# Patient Record
Sex: Female | Born: 1955 | Race: White | Hispanic: No | Smoking: Current every day smoker
Health system: Southern US, Community
[De-identification: ages and names within clinical notes are randomized; demographics above are authoritative.]

## PROBLEM LIST (undated history)

## (undated) DIAGNOSIS — K219 Gastro-esophageal reflux disease without esophagitis: Secondary | ICD-10-CM

## (undated) DIAGNOSIS — F419 Anxiety disorder, unspecified: Secondary | ICD-10-CM

## (undated) DIAGNOSIS — M810 Age-related osteoporosis without current pathological fracture: Secondary | ICD-10-CM

## (undated) DIAGNOSIS — D472 Monoclonal gammopathy: Secondary | ICD-10-CM

## (undated) DIAGNOSIS — J449 Chronic obstructive pulmonary disease, unspecified: Secondary | ICD-10-CM

## (undated) DIAGNOSIS — K8020 Calculus of gallbladder without cholecystitis without obstruction: Secondary | ICD-10-CM

## (undated) DIAGNOSIS — C801 Malignant (primary) neoplasm, unspecified: Secondary | ICD-10-CM

## (undated) HISTORY — DX: Calculus of gallbladder without cholecystitis without obstruction: K80.20

## (undated) HISTORY — DX: Monoclonal gammopathy: D47.2

## (undated) HISTORY — PX: APPENDECTOMY: SHX54

## (undated) HISTORY — DX: Anxiety disorder, unspecified: F41.9

## (undated) HISTORY — PX: ABDOMINAL HYSTERECTOMY: SHX81

## (undated) HISTORY — DX: Age-related osteoporosis without current pathological fracture: M81.0

---

## 2000-06-15 ENCOUNTER — Ambulatory Visit (HOSPITAL_COMMUNITY): Admission: RE | Admit: 2000-06-15 | Discharge: 2000-06-15 | Payer: Self-pay | Admitting: *Deleted

## 2000-06-15 ENCOUNTER — Encounter: Payer: Self-pay | Admitting: *Deleted

## 2000-10-30 ENCOUNTER — Emergency Department (HOSPITAL_COMMUNITY): Admission: EM | Admit: 2000-10-30 | Discharge: 2000-10-30 | Payer: Self-pay | Admitting: Internal Medicine

## 2000-11-21 ENCOUNTER — Encounter: Payer: Self-pay | Admitting: Obstetrics and Gynecology

## 2000-11-21 ENCOUNTER — Ambulatory Visit (HOSPITAL_COMMUNITY): Admission: RE | Admit: 2000-11-21 | Discharge: 2000-11-21 | Payer: Self-pay | Admitting: Obstetrics and Gynecology

## 2000-11-21 ENCOUNTER — Other Ambulatory Visit: Admission: RE | Admit: 2000-11-21 | Discharge: 2000-11-21 | Payer: Self-pay | Admitting: Obstetrics and Gynecology

## 2001-07-30 ENCOUNTER — Inpatient Hospital Stay (HOSPITAL_COMMUNITY): Admission: EM | Admit: 2001-07-30 | Discharge: 2001-08-01 | Payer: Self-pay | Admitting: Emergency Medicine

## 2001-07-30 ENCOUNTER — Encounter: Payer: Self-pay | Admitting: Emergency Medicine

## 2001-07-31 ENCOUNTER — Encounter: Payer: Self-pay | Admitting: General Surgery

## 2002-06-25 ENCOUNTER — Encounter: Payer: Self-pay | Admitting: Internal Medicine

## 2002-06-25 ENCOUNTER — Ambulatory Visit (HOSPITAL_COMMUNITY): Admission: RE | Admit: 2002-06-25 | Discharge: 2002-06-25 | Payer: Self-pay | Admitting: Internal Medicine

## 2002-08-27 ENCOUNTER — Encounter: Payer: Self-pay | Admitting: Internal Medicine

## 2002-08-27 ENCOUNTER — Ambulatory Visit (HOSPITAL_COMMUNITY): Admission: RE | Admit: 2002-08-27 | Discharge: 2002-08-27 | Payer: Self-pay | Admitting: Internal Medicine

## 2003-06-25 ENCOUNTER — Ambulatory Visit (HOSPITAL_COMMUNITY): Admission: RE | Admit: 2003-06-25 | Discharge: 2003-06-25 | Payer: Self-pay | Admitting: Internal Medicine

## 2003-09-27 ENCOUNTER — Ambulatory Visit (HOSPITAL_COMMUNITY): Admission: RE | Admit: 2003-09-27 | Discharge: 2003-09-27 | Payer: Self-pay | Admitting: Family Medicine

## 2003-10-01 ENCOUNTER — Ambulatory Visit (HOSPITAL_COMMUNITY): Admission: RE | Admit: 2003-10-01 | Discharge: 2003-10-01 | Payer: Self-pay | Admitting: Family Medicine

## 2004-03-05 ENCOUNTER — Ambulatory Visit (HOSPITAL_COMMUNITY): Admission: RE | Admit: 2004-03-05 | Discharge: 2004-03-05 | Payer: Self-pay | Admitting: Family Medicine

## 2004-05-07 ENCOUNTER — Ambulatory Visit (HOSPITAL_COMMUNITY): Admission: RE | Admit: 2004-05-07 | Discharge: 2004-05-07 | Payer: Self-pay | Admitting: Family Medicine

## 2004-06-19 ENCOUNTER — Emergency Department (HOSPITAL_COMMUNITY): Admission: EM | Admit: 2004-06-19 | Discharge: 2004-06-20 | Payer: Self-pay | Admitting: *Deleted

## 2004-07-03 ENCOUNTER — Ambulatory Visit: Payer: Self-pay | Admitting: *Deleted

## 2004-07-08 ENCOUNTER — Ambulatory Visit (HOSPITAL_COMMUNITY): Admission: RE | Admit: 2004-07-08 | Discharge: 2004-07-08 | Payer: Self-pay | Admitting: *Deleted

## 2004-07-08 ENCOUNTER — Ambulatory Visit: Payer: Self-pay | Admitting: *Deleted

## 2004-07-14 ENCOUNTER — Ambulatory Visit: Payer: Self-pay | Admitting: *Deleted

## 2005-02-25 ENCOUNTER — Ambulatory Visit (HOSPITAL_COMMUNITY): Admission: RE | Admit: 2005-02-25 | Discharge: 2005-02-25 | Payer: Self-pay | Admitting: Family Medicine

## 2005-03-14 ENCOUNTER — Emergency Department (HOSPITAL_COMMUNITY): Admission: EM | Admit: 2005-03-14 | Discharge: 2005-03-14 | Payer: Self-pay | Admitting: Emergency Medicine

## 2005-05-05 ENCOUNTER — Ambulatory Visit (HOSPITAL_COMMUNITY): Admission: RE | Admit: 2005-05-05 | Discharge: 2005-05-05 | Payer: Self-pay | Admitting: Family Medicine

## 2005-08-17 ENCOUNTER — Emergency Department (HOSPITAL_COMMUNITY): Admission: EM | Admit: 2005-08-17 | Discharge: 2005-08-17 | Payer: Self-pay | Admitting: Emergency Medicine

## 2005-12-28 ENCOUNTER — Ambulatory Visit (HOSPITAL_COMMUNITY): Admission: RE | Admit: 2005-12-28 | Discharge: 2005-12-28 | Payer: Self-pay | Admitting: Family Medicine

## 2006-02-09 ENCOUNTER — Ambulatory Visit (HOSPITAL_COMMUNITY): Admission: RE | Admit: 2006-02-09 | Discharge: 2006-02-09 | Payer: Self-pay | Admitting: Family Medicine

## 2006-04-18 ENCOUNTER — Ambulatory Visit (HOSPITAL_COMMUNITY): Admission: RE | Admit: 2006-04-18 | Discharge: 2006-04-18 | Payer: Self-pay | Admitting: Family Medicine

## 2006-05-05 ENCOUNTER — Ambulatory Visit (HOSPITAL_COMMUNITY): Admission: RE | Admit: 2006-05-05 | Discharge: 2006-05-05 | Payer: Self-pay | Admitting: Family Medicine

## 2006-09-14 ENCOUNTER — Ambulatory Visit (HOSPITAL_COMMUNITY): Admission: RE | Admit: 2006-09-14 | Discharge: 2006-09-14 | Payer: Self-pay | Admitting: Family Medicine

## 2006-09-19 ENCOUNTER — Emergency Department (HOSPITAL_COMMUNITY): Admission: EM | Admit: 2006-09-19 | Discharge: 2006-09-19 | Payer: Self-pay | Admitting: Emergency Medicine

## 2007-03-16 ENCOUNTER — Ambulatory Visit (HOSPITAL_COMMUNITY): Admission: RE | Admit: 2007-03-16 | Discharge: 2007-03-16 | Payer: Self-pay | Admitting: Internal Medicine

## 2007-03-27 ENCOUNTER — Ambulatory Visit (HOSPITAL_COMMUNITY): Admission: RE | Admit: 2007-03-27 | Discharge: 2007-03-27 | Payer: Self-pay | Admitting: Internal Medicine

## 2007-07-27 ENCOUNTER — Ambulatory Visit (HOSPITAL_COMMUNITY): Admission: RE | Admit: 2007-07-27 | Discharge: 2007-07-27 | Payer: Self-pay | Admitting: Family Medicine

## 2007-12-11 ENCOUNTER — Ambulatory Visit (HOSPITAL_COMMUNITY): Admission: RE | Admit: 2007-12-11 | Discharge: 2007-12-11 | Payer: Self-pay | Admitting: Family Medicine

## 2008-07-05 ENCOUNTER — Ambulatory Visit (HOSPITAL_COMMUNITY): Admission: RE | Admit: 2008-07-05 | Discharge: 2008-07-05 | Payer: Self-pay | Admitting: Family Medicine

## 2008-08-26 ENCOUNTER — Encounter (HOSPITAL_COMMUNITY): Admission: RE | Admit: 2008-08-26 | Discharge: 2008-09-25 | Payer: Self-pay | Admitting: Oncology

## 2008-08-26 ENCOUNTER — Ambulatory Visit (HOSPITAL_COMMUNITY): Payer: Self-pay | Admitting: Oncology

## 2008-08-27 ENCOUNTER — Encounter (HOSPITAL_COMMUNITY): Payer: Self-pay | Admitting: Oncology

## 2008-08-28 ENCOUNTER — Ambulatory Visit (HOSPITAL_COMMUNITY): Admission: RE | Admit: 2008-08-28 | Discharge: 2008-08-28 | Payer: Self-pay | Admitting: Oncology

## 2008-09-05 ENCOUNTER — Ambulatory Visit (HOSPITAL_COMMUNITY): Admission: RE | Admit: 2008-09-05 | Discharge: 2008-09-05 | Payer: Self-pay | Admitting: Oncology

## 2009-02-24 ENCOUNTER — Ambulatory Visit (HOSPITAL_COMMUNITY): Payer: Self-pay | Admitting: Oncology

## 2009-02-24 ENCOUNTER — Encounter (HOSPITAL_COMMUNITY): Admission: RE | Admit: 2009-02-24 | Discharge: 2009-03-26 | Payer: Self-pay | Admitting: Oncology

## 2009-03-02 ENCOUNTER — Emergency Department (HOSPITAL_COMMUNITY): Admission: EM | Admit: 2009-03-02 | Discharge: 2009-03-02 | Payer: Self-pay | Admitting: Emergency Medicine

## 2009-05-12 ENCOUNTER — Emergency Department (HOSPITAL_COMMUNITY): Admission: EM | Admit: 2009-05-12 | Discharge: 2009-05-12 | Payer: Self-pay | Admitting: Emergency Medicine

## 2009-06-19 ENCOUNTER — Emergency Department (HOSPITAL_COMMUNITY): Admission: EM | Admit: 2009-06-19 | Discharge: 2009-06-19 | Payer: Self-pay | Admitting: Emergency Medicine

## 2009-07-04 ENCOUNTER — Emergency Department (HOSPITAL_COMMUNITY): Admission: EM | Admit: 2009-07-04 | Discharge: 2009-07-04 | Payer: Self-pay | Admitting: Emergency Medicine

## 2009-11-16 ENCOUNTER — Emergency Department (HOSPITAL_COMMUNITY): Admission: EM | Admit: 2009-11-16 | Discharge: 2009-11-16 | Payer: Self-pay | Admitting: Emergency Medicine

## 2009-11-28 ENCOUNTER — Encounter (HOSPITAL_COMMUNITY)
Admission: RE | Admit: 2009-11-28 | Discharge: 2009-12-28 | Payer: Self-pay | Source: Home / Self Care | Attending: Oncology | Admitting: Oncology

## 2009-11-28 ENCOUNTER — Ambulatory Visit (HOSPITAL_COMMUNITY): Payer: Self-pay | Admitting: Oncology

## 2010-03-31 LAB — PROTEIN ELECTROPH W RFLX QUANT IMMUNOGLOBULINS
M-Spike, %: 1.51 g/dL
Total Protein ELP: 7.8 g/dL (ref 6.0–8.3)

## 2010-03-31 LAB — UIFE/LIGHT CHAINS/TP QN, 24-HR UR
Alpha 1, Urine: DETECTED — AB
Alpha 2, Urine: DETECTED — AB
Free Kappa/Lambda Ratio: 16.7 ratio — ABNORMAL HIGH (ref 0.46–4.00)
Total Protein, Urine: 2.3 mg/dL
Volume, Urine: 850 mL

## 2010-03-31 LAB — IGG, IGA, IGM
IgA: 57 mg/dL — ABNORMAL LOW (ref 68–378)
IgM, Serum: 52 mg/dL — ABNORMAL LOW (ref 60–263)

## 2010-03-31 LAB — CBC
MCH: 31.9 pg (ref 26.0–34.0)
MCHC: 34.2 g/dL (ref 30.0–36.0)
MCV: 93.3 fL (ref 78.0–100.0)
Platelets: 346 10*3/uL (ref 150–400)

## 2010-03-31 LAB — DIFFERENTIAL
Basophils Relative: 1 % (ref 0–1)
Eosinophils Absolute: 0.4 10*3/uL (ref 0.0–0.7)
Lymphocytes Relative: 33 % (ref 12–46)
Lymphs Abs: 3 10*3/uL (ref 0.7–4.0)
Monocytes Absolute: 0.5 10*3/uL (ref 0.1–1.0)
Neutro Abs: 5.1 10*3/uL (ref 1.7–7.7)

## 2010-03-31 LAB — COMPREHENSIVE METABOLIC PANEL
AST: 22 U/L (ref 0–37)
Albumin: 3.8 g/dL (ref 3.5–5.2)
BUN: 11 mg/dL (ref 6–23)
Calcium: 9 mg/dL (ref 8.4–10.5)
Chloride: 106 mEq/L (ref 96–112)
Creatinine, Ser: 0.76 mg/dL (ref 0.4–1.2)
GFR calc Af Amer: >60 mL/min (ref >60)
GFR calc non Af Amer: >60 mL/min (ref >60)
Total Bilirubin: 0.6 mg/dL (ref 0.3–1.2)

## 2010-03-31 LAB — IMMUNOFIXATION ELECTROPHORESIS
IgA: 58 mg/dL — ABNORMAL LOW (ref 68–378)
IgG (Immunoglobin G), Serum: 2060 mg/dL — ABNORMAL HIGH (ref 694–1618)
IgM, Serum: 47 mg/dL — ABNORMAL LOW (ref 60–263)
Total Protein ELP: 7.9 g/dL (ref 6.0–8.3)

## 2010-03-31 LAB — IGG: IgG (Immunoglobin G), Serum: 1920 mg/dL — ABNORMAL HIGH (ref 694–1618)

## 2010-04-05 LAB — BASIC METABOLIC PANEL
BUN: 8 mg/dL (ref 6–23)
Calcium: 9.7 mg/dL (ref 8.4–10.5)
GFR calc non Af Amer: >60 mL/min (ref >60)
Potassium: 3.4 mEq/L — ABNORMAL LOW (ref 3.5–5.1)
Sodium: 138 mEq/L (ref 135–145)

## 2010-04-05 LAB — CBC
HCT: 43.4 % (ref 36.0–46.0)
Hemoglobin: 14.6 g/dL (ref 12.0–15.0)
Platelets: 322 10*3/uL (ref 150–400)
WBC: 12.2 10*3/uL — ABNORMAL HIGH (ref 4.0–10.5)

## 2010-04-05 LAB — DIFFERENTIAL
Eosinophils Relative: 7 % — ABNORMAL HIGH (ref 0–5)
Lymphocytes Relative: 31 % (ref 12–46)
Lymphs Abs: 3.8 10*3/uL (ref 0.7–4.0)
Neutro Abs: 6.6 10*3/uL (ref 1.7–7.7)

## 2010-04-07 LAB — COMPREHENSIVE METABOLIC PANEL
Albumin: 3.4 g/dL — ABNORMAL LOW (ref 3.5–5.2)
Alkaline Phosphatase: 89 U/L (ref 39–117)
BUN: 7 mg/dL (ref 6–23)
CO2: 25 mEq/L (ref 19–32)
Chloride: 99 mEq/L (ref 96–112)
Creatinine, Ser: 0.68 mg/dL (ref 0.4–1.2)
GFR calc non Af Amer: >60 mL/min (ref >60)
Glucose, Bld: 93 mg/dL (ref 70–99)
Potassium: 3.5 mEq/L (ref 3.5–5.1)
Total Bilirubin: 0.5 mg/dL (ref 0.3–1.2)

## 2010-04-07 LAB — CULTURE, BLOOD (ROUTINE X 2)
Culture: NO GROWTH
Report Status: 4302011

## 2010-04-07 LAB — POCT CARDIAC MARKERS
CKMB, poc: <1 ng/mL — ABNORMAL LOW (ref 1.0–8.0)
Myoglobin, poc: 27.5 ng/mL (ref 12–200)
Troponin i, poc: <0.05 ng/mL (ref 0.00–0.09)

## 2010-04-07 LAB — CBC
HCT: 38.8 % (ref 36.0–46.0)
Hemoglobin: 13.8 g/dL (ref 12.0–15.0)
MCV: 93.8 fL (ref 78.0–100.0)
Platelets: 251 10*3/uL (ref 150–400)
WBC: 14.1 10*3/uL — ABNORMAL HIGH (ref 4.0–10.5)

## 2010-04-07 LAB — DIFFERENTIAL
Basophils Absolute: 0.1 10*3/uL (ref 0.0–0.1)
Basophils Relative: 1 % (ref 0–1)
Lymphocytes Relative: 14 % (ref 12–46)
Neutro Abs: 11.1 10*3/uL — ABNORMAL HIGH (ref 1.7–7.7)
Neutrophils Relative %: 78 % — ABNORMAL HIGH (ref 43–77)

## 2010-04-08 LAB — BASIC METABOLIC PANEL
Calcium: 9.1 mg/dL (ref 8.4–10.5)
GFR calc Af Amer: >60 mL/min (ref >60)
GFR calc non Af Amer: >60 mL/min (ref >60)
Glucose, Bld: 95 mg/dL (ref 70–99)
Potassium: 3.7 mEq/L (ref 3.5–5.1)
Sodium: 138 mEq/L (ref 135–145)

## 2010-04-08 LAB — DIFFERENTIAL
Basophils Absolute: 0.1 10*3/uL (ref 0.0–0.1)
Basophils Absolute: 0.1 10*3/uL (ref 0.0–0.1)
Basophils Relative: 1 % (ref 0–1)
Eosinophils Absolute: 0.7 10*3/uL (ref 0.0–0.7)
Eosinophils Relative: 7 % — ABNORMAL HIGH (ref 0–5)
Lymphocytes Relative: 29 % (ref 12–46)
Lymphs Abs: 2.7 10*3/uL (ref 0.7–4.0)
Monocytes Absolute: 0.4 10*3/uL (ref 0.1–1.0)
Monocytes Absolute: 0.6 10*3/uL (ref 0.1–1.0)
Monocytes Relative: 7 % (ref 3–12)
Monocytes Relative: 7 % (ref 3–12)
Neutro Abs: 2.5 10*3/uL (ref 1.7–7.7)
Neutro Abs: 5.2 10*3/uL (ref 1.7–7.7)
Neutrophils Relative %: 39 % — ABNORMAL LOW (ref 43–77)

## 2010-04-08 LAB — CBC
HCT: 39 % (ref 36.0–46.0)
HCT: 40 % (ref 36.0–46.0)
Hemoglobin: 13.4 g/dL (ref 12.0–15.0)
Hemoglobin: 13.6 g/dL (ref 12.0–15.0)
RBC: 4.14 MIL/uL (ref 3.87–5.11)
RBC: 4.21 MIL/uL (ref 3.87–5.11)
RDW: 14 % (ref 11.5–15.5)
WBC: 6.5 10*3/uL (ref 4.0–10.5)
WBC: 9.3 10*3/uL (ref 4.0–10.5)

## 2010-04-08 LAB — IMMUNOFIXATION ADD-ON

## 2010-04-08 LAB — IMMUNOFIXATION ELECTROPHORESIS: IgA: 62 mg/dL — ABNORMAL LOW (ref 68–378)

## 2010-04-08 LAB — COMPREHENSIVE METABOLIC PANEL
ALT: 15 U/L (ref 0–35)
Albumin: 3.8 g/dL (ref 3.5–5.2)
Alkaline Phosphatase: 72 U/L (ref 39–117)
BUN: 11 mg/dL (ref 6–23)
Chloride: 103 mEq/L (ref 96–112)
Glucose, Bld: 77 mg/dL (ref 70–99)
Potassium: 4 mEq/L (ref 3.5–5.1)
Sodium: 138 mEq/L (ref 135–145)
Total Bilirubin: 0.6 mg/dL (ref 0.3–1.2)

## 2010-04-08 LAB — IGG, IGA, IGM
IgG (Immunoglobin G), Serum: 1920 mg/dL — ABNORMAL HIGH (ref 694–1618)
IgM, Serum: 49 mg/dL — ABNORMAL LOW (ref 60–263)

## 2010-04-08 LAB — PROTEIN ELECTROPH W RFLX QUANT IMMUNOGLOBULINS
Alpha-1-Globulin: 4.6 % (ref 2.9–4.9)
Beta 2: 3.2 % (ref 3.2–6.5)
Gamma Globulin: 22.5 % — ABNORMAL HIGH (ref 11.1–18.8)
M-Spike, %: 1.38 g/dL

## 2010-04-25 LAB — COMPREHENSIVE METABOLIC PANEL WITH GFR
ALT: 14 U/L (ref 0–35)
AST: 19 U/L (ref 0–37)
Albumin: 3.8 g/dL (ref 3.5–5.2)
Alkaline Phosphatase: 68 U/L (ref 39–117)
BUN: 8 mg/dL (ref 6–23)
CO2: 25 meq/L (ref 19–32)
Calcium: 9.4 mg/dL (ref 8.4–10.5)
Chloride: 107 meq/L (ref 96–112)
Creatinine, Ser: 0.61 mg/dL (ref 0.4–1.2)
GFR calc Af Amer: >60 mL/min (ref >60)
GFR calc non Af Amer: >60 mL/min (ref >60)
Glucose, Bld: 82 mg/dL (ref 70–99)
Potassium: 3.5 meq/L (ref 3.5–5.1)
Sodium: 137 meq/L (ref 135–145)
Total Bilirubin: 0.4 mg/dL (ref 0.3–1.2)
Total Protein: 7.5 g/dL (ref 6.0–8.3)

## 2010-04-25 LAB — PROTEIN ELECTROPH W RFLX QUANT IMMUNOGLOBULINS
Albumin ELP: 55.6 % — ABNORMAL LOW (ref 55.8–66.1)
Alpha-1-Globulin: 4.4 % (ref 2.9–4.9)
Alpha-2-Globulin: 9.3 % (ref 7.1–11.8)
Total Protein ELP: 7.3 g/dL (ref 6.0–8.3)

## 2010-04-25 LAB — CREATININE CLEARANCE, URINE, 24 HOUR
Collection Interval-CRCL: 24 h
Creatinine Clearance: 109 mL/min (ref 75–115)
Creatinine, 24H Ur: 962 mg/d (ref 700–1800)
Creatinine, Urine: 120.21 mg/dL
Creatinine: 0.61 mg/dL (ref 0.4–1.2)
Urine Total Volume-CRCL: 800 mL

## 2010-04-25 LAB — IMMUNOFIXATION ELECTROPHORESIS
IgA: 57 mg/dL — ABNORMAL LOW (ref 68–378)
IgM, Serum: 52 mg/dL — ABNORMAL LOW (ref 60–263)

## 2010-04-25 LAB — UIFE/LIGHT CHAINS/TP QN, 24-HR UR
Albumin, U: DETECTED
Free Kappa Lt Chains,Ur: 0.81 mg/dL (ref 0.04–1.51)
Free Lambda Excretion/Day: 0.88 mg/d
Free Lambda Lt Chains,Ur: 0.11 mg/dL (ref 0.08–1.01)
Free Lt Chn Excr Rate: 6.48 mg/d
Gamma Globulin, Urine: DETECTED — AB
Time: 24 hours
Total Protein, Urine-Ur/day: 11 mg/d (ref 10–140)

## 2010-04-25 LAB — BETA 2 MICROGLOBULIN, SERUM: Beta-2 Microglobulin: 2.3 mg/L — ABNORMAL HIGH (ref 1.01–1.73)

## 2010-04-25 LAB — DIFFERENTIAL
Basophils Absolute: 0.1 10*3/uL (ref 0.0–0.1)
Basophils Relative: 1 % (ref 0–1)
Eosinophils Absolute: 0.7 10*3/uL (ref 0.0–0.7)
Monocytes Absolute: 0.4 10*3/uL (ref 0.1–1.0)
Monocytes Relative: 6 % (ref 3–12)

## 2010-04-25 LAB — CBC
HCT: 39.4 % (ref 36.0–46.0)
Hemoglobin: 14 g/dL (ref 12.0–15.0)
MCHC: 35.5 g/dL (ref 30.0–36.0)
MCV: 92.6 fL (ref 78.0–100.0)
Platelets: 231 K/uL (ref 150–400)
RBC: 4.26 MIL/uL (ref 3.87–5.11)
RDW: 13.8 % (ref 11.5–15.5)
WBC: 5.9 K/uL (ref 4.0–10.5)

## 2010-04-25 LAB — IGG, IGA, IGM
IgA: 56 mg/dL — ABNORMAL LOW (ref 68–378)
IgG (Immunoglobin G), Serum: 1990 mg/dL — ABNORMAL HIGH (ref 694–1618)
IgM, Serum: 51 mg/dL — ABNORMAL LOW (ref 60–263)

## 2010-04-25 LAB — TISSUE HYBRIDIZATION (BONE MARROW)-NCBH

## 2010-04-25 LAB — BONE MARROW EXAM

## 2010-05-07 ENCOUNTER — Other Ambulatory Visit (HOSPITAL_COMMUNITY): Payer: Self-pay | Admitting: Internal Medicine

## 2010-05-07 ENCOUNTER — Encounter (HOSPITAL_COMMUNITY): Payer: Self-pay

## 2010-05-07 ENCOUNTER — Ambulatory Visit (HOSPITAL_COMMUNITY)
Admission: RE | Admit: 2010-05-07 | Discharge: 2010-05-07 | Disposition: A | Discharge status: Home / Self Care | Payer: PRIVATE HEALTH INSURANCE | Source: Ambulatory Visit | Attending: Internal Medicine | Admitting: Internal Medicine

## 2010-05-07 DIAGNOSIS — J069 Acute upper respiratory infection, unspecified: Secondary | ICD-10-CM

## 2010-05-07 DIAGNOSIS — R05 Cough: Secondary | ICD-10-CM

## 2010-05-07 DIAGNOSIS — J4489 Other specified chronic obstructive pulmonary disease: Secondary | ICD-10-CM | POA: Insufficient documentation

## 2010-05-07 DIAGNOSIS — J449 Chronic obstructive pulmonary disease, unspecified: Secondary | ICD-10-CM | POA: Insufficient documentation

## 2010-05-07 DIAGNOSIS — R059 Cough, unspecified: Secondary | ICD-10-CM

## 2010-05-07 DIAGNOSIS — F17200 Nicotine dependence, unspecified, uncomplicated: Secondary | ICD-10-CM | POA: Insufficient documentation

## 2010-05-07 HISTORY — DX: Chronic obstructive pulmonary disease, unspecified: J44.9

## 2010-06-05 NOTE — Procedures (Signed)
NAMENEIMA, LACROSS              ACCOUNT NO.:  [id number]   MEDICAL RECORD NO.:  [id number]          PATIENT TYPE:  OUT   LOCATION:  RESP                          FACILITY:  [facility]   PHYSICIAN:  Edward L. Juanetta Gosling, M.D.DATE OF BIRTH:  March 19, 1955   DATE OF PROCEDURE:  DATE OF DISCHARGE:                            PULMONARY FUNCTION TEST   IMPRESSION:  1. Spirometry shows no definite ventilatory defect, but evidence of      airflow obstruction.  2. Lung volumes show air-trapping.  3. Diffusion capacity of carbon monoxide is normal.  4. Arterial blood gases are normal.  5. There is no significant bronchodilator effect.  6. This is consistent with the clinical diagnosis of chronic      bronchitis/chronic obstructive pulmonary disease.      Edward L. Juanetta Gosling, M.D.  Electronically Signed     ELH/MEDQ  D:  05/05/2006  T:  05/06/2006  Job:  [id number]   cc:   Jeoffrey Massed, MD  Fax: [phone]

## 2010-06-05 NOTE — Procedures (Signed)
NAME:  Misty Richardson, Misty Richardson              ACCOUNT NO.:  [id number]   MEDICAL RECORD NO.:  [id number]          PATIENT TYPE:  OUT   LOCATION:  RAD                           FACILITY:  [facility]   PHYSICIAN:  Vida Roller, M.D.   DATE OF BIRTH:  [birth year]   DATE OF PROCEDURE:  DATE OF DISCHARGE:                                    STRESS TEST   HISTORY:  Misty Richardson is a 55 year old female with no known coronary artery  disease, with atypical chest pain in the setting of a nicotine patch.  Cardiac risk factors include tobacco abuse.   BASELINE DATA:  Electrocardiogram reveals sinus rhythm at 85 beats per  minute, poor R-wave progression is noted.  Blood pressure is 112/64.   Patient exercised for a total of 11 minutes to Bruce protocol stage IV and  achieved 13.2 mets.  Maximum heart rate was 142 beats per minute which is  83% of predicted maximum.  Maximum blood pressure is 158/62 and resolved  down to 112/58 in recovery.  EKG revealed no ischemic changes and a few PACs  were noted.  The patient reported shortness of breath during exercise that  resolved in recovery.  No chest pain was noted.    Echocardiographic images and final results are pending MD review.       AB/MEDQ  D:  07/08/2004  T:  07/08/2004  Job:  [id number]

## 2010-06-05 NOTE — Discharge Summary (Signed)
[facility]  Patient:    Misty Richardson, Misty Richardson Visit Number: [id number] MRN: [id number]          Service Type: MED Location: 3A A213 01 Attending Physician:  Dessa Phi Dictated by:   Elpidio Anis, M.D. Admit Date:  07/30/2001 Discharge Date: 08/01/2001                             Discharge Summary  DISCHARGE DIAGNOSES: 1. Acute appendicitis. 2. Mild anxiety. 3. History of migraine headaches.  SPECIAL PROCEDURE:  Laparoscopic appendectomy on July 31, 2001.  DISPOSITION:  The patient discharged to home in stable satisfactory condition.  DISCHARGE MEDICATIONS: 1. Tylox 1 or 2 every four hours if needed for pain. 2. Levaquin 500 mg once daily for seven days. 3. Phenergan 25 mg every four hours if needed for nausea.  SUMMARY:  A 55 year old female with a history of acute onset of abdominal pain at 0430 on the day of admission. She did not have nausea, vomiting, or diarrhea. The pain initially was across her abdomen and later localized to the right lower quadrant and right flank. It became increasingly severe. She was evaluated in the emergency room and was found to have changes on CT suggestive of appendicitis. She was admitted and scheduled for an appendectomy.  PAST MEDICAL HISTORY:  She has no major chronic medical illness. She has a history of migraine headaches and situational stress.  PAST SURGICAL HISTORY:  Include hysterectomy and C-section.  PHYSICAL EXAMINATION:  VITAL SIGNS:  Bp 90/50, pulse 57, respiration 22, temperature 98 degrees.  GENERAL:  Her general physical exam was entirely unremarkable except as related to her abdomen.  ABDOMEN:  Her abdomen was mildly distended with tenderness, guarding, and rebound in the right lower quadrant and extending to the right flank. She had excellent bowel sounds.  LABORATORY DATA:  White count 13,400, hemoglobin 13.6, hematocrit 38.4 with 78 segs, 14 lymphs, 5 monocytes. Basic metabolic  panel normal except for potassium at 3.4. Urine analysis was normal.  HOSPITAL COURSE:  The patient was counseled for laparoscopic appendectomy. This was done on July 31, 2001. Retrocecal appendix with an acutely inflamed distal tip was noted. Routine laparoscopic appendectomy was done without difficulty. The patient tolerated the procedure well. She has done well in the postoperative course. She has not had nausea, vomiting, or diarrhea. Incisions look good. She has passed flatus without difficulty and is tolerating her diet. She is stable enough for discharge. She is discharged home in stable satisfactory condition.  FOLLOWUP:  In the office on Thursday of next week. Dictated by:   Elpidio Anis, M.D. Attending Physician:  Dessa Phi DD:  08/01/01 TD:  08/03/01 Job: [id number]

## 2010-06-05 NOTE — H&P (Signed)
[facility]  Patient:    Misty Richardson, Misty Richardson Visit Number: [id number] MRN: [id number]          Service Type: MED Location: 3A L244 01 Attending Physician:  Herbert Seta Dictated by:   Elpidio Anis, M.D. Admit Date:  07/30/2001                           History and Physical  HISTORY OF PRESENT ILLNESS:  A 55 year old female with a history of acute onset of abdominal pain about 4:30 this morning.  She has not had nausea, vomiting or diarrhea.  The pain initially was across her abdomen and later localized to the right lower quadrant and right flank.  The pain is much more severe at this time.  CT shows changes of appendicitis and she is scheduled for laparoscopic appendectomy later today.  PAST MEDICAL HISTORY:  She has no major chronic medical illness.  She does have some migraine headaches and she has situational stress related to recent loss of her job and marital separation.  PAST SURGICAL HISTORY:  Total abdominal hysterectomy and cesarean section times two.  CHRONIC MEDICATIONS:  None.  ALLERGIES:  None.  SOCIAL HISTORY:  She is separated, unemployed.  She smokes 1-1/2 packs of cigarettes per day.  She drinks an occasional beer.  She does not use drugs.  FAMILY HISTORY:  Positive for hypertension, kidney stones, colon cancer, atherosclerotic heart disease and carcinoma of the esophagus.  REVIEW OF SYSTEMS:  Positive for chronic anxiety and headaches recently.  PHYSICAL EXAMINATION:  GENERAL:  She is in no acute distress.  VITAL SIGNS:  Blood pressure 90/50, pulse 57, respirations 22.  HEENT:  Unremarkable.  NECK:  Supple.  No JVD or bruit.  No adenopathy.  CHEST:  A few basilar rales.  No rhonchi or wheezes.  Very good air movement. Rales clear with cough.  HEART:  Regular rate and rhythm.  No murmur, gallop or rub.  ABDOMEN:  Mildly distended with tenderness, guarding and rebound in the right lower quadrant and extending to  the right flank.  She has excellent bowel sounds.  Left side and suprapubic area is benign to palpation.  EXTREMITIES:  No clubbing, cyanosis or edema.  She has good pulses.  No peripheral edema.  NEUROLOGIC:  No focal, motor, sensory or cerebellar deficit.  IMPRESSION:  Acute abdominal pain with changes suggestive of appendicitis.  PLAN:  Laparoscopic appendectomy as soon as possible. Dictated by:   Elpidio Anis, M.D. Attending Physician:  Herbert Seta DD:  07/31/01 TD:  07/31/01 Job: [id number]

## 2010-06-05 NOTE — Procedures (Signed)
NAMESHANNIA, JACUINDE              ACCOUNT NO.:  [id number]   MEDICAL RECORD NO.:  [id number]          PATIENT TYPE:  OUT   LOCATION:  RAD                           FACILITY:  [facility]   PHYSICIAN:  Vida Roller, M.D.   DATE OF BIRTH:  Nov 08, 1955   DATE OF PROCEDURE:  DATE OF DISCHARGE:                                  ECHOCARDIOGRAM   HISTORY OF PRESENT ILLNESS:  [name] a 54 year old woman with  exertional chest discomfort. She has no known coronary artery disease.  She  has cardiac risk factors of tobacco abuse.   DETAILS OF THE PROCEDURE:  The patient was brought to the echocardiographic  lab where echocardiographic images were obtained in the apical 4, apical 2,  parasternal long and parasternal short axis views.  These views were kept on  cine loop format, as well as tape. The patient then underwent an exercise  Bruce protocol, and when she had attained a least 85% of her maximum  predicted heart rate for age, the patient was then brought immediately back  to the echocardiographic lab where the same images were repeated.  Comparison was then used to assess for wall motion abnormality. The patient  was then recovered as appropriate for Bruce protocol.   RESULTS:  Rest images reveal a normal LV systolic function with no  significant wall motion abnormalities, normal left ventricular size.   Post stress images reveal appropriate augmentation of systolic function, no  evidence of wall motion abnormality, appropriate augmentation of left  ventricular wall thickening.   STRESS TEST:  The patient exercised 11 minutes and 6 seconds of a Bruce  protocol, attaining 13 minutes of exercise.  Her heart rate increased from  83 beats a minute to 142 beats a minute, which is 85% of her maximum  predicted heart rate for her age. Her blood pressure went from 112/64 to  158/62.  During that time, she experienced mild shortness of breath but no  chest discomfort. She had an occasional PAC  but no ischemic ST-T wave  changes.   INTERPRETATION:  This is a low risk stress echo in a patient with no known  coronary artery disease. There appears to be no evidence of ischemia or  scar. Clinical correlation is advised.       JH/MEDQ  D:  07/08/2004  T:  07/08/2004  Job:  [id number]   cc:   Corrie Mckusick, M.D.  Fax: [phone]

## 2010-06-05 NOTE — Procedures (Signed)
NAMESHARMON, Misty Richardson              ACCOUNT NO.:  [id number]   MEDICAL RECORD NO.:  [id number]          PATIENT TYPE:  OUT   LOCATION:  RAD                           FACILITY:  [facility]   PHYSICIAN:  Vida Roller, M.D.   DATE OF BIRTH:  02/05/1955   DATE OF PROCEDURE:  07/08/2004  DATE OF DISCHARGE:                                  ECHOCARDIOGRAM   TAPE NUMBER:  [id number].   TAPE COUNT:  [id number] through 3631.   HISTORY:  This is a 54 year old woman with chest discomfort.  No previous  cardiac history.  No previous studies.   The technical quality of the study is difficult.   M-MODE TRACINGS:  The aorta is 27 mm.   The left atrium is 26 mm.   The septum is 10 mm.   The posterior wall is 10 mm.   Left ventricular diastolic dimension is 33 mm.   Left ventricular systolic dimension is 24 mm.   2-D AND DOPPLER IMAGING:  The left ventricle is normal size with normal  systolic function.  There are no wall motion abnormalities seen.  Estimated  ejection fraction of 55% to 60%.   The right ventricle is normal size with normal systolic function.   Both atria are normal size.   The aortic valve is morphologically unremarkable, though not terribly well  seen.  There is no stenosis or regurgitation seen.   The mitral valve is morphologically unremarkable with no stenosis.  Trivial  mitral regurgitation is seen.   The tricuspid valve is morphologically unremarkable with trivial  regurgitation.   The pulmonic valve was not well seen.   The ascending aorta was not well seen.   Inferior vena cava appears to be normal size.       JH/MEDQ  D:  07/08/2004  T:  07/08/2004  Job:  [id number]   cc:   Corrie Mckusick, M.D.  Fax: [phone]

## 2010-06-05 NOTE — Op Note (Signed)
[facility]  Patient:    Misty Richardson, Misty Richardson Visit Number: [id number] MRN: [id number]          Service Type: MED Location: 3A Y782 01 Attending Physician:  Dessa Phi Dictated by:   Elpidio Anis, M.D. Admit Date:  07/30/2001 Discharge Date: 08/01/2001                             Operative Report  PREOPERATIVE DIAGNOSIS:  Acute appendicitis.  POSTOPERATIVE DIAGNOSIS:  Acute appendicitis.  PROCEDURE:  Laparoscopic appendectomy.  SURGEON:  Elpidio Anis, M.D.  DESCRIPTION OF PROCEDURE:  Under general anesthesia, the patients abdomen was prepped and draped in a sterile field.  A curvilinear supraumbilical incision was made and the Veress needle was inserted uneventfully.  Abdomen was insufflated with 2 L of CO2.  Using a Visiport guide, a 10 mm port was placed uneventfully.  Laparoscope was placed.  Under videoscopic guidance a 5 mm port was placed in the suprapubic midline and a 12 mm port was placed in the left lower quadrant.  The cecum was mobilized and the appendix was found to be retrocecal.  The base of the appendix was found.  It was dissected using blunt dissector.  Using an Endo-GIA stapler with 4.5 mm staples, the base of the appendix was transected.  The appendix was then grasped with a noncrushing clamp and placed on stretch.  The mesoappendix was serially dissected, clipped with multiple clips, and divided.  The appendix was grasped and retrieved. Irrigation was carried out.  There was no bleeding.  There was minimal purulence.  The fluid returned clear.  There was no evidence of bleeding from the mesentery.  The patient tolerated the procedure well.  CO2 was then allowed to escape from the abdomen and the ports were removed.  The incisions were closed using 0 Dexon on the fascia of the larger incisions and staples on the skin.  A dressing was placed on each incision.  She was awakened from anesthesia uneventfully, transferred to a bed,  and taken to the postanesthetic care unit. Dictated by:   Elpidio Anis, M.D. Attending Physician:  Dessa Phi DD:  07/31/01 TD:  08/03/01 Job: [id number]

## 2010-06-16 ENCOUNTER — Encounter (HOSPITAL_COMMUNITY): Payer: PRIVATE HEALTH INSURANCE | Attending: Family Medicine

## 2010-06-16 ENCOUNTER — Other Ambulatory Visit: Payer: Self-pay | Admitting: Family Medicine

## 2010-06-16 ENCOUNTER — Encounter (HOSPITAL_COMMUNITY): Payer: Self-pay | Admitting: Oncology

## 2010-06-16 DIAGNOSIS — F17200 Nicotine dependence, unspecified, uncomplicated: Secondary | ICD-10-CM | POA: Insufficient documentation

## 2010-06-16 DIAGNOSIS — D472 Monoclonal gammopathy: Secondary | ICD-10-CM | POA: Insufficient documentation

## 2010-06-16 DIAGNOSIS — M810 Age-related osteoporosis without current pathological fracture: Secondary | ICD-10-CM | POA: Insufficient documentation

## 2010-06-16 DIAGNOSIS — R894 Abnormal immunological findings in specimens from other organs, systems and tissues: Secondary | ICD-10-CM

## 2010-06-16 DIAGNOSIS — J4489 Other specified chronic obstructive pulmonary disease: Secondary | ICD-10-CM | POA: Insufficient documentation

## 2010-06-16 DIAGNOSIS — J449 Chronic obstructive pulmonary disease, unspecified: Secondary | ICD-10-CM | POA: Insufficient documentation

## 2010-06-16 LAB — CBC
MCH: 30.9 pg (ref 26.0–34.0)
MCHC: 33.3 g/dL (ref 30.0–36.0)
Platelets: 340 10*3/uL (ref 150–400)

## 2010-06-16 LAB — DIFFERENTIAL
Basophils Absolute: 0.1 10*3/uL (ref 0.0–0.1)
Basophils Relative: 1 % (ref 0–1)
Basophils Relative: 1 % (ref 0–1)
Eosinophils Absolute: 0.6 10*3/uL (ref 0.0–0.7)
Eosinophils Absolute: 0.6 10*3/uL (ref 0.0–0.7)
Eosinophils Relative: 6 % — ABNORMAL HIGH (ref 0–5)
Eosinophils Relative: 6 % — ABNORMAL HIGH (ref 0–5)
Monocytes Absolute: 0.6 10*3/uL (ref 0.1–1.0)
Monocytes Relative: 7 % (ref 3–12)
Monocytes Relative: 7 % (ref 3–12)
Neutro Abs: 3.3 10*3/uL (ref 1.7–7.7)
Neutrophils Relative %: 34 % — ABNORMAL LOW (ref 43–77)

## 2010-06-16 LAB — COMPREHENSIVE METABOLIC PANEL
ALT: 11 U/L (ref 0–35)
Albumin: 3.8 g/dL (ref 3.5–5.2)
Calcium: 10 mg/dL (ref 8.4–10.5)
Creatinine, Ser: 0.72 mg/dL (ref 0.4–1.2)
GFR calc Af Amer: >60 mL/min (ref >60)
GFR calc non Af Amer: >60 mL/min (ref >60)
Glucose, Bld: 81 mg/dL (ref 70–99)
Potassium: 4 mEq/L (ref 3.5–5.1)
Sodium: 138 mEq/L (ref 135–145)
Total Protein: 7.3 g/dL (ref 6.0–8.3)

## 2010-06-17 ENCOUNTER — Emergency Department (HOSPITAL_COMMUNITY)
Admission: EM | Admit: 2010-06-17 | Discharge: 2010-06-17 | Disposition: A | Discharge status: Home / Self Care | Payer: PRIVATE HEALTH INSURANCE | Attending: Emergency Medicine | Admitting: Emergency Medicine

## 2010-06-17 ENCOUNTER — Emergency Department (HOSPITAL_COMMUNITY): Payer: PRIVATE HEALTH INSURANCE

## 2010-06-17 DIAGNOSIS — W19XXXA Unspecified fall, initial encounter: Secondary | ICD-10-CM | POA: Insufficient documentation

## 2010-06-17 DIAGNOSIS — S0100XA Unspecified open wound of scalp, initial encounter: Secondary | ICD-10-CM | POA: Insufficient documentation

## 2010-06-17 DIAGNOSIS — Y999 Unspecified external cause status: Secondary | ICD-10-CM | POA: Insufficient documentation

## 2010-06-17 DIAGNOSIS — J449 Chronic obstructive pulmonary disease, unspecified: Secondary | ICD-10-CM | POA: Insufficient documentation

## 2010-06-17 DIAGNOSIS — Y9289 Other specified places as the place of occurrence of the external cause: Secondary | ICD-10-CM | POA: Insufficient documentation

## 2010-06-17 DIAGNOSIS — J4489 Other specified chronic obstructive pulmonary disease: Secondary | ICD-10-CM | POA: Insufficient documentation

## 2010-06-17 LAB — BETA 2 MICROGLOBULIN, SERUM: Beta-2 Microglobulin: 1.9 mg/L — ABNORMAL HIGH (ref 1.01–1.73)

## 2010-06-19 LAB — MULTIPLE MYELOMA PANEL, SERUM
Albumin ELP: 55.4 % — ABNORMAL LOW (ref 55.8–66.1)
Beta 2: 2.7 % — ABNORMAL LOW (ref 3.2–6.5)
Beta Globulin: 5.3 % (ref 4.7–7.2)
Gamma Globulin: 22.2 % — ABNORMAL HIGH (ref 11.1–18.8)
IgA: 60 mg/dL — ABNORMAL LOW (ref 69–380)
IgG (Immunoglobin G), Serum: 2000 mg/dL — ABNORMAL HIGH (ref 690–1700)
M-Spike, %: 1.41 g/dL

## 2010-06-20 ENCOUNTER — Emergency Department (HOSPITAL_COMMUNITY): Payer: PRIVATE HEALTH INSURANCE

## 2010-06-20 ENCOUNTER — Emergency Department (HOSPITAL_COMMUNITY)
Admission: EM | Admit: 2010-06-20 | Discharge: 2010-06-20 | Disposition: A | Discharge status: Home / Self Care | Payer: PRIVATE HEALTH INSURANCE | Attending: Emergency Medicine | Admitting: Emergency Medicine

## 2010-06-20 DIAGNOSIS — M542 Cervicalgia: Secondary | ICD-10-CM | POA: Insufficient documentation

## 2010-06-20 DIAGNOSIS — J449 Chronic obstructive pulmonary disease, unspecified: Secondary | ICD-10-CM | POA: Insufficient documentation

## 2010-06-20 DIAGNOSIS — R51 Headache: Secondary | ICD-10-CM | POA: Insufficient documentation

## 2010-06-20 DIAGNOSIS — Y92009 Unspecified place in unspecified non-institutional (private) residence as the place of occurrence of the external cause: Secondary | ICD-10-CM | POA: Insufficient documentation

## 2010-06-20 DIAGNOSIS — S139XXA Sprain of joints and ligaments of unspecified parts of neck, initial encounter: Secondary | ICD-10-CM | POA: Insufficient documentation

## 2010-06-20 DIAGNOSIS — W1809XA Striking against other object with subsequent fall, initial encounter: Secondary | ICD-10-CM | POA: Insufficient documentation

## 2010-06-20 DIAGNOSIS — J4489 Other specified chronic obstructive pulmonary disease: Secondary | ICD-10-CM | POA: Insufficient documentation

## 2010-06-22 LAB — KAPPA/LAMBDA LIGHT CHAINS: Kappa free light chain: 11.2 mg/dL — ABNORMAL HIGH (ref 0.33–1.94)

## 2010-06-24 ENCOUNTER — Emergency Department (HOSPITAL_COMMUNITY)
Admission: EM | Admit: 2010-06-24 | Discharge: 2010-06-24 | Disposition: A | Discharge status: Home / Self Care | Payer: PRIVATE HEALTH INSURANCE | Attending: Emergency Medicine | Admitting: Emergency Medicine

## 2010-06-24 DIAGNOSIS — S0100XA Unspecified open wound of scalp, initial encounter: Secondary | ICD-10-CM | POA: Insufficient documentation

## 2010-06-24 DIAGNOSIS — Y998 Other external cause status: Secondary | ICD-10-CM | POA: Insufficient documentation

## 2010-06-24 DIAGNOSIS — X58XXXA Exposure to other specified factors, initial encounter: Secondary | ICD-10-CM | POA: Insufficient documentation

## 2010-06-24 DIAGNOSIS — Z4802 Encounter for removal of sutures: Secondary | ICD-10-CM | POA: Insufficient documentation

## 2010-06-30 ENCOUNTER — Other Ambulatory Visit (HOSPITAL_COMMUNITY)
Admission: RE | Admit: 2010-06-30 | Discharge: 2010-06-30 | Disposition: A | Discharge status: Home / Self Care | Payer: PRIVATE HEALTH INSURANCE | Source: Ambulatory Visit | Attending: Oncology | Admitting: Oncology

## 2010-06-30 ENCOUNTER — Other Ambulatory Visit (HOSPITAL_COMMUNITY): Payer: Self-pay | Admitting: Oncology

## 2010-06-30 ENCOUNTER — Encounter (HOSPITAL_COMMUNITY): Payer: PRIVATE HEALTH INSURANCE | Attending: Oncology | Admitting: Oncology

## 2010-06-30 DIAGNOSIS — J4489 Other specified chronic obstructive pulmonary disease: Secondary | ICD-10-CM | POA: Insufficient documentation

## 2010-06-30 DIAGNOSIS — F17200 Nicotine dependence, unspecified, uncomplicated: Secondary | ICD-10-CM | POA: Insufficient documentation

## 2010-06-30 DIAGNOSIS — D472 Monoclonal gammopathy: Secondary | ICD-10-CM | POA: Insufficient documentation

## 2010-06-30 DIAGNOSIS — D473 Essential (hemorrhagic) thrombocythemia: Secondary | ICD-10-CM

## 2010-06-30 DIAGNOSIS — J449 Chronic obstructive pulmonary disease, unspecified: Secondary | ICD-10-CM | POA: Insufficient documentation

## 2010-06-30 DIAGNOSIS — E8809 Other disorders of plasma-protein metabolism, not elsewhere classified: Secondary | ICD-10-CM | POA: Insufficient documentation

## 2010-06-30 DIAGNOSIS — M810 Age-related osteoporosis without current pathological fracture: Secondary | ICD-10-CM | POA: Insufficient documentation

## 2010-06-30 LAB — DIFFERENTIAL
Basophils Absolute: 0.1 10*3/uL (ref 0.0–0.1)
Basophils Relative: 1 % (ref 0–1)
Eosinophils Absolute: 0.5 10*3/uL (ref 0.0–0.7)
Eosinophils Relative: 6 % — ABNORMAL HIGH (ref 0–5)
Monocytes Absolute: 0.4 10*3/uL (ref 0.1–1.0)

## 2010-06-30 LAB — CBC
HCT: 37.5 % (ref 36.0–46.0)
MCH: 31.8 pg (ref 26.0–34.0)
MCHC: 33.9 g/dL (ref 30.0–36.0)
MCV: 93.8 fL (ref 78.0–100.0)
Platelets: 420 10*3/uL — ABNORMAL HIGH (ref 150–400)
RDW: 14.3 % (ref 11.5–15.5)
WBC: 7.6 10*3/uL (ref 4.0–10.5)

## 2010-07-20 ENCOUNTER — Encounter (HOSPITAL_COMMUNITY): Payer: PRIVATE HEALTH INSURANCE | Attending: Oncology | Admitting: Oncology

## 2010-07-20 DIAGNOSIS — F17200 Nicotine dependence, unspecified, uncomplicated: Secondary | ICD-10-CM | POA: Insufficient documentation

## 2010-07-20 DIAGNOSIS — M810 Age-related osteoporosis without current pathological fracture: Secondary | ICD-10-CM | POA: Insufficient documentation

## 2010-07-20 DIAGNOSIS — J449 Chronic obstructive pulmonary disease, unspecified: Secondary | ICD-10-CM | POA: Insufficient documentation

## 2010-07-20 DIAGNOSIS — J4489 Other specified chronic obstructive pulmonary disease: Secondary | ICD-10-CM | POA: Insufficient documentation

## 2010-07-20 DIAGNOSIS — D472 Monoclonal gammopathy: Secondary | ICD-10-CM | POA: Insufficient documentation

## 2010-07-25 ENCOUNTER — Emergency Department (HOSPITAL_COMMUNITY): Payer: PRIVATE HEALTH INSURANCE

## 2010-07-25 ENCOUNTER — Emergency Department (HOSPITAL_COMMUNITY)
Admission: EM | Admit: 2010-07-25 | Discharge: 2010-07-25 | Disposition: A | Discharge status: Home / Self Care | Payer: PRIVATE HEALTH INSURANCE | Attending: Emergency Medicine | Admitting: Emergency Medicine

## 2010-07-25 ENCOUNTER — Encounter (HOSPITAL_COMMUNITY): Payer: Self-pay | Admitting: *Deleted

## 2010-07-25 DIAGNOSIS — R05 Cough: Secondary | ICD-10-CM | POA: Insufficient documentation

## 2010-07-25 DIAGNOSIS — J441 Chronic obstructive pulmonary disease with (acute) exacerbation: Secondary | ICD-10-CM | POA: Insufficient documentation

## 2010-07-25 DIAGNOSIS — R0602 Shortness of breath: Secondary | ICD-10-CM | POA: Insufficient documentation

## 2010-07-25 DIAGNOSIS — F17200 Nicotine dependence, unspecified, uncomplicated: Secondary | ICD-10-CM | POA: Insufficient documentation

## 2010-07-25 DIAGNOSIS — R059 Cough, unspecified: Secondary | ICD-10-CM | POA: Insufficient documentation

## 2010-07-25 LAB — BASIC METABOLIC PANEL
BUN: 9 mg/dL (ref 6–23)
Chloride: 105 mEq/L (ref 96–112)
GFR calc Af Amer: >60 mL/min (ref >60)
GFR calc non Af Amer: >60 mL/min (ref >60)
Potassium: 3.3 mEq/L — ABNORMAL LOW (ref 3.5–5.1)

## 2010-07-25 LAB — CBC
HCT: 38.1 % (ref 36.0–46.0)
MCHC: 33.9 g/dL (ref 30.0–36.0)
Platelets: 232 10*3/uL (ref 150–400)
RDW: 14 % (ref 11.5–15.5)
WBC: 11.5 10*3/uL — ABNORMAL HIGH (ref 4.0–10.5)

## 2010-07-25 MED ORDER — IPRATROPIUM BROMIDE 0.02 % IN SOLN: 0.5000 mg | Freq: Once | RESPIRATORY_TRACT | Status: DC

## 2010-07-25 MED ORDER — PREDNISONE 10 MG PO TABS: 60.0000 mg | ORAL_TABLET | Freq: Every day | ORAL | Status: AC

## 2010-07-25 MED ORDER — IPRATROPIUM BROMIDE 0.02 % IN SOLN
RESPIRATORY_TRACT | Status: AC
  Filled 2010-07-25: qty 2.5

## 2010-07-25 MED ORDER — PREDNISONE 20 MG PO TABS
ORAL_TABLET | ORAL | Status: AC
  Filled 2010-07-25: qty 3

## 2010-07-25 MED ORDER — ALBUTEROL SULFATE (2.5 MG/3ML) 0.083% IN NEBU
INHALATION_SOLUTION | RESPIRATORY_TRACT | Status: AC
  Filled 2010-07-25: qty 6

## 2010-07-25 MED ORDER — ALBUTEROL SULFATE (2.5 MG/3ML) 0.083% IN NEBU
5.0000 mg | INHALATION_SOLUTION | Freq: Once | RESPIRATORY_TRACT | Status: DC
  Administered 2010-07-25: 5 mg via RESPIRATORY_TRACT

## 2010-07-25 MED ORDER — PREDNISONE 20 MG PO TABS
60.0000 mg | ORAL_TABLET | Freq: Once | ORAL | Status: AC
Start: 2010-07-25 — End: 2010-07-25
  Administered 2010-07-25: 60 mg via ORAL

## 2010-07-25 MED ORDER — IPRATROPIUM BROMIDE 0.02 % IN SOLN
0.5000 mg | Freq: Once | RESPIRATORY_TRACT | Status: AC
  Administered 2010-07-25: 0.5 mg via RESPIRATORY_TRACT

## 2010-07-25 MED ORDER — ALBUTEROL SULFATE (2.5 MG/3ML) 0.083% IN NEBU
5.0000 mg | INHALATION_SOLUTION | Freq: Once | RESPIRATORY_TRACT | Status: DC
  Filled 2010-07-25: qty 6

## 2010-07-25 NOTE — ED Notes (Signed)
Pt c/o sob and cough since July 3rd. Pt has audible wheezing. Pt has used nebulizer with no relief.

## 2010-07-25 NOTE — ED Provider Notes (Signed)
History     Chief Complaint  Patient presents with  . Shortness of Breath    pt c/o sob and cough x 3 days, today being the worst.   Patient is a 55 y.o. female presenting with shortness of breath. The history is provided by the patient.  Shortness of Breath  The current episode started 3 to 5 days ago. The onset was gradual. The problem occurs continuously. The problem has been unchanged. The symptoms are relieved by nothing. The symptoms are aggravated by activity. Associated symptoms include cough, shortness of breath and wheezing. Pertinent negatives include no chest pain, no chest pressure, no orthopnea, no fever, no rhinorrhea and no sore throat. The cough is productive. Nothing relieves the cough. Her past medical history is significant for past wheezing. Past medical history comments: COPD. There were no sick contacts.    Past Medical History  Diagnosis Date  . COPD (chronic obstructive pulmonary disease)     Past Surgical History  Procedure Date  . Abdominal hysterectomy   . Appendectomy     History reviewed. No pertinent family history.  History  Substance Use Topics  . Smoking status: Current Everyday Smoker  . Smokeless tobacco: Not on file  . Alcohol Use: No    OB History    Grav Para Term Preterm Abortions TAB SAB Ect Mult Living   2 2        2       Review of Systems  Constitutional: Negative for fever.  HENT: Negative for sore throat and rhinorrhea.   Respiratory: Positive for cough, shortness of breath and wheezing.   Cardiovascular: Negative for chest pain and orthopnea.  All other systems reviewed and are negative.    Physical Exam  BP 135/86  Pulse 92  Temp(Src) 97.7 F (36.5 C) (Oral)  Resp 24  Ht 5\' 2"  (1.575 m)  Wt 92 lb (41.731 kg)  BMI 16.83 kg/m2  SpO2 92%  Physical Exam  Nursing note and vitals reviewed. Constitutional: She is oriented to person, place, and time. She appears well-developed and well-nourished. No distress.  HENT:    Head: Normocephalic and atraumatic.  Eyes: EOM are normal.  Neck: Normal range of motion.  Cardiovascular: Normal rate, regular rhythm and normal heart sounds.   Pulmonary/Chest: Effort normal. She has wheezes. She has no rales. She exhibits no tenderness.  Abdominal: Soft. She exhibits no distension. There is no tenderness.  Musculoskeletal: Normal range of motion.  Neurological: She is alert and oriented to person, place, and time.  Skin: Skin is warm and dry.  Psychiatric: She has a normal mood and affect. Judgment normal.    ED Course  Procedures  MDM COPD exacerbation. Improved in ER with steroids and albuterol/atrovent. cxr clear. Home with steroids and scheduled inhaler therapy. Close pcp followup      Lyanne Co, MD 07/25/10 2223

## 2010-07-25 NOTE — ED Notes (Signed)
Pt states that she is feeling much better now. No wheezing noted. Pt up to bathroom. Room air Sa02- 92%.

## 2010-10-20 ENCOUNTER — Other Ambulatory Visit (HOSPITAL_COMMUNITY): Payer: Self-pay | Admitting: Internal Medicine

## 2010-10-20 DIAGNOSIS — Z09 Encounter for follow-up examination after completed treatment for conditions other than malignant neoplasm: Secondary | ICD-10-CM

## 2010-11-04 ENCOUNTER — Ambulatory Visit (HOSPITAL_COMMUNITY)
Admission: RE | Admit: 2010-11-04 | Discharge: 2010-11-04 | Disposition: A | Discharge status: Home / Self Care | Payer: PRIVATE HEALTH INSURANCE | Source: Ambulatory Visit | Attending: Internal Medicine | Admitting: Internal Medicine

## 2010-11-04 ENCOUNTER — Other Ambulatory Visit: Payer: Self-pay | Admitting: Internal Medicine

## 2010-11-04 DIAGNOSIS — Z09 Encounter for follow-up examination after completed treatment for conditions other than malignant neoplasm: Secondary | ICD-10-CM

## 2010-11-04 DIAGNOSIS — R921 Mammographic calcification found on diagnostic imaging of breast: Secondary | ICD-10-CM

## 2010-11-04 DIAGNOSIS — R928 Other abnormal and inconclusive findings on diagnostic imaging of breast: Secondary | ICD-10-CM | POA: Insufficient documentation

## 2010-11-11 ENCOUNTER — Ambulatory Visit
Admission: RE | Admit: 2010-11-11 | Discharge: 2010-11-11 | Disposition: A | Discharge status: Home / Self Care | Payer: Medicaid Other | Source: Ambulatory Visit | Attending: Internal Medicine | Admitting: Internal Medicine

## 2010-11-11 DIAGNOSIS — R921 Mammographic calcification found on diagnostic imaging of breast: Secondary | ICD-10-CM

## 2010-11-13 ENCOUNTER — Emergency Department (HOSPITAL_COMMUNITY)
Admission: EM | Admit: 2010-11-13 | Discharge: 2010-11-13 | Disposition: A | Discharge status: Home / Self Care | Payer: Medicaid Other | Attending: Emergency Medicine | Admitting: Emergency Medicine

## 2010-11-13 ENCOUNTER — Encounter (HOSPITAL_COMMUNITY): Payer: Self-pay | Admitting: *Deleted

## 2010-11-13 ENCOUNTER — Emergency Department (HOSPITAL_COMMUNITY): Payer: Medicaid Other

## 2010-11-13 DIAGNOSIS — J441 Chronic obstructive pulmonary disease with (acute) exacerbation: Secondary | ICD-10-CM

## 2010-11-13 DIAGNOSIS — F17200 Nicotine dependence, unspecified, uncomplicated: Secondary | ICD-10-CM | POA: Insufficient documentation

## 2010-11-13 MED ORDER — IPRATROPIUM BROMIDE 0.02 % IN SOLN
0.5000 mg | Freq: Once | RESPIRATORY_TRACT | Status: AC
  Administered 2010-11-13: 0.5 mg via RESPIRATORY_TRACT
  Filled 2010-11-13: qty 2.5

## 2010-11-13 MED ORDER — PREDNISONE 20 MG PO TABS: 20.0000 mg | ORAL_TABLET | Freq: Every day | ORAL | Status: AC

## 2010-11-13 MED ORDER — PREDNISONE 20 MG PO TABS
40.0000 mg | ORAL_TABLET | Freq: Once | ORAL | Status: AC
  Administered 2010-11-13: 40 mg via ORAL
  Filled 2010-11-13: qty 2

## 2010-11-13 MED ORDER — DOXYCYCLINE HYCLATE 100 MG PO CAPS: 100.0000 mg | ORAL_CAPSULE | Freq: Two times a day (BID) | ORAL | Status: AC

## 2010-11-13 MED ORDER — ALBUTEROL SULFATE (5 MG/ML) 0.5% IN NEBU
5.0000 mg | INHALATION_SOLUTION | Freq: Once | RESPIRATORY_TRACT | Status: AC
  Administered 2010-11-13: 5 mg via RESPIRATORY_TRACT
  Filled 2010-11-13: qty 1

## 2010-11-13 NOTE — ED Notes (Signed)
Cough, sob, cold sx.  Used nebulizer with albuterol at home without relief, wheeze

## 2010-11-13 NOTE — ED Provider Notes (Signed)
History     CSN: [id number] Arrival date & time: 11/13/2010  3:29 AM   First MD Initiated Contact with Patient 11/13/10 0340      Chief Complaint  Patient presents with  . Shortness of Breath    (Consider location/radiation/quality/duration/timing/severity/associated sxs/prior treatment) HPI Comments: Is a 55 year old female with a history of COPD who presents with gradual onset shortness of breath which is gradually getting worse, associated with coughing and a heaviness to her chest. Symptoms are moderate to severe at this time and did not improve significantly with albuterol treatment at home prior to arrival. She denies fevers, vomiting, swelling.  Patient is a 55 y.o. female presenting with shortness of breath. The history is provided by the patient and medical records.  Shortness of Breath  Associated symptoms include shortness of breath.    Past Medical History  Diagnosis Date  . COPD (chronic obstructive pulmonary disease)     Past Surgical History  Procedure Date  . Abdominal hysterectomy   . Appendectomy   . Cesarean section     Family History  Problem Relation Age of Onset  . Heart failure Father     History  Substance Use Topics  . Smoking status: Current Everyday Smoker -- 1.0 packs/day    Types: Cigarettes  . Smokeless tobacco: Not on file  . Alcohol Use: Yes    OB History    Grav Para Term Preterm Abortions TAB SAB Ect Mult Living   2 2        2       Review of Systems  Respiratory: Positive for shortness of breath.   All other systems reviewed and are negative.    Allergies  Avelox  Home Medications   Current Outpatient Rx  Name Route Sig Dispense Refill  . ALBUTEROL SULFATE 1.25 MG/3ML IN NEBU Nebulization Take 1 ampule by nebulization every 6 (six) hours as needed.      . ALBUTEROL SULFATE 2 MG PO TABS Oral Take 2 mg by mouth 3 (three) times daily.      Marland Kitchen ALPRAZOLAM 0.5 MG PO TABS Oral Take 0.5 mg by mouth at bedtime as needed.        Marland Kitchen DOXYCYCLINE HYCLATE 100 MG PO CAPS Oral Take 1 capsule (100 mg total) by mouth 2 (two) times daily. 20 capsule 0  . PREDNISONE 20 MG PO TABS Oral Take 1 tablet (20 mg total) by mouth daily. 10 tablet 0    BP 109/67  Pulse 81  Temp(Src) 98.6 F (37 C) (Oral)  Resp 20  Ht 5\' 2"  (1.575 m)  Wt 92 lb (41.731 kg)  BMI 16.83 kg/m2  SpO2 92%  Physical Exam  Nursing note and vitals reviewed. Constitutional: She appears well-developed and well-nourished.  HENT:  Head: Normocephalic and atraumatic.  Mouth/Throat: No oropharyngeal exudate.       Pharynx erythematous without exudate or asymmetry.  Eyes: Conjunctivae and EOM are normal. Pupils are equal, round, and reactive to light. Right eye exhibits no discharge. Left eye exhibits no discharge. No scleral icterus.  Neck: Normal range of motion. Neck supple. No JVD present. No thyromegaly present.  Cardiovascular: Normal rate, regular rhythm, normal heart sounds and intact distal pulses.  Exam reveals no gallop and no friction rub.   No murmur heard. Pulmonary/Chest: She has wheezes. She has no rales.       Mild increased work of breathing with mild tachypnea.  Abdominal: Soft. Bowel sounds are normal. She exhibits no distension and no mass.  There is no tenderness.  Musculoskeletal: Normal range of motion. She exhibits no edema and no tenderness.  Lymphadenopathy:    She has no cervical adenopathy.  Neurological: She is alert. Coordination normal.  Skin: Skin is warm and dry. No rash noted. No erythema.  Psychiatric: She has a normal mood and affect. Her behavior is normal.    ED Course  Procedures (including critical care time)    1. COPD exacerbation       MDM  Overall patient is speaking in full sentences, has hypoxia to 94% on room air but otherwise normal vital signs. She admits to increased cough and shortness of breath over the last couple of days. Will rule out pneumonia with x-ray, treat with bronchodilators and  prednisone and reevaluate.  Dg Chest 2 View  11/13/2010  *RADIOLOGY REPORT*  Clinical Data: Cough, congestion, wheezing and shortness of breath. History of smoking.  CHEST - 2 VIEW  Comparison: Chest radiograph performed 07/25/2010  Findings: The lungs are hyperexpanded, with flattening of the hemidiaphragms, compatible with COPD.  Mild chronic peribronchial thickening is noted.  There is no evidence of focal opacification, pleural effusion or pneumothorax.  The heart is normal in size; the mediastinal contour is within normal limits.  No acute osseous abnormalities are seen.  There is a healed fracture of the left tenth rib.  IMPRESSION: Findings of COPD; no acute cardiopulmonary process seen.  Original Report Authenticated By: Tonia Ghent, M.D.   She feels much improved after receiving albuterol treatment and prednisone. Reexamination shows minimal wheezing, no distress, no tachypnea. Will start patient on doxycycline as well as 5 days of prednisone and have followup with her family doctor. She is aware of the indications for return to the emergency department.  Vida Roller, MD 11/13/10 [phone]

## 2010-12-12 ENCOUNTER — Emergency Department (HOSPITAL_COMMUNITY)
Admission: EM | Admit: 2010-12-12 | Discharge: 2010-12-12 | Disposition: A | Discharge status: Home / Self Care | Payer: PRIVATE HEALTH INSURANCE | Attending: Emergency Medicine | Admitting: Emergency Medicine

## 2010-12-12 ENCOUNTER — Encounter (HOSPITAL_COMMUNITY): Payer: Self-pay | Admitting: *Deleted

## 2010-12-12 DIAGNOSIS — N390 Urinary tract infection, site not specified: Secondary | ICD-10-CM

## 2010-12-12 DIAGNOSIS — M5430 Sciatica, unspecified side: Secondary | ICD-10-CM

## 2010-12-12 DIAGNOSIS — R3911 Hesitancy of micturition: Secondary | ICD-10-CM | POA: Insufficient documentation

## 2010-12-12 DIAGNOSIS — J4489 Other specified chronic obstructive pulmonary disease: Secondary | ICD-10-CM | POA: Insufficient documentation

## 2010-12-12 DIAGNOSIS — J449 Chronic obstructive pulmonary disease, unspecified: Secondary | ICD-10-CM | POA: Insufficient documentation

## 2010-12-12 DIAGNOSIS — M545 Low back pain, unspecified: Secondary | ICD-10-CM | POA: Insufficient documentation

## 2010-12-12 LAB — URINE MICROSCOPIC-ADD ON

## 2010-12-12 LAB — URINALYSIS, ROUTINE W REFLEX MICROSCOPIC
Glucose, UA: NEGATIVE mg/dL
Protein, ur: NEGATIVE mg/dL

## 2010-12-12 MED ORDER — HYDROCODONE-ACETAMINOPHEN 5-325 MG PO TABS: 2.0000 | ORAL_TABLET | ORAL | Status: AC | PRN

## 2010-12-12 MED ORDER — SULFAMETHOXAZOLE-TRIMETHOPRIM 800-160 MG PO TABS: 1.0000 | ORAL_TABLET | Freq: Two times a day (BID) | ORAL | Status: AC

## 2010-12-12 NOTE — ED Notes (Signed)
Pt c/o back pain that radiates to her right flank. Pt states she is having difficulty urinating. Pt states she is also sore to both legs and feet.

## 2010-12-12 NOTE — ED Provider Notes (Signed)
History     CSN: [id number] Arrival date & time: 12/12/2010 10:17 AM   None     Chief Complaint  Patient presents with  . Back Pain    HPI Misty Richardson is a 55 y.o. female who presents to [facility] for right side back pain and difficulty urinating. Onset of symptoms last weekend with low back pain and then symptoms got worse with decreased urination and 3 days ago. Denies fever but has had chills. Also c/o leg pain coming from back pain on right side.   Past Medical History  Diagnosis Date  . COPD (chronic obstructive pulmonary disease)     Past Surgical History  Procedure Date  . Abdominal hysterectomy   . Appendectomy   . Cesarean section     Family History  Problem Relation Age of Onset  . Heart failure Father     History  Substance Use Topics  . Smoking status: Current Everyday Smoker -- 1.0 packs/day    Types: Cigarettes  . Smokeless tobacco: Not on file  . Alcohol Use: Yes    OB History    Grav Para Term Preterm Abortions TAB SAB Ect Mult Living   2 2        2       Review of Systems  Constitutional: Positive for chills. Negative for fever, diaphoresis and fatigue.  HENT: Negative for ear pain, congestion, sore throat, neck pain, neck stiffness, dental problem and sinus pressure.   Eyes: Negative for photophobia, pain and discharge.  Respiratory: Positive for wheezing.        COPD  Cardiovascular: Negative.   Gastrointestinal: Positive for abdominal pain. Negative for nausea, vomiting, diarrhea and constipation.  Genitourinary: Positive for difficulty urinating. Negative for dysuria, urgency, frequency, flank pain, vaginal bleeding, vaginal discharge and vaginal pain.  Musculoskeletal: Positive for back pain. Negative for myalgias and gait problem.  Skin: Negative for color change and rash.  Neurological: Negative for dizziness, speech difficulty, weakness, light-headedness, numbness and headaches.  Psychiatric/Behavioral: The patient is nervous/anxious.       Allergies  Avelox  Home Medications   Current Outpatient Rx  Name Route Sig Dispense Refill  . ALBUTEROL SULFATE 1.25 MG/3ML IN NEBU Nebulization Take 1 ampule by nebulization every 6 (six) hours as needed.      . ALBUTEROL SULFATE 2 MG PO TABS Oral Take 2 mg by mouth 3 (three) times daily.      Marland Kitchen ALPRAZOLAM 0.5 MG PO TABS Oral Take 0.5 mg by mouth at bedtime as needed.        BP 116/70  Pulse 75  Temp(Src) 98.2 F (36.8 C) (Oral)  Resp 18  Ht 5\' 2"  (1.575 m)  Wt 95 lb (43.092 kg)  BMI 17.38 kg/m2  SpO2 96%  Physical Exam  Nursing note and vitals reviewed. Constitutional: She is oriented to person, place, and time. She appears well-developed and well-nourished.  HENT:  Head: Normocephalic and atraumatic.  Eyes: Conjunctivae and EOM are normal. Pupils are equal, round, and reactive to light.  Neck: Normal range of motion. Neck supple.  Cardiovascular: Normal rate.   Pulmonary/Chest: Effort normal.  Abdominal: Soft. There is tenderness.       Minimal suprapubic tenderness and right lower abdomen with radiation to lower back.  Musculoskeletal:       Legs:      Tender over right sciatic nerve. Pain with straight leg raises on the right.  Neurological: She is alert and oriented to person, place, and time.  No cranial nerve deficit.  Skin: Skin is warm and dry.  Psychiatric: She has a normal mood and affect. Her behavior is normal. Judgment normal.   Results for orders placed during the hospital encounter of 12/12/10 (from the past 24 hour(s))  URINALYSIS, ROUTINE W REFLEX MICROSCOPIC     Status: Abnormal   Collection Time   12/12/10 10:32 AM      Component Value Range   Color, Urine YELLOW  YELLOW    Appearance CLEAR  CLEAR    Specific Gravity, Urine 1.020  1.005 - 1.030    pH 6.0  5.0 - 8.0    Glucose, UA NEGATIVE  NEGATIVE (mg/dL)   Hgb urine dipstick TRACE (*) NEGATIVE    Bilirubin Urine NEGATIVE  NEGATIVE    Ketones, ur NEGATIVE  NEGATIVE (mg/dL)   Protein, ur  NEGATIVE  NEGATIVE (mg/dL)   Urobilinogen, UA 0.2  0.0 - 1.0 (mg/dL)   Nitrite NEGATIVE  NEGATIVE    Leukocytes, UA SMALL (*) NEGATIVE   URINE MICROSCOPIC-ADD ON     Status: Normal   Collection Time   12/12/10 10:32 AM      Component Value Range   WBC, UA 0-2  <3 (WBC/hpf)   RBC / HPF 3-6  <3 (RBC/hpf)   Assessment: Sciatica   Early UTI  Plan:  Septra DS bid x 5 days   Pain management   Culture urine ED Course  Procedures            [facility] NP 12/12/10 1122

## 2010-12-12 NOTE — ED Provider Notes (Signed)
Medical screening examination/treatment/procedure(s) were performed by non-physician practitioner and as supervising physician I was immediately available for consultation/collaboration.   Maureen Duesing, MD 12/12/10 1529 

## 2010-12-19 ENCOUNTER — Emergency Department (HOSPITAL_COMMUNITY)
Admission: EM | Admit: 2010-12-19 | Discharge: 2010-12-19 | Disposition: A | Discharge status: Home / Self Care | Payer: PRIVATE HEALTH INSURANCE | Attending: Emergency Medicine | Admitting: Emergency Medicine

## 2010-12-19 ENCOUNTER — Encounter (HOSPITAL_COMMUNITY): Payer: Self-pay | Admitting: *Deleted

## 2010-12-19 ENCOUNTER — Emergency Department (HOSPITAL_COMMUNITY): Payer: PRIVATE HEALTH INSURANCE

## 2010-12-19 DIAGNOSIS — J441 Chronic obstructive pulmonary disease with (acute) exacerbation: Secondary | ICD-10-CM | POA: Insufficient documentation

## 2010-12-19 DIAGNOSIS — Z9079 Acquired absence of other genital organ(s): Secondary | ICD-10-CM | POA: Insufficient documentation

## 2010-12-19 DIAGNOSIS — F17200 Nicotine dependence, unspecified, uncomplicated: Secondary | ICD-10-CM | POA: Insufficient documentation

## 2010-12-19 DIAGNOSIS — Z9889 Other specified postprocedural states: Secondary | ICD-10-CM | POA: Insufficient documentation

## 2010-12-19 LAB — BASIC METABOLIC PANEL
BUN: 21 mg/dL (ref 6–23)
CO2: 27 mEq/L (ref 19–32)
Chloride: 99 mEq/L (ref 96–112)
Glucose, Bld: 94 mg/dL (ref 70–99)
Potassium: 3.4 mEq/L — ABNORMAL LOW (ref 3.5–5.1)

## 2010-12-19 LAB — DIFFERENTIAL
Lymphs Abs: 4.3 10*3/uL — ABNORMAL HIGH (ref 0.7–4.0)
Monocytes Relative: 7 % (ref 3–12)
Neutro Abs: 2.9 10*3/uL (ref 1.7–7.7)
Neutrophils Relative %: 36 % — ABNORMAL LOW (ref 43–77)

## 2010-12-19 LAB — CBC
Hemoglobin: 14.4 g/dL (ref 12.0–15.0)
MCH: 32.4 pg (ref 26.0–34.0)
RBC: 4.45 MIL/uL (ref 3.87–5.11)

## 2010-12-19 MED ORDER — DOXYCYCLINE HYCLATE 100 MG PO CAPS: 100.0000 mg | ORAL_CAPSULE | Freq: Two times a day (BID) | ORAL | Status: AC

## 2010-12-19 MED ORDER — ALBUTEROL SULFATE (5 MG/ML) 0.5% IN NEBU
5.0000 mg | INHALATION_SOLUTION | RESPIRATORY_TRACT | Status: AC
  Administered 2010-12-19: 5 mg via RESPIRATORY_TRACT
  Filled 2010-12-19: qty 1

## 2010-12-19 MED ORDER — ALBUTEROL SULFATE HFA 108 (90 BASE) MCG/ACT IN AERS: 2.0000 | INHALATION_SPRAY | RESPIRATORY_TRACT | Status: DC | PRN

## 2010-12-19 MED ORDER — SODIUM CHLORIDE 0.9 % IV SOLN
Freq: Once | INTRAVENOUS | Status: AC
  Administered 2010-12-19: 06:00:00 via INTRAVENOUS

## 2010-12-19 MED ORDER — PREDNISONE 20 MG PO TABS: 20.0000 mg | ORAL_TABLET | Freq: Every day | ORAL | Status: AC

## 2010-12-19 NOTE — ED Notes (Signed)
Dyspneic,  HHn by EMS on arrival,  Solu medrol 125 IV

## 2010-12-19 NOTE — ED Notes (Signed)
Drinking coke, says she feels better after HHN  By EMS

## 2010-12-19 NOTE — ED Provider Notes (Signed)
History     CSN: [id number] Arrival date & time: 12/19/2010  5:51 AM   First MD Initiated Contact with Patient 12/19/10 0557      Chief Complaint  Patient presents with  . Shortness of Breath    (Consider location/radiation/quality/duration/timing/severity/associated sxs/prior treatment) HPI Comments: History of significant COPD, frequent emergency visits for same. Found by ambulance to be wheezing, Solu-Medrol and albuterol nebulizer given with some improvement.  Those were gradual onset yesterday, constant, gradually getting worse, not associated with fever chills nausea or vomiting. She does have an intermittent cough which is nonproductive and has not gotten worse.  Patient is a 55 y.o. female presenting with shortness of breath. The history is provided by the patient and medical records.  Shortness of Breath  The current episode started yesterday. The onset was gradual. The problem occurs continuously. The problem has been gradually worsening. The problem is severe. Relieved by: Albuterol inhaler at home, also took one tablet of prednisone no improvement was minimal. The symptoms are aggravated by nothing. Associated symptoms include shortness of breath. She has had intermittent steroid use. Her past medical history is significant for past wheezing ( COPD).    Past Medical History  Diagnosis Date  . COPD (chronic obstructive pulmonary disease)     Past Surgical History  Procedure Date  . Abdominal hysterectomy   . Appendectomy   . Cesarean section     Family History  Problem Relation Age of Onset  . Heart failure Father     History  Substance Use Topics  . Smoking status: Current Everyday Smoker -- 1.0 packs/day    Types: Cigarettes  . Smokeless tobacco: Not on file  . Alcohol Use: Yes    OB History    Grav Para Term Preterm Abortions TAB SAB Ect Mult Living   2 2        2       Review of Systems  Respiratory: Positive for shortness of breath.   All other  systems reviewed and are negative.    Allergies  Avelox  Home Medications   Current Outpatient Rx  Name Route Sig Dispense Refill  . ACETAMINOPHEN ER 650 MG PO TBCR Oral Take 650 mg by mouth every 8 (eight) hours as needed. For pain     . ALBUTEROL SULFATE 1.25 MG/3ML IN NEBU Nebulization Take 1 ampule by nebulization every 6 (six) hours as needed. For shortness of breath    . ALBUTEROL SULFATE HFA 108 (90 BASE) MCG/ACT IN AERS Inhalation Inhale 2 puffs into the lungs every 6 (six) hours as needed. For shortness of breath      . ALPRAZOLAM 0.5 MG PO TABS Oral Take 0.5 mg by mouth at bedtime as needed. For anxiety    . HYDROCODONE-ACETAMINOPHEN 5-325 MG PO TABS Oral Take 2 tablets by mouth every 4 (four) hours as needed for pain. 16 tablet 0  . ALBUTEROL SULFATE HFA 108 (90 BASE) MCG/ACT IN AERS Inhalation Inhale 2 puffs into the lungs every 4 (four) hours as needed for wheezing or shortness of breath. 1 Inhaler 3  . DOXYCYCLINE HYCLATE 100 MG PO CAPS Oral Take 1 capsule (100 mg total) by mouth 2 (two) times daily. 20 capsule 0  . PREDNISONE 20 MG PO TABS Oral Take 1 tablet (20 mg total) by mouth daily. 10 tablet 0  . SULFAMETHOXAZOLE-TRIMETHOPRIM 800-160 MG PO TABS Oral Take 1 tablet by mouth every 12 (twelve) hours. 10 tablet 0    BP 123/84  Pulse 105  Temp(Src) 98 F (36.7 C) (Oral)  Ht 5\' 2"  (1.575 m)  Wt 95 lb (43.092 kg)  BMI 17.38 kg/m2  SpO2 93%  Physical Exam  Nursing note and vitals reviewed. Constitutional: She appears well-developed and well-nourished. No distress.  HENT:  Head: Normocephalic and atraumatic.  Mouth/Throat: Oropharynx is clear and moist. No oropharyngeal exudate.  Eyes: Conjunctivae and EOM are normal. Pupils are equal, round, and reactive to light. Right eye exhibits no discharge. Left eye exhibits no discharge. No scleral icterus.  Neck: Normal range of motion. Neck supple. No JVD present. No thyromegaly present.  Cardiovascular: Normal rate,  regular rhythm, normal heart sounds and intact distal pulses.  Exam reveals no gallop and no friction rub.   No murmur heard. Pulmonary/Chest: Effort normal. No respiratory distress. She has wheezes ( Bilateral expiratory wheezing, speaks in full sentences, minimal tachypnea). She has no rales.  Abdominal: Soft. Bowel sounds are normal. She exhibits no distension and no mass. There is no tenderness.  Musculoskeletal: Normal range of motion. She exhibits no edema and no tenderness.  Lymphadenopathy:    She has no cervical adenopathy.  Neurological: She is alert. Coordination normal.  Skin: Skin is warm and dry. No rash noted. No erythema.  Psychiatric: She has a normal mood and affect. Her behavior is normal.    ED Course  Procedures (including critical care time)  Labs Reviewed  DIFFERENTIAL - Abnormal; Notable for the following:    Neutrophils Relative 36 (*)    Lymphocytes Relative 52 (*)    Lymphs Abs 4.3 (*)    All other components within normal limits  CBC  BASIC METABOLIC PANEL   No results found.   1. COPD exacerbation       MDM  No tachycardia or fever. Has exam that is consistent with mild to moderate COPD exacerbation, improved significantly after albuterol subjectively per patient. Solu-Medrol given in route, patient declines chest x-ray but agrees to albuterol treatment here, home with refill of MDI. Wants to see her family doctor Dr. Sherwood Gambler this morning          Vida Roller, MD 12/19/10 [phone]

## 2010-12-19 NOTE — ED Notes (Signed)
Says she feels better 

## 2010-12-19 NOTE — ED Notes (Signed)
Breathing tx started

## 2011-01-16 ENCOUNTER — Encounter (HOSPITAL_COMMUNITY): Payer: Self-pay | Admitting: Emergency Medicine

## 2011-01-16 ENCOUNTER — Emergency Department (HOSPITAL_COMMUNITY)
Admission: EM | Admit: 2011-01-16 | Discharge: 2011-01-16 | Disposition: A | Discharge status: Home / Self Care | Payer: PRIVATE HEALTH INSURANCE | Attending: Emergency Medicine | Admitting: Emergency Medicine

## 2011-01-16 DIAGNOSIS — Z79899 Other long term (current) drug therapy: Secondary | ICD-10-CM | POA: Insufficient documentation

## 2011-01-16 DIAGNOSIS — R0602 Shortness of breath: Secondary | ICD-10-CM | POA: Insufficient documentation

## 2011-01-16 DIAGNOSIS — F17200 Nicotine dependence, unspecified, uncomplicated: Secondary | ICD-10-CM | POA: Insufficient documentation

## 2011-01-16 DIAGNOSIS — J441 Chronic obstructive pulmonary disease with (acute) exacerbation: Secondary | ICD-10-CM | POA: Insufficient documentation

## 2011-01-16 MED ORDER — ALBUTEROL (5 MG/ML) CONTINUOUS INHALATION SOLN
10.0000 mg | INHALATION_SOLUTION | RESPIRATORY_TRACT | Status: AC
  Administered 2011-01-16: 10 mg via RESPIRATORY_TRACT
  Filled 2011-01-16: qty 20

## 2011-01-16 MED ORDER — IPRATROPIUM BROMIDE 0.02 % IN SOLN
RESPIRATORY_TRACT | Status: AC
  Administered 2011-01-16: 0.5 mg
  Filled 2011-01-16: qty 2.5

## 2011-01-16 MED ORDER — PREDNISONE 20 MG PO TABS: 20.0000 mg | ORAL_TABLET | Freq: Every day | ORAL | Status: AC

## 2011-01-16 MED ORDER — DOXYCYCLINE HYCLATE 100 MG PO CAPS: 100.0000 mg | ORAL_CAPSULE | Freq: Two times a day (BID) | ORAL | Status: AC

## 2011-01-16 MED ORDER — DOXYCYCLINE HYCLATE 100 MG PO TABS
100.0000 mg | ORAL_TABLET | Freq: Once | ORAL | Status: AC
  Administered 2011-01-16: 100 mg via ORAL
  Filled 2011-01-16: qty 1

## 2011-01-16 MED ORDER — IPRATROPIUM BROMIDE 0.02 % IN SOLN: 0.5000 mg | RESPIRATORY_TRACT | Status: DC

## 2011-01-16 MED ORDER — METHYLPREDNISOLONE SODIUM SUCC 125 MG IJ SOLR
125.0000 mg | Freq: Once | INTRAMUSCULAR | Status: AC
  Administered 2011-01-16: 125 mg via INTRAVENOUS
  Filled 2011-01-16: qty 2

## 2011-01-16 MED ORDER — ALBUTEROL SULFATE (5 MG/ML) 0.5% IN NEBU
INHALATION_SOLUTION | RESPIRATORY_TRACT | Status: AC
  Administered 2011-01-16: 5 mg
  Filled 2011-01-16: qty 1

## 2011-01-16 NOTE — ED Notes (Signed)
Patient complaining of shortness of breath since Thursday, became worse tonight.

## 2011-01-16 NOTE — ED Provider Notes (Signed)
History     CSN: [id number]  Arrival date & time 01/16/11  0106   First MD Initiated Contact with Patient 01/16/11 0118      Chief Complaint  Patient presents with  . Shortness of Breath    (Consider location/radiation/quality/duration/timing/severity/associated sxs/prior treatment) HPI Comments: Patient is a 55 year old female with a history of COPD, who presents with a complaint of shortness of breath. She states that her shortness of breath started yesterday, has gradually gotten worse, is persistent, worse with ambulation and associated with a mild intermittent cough. She denies fevers, swelling of her legs, chest pain, back pain, weakness, numbness, headache, sore throat, congestion. She has used her albuterol nebulizer, tablet and MDI inhaler with minimal improvement.  She was treated recently with steroids one month ago and improved significantly.  Patient is a 55 y.o. female presenting with shortness of breath. The history is provided by the patient and medical records.  Shortness of Breath  Associated symptoms include shortness of breath.    Past Medical History  Diagnosis Date  . COPD (chronic obstructive pulmonary disease)     Past Surgical History  Procedure Date  . Abdominal hysterectomy   . Appendectomy   . Cesarean section     Family History  Problem Relation Age of Onset  . Heart failure Father     History  Substance Use Topics  . Smoking status: Current Everyday Smoker -- 1.0 packs/day    Types: Cigarettes  . Smokeless tobacco: Not on file  . Alcohol Use: Yes    OB History    Grav Para Term Preterm Abortions TAB SAB Ect Mult Living   2 2        2       Review of Systems  Respiratory: Positive for shortness of breath.   All other systems reviewed and are negative.    Allergies  Avelox  Home Medications   Current Outpatient Rx  Name Route Sig Dispense Refill  . ACETAMINOPHEN ER 650 MG PO TBCR Oral Take 650 mg by mouth every 8 (eight)  hours as needed. For pain     . ALBUTEROL SULFATE 1.25 MG/3ML IN NEBU Nebulization Take 1 ampule by nebulization every 6 (six) hours as needed. For shortness of breath    . ALBUTEROL SULFATE HFA 108 (90 BASE) MCG/ACT IN AERS Inhalation Inhale 2 puffs into the lungs every 6 (six) hours as needed. For shortness of breath      . ALBUTEROL SULFATE HFA 108 (90 BASE) MCG/ACT IN AERS Inhalation Inhale 2 puffs into the lungs every 4 (four) hours as needed for wheezing or shortness of breath. 1 Inhaler 3  . ALPRAZOLAM 0.5 MG PO TABS Oral Take 0.5 mg by mouth at bedtime as needed. For anxiety    . DOXYCYCLINE HYCLATE 100 MG PO CAPS Oral Take 1 capsule (100 mg total) by mouth 2 (two) times daily. 20 capsule 0  . PREDNISONE 20 MG PO TABS Oral Take 1 tablet (20 mg total) by mouth daily. 10 tablet 0    BP 124/84  Pulse 100  Temp(Src) 98.1 F (36.7 C) (Oral)  Resp 24  Ht 5\' 2"  (1.575 m)  Wt 95 lb (43.092 kg)  BMI 17.38 kg/m2  SpO2 93%  Physical Exam  Nursing note and vitals reviewed. Constitutional: She appears well-developed and well-nourished.  HENT:  Head: Normocephalic and atraumatic.  Mouth/Throat: Oropharynx is clear and moist. No oropharyngeal exudate.  Eyes: Conjunctivae and EOM are normal. Pupils are equal, round, and reactive  to light. Right eye exhibits no discharge. Left eye exhibits no discharge. No scleral icterus.  Neck: Normal range of motion. Neck supple. No JVD present. No thyromegaly present.  Cardiovascular: Normal rate, regular rhythm, normal heart sounds and intact distal pulses.  Exam reveals no gallop and no friction rub.   No murmur heard. Pulmonary/Chest: Effort normal and breath sounds normal. No respiratory distress. She has no wheezes. She has no rales.       The patient is speaking in complete sentences, has mild tachypnea at 24 breaths per minute, diffuse mild expiratory wheezing, no rales, no accessory muscle use.  Abdominal: Soft. Bowel sounds are normal. She  exhibits no distension and no mass. There is no tenderness.  Musculoskeletal: Normal range of motion. She exhibits no edema and no tenderness.  Lymphadenopathy:    She has no cervical adenopathy.  Neurological: She is alert. Coordination normal.  Skin: Skin is warm and dry. No rash noted. No erythema.  Psychiatric: She has a normal mood and affect. Her behavior is normal.    ED Course  Procedures (including critical care time)  Labs Reviewed - No data to display No results found.   1. COPD exacerbation       MDM  Patient appears to be having COPD exacerbation, oxygen saturation of 96% on my exam on room air. She is in need of bronchodilator therapy, continuous nebulizer treatment initiated at 10 mg over one hour. Atrovent inhaled, Solu-Medrol intravenous. Currently she is not febrile or tachycardic.   4:30 AM, patient reevaluated and found to have pulse of 95, respirations of 18, oxygen saturation of 95% on room air, lung exam repeat no wheezing, no distress, speaks in full sentences. When patient ambulates she does have a temporary drop in oxygen saturation to 90%. I have encouraged her to followup with her primary care physician for further evaluation of need for home oxygen. I have offered her admission to the hospital but she has declined stating that she would rather go home and followup as an outpatient. She has made significant improvement in her clinical condition prior to discharge.  Patient states she has ample medication at home to continue albuterol and bronchodilator therapy  Medications given prior to discharge  #1 Solu-Medrol #2 albuterol continuous nebulizer #3 doxycycline  Prescriptions  #1 prednisone #2 doxycycline     Vida Roller, MD 01/16/11 [phone]

## 2011-01-22 ENCOUNTER — Encounter (HOSPITAL_COMMUNITY): Payer: No Typology Code available for payment source | Attending: Oncology

## 2011-01-22 DIAGNOSIS — D472 Monoclonal gammopathy: Secondary | ICD-10-CM

## 2011-01-22 DIAGNOSIS — E876 Hypokalemia: Secondary | ICD-10-CM | POA: Insufficient documentation

## 2011-01-22 LAB — COMPREHENSIVE METABOLIC PANEL
AST: 13 U/L (ref 0–37)
Albumin: 3.7 g/dL (ref 3.5–5.2)
Calcium: 9.6 mg/dL (ref 8.4–10.5)
Chloride: 104 mEq/L (ref 96–112)
Creatinine, Ser: 0.68 mg/dL (ref 0.50–1.10)
Total Bilirubin: 0.4 mg/dL (ref 0.3–1.2)

## 2011-01-22 LAB — DIFFERENTIAL
Lymphocytes Relative: 50 % — ABNORMAL HIGH (ref 12–46)
Monocytes Absolute: 0.6 10*3/uL (ref 0.1–1.0)
Monocytes Relative: 6 % (ref 3–12)
Neutro Abs: 4 10*3/uL (ref 1.7–7.7)

## 2011-01-22 LAB — CBC
MCV: 94.5 fL (ref 78.0–100.0)
Platelets: 375 10*3/uL (ref 150–400)
RDW: 12.9 % (ref 11.5–15.5)
WBC: 9.8 10*3/uL (ref 4.0–10.5)

## 2011-01-22 NOTE — Progress Notes (Signed)
Labs drawn today for cbc/diff, cmp, MM panel, [facility] and B2Mic

## 2011-01-25 LAB — KAPPA/LAMBDA LIGHT CHAINS: Kappa, lambda light chain ratio: 11.89 — ABNORMAL HIGH (ref 0.26–1.65)

## 2011-01-26 ENCOUNTER — Other Ambulatory Visit (HOSPITAL_COMMUNITY): Payer: Self-pay | Admitting: *Deleted

## 2011-01-26 DIAGNOSIS — E876 Hypokalemia: Secondary | ICD-10-CM

## 2011-01-26 LAB — MULTIPLE MYELOMA PANEL, SERUM
Alpha-1-Globulin: 4.7 % (ref 2.9–4.9)
Alpha-2-Globulin: 9.5 % (ref 7.1–11.8)
IgA: 42 mg/dL — ABNORMAL LOW (ref 69–380)
IgG (Immunoglobin G), Serum: 1710 mg/dL — ABNORMAL HIGH (ref 690–1700)
Total Protein: 7.3 g/dL (ref 6.0–8.3)

## 2011-02-10 ENCOUNTER — Telehealth (HOSPITAL_COMMUNITY): Payer: Self-pay | Admitting: *Deleted

## 2011-02-10 ENCOUNTER — Encounter (HOSPITAL_BASED_OUTPATIENT_CLINIC_OR_DEPARTMENT_OTHER): Payer: No Typology Code available for payment source

## 2011-02-10 DIAGNOSIS — E876 Hypokalemia: Secondary | ICD-10-CM

## 2011-02-10 LAB — POTASSIUM: Potassium: 3.6 mEq/L (ref 3.5–5.1)

## 2011-02-10 NOTE — Telephone Encounter (Signed)
Pt notified to continue same dose of potassium and that level is normal now.

## 2011-02-10 NOTE — Progress Notes (Signed)
Labs drawn today for k+ level

## 2011-02-17 ENCOUNTER — Encounter: Payer: Self-pay | Admitting: Oncology

## 2011-06-15 ENCOUNTER — Telehealth (HOSPITAL_COMMUNITY): Payer: Self-pay | Admitting: Oncology

## 2011-07-12 ENCOUNTER — Other Ambulatory Visit (HOSPITAL_COMMUNITY): Payer: Self-pay | Admitting: Internal Medicine

## 2011-07-12 DIAGNOSIS — R222 Localized swelling, mass and lump, trunk: Secondary | ICD-10-CM

## 2011-07-14 ENCOUNTER — Ambulatory Visit (HOSPITAL_COMMUNITY): Payer: PRIVATE HEALTH INSURANCE

## 2011-07-14 ENCOUNTER — Other Ambulatory Visit (HOSPITAL_COMMUNITY): Payer: No Typology Code available for payment source

## 2011-07-16 ENCOUNTER — Ambulatory Visit (HOSPITAL_COMMUNITY)
Admission: RE | Admit: 2011-07-16 | Discharge: 2011-07-16 | Disposition: A | Discharge status: Home / Self Care | Payer: No Typology Code available for payment source | Source: Ambulatory Visit | Attending: Internal Medicine | Admitting: Internal Medicine

## 2011-07-16 DIAGNOSIS — R911 Solitary pulmonary nodule: Secondary | ICD-10-CM | POA: Insufficient documentation

## 2011-07-16 DIAGNOSIS — J4489 Other specified chronic obstructive pulmonary disease: Secondary | ICD-10-CM | POA: Insufficient documentation

## 2011-07-16 DIAGNOSIS — R222 Localized swelling, mass and lump, trunk: Secondary | ICD-10-CM

## 2011-07-16 DIAGNOSIS — J449 Chronic obstructive pulmonary disease, unspecified: Secondary | ICD-10-CM | POA: Insufficient documentation

## 2011-07-20 ENCOUNTER — Encounter (HOSPITAL_COMMUNITY): Payer: No Typology Code available for payment source | Attending: Oncology

## 2011-07-20 DIAGNOSIS — D472 Monoclonal gammopathy: Secondary | ICD-10-CM | POA: Insufficient documentation

## 2011-07-20 DIAGNOSIS — J449 Chronic obstructive pulmonary disease, unspecified: Secondary | ICD-10-CM | POA: Insufficient documentation

## 2011-07-20 DIAGNOSIS — F17200 Nicotine dependence, unspecified, uncomplicated: Secondary | ICD-10-CM | POA: Insufficient documentation

## 2011-07-20 DIAGNOSIS — J4489 Other specified chronic obstructive pulmonary disease: Secondary | ICD-10-CM | POA: Insufficient documentation

## 2011-07-20 LAB — COMPREHENSIVE METABOLIC PANEL
AST: 15 U/L (ref 0–37)
Albumin: 4 g/dL (ref 3.5–5.2)
GFR calc Af Amer: >90 mL/min (ref >90)
GFR calc non Af Amer: >90 mL/min (ref >90)
Potassium: 4.3 mEq/L (ref 3.5–5.1)
Sodium: 133 mEq/L — ABNORMAL LOW (ref 135–145)
Total Bilirubin: 0.4 mg/dL (ref 0.3–1.2)
Total Protein: 8.2 g/dL (ref 6.0–8.3)

## 2011-07-20 LAB — DIFFERENTIAL
Basophils Relative: 1 % (ref 0–1)
Eosinophils Absolute: 0.1 10*3/uL (ref 0.0–0.7)
Neutrophils Relative %: 78 % — ABNORMAL HIGH (ref 43–77)

## 2011-07-20 LAB — CBC
MCH: 31 pg (ref 26.0–34.0)
MCHC: 34 g/dL (ref 30.0–36.0)
Platelets: 299 10*3/uL (ref 150–400)
RBC: 4.62 MIL/uL (ref 3.87–5.11)

## 2011-07-20 NOTE — Progress Notes (Signed)
Misty Richardson's reason for visit today are for labs as scheduled per MD orders.  Venipuncture performed with a 23 gauge butterfly needle to R Antecubital.  Misty Richardson tolerated venipuncture well and without incident; questions were answered and patient was discharged.

## 2011-07-21 LAB — BETA 2 MICROGLOBULIN, SERUM: Beta-2 Microglobulin: 1.81 mg/L — ABNORMAL HIGH (ref 1.01–1.73)

## 2011-07-21 LAB — KAPPA/LAMBDA LIGHT CHAINS: Lambda free light chains: 1.14 mg/dL (ref 0.57–2.63)

## 2011-07-23 LAB — MULTIPLE MYELOMA PANEL, SERUM
Albumin ELP: 57.7 % (ref 55.8–66.1)
Beta Globulin: 4.6 % — ABNORMAL LOW (ref 4.7–7.2)
IgA: 48 mg/dL — ABNORMAL LOW (ref 69–380)
IgM, Serum: 40 mg/dL — ABNORMAL LOW (ref 52–322)
M-Spike, %: 1.45 g/dL
Total Protein: 7.9 g/dL (ref 6.0–8.3)

## 2011-07-25 ENCOUNTER — Encounter (HOSPITAL_COMMUNITY): Payer: Self-pay

## 2011-07-25 ENCOUNTER — Emergency Department (HOSPITAL_COMMUNITY)
Admission: EM | Admit: 2011-07-25 | Discharge: 2011-07-25 | Disposition: A | Discharge status: Home / Self Care | Payer: No Typology Code available for payment source | Attending: Emergency Medicine | Admitting: Emergency Medicine

## 2011-07-25 ENCOUNTER — Emergency Department (HOSPITAL_COMMUNITY): Payer: No Typology Code available for payment source

## 2011-07-25 DIAGNOSIS — Z90710 Acquired absence of both cervix and uterus: Secondary | ICD-10-CM | POA: Insufficient documentation

## 2011-07-25 DIAGNOSIS — R109 Unspecified abdominal pain: Secondary | ICD-10-CM

## 2011-07-25 DIAGNOSIS — R1011 Right upper quadrant pain: Secondary | ICD-10-CM

## 2011-07-25 LAB — COMPREHENSIVE METABOLIC PANEL
ALT: 9 U/L (ref 0–35)
Alkaline Phosphatase: 78 U/L (ref 39–117)
BUN: 9 mg/dL (ref 6–23)
CO2: 28 mEq/L (ref 19–32)
GFR calc Af Amer: >90 mL/min (ref >90)
GFR calc non Af Amer: >90 mL/min (ref >90)
Glucose, Bld: 82 mg/dL (ref 70–99)
Potassium: 3.6 mEq/L (ref 3.5–5.1)
Sodium: 136 mEq/L (ref 135–145)
Total Bilirubin: 0.4 mg/dL (ref 0.3–1.2)

## 2011-07-25 LAB — URINALYSIS, ROUTINE W REFLEX MICROSCOPIC
Bilirubin Urine: NEGATIVE
Hgb urine dipstick: NEGATIVE
Nitrite: NEGATIVE
Specific Gravity, Urine: 1.01 (ref 1.005–1.030)
Urobilinogen, UA: 0.2 mg/dL (ref 0.0–1.0)
pH: 6 (ref 5.0–8.0)

## 2011-07-25 LAB — CBC WITH DIFFERENTIAL/PLATELET
Hemoglobin: 13 g/dL (ref 12.0–15.0)
Lymphocytes Relative: 39 % (ref 12–46)
Lymphs Abs: 3.1 10*3/uL (ref 0.7–4.0)
MCV: 91.6 fL (ref 78.0–100.0)
Monocytes Relative: 6 % (ref 3–12)
Neutrophils Relative %: 41 % — ABNORMAL LOW (ref 43–77)
Platelets: 273 10*3/uL (ref 150–400)
RBC: 4.19 MIL/uL (ref 3.87–5.11)
WBC: 7.8 10*3/uL (ref 4.0–10.5)

## 2011-07-25 MED ORDER — SODIUM CHLORIDE 0.9 % IV BOLUS (SEPSIS)
1000.0000 mL | Freq: Once | INTRAVENOUS | Status: AC
  Administered 2011-07-25: 1000 mL via INTRAVENOUS

## 2011-07-25 MED ORDER — HYDROMORPHONE HCL PF 1 MG/ML IJ SOLN
1.0000 mg | Freq: Once | INTRAMUSCULAR | Status: AC
  Administered 2011-07-25: 1 mg via INTRAVENOUS
  Filled 2011-07-25: qty 1

## 2011-07-25 MED ORDER — HYDROCODONE-ACETAMINOPHEN 5-325 MG PO TABS: 2.0000 | ORAL_TABLET | ORAL | Status: DC | PRN

## 2011-07-25 MED ORDER — IOHEXOL 300 MG/ML  SOLN
40.0000 mL | Freq: Once | INTRAMUSCULAR | Status: AC | PRN
  Administered 2011-07-25: 40 mL via ORAL

## 2011-07-25 MED ORDER — ONDANSETRON HCL 4 MG PO TABS: 4.0000 mg | ORAL_TABLET | Freq: Four times a day (QID) | ORAL | Status: AC

## 2011-07-25 MED ORDER — IOHEXOL 300 MG/ML  SOLN
100.0000 mL | Freq: Once | INTRAMUSCULAR | Status: AC | PRN
  Administered 2011-07-25: 100 mL via INTRAVENOUS

## 2011-07-25 MED ORDER — ONDANSETRON HCL 4 MG/2ML IJ SOLN
4.0000 mg | Freq: Once | INTRAMUSCULAR | Status: AC
  Administered 2011-07-25: 4 mg via INTRAVENOUS
  Filled 2011-07-25: qty 2

## 2011-07-25 NOTE — ED Provider Notes (Signed)
History    This chart was scribed for Glynn Octave, MD, MD by Smitty Pluck. The patient was seen in room APA06 and the patient's care was started at 2:23PM.   CSN: [id number]  Arrival date & time 07/25/11  1309   First MD Initiated Contact with Patient 07/25/11 1414      Chief Complaint  Patient presents with  . Flank Pain    (Consider location/radiation/quality/duration/timing/severity/associated sxs/prior treatment) The history is provided by the patient.   Misty Richardson is a 56 y.o. female who presents to the Emergency Department complaining of constant moderate RUQ pain onset 1 day ago. Pt reports that movement aggravates the pain. Denies pain like this in past, dysuria, fever n/v/d. Reports having COPD. Pt has had appendectomy. She has had partial hysterectomy and still has her ovaries. Denies vaginal bleeding and discharge. Denies radiation.   Past Medical History  Diagnosis Date  . COPD (chronic obstructive pulmonary disease)     Past Surgical History  Procedure Date  . Abdominal hysterectomy   . Appendectomy   . Cesarean section     Family History  Problem Relation Age of Onset  . Heart failure Father     History  Substance Use Topics  . Smoking status: Current Everyday Smoker -- 1.0 packs/day    Types: Cigarettes  . Smokeless tobacco: Not on file  . Alcohol Use: Yes    OB History    Grav Para Term Preterm Abortions TAB SAB Ect Mult Living   2 2        2       Review of Systems  All other systems reviewed and are negative.   10 Systems reviewed and all are negative for acute change except as noted in the HPI.   Allergies  Avelox  Home Medications   Current Outpatient Rx  Name Route Sig Dispense Refill  . ALBUTEROL SULFATE 1.25 MG/3ML IN NEBU Nebulization Take 1 ampule by nebulization every 6 (six) hours as needed. For shortness of breath    . ALPRAZOLAM 0.5 MG PO TABS Oral Take 0.5 mg by mouth at bedtime as needed. For anxiety    .  VITAMIN D 1000 UNITS PO TABS Oral Take 1,000 Units by mouth daily.    . IBUPROFEN 200 MG PO TABS Oral Take 400 mg by mouth every 6 (six) hours as needed. For pain    . ACETAMINOPHEN ER 650 MG PO TBCR Oral Take 650 mg by mouth every 8 (eight) hours as needed. For pain       BP 125/71  Pulse 64  Temp 98.6 F (37 C) (Oral)  Resp 20  Ht 5\' 2"  (1.575 m)  Wt 98 lb (44.453 kg)  BMI 17.92 kg/m2  SpO2 93%  Physical Exam  Nursing note and vitals reviewed. Constitutional: She is oriented to person, place, and time. She appears well-developed and well-nourished. No distress.  HENT:  Head: Normocephalic and atraumatic.  Eyes: Conjunctivae are normal. Pupils are equal, round, and reactive to light.  Neck: Normal range of motion. Neck supple.  Cardiovascular: Normal rate, regular rhythm and normal heart sounds.   Pulmonary/Chest: Effort normal and breath sounds normal. No respiratory distress.  Abdominal: Bowel sounds are normal. She exhibits no distension. There is tenderness (RUQ).  Musculoskeletal:       Paraspinal tenderness   Neurological: She is alert and oriented to person, place, and time.  Skin: Skin is warm and dry.  Psychiatric: She has a normal mood and affect.  Her behavior is normal.    ED Course  Procedures (including critical care time) DIAGNOSTIC STUDIES: Oxygen Saturation is 99% on room air, normal by my interpretation.    COORDINATION OF CARE: 2:36PM EDP discusses pt ED treatment with pt.  2:36PM EDP ordered medication: 0.9% NaCl bolus, zofran 4 mg, dilaudid 1 mg   Labs Reviewed  CBC WITH DIFFERENTIAL - Abnormal; Notable for the following:    Neutrophils Relative 41 (*)     Eosinophils Relative 13 (*)     Eosinophils Absolute 1.0 (*)     All other components within normal limits  URINALYSIS, ROUTINE W REFLEX MICROSCOPIC  COMPREHENSIVE METABOLIC PANEL  LIPASE, BLOOD   Ct Abdomen Pelvis W Contrast  07/25/2011  *RADIOLOGY REPORT*  Clinical Data: Right abdominal  pain.  Previous hysterectomy.  CT ABDOMEN AND PELVIS WITH CONTRAST  Technique:  Multidetector CT imaging of the abdomen and pelvis was performed following the standard protocol during bolus administration of intravenous contrast.  Contrast: OMNIPAQUE IOHEXOL 300 MG/ML  SOLN  Comparison: Pelvic ultrasound dated 02/09/2006.  Findings: Mild diffuse low density of the liver relative to the spleen.  Normal appearing spleen, pancreas, gallbladder, adrenal glands, kidneys and urinary bladder.  Surgically absent uterus.  No adnexal masses or enlarged lymph nodes.  Small amount of free peritoneal fluid in the inferior pelvis. Surgical clips in the right pelvis.  On the initial images, there is an elongated, smoothly marginated area of relative low density in the proximal jejunum.  On the delayed images, this is in a different position and more rounded in configuration, suggesting ingested material/medication.  Breathing motion blurring at the lung bases with grossly clear lungs and no visible lung mass.  Unremarkable bones.  IMPRESSION:  1.  No acute abnormality. 2.  Minimal hepatic steatosis.  Original Report Authenticated By: Darrol Angel, M.D.     No diagnosis found.    MDM  1 day of RUQ without associated symptoms.  No vomiting or fever.  CT scan shows no gallbladder abnormalities. Normal LFTs, normal lipase, no leukocytosis. No fever.  Results discussed with family and patient.  She is feeling better. Tolerating PO. Return tomorrow for ultrasound of gallbladder discussed with patient.   I personally performed the services described in this documentation, which was scribed in my presence.  The recorded information has been reviewed and considered.    Glynn Octave, MD 07/25/11 [phone]

## 2011-07-25 NOTE — ED Notes (Signed)
Complain of pain in right side. Denies n/v/d or fever

## 2011-07-26 ENCOUNTER — Other Ambulatory Visit (HOSPITAL_COMMUNITY): Payer: Self-pay | Admitting: Emergency Medicine

## 2011-07-26 ENCOUNTER — Ambulatory Visit (HOSPITAL_COMMUNITY)
Admit: 2011-07-26 | Discharge: 2011-07-26 | Disposition: A | Discharge status: Home / Self Care | Payer: No Typology Code available for payment source | Source: Ambulatory Visit | Attending: Emergency Medicine | Admitting: Emergency Medicine

## 2011-07-26 DIAGNOSIS — R109 Unspecified abdominal pain: Secondary | ICD-10-CM

## 2011-07-26 DIAGNOSIS — R1011 Right upper quadrant pain: Secondary | ICD-10-CM | POA: Insufficient documentation

## 2011-08-02 ENCOUNTER — Encounter (HOSPITAL_BASED_OUTPATIENT_CLINIC_OR_DEPARTMENT_OTHER): Payer: No Typology Code available for payment source | Admitting: Oncology

## 2011-08-02 VITALS — BP 115/74 | HR 69 | Temp 97.8°F | Wt 98.0 lb

## 2011-08-02 DIAGNOSIS — G8929 Other chronic pain: Secondary | ICD-10-CM

## 2011-08-02 DIAGNOSIS — J449 Chronic obstructive pulmonary disease, unspecified: Secondary | ICD-10-CM

## 2011-08-02 DIAGNOSIS — F17200 Nicotine dependence, unspecified, uncomplicated: Secondary | ICD-10-CM

## 2011-08-02 DIAGNOSIS — D472 Monoclonal gammopathy: Secondary | ICD-10-CM

## 2011-08-02 NOTE — Progress Notes (Signed)
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 Problem #2 COPD still smoking problem #3 chronic aching pains in her abdomen and chest with numerous emergency room visits in the past CAT scans etc.  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 Her hemoglobin platelets white count calcium renal function are still quite good. Her recent CAT scans of the abdomen and pelvis did not reveal bone lesions according to the report.  Once again I've encouraged her to quit smoking and to followup with [place] in January. I think sometime in 2014 she needs a bone marrow aspirate and biopsy to make sure her plasma cells have not gotten to the myeloma level. We will see her in 6 months

## 2011-08-02 NOTE — Patient Instructions (Addendum)
[name]  [id number] 12/10/1955 Dr. Glenford Peers   [facility]  Discharge Instructions  RECOMMENDATIONS MADE BY THE CONSULTANT AND ANY TEST RESULTS WILL BE SENT TO YOUR REFERRING DOCTOR.   EXAM FINDINGS BY MD TODAY AND SIGNS AND SYMPTOMS TO REPORT TO CLINIC OR PRIMARY MD: Discussion per MD.  You need to stop smoking.  Report increased fatigue, bone pain, night sweats,  recurrent infections, etc.  MEDICATIONS PRESCRIBED: none   SPECIAL INSTRUCTIONS/FOLLOW-UP: Lab work Needed in 6 months, Xray Studies Needed :  Bone Survey in 6 months and Return to Clinic to see MD in 6 months.   I acknowledge that I have been informed and understand all the instructions given to me and received a copy. I do not have any more questions at this time, but understand that I may call the [facility]ty [facility] at [facility] at [phone] during business hours should I have any further questions or need assistance in obtaining follow-up care.    __________________________________________  _____________  __________ Signature of Patient or Authorized Representative            Date                   Time    __________________________________________ Nurse's Signature

## 2011-08-13 ENCOUNTER — Encounter: Payer: Self-pay | Admitting: Oncology

## 2011-08-19 ENCOUNTER — Other Ambulatory Visit (HOSPITAL_COMMUNITY): Payer: Self-pay | Admitting: Physician Assistant

## 2011-08-19 DIAGNOSIS — Q338 Other congenital malformations of lung: Secondary | ICD-10-CM

## 2011-09-08 ENCOUNTER — Ambulatory Visit (HOSPITAL_COMMUNITY)
Admission: RE | Admit: 2011-09-08 | Discharge: 2011-09-08 | Disposition: A | Discharge status: Home / Self Care | Payer: No Typology Code available for payment source | Source: Ambulatory Visit | Attending: Neurology | Admitting: Neurology

## 2011-09-08 ENCOUNTER — Other Ambulatory Visit: Payer: Self-pay | Admitting: Neurology

## 2011-09-08 DIAGNOSIS — M546 Pain in thoracic spine: Secondary | ICD-10-CM | POA: Insufficient documentation

## 2011-09-08 DIAGNOSIS — M545 Low back pain, unspecified: Secondary | ICD-10-CM | POA: Insufficient documentation

## 2011-11-14 ENCOUNTER — Encounter (HOSPITAL_COMMUNITY): Payer: Self-pay | Admitting: Emergency Medicine

## 2011-11-14 ENCOUNTER — Emergency Department (HOSPITAL_COMMUNITY)
Admission: EM | Admit: 2011-11-14 | Discharge: 2011-11-14 | Disposition: A | Discharge status: Home / Self Care | Payer: No Typology Code available for payment source | Attending: Emergency Medicine | Admitting: Emergency Medicine

## 2011-11-14 ENCOUNTER — Emergency Department (HOSPITAL_COMMUNITY): Payer: No Typology Code available for payment source

## 2011-11-14 DIAGNOSIS — J449 Chronic obstructive pulmonary disease, unspecified: Secondary | ICD-10-CM

## 2011-11-14 DIAGNOSIS — J4489 Other specified chronic obstructive pulmonary disease: Secondary | ICD-10-CM | POA: Insufficient documentation

## 2011-11-14 DIAGNOSIS — Z79899 Other long term (current) drug therapy: Secondary | ICD-10-CM | POA: Insufficient documentation

## 2011-11-14 DIAGNOSIS — F17200 Nicotine dependence, unspecified, uncomplicated: Secondary | ICD-10-CM | POA: Insufficient documentation

## 2011-11-14 LAB — CBC WITH DIFFERENTIAL/PLATELET
Basophils Absolute: 0.1 10*3/uL (ref 0.0–0.1)
Basophils Relative: 1 % (ref 0–1)
Eosinophils Absolute: 0.9 10*3/uL — ABNORMAL HIGH (ref 0.0–0.7)
HCT: 42.6 % (ref 36.0–46.0)
Hemoglobin: 14.6 g/dL (ref 12.0–15.0)
MCH: 31.7 pg (ref 26.0–34.0)
MCHC: 34.3 g/dL (ref 30.0–36.0)
Monocytes Absolute: 0.6 10*3/uL (ref 0.1–1.0)
Monocytes Relative: 6 % (ref 3–12)
Neutro Abs: 4.1 10*3/uL (ref 1.7–7.7)
Neutrophils Relative %: 44 % (ref 43–77)
RDW: 14 % (ref 11.5–15.5)

## 2011-11-14 LAB — BASIC METABOLIC PANEL
BUN: 10 mg/dL (ref 6–23)
Creatinine, Ser: 0.64 mg/dL (ref 0.50–1.10)
GFR calc Af Amer: >90 mL/min (ref >90)
GFR calc non Af Amer: >90 mL/min (ref >90)

## 2011-11-14 MED ORDER — PREDNISONE 50 MG PO TABS: ORAL_TABLET | ORAL | Status: DC

## 2011-11-14 MED ORDER — IPRATROPIUM BROMIDE 0.02 % IN SOLN
0.5000 mg | Freq: Once | RESPIRATORY_TRACT | Status: AC
  Administered 2011-11-14: 0.5 mg via RESPIRATORY_TRACT
  Filled 2011-11-14: qty 2.5

## 2011-11-14 MED ORDER — PREDNISONE 20 MG PO TABS
60.0000 mg | ORAL_TABLET | Freq: Once | ORAL | Status: AC
  Administered 2011-11-14: 60 mg via ORAL
  Filled 2011-11-14: qty 3

## 2011-11-14 MED ORDER — ALBUTEROL SULFATE (5 MG/ML) 0.5% IN NEBU
5.0000 mg | INHALATION_SOLUTION | Freq: Once | RESPIRATORY_TRACT | Status: AC
  Administered 2011-11-14: 5 mg via RESPIRATORY_TRACT
  Filled 2011-11-14: qty 1

## 2011-11-14 NOTE — ED Notes (Signed)
States she feels much better now than she did earlier.  Calling son to come pick her up.

## 2011-11-14 NOTE — ED Notes (Addendum)
Difficulty obtaining 12 lead without artifact / tremor due to patients breathing and also shivering.  Shown to Dr Bebe Shaggy - will attempt better tracing (leads left in place) if able to do so later

## 2011-11-14 NOTE — ED Provider Notes (Signed)
History     CSN: [id number]  Arrival date & time 11/14/11  4098   First MD Initiated Contact with Patient 11/14/11 [phone]      Chief Complaint  Patient presents with  . Shortness of Breath     Patient is a 56 y.o. female presenting with shortness of breath. The history is provided by the patient.  Shortness of Breath  The current episode started 2 days ago. The onset was gradual. The problem occurs continuously. The problem has been gradually worsening. The problem is severe. The symptoms are relieved by beta-agonist inhalers. The symptoms are aggravated by activity. Associated symptoms include cough, shortness of breath and wheezing. Pertinent negatives include no chest pain and no fever.  pt reports cough/wheeze/shortness of breath for past 2 days She has h/o COPD and this is similar to prior episodes.  No CP is reported She does not use home oxygen.   En route, EMS administered nebs.     Past Medical History  Diagnosis Date  . COPD (chronic obstructive pulmonary disease)     Past Surgical History  Procedure Date  . Abdominal hysterectomy   . Appendectomy   . Cesarean section     Family History  Problem Relation Age of Onset  . Heart failure Father     History  Substance Use Topics  . Smoking status: Current Every Day Smoker -- 1.0 packs/day    Types: Cigarettes  . Smokeless tobacco: Not on file  . Alcohol Use: Yes     occasionally    OB History    Grav Para Term Preterm Abortions TAB SAB Ect Mult Living   2 2        2       Review of Systems  Constitutional: Negative for fever.  Respiratory: Positive for cough, shortness of breath and wheezing.   Cardiovascular: Negative for chest pain.  All other systems reviewed and are negative.    Allergies  Avelox  Home Medications   Current Outpatient Rx  Name Route Sig Dispense Refill  . ACETAMINOPHEN ER 650 MG PO TBCR Oral Take 650 mg by mouth every 8 (eight) hours as needed. For pain     . ALBUTEROL  SULFATE 1.25 MG/3ML IN NEBU Nebulization Take 1 ampule by nebulization every 6 (six) hours as needed. For shortness of breath    . ALPRAZOLAM 0.5 MG PO TABS Oral Take 0.5 mg by mouth at bedtime as needed. For anxiety    . CALCIUM CARBONATE 600 MG PO TABS Oral Take 600 mg by mouth daily.    Marland Kitchen VITAMIN D 1000 UNITS PO TABS Oral Take 1,000 Units by mouth daily.    . IBUPROFEN 200 MG PO TABS Oral Take 400 mg by mouth every 6 (six) hours as needed. For pain    . PREDNISONE 10 MG PO TABS Oral Take 10 mg by mouth daily. Taking a z-pack, started 07/29/11      BP 144/90  Pulse 94  Temp 97.6 F (36.4 C) (Oral)  Resp 28  Ht 5\' 2"  (1.575 m)  Wt 98 lb (44.453 kg)  BMI 17.92 kg/m2  SpO2 92% BP 139/73  Pulse 104  Temp 97.6 F (36.4 C) (Oral)  Resp 21  Ht 5\' 2"  (1.575 m)  Wt 98 lb (44.453 kg)  BMI 17.92 kg/m2  SpO2 93%  Physical Exam CONSTITUTIONAL: Well developed/well nourished HEAD AND FACE: Normocephalic/atraumatic EYES: EOMI/PERRL ENMT: Mucous membranes moist NECK: supple no meningeal signs SPINE:entire spine nontender CV: S1/S2 noted,  no murmurs/rubs/gallops noted LUNGS: tachypneic, wheezing bilaterally, but she is able to speak to me clearly ABDOMEN: soft, nontender, no rebound or guarding GU:no cva tenderness NEURO: Pt is awake/alert, moves all extremitiesx4 EXTREMITIES: pulses normal, full ROM, no edema SKIN: warm, color normal PSYCH: no abnormalities of mood noted  ED Course  Procedures    Labs Reviewed  CBC WITH DIFFERENTIAL  BASIC METABOLIC PANEL  3:45 AM Pt seen on arrival. She is likely having COPD exacerbation.  Will use nebs and reassess 4:01 AM Pt is not wanting to use her nebs mask.  I advised her the need for use of mask and she may require bipap as she is still tachypneic.  Will follow closely 4:15 AM Her resp effort has improved since given nebs but she is still tachypneic Labs pending at this time 4:47 AM Pt appears much improved.  Her tachypnea has  resolved.  Her wheezing has improved and she is able to speak to me clearly and in no distress Will continue to monitor 6:09 AM Pt resting comfortably. She feels improved.  RA pulse ox >93% She ambulated in the ED without any dyspnea.  She denies active CP She would like to be discharged Advised to use albuterol nebs at home frequently over next 48 hours Continue prednisone Stop smoking I asked her to call her PCP for close followup early next week I doubt ACS/CHF/PE at this time Stable for d/c home  MDM  Nursing notes including past medical history and social history reviewed and considered in documentation Previous records reviewed and considered - h/o COPD on previous ED visits xrays reviewed and considered Labs/vital reviewed and considered     Date: 11/14/2011  Rate: 86  Rhythm: normal sinus rhythm  QRS Axis: normal  Intervals: normal  ST/T Wave abnormalities: nonspecific ST changes  Conduction Disutrbances:none  Narrative Interpretation: poor quality but no STEMI noted.  No significant ST depression noted  Old EKG Reviewed: none available at time of interpretation    Joya Gaskins, MD 11/14/11 [phone]

## 2011-11-14 NOTE — ED Notes (Signed)
Ambulated in hallway - did not require stopping to breathe - dropped to 89-90 briefly adjusted pulse ox probe and 02 sat increased to 92-94 range

## 2011-11-14 NOTE — ED Notes (Signed)
Patient sent her son home and said she would call her mother to pick her up if she is able to go home this am

## 2011-11-14 NOTE — ED Notes (Signed)
Patient complaining of shortness of breath x 2 days, worsening tonight. Was given breathing treatments x 2 via EMS en route to ED.

## 2012-01-19 ENCOUNTER — Emergency Department (HOSPITAL_COMMUNITY)
Admission: EM | Admit: 2012-01-19 | Discharge: 2012-01-19 | Disposition: A | Discharge status: Home / Self Care | Payer: No Typology Code available for payment source | Attending: Emergency Medicine | Admitting: Emergency Medicine

## 2012-01-19 ENCOUNTER — Emergency Department (HOSPITAL_COMMUNITY): Payer: No Typology Code available for payment source

## 2012-01-19 ENCOUNTER — Encounter (HOSPITAL_COMMUNITY): Payer: Self-pay | Admitting: *Deleted

## 2012-01-19 DIAGNOSIS — Z90710 Acquired absence of both cervix and uterus: Secondary | ICD-10-CM | POA: Insufficient documentation

## 2012-01-19 DIAGNOSIS — F17200 Nicotine dependence, unspecified, uncomplicated: Secondary | ICD-10-CM | POA: Insufficient documentation

## 2012-01-19 DIAGNOSIS — R110 Nausea: Secondary | ICD-10-CM | POA: Insufficient documentation

## 2012-01-19 DIAGNOSIS — R05 Cough: Secondary | ICD-10-CM | POA: Insufficient documentation

## 2012-01-19 DIAGNOSIS — J449 Chronic obstructive pulmonary disease, unspecified: Secondary | ICD-10-CM

## 2012-01-19 DIAGNOSIS — J441 Chronic obstructive pulmonary disease with (acute) exacerbation: Secondary | ICD-10-CM | POA: Insufficient documentation

## 2012-01-19 DIAGNOSIS — R059 Cough, unspecified: Secondary | ICD-10-CM | POA: Insufficient documentation

## 2012-01-19 DIAGNOSIS — Z79899 Other long term (current) drug therapy: Secondary | ICD-10-CM | POA: Insufficient documentation

## 2012-01-19 LAB — COMPREHENSIVE METABOLIC PANEL
ALT: 16 U/L (ref 0–35)
AST: 17 U/L (ref 0–37)
Albumin: 4.2 g/dL (ref 3.5–5.2)
Alkaline Phosphatase: 111 U/L (ref 39–117)
CO2: 27 mEq/L (ref 19–32)
Chloride: 104 mEq/L (ref 96–112)
Creatinine, Ser: 0.6 mg/dL (ref 0.50–1.10)
Potassium: 3.8 mEq/L (ref 3.5–5.1)
Sodium: 139 mEq/L (ref 135–145)
Total Bilirubin: 0.4 mg/dL (ref 0.3–1.2)

## 2012-01-19 LAB — CBC WITH DIFFERENTIAL/PLATELET
Basophils Absolute: 0.1 10*3/uL (ref 0.0–0.1)
Basophils Relative: 1 % (ref 0–1)
Lymphocytes Relative: 16 % (ref 12–46)
MCHC: 33.9 g/dL (ref 30.0–36.0)
Neutro Abs: 7.8 10*3/uL — ABNORMAL HIGH (ref 1.7–7.7)
Neutrophils Relative %: 76 % (ref 43–77)
Platelets: 314 10*3/uL (ref 150–400)
RDW: 13.4 % (ref 11.5–15.5)
WBC: 10.2 10*3/uL (ref 4.0–10.5)

## 2012-01-19 MED ORDER — ONDANSETRON HCL 4 MG/2ML IJ SOLN
INTRAMUSCULAR | Status: AC
  Administered 2012-01-19: 4 mg via INTRAVENOUS
  Filled 2012-01-19: qty 2

## 2012-01-19 MED ORDER — ALBUTEROL SULFATE (5 MG/ML) 0.5% IN NEBU
5.0000 mg | INHALATION_SOLUTION | Freq: Once | RESPIRATORY_TRACT | Status: AC
  Administered 2012-01-19: 5 mg via RESPIRATORY_TRACT
  Filled 2012-01-19: qty 1

## 2012-01-19 MED ORDER — PREDNISONE 10 MG PO TABS: 20.0000 mg | ORAL_TABLET | Freq: Every day | ORAL | Status: DC

## 2012-01-19 MED ORDER — IPRATROPIUM BROMIDE 0.02 % IN SOLN
0.5000 mg | Freq: Once | RESPIRATORY_TRACT | Status: AC
  Administered 2012-01-19: 0.5 mg via RESPIRATORY_TRACT
  Filled 2012-01-19: qty 2.5

## 2012-01-19 MED ORDER — ONDANSETRON HCL 4 MG/2ML IJ SOLN
4.0000 mg | Freq: Once | INTRAMUSCULAR | Status: AC
  Administered 2012-01-19: 4 mg via INTRAVENOUS

## 2012-01-19 MED ORDER — IPRATROPIUM BROMIDE HFA 17 MCG/ACT IN AERS: 2.0000 | INHALATION_SPRAY | Freq: Four times a day (QID) | RESPIRATORY_TRACT | Status: DC

## 2012-01-19 NOTE — ED Notes (Signed)
SOB. Had one albuterol tx at home. Had two duonebs in route.  IV Solumedrol 125mg  enroute also. RA o2 at this time is 90% Initial O2sat was mid-80's per EMS. Nausea. First started last night, became worse this morning.

## 2012-01-19 NOTE — ED Provider Notes (Signed)
History   This chart was scribed for Benny Lennert, MD, by Frederik Pear, ER scribe. The patient was seen in room APA12/APA12 and the patient's care was started at 1429.    CSN: [id number]  Arrival date & time 01/19/12  1421   First MD Initiated Contact with Patient 01/19/12 1429      Chief Complaint  Patient presents with  . Shortness of Breath    (Consider location/radiation/quality/duration/timing/severity/associated sxs/prior treatment) Patient is a 57 y.o. female presenting with shortness of breath. The history is provided by the patient. No language interpreter was used.  Shortness of Breath  The current episode started yesterday. The onset was sudden. The problem occurs continuously. The problem has been gradually worsening. The problem is moderate. Associated symptoms include cough and shortness of breath. Pertinent negatives include no chest pain.    Misty Richardson is a 57 y.o. female brought in by EMS who presents to the Emergency Department complaining of gradually worsening SOB with associated productive cough and nausea that began last night. She states that she took 3 breathing treatments PTA. EMS states that they gave her one breathing treatment in route to ED. She denies any hospital admits for SOB in the past. She is a current, less than 1/2 a day smoker.    Past Medical History  Diagnosis Date  . COPD (chronic obstructive pulmonary disease)     Past Surgical History  Procedure Date  . Abdominal hysterectomy   . Appendectomy   . Cesarean section     Family History  Problem Relation Age of Onset  . Heart failure Father     History  Substance Use Topics  . Smoking status: Current Every Day Smoker -- 1.0 packs/day    Types: Cigarettes  . Smokeless tobacco: Not on file  . Alcohol Use: Yes     Comment: occasionally    OB History    Grav Para Term Preterm Abortions TAB SAB Ect Mult Living   2 2        2       Review of Systems  Constitutional:  Negative for fatigue.  HENT: Negative for congestion, sinus pressure and ear discharge.   Eyes: Negative for discharge.  Respiratory: Positive for cough and shortness of breath.   Cardiovascular: Negative for chest pain.  Gastrointestinal: Negative for abdominal pain and diarrhea.  Genitourinary: Negative for frequency and hematuria.  Musculoskeletal: Negative for back pain.  Skin: Negative for rash.  Neurological: Negative for seizures and headaches.  Hematological: Negative.   Psychiatric/Behavioral: Negative for hallucinations.  All other systems reviewed and are negative.    Allergies  Avelox  Home Medications   Current Outpatient Rx  Name  Route  Sig  Dispense  Refill  . ACETAMINOPHEN ER 650 MG PO TBCR   Oral   Take 650 mg by mouth every 8 (eight) hours as needed. For pain          . ALBUTEROL SULFATE 1.25 MG/3ML IN NEBU   Nebulization   Take 1 ampule by nebulization every 6 (six) hours as needed. For shortness of breath         . ALPRAZOLAM 0.5 MG PO TABS   Oral   Take 0.5 mg by mouth at bedtime as needed. For anxiety         . CALCIUM CARBONATE 600 MG PO TABS   Oral   Take 600 mg by mouth daily.         Marland Kitchen VITAMIN D 1000  UNITS PO TABS   Oral   Take 1,000 Units by mouth daily.         . IBUPROFEN 200 MG PO TABS   Oral   Take 400 mg by mouth every 6 (six) hours as needed. For pain         . PREDNISONE 10 MG PO TABS   Oral   Take 10 mg by mouth daily. Taking a z-pack, started 07/29/11         . PREDNISONE 50 MG PO TABS      One tablet PO daily for 4 days   4 tablet   0     BP 138/84  Pulse 94  Resp 20  Ht 5\' 2"  (1.575 m)  Wt 98 lb (44.453 kg)  BMI 17.92 kg/m2  SpO2 93%  Physical Exam  Nursing note and vitals reviewed. Constitutional: She is oriented to person, place, and time. She appears well-developed.  HENT:  Head: Normocephalic and atraumatic.  Eyes: Conjunctivae normal and EOM are normal. No scleral icterus.  Neck: Neck  supple. No thyromegaly present.  Cardiovascular: Normal rate and regular rhythm.  Exam reveals no gallop and no friction rub.   No murmur heard. Pulmonary/Chest: No stridor. She has no wheezes. She has no rales. She exhibits no tenderness.       She has moderate bilateral wheezes.  Abdominal: She exhibits no distension. There is no tenderness. There is no rebound.  Musculoskeletal: Normal range of motion. She exhibits no edema.  Lymphadenopathy:    She has no cervical adenopathy.  Neurological: She is oriented to person, place, and time. Coordination normal.  Skin: No rash noted. No erythema.  Psychiatric: She has a normal mood and affect. Her behavior is normal.    ED Course  Procedures (including critical care time)  DIAGNOSTIC STUDIES: Oxygen Saturation is 93% on room air, adequate by my interpretation.    COORDINATION OF CARE:  14:48- Discussed planned course of treatment with the patient, including a breathing treatment, who is agreeable at this time.  14:54- Medication Orders- ondansetron (Zofran) 4 mg/59ml injection.  15:00- Medication Orders- albuterol (Proventil) (5mg /ml) 0.5% nebulizer solution 5 mg- Once, ipratropium (Atrovent) nebulizer solution 0.5 mg- Once.  15:15- Medication Orders- ondansetron (Zofran) injection 4 mg- Once.  Results for orders placed during the hospital encounter of 01/19/12  CBC WITH DIFFERENTIAL      Component Value Range   WBC 10.2  4.0 - 10.5 K/uL   RBC 4.24  3.87 - 5.11 MIL/uL   Hemoglobin 13.5  12.0 - 15.0 g/dL   HCT 62.9  52.8 - 41.3 %   MCV 93.9  78.0 - 100.0 fL   MCH 31.8  26.0 - 34.0 pg   MCHC 33.9  30.0 - 36.0 g/dL   RDW 24.4  01.0 - 27.2 %   Platelets 314  150 - 400 K/uL   Neutrophils Relative 76  43 - 77 %   Neutro Abs 7.8 (*) 1.7 - 7.7 K/uL   Lymphocytes Relative 16  12 - 46 %   Lymphs Abs 1.7  0.7 - 4.0 K/uL   Monocytes Relative 3  3 - 12 %   Monocytes Absolute 0.3  0.1 - 1.0 K/uL   Eosinophils Relative 4  0 - 5 %    Eosinophils Absolute 0.4  0.0 - 0.7 K/uL   Basophils Relative 1  0 - 1 %   Basophils Absolute 0.1  0.0 - 0.1 K/uL  COMPREHENSIVE METABOLIC PANEL  Component Value Range   Sodium 139  135 - 145 mEq/L   Potassium 3.8  3.5 - 5.1 mEq/L   Chloride 104  96 - 112 mEq/L   CO2 27  19 - 32 mEq/L   Glucose, Bld 101 (*) 70 - 99 mg/dL   BUN 6  6 - 23 mg/dL   Creatinine, Ser 1.61  0.50 - 1.10 mg/dL   Calcium 9.0  8.4 - 09.6 mg/dL   Total Protein 7.9  6.0 - 8.3 g/dL   Albumin 4.2  3.5 - 5.2 g/dL   AST 17  0 - 37 U/L   ALT 16  0 - 35 U/L   Alkaline Phosphatase 111  39 - 117 U/L   Total Bilirubin 0.4  0.3 - 1.2 mg/dL   GFR calc non Af Amer >90  >90 mL/min   GFR calc Af Amer >90  >90 mL/min   Dg Chest 2 View  01/19/2012  *RADIOLOGY REPORT*  Clinical Data: Short of breath  CHEST - 2 VIEW  Comparison: 11/14/2011  Findings: The heart size and mediastinal contours are within normal limits. Lungs appear hyperexpanded and there is interstitial change of COPD.  Both lungs are clear.  The visualized skeletal structures are unremarkable.  IMPRESSION:  1.  No acute cardiopulmonary abnormalities.   Original Report Authenticated By: Signa Kell, M.D.       Labs Reviewed - No data to display No results found.   No diagnosis found.    MDM    The chart was scribed for me under my direct supervision.  I personally performed the history, physical, and medical decision making and all procedures in the evaluation of this patient.Benny Lennert, MD 01/19/12 [phone]

## 2012-01-19 NOTE — ED Notes (Signed)
Patient with no complaints at this time. Respirations even and unlabored. Skin warm/dry. Discharge instructions reviewed with patient at this time. Patient given opportunity to voice concerns/ask questions. IV removed per policy and band-aid applied to site. Patient discharged at this time and left Emergency Department with steady gait.  

## 2012-01-31 ENCOUNTER — Encounter (HOSPITAL_COMMUNITY): Payer: BC Managed Care – PPO | Attending: Oncology

## 2012-01-31 ENCOUNTER — Ambulatory Visit (HOSPITAL_COMMUNITY)
Admission: RE | Admit: 2012-01-31 | Discharge: 2012-01-31 | Disposition: A | Discharge status: Home / Self Care | Payer: BC Managed Care – PPO | Source: Ambulatory Visit | Attending: Oncology | Admitting: Oncology

## 2012-01-31 DIAGNOSIS — D472 Monoclonal gammopathy: Secondary | ICD-10-CM

## 2012-01-31 DIAGNOSIS — F17200 Nicotine dependence, unspecified, uncomplicated: Secondary | ICD-10-CM | POA: Insufficient documentation

## 2012-01-31 DIAGNOSIS — J449 Chronic obstructive pulmonary disease, unspecified: Secondary | ICD-10-CM | POA: Insufficient documentation

## 2012-01-31 DIAGNOSIS — J4489 Other specified chronic obstructive pulmonary disease: Secondary | ICD-10-CM | POA: Insufficient documentation

## 2012-01-31 LAB — COMPREHENSIVE METABOLIC PANEL
Albumin: 3.6 g/dL (ref 3.5–5.2)
BUN: 12 mg/dL (ref 6–23)
CO2: 28 mEq/L (ref 19–32)
Calcium: 9.7 mg/dL (ref 8.4–10.5)
Chloride: 103 mEq/L (ref 96–112)
Creatinine, Ser: 0.69 mg/dL (ref 0.50–1.10)
GFR calc non Af Amer: >90 mL/min (ref >90)
Total Bilirubin: 0.4 mg/dL (ref 0.3–1.2)

## 2012-01-31 LAB — DIFFERENTIAL
Basophils Absolute: 0.1 10*3/uL (ref 0.0–0.1)
Basophils Relative: 1 % (ref 0–1)
Neutro Abs: 3.3 10*3/uL (ref 1.7–7.7)
Neutrophils Relative %: 45 % (ref 43–77)

## 2012-01-31 LAB — CBC
HCT: 42.3 % (ref 36.0–46.0)
MCH: 31.6 pg (ref 26.0–34.0)
MCV: 94.2 fL (ref 78.0–100.0)
RDW: 13.7 % (ref 11.5–15.5)
WBC: 7.5 10*3/uL (ref 4.0–10.5)

## 2012-01-31 NOTE — Progress Notes (Signed)
Labs drawn today for B87mic,cbc/diff,cmp,kllc,mm panel

## 2012-02-01 LAB — KAPPA/LAMBDA LIGHT CHAINS
Kappa, lambda light chain ratio: 13.83 — ABNORMAL HIGH (ref 0.26–1.65)
Lambda free light chains: 0.81 mg/dL (ref 0.57–2.63)

## 2012-02-02 ENCOUNTER — Encounter (HOSPITAL_BASED_OUTPATIENT_CLINIC_OR_DEPARTMENT_OTHER): Payer: BC Managed Care – PPO | Admitting: Oncology

## 2012-02-02 ENCOUNTER — Encounter (HOSPITAL_COMMUNITY): Payer: Self-pay | Admitting: Oncology

## 2012-02-02 VITALS — BP 111/79 | HR 79 | Temp 97.3°F | Resp 16 | Wt 99.8 lb

## 2012-02-02 DIAGNOSIS — D472 Monoclonal gammopathy: Secondary | ICD-10-CM

## 2012-02-02 LAB — MULTIPLE MYELOMA PANEL, SERUM
Gamma Globulin: 19.6 % — ABNORMAL HIGH (ref 11.1–18.8)
IgG (Immunoglobin G), Serum: 1550 mg/dL (ref 690–1700)
M-Spike, %: 1.12 g/dL

## 2012-02-02 NOTE — Progress Notes (Signed)
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 Problem #2 COPD still smoking problem #3 chronic aching pains her abdomen and chest with numerous ER visits in the past She is once again accompanied by her mother to go over her blood work and bone survey. Her bone survey continues to look good. 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 Her light chains are less than they were and there is no increase in her monoclonal protein etc. Her beta-2 microglobulin levels also lower. Her other labs are stable. We will therefore see her back in August for repeat laboratory work. I do not think she needs a bone marrow biopsy this year and she may 89 Less thinks change. Right now she needs to be observed.  She also needs to quit smoking unequivocally which we talked about extensively today.

## 2012-02-02 NOTE — Patient Instructions (Addendum)
[facility] Discharge Instructions  RECOMMENDATIONS MADE BY THE CONSULTANT AND ANY TEST RESULTS WILL BE SENT TO YOUR REFERRING PHYSICIAN.  EXAM FINDINGS BY THE PHYSICIAN TODAY AND SIGNS OR SYMPTOMS TO REPORT TO CLINIC OR PRIMARY PHYSICIAN:Exam and discussion by MD.  Need to stop smoking.  MEDICATIONS PRESCRIBED:  none  INSTRUCTIONS GIVEN AND DISCUSSED: Report recurring infections, night sweats, etc.  SPECIAL INSTRUCTIONS/FOLLOW-UP: Blood work in August and to be seen in follow-up afterwards.  Thank you for choosing [facility] to provide your oncology and hematology care.  To afford each patient quality time with our providers, please arrive at least 15 minutes before your scheduled appointment time.  With your help, our goal is to use those 15 minutes to complete the necessary work-up to ensure our physicians have the information they need to help with your evaluation and healthcare recommendations.    Effective January 1st, 2014, we ask that you re-schedule your appointment with our physicians should you arrive 10 or more minutes late for your appointment.  We strive to give you quality time with our providers, and arriving late affects you and other patients whose appointments are after yours.    Again, thank you for choosing [facility].  Our hope is that these requests will decrease the amount of time that you wait before being seen by our physicians.       _____________________________________________________________  Should you have questions after your visit to [facility], please contact our office at [phone] between the hours of 8:30 a.m. and 5:00 p.m.  Voicemails left after 4:30 p.m. will not be returned until the following business day.  For prescription refill requests, have your pharmacy contact our office with your prescription refill request.

## 2012-04-18 ENCOUNTER — Emergency Department (HOSPITAL_COMMUNITY): Payer: Medicare Other

## 2012-04-18 ENCOUNTER — Emergency Department (HOSPITAL_COMMUNITY)
Admission: EM | Admit: 2012-04-18 | Discharge: 2012-04-18 | Disposition: A | Discharge status: Home / Self Care | Payer: Medicare Other | Attending: Emergency Medicine | Admitting: Emergency Medicine

## 2012-04-18 ENCOUNTER — Encounter (HOSPITAL_COMMUNITY): Payer: Self-pay | Admitting: *Deleted

## 2012-04-18 DIAGNOSIS — F17200 Nicotine dependence, unspecified, uncomplicated: Secondary | ICD-10-CM | POA: Insufficient documentation

## 2012-04-18 DIAGNOSIS — J209 Acute bronchitis, unspecified: Secondary | ICD-10-CM | POA: Insufficient documentation

## 2012-04-18 DIAGNOSIS — J441 Chronic obstructive pulmonary disease with (acute) exacerbation: Secondary | ICD-10-CM

## 2012-04-18 DIAGNOSIS — Z79899 Other long term (current) drug therapy: Secondary | ICD-10-CM | POA: Insufficient documentation

## 2012-04-18 DIAGNOSIS — Z8709 Personal history of other diseases of the respiratory system: Secondary | ICD-10-CM | POA: Insufficient documentation

## 2012-04-18 DIAGNOSIS — J440 Chronic obstructive pulmonary disease with acute lower respiratory infection: Secondary | ICD-10-CM | POA: Insufficient documentation

## 2012-04-18 DIAGNOSIS — J3489 Other specified disorders of nose and nasal sinuses: Secondary | ICD-10-CM | POA: Insufficient documentation

## 2012-04-18 DIAGNOSIS — R079 Chest pain, unspecified: Secondary | ICD-10-CM | POA: Insufficient documentation

## 2012-04-18 LAB — POCT I-STAT, CHEM 8
Glucose, Bld: 94 mg/dL (ref 70–99)
HCT: 44 % (ref 36.0–46.0)
Hemoglobin: 15 g/dL (ref 12.0–15.0)
Potassium: 4 mEq/L (ref 3.5–5.1)
TCO2: 26 mmol/L (ref 0–100)

## 2012-04-18 LAB — POCT I-STAT TROPONIN I: Troponin i, poc: 0 ng/mL (ref 0.00–0.08)

## 2012-04-18 MED ORDER — IPRATROPIUM BROMIDE 0.02 % IN SOLN
0.5000 mg | Freq: Once | RESPIRATORY_TRACT | Status: AC
  Administered 2012-04-18: 0.5 mg via RESPIRATORY_TRACT
  Filled 2012-04-18: qty 2.5

## 2012-04-18 MED ORDER — BENZONATATE 100 MG PO CAPS: 100.0000 mg | ORAL_CAPSULE | Freq: Three times a day (TID) | ORAL | Status: DC | PRN

## 2012-04-18 MED ORDER — DOXYCYCLINE HYCLATE 100 MG PO TABS: 100.0000 mg | ORAL_TABLET | Freq: Two times a day (BID) | ORAL | Status: DC

## 2012-04-18 MED ORDER — ALBUTEROL SULFATE HFA 108 (90 BASE) MCG/ACT IN AERS
2.0000 | INHALATION_SPRAY | RESPIRATORY_TRACT | Status: AC
  Administered 2012-04-18: 2 via RESPIRATORY_TRACT
  Filled 2012-04-18: qty 6.7

## 2012-04-18 MED ORDER — PREDNISONE 50 MG PO TABS
60.0000 mg | ORAL_TABLET | Freq: Once | ORAL | Status: AC
  Administered 2012-04-18: 60 mg via ORAL
  Filled 2012-04-18: qty 1

## 2012-04-18 MED ORDER — PREDNISONE 20 MG PO TABS: 40.0000 mg | ORAL_TABLET | Freq: Every day | ORAL | Status: DC

## 2012-04-18 MED ORDER — ALBUTEROL SULFATE (5 MG/ML) 0.5% IN NEBU
5.0000 mg | INHALATION_SOLUTION | Freq: Once | RESPIRATORY_TRACT | Status: AC
  Administered 2012-04-18: 5 mg via RESPIRATORY_TRACT
  Filled 2012-04-18: qty 1

## 2012-04-18 NOTE — ED Provider Notes (Signed)
History     CSN: [id number]  Arrival date & time 04/18/12  1553   First MD Initiated Contact with Patient 04/18/12 1720      Chief Complaint  Patient presents with  . Cough     HPI Pt was seen at 1730.   Per pt, c/o gradual onset and worsening of persistent cough, wheezing and SOB for the past 2 to 3 days.  Describes her symptoms as "my COPD is acting up."  Has been using home nebs with transient relief. States she has run out of her MDI.  Pt also c/o constant generalized chest "burning" pain for the past 2 days.  Describes this pain as "when I know I have bronchitis and need an antibiotic."  Denies palpitations, no back pain, no abd pain, no N/V/D, no fevers, no rash.    Past Medical History  Diagnosis Date  . COPD (chronic obstructive pulmonary disease)     Past Surgical History  Procedure Laterality Date  . Abdominal hysterectomy    . Appendectomy    . Cesarean section      Family History  Problem Relation Age of Onset  . Heart failure Father     History  Substance Use Topics  . Smoking status: Current Every Day Smoker -- 1.00 packs/day    Types: Cigarettes  . Smokeless tobacco: Not on file  . Alcohol Use: Yes     Comment: occasionally    OB History   Grav Para Term Preterm Abortions TAB SAB Ect Mult Living   2 2        2       Review of Systems ROS: Statement: All systems negative except as marked or noted in the HPI; Constitutional: Negative for fever and chills. ; ; Eyes: Negative for eye pain, redness and discharge. ; ; ENMT: Negative for ear pain, hoarseness, nasal congestion, sinus pressure and sore throat. ; ; Cardiovascular: +CP. Negative for palpitations, diaphoresis, and peripheral edema. ; ; Respiratory: +cough, wheezing, SOB. Negative for stridor. ; ; Gastrointestinal: Negative for nausea, vomiting, diarrhea, abdominal pain, blood in stool, hematemesis, jaundice and rectal bleeding. . ; ; Genitourinary: Negative for dysuria, flank pain and hematuria. ;  ; Musculoskeletal: Negative for back pain and neck pain. Negative for swelling and trauma.; ; Skin: Negative for pruritus, rash, abrasions, blisters, bruising and skin lesion.; ; Neuro: Negative for headache, lightheadedness and neck stiffness. Negative for weakness, altered level of consciousness , altered mental status, extremity weakness, paresthesias, involuntary movement, seizure and syncope.       Allergies  Avelox  Home Medications   Current Outpatient Rx  Name  Route  Sig  Dispense  Refill  . acetaminophen (TYLENOL) 500 MG tablet   Oral   Take 1,000 mg by mouth daily as needed for pain.         Marland Kitchen albuterol (ACCUNEB) 1.25 MG/3ML nebulizer solution   Nebulization   Take 1 ampule by nebulization every 6 (six) hours as needed. For shortness of breath         . albuterol (PROVENTIL HFA;VENTOLIN HFA) 108 (90 BASE) MCG/ACT inhaler   Inhalation   Inhale 2 puffs into the lungs every 6 (six) hours as needed for wheezing or shortness of breath.         . ALPRAZolam (XANAX) 0.5 MG tablet   Oral   Take 0.5 mg by mouth at bedtime as needed. For anxiety         . tiotropium (SPIRIVA) 18 MCG inhalation  capsule   Inhalation   Place 18 mcg into inhaler and inhale daily.           BP 112/78  Pulse 76  Temp(Src) 98.6 F (37 C) (Oral)  Resp 18  Ht 5\' 2"  (1.575 m)  Wt 98 lb (44.453 kg)  BMI 17.92 kg/m2  SpO2 96%  Physical Exam 1735: Physical examination:  Nursing notes reviewed; Vital signs and O2 SAT reviewed;  Constitutional: Well developed, Well nourished, Well hydrated, In no acute distress; Head:  Normocephalic, atraumatic; Eyes: EOMI, PERRL, No scleral icterus; ENMT: TM's clear bilat. +edemetous nasal turbinates bilat with clear rhinorrhea.  Mouth and pharynx normal, Mucous membranes moist; Neck: Supple, Full range of motion, No lymphadenopathy; Cardiovascular: Regular rate and rhythm, No gallop; Respiratory: Breath sounds diminished & equal bilaterally, faint  scattered wheezes. No audible wheezing. Speaking full sentences with ease, Normal respiratory effort/excursion; Chest: Nontender, Movement normal; Abdomen: Soft, Nontender, Nondistended, Normal bowel sounds; Genitourinary: No CVA tenderness; Extremities: Pulses normal, No tenderness, No edema, No calf edema or asymmetry.; Neuro: AA&Ox3, Major CN grossly intact.  Speech clear. Climbs on and off stretcher easily by herself. Gait steady. No gross focal motor or sensory deficits in extremities.; Skin: Color normal, Warm, Dry.    ED Course  Procedures    MDM  MDM Reviewed: previous chart, nursing note and vitals Reviewed previous: ECG Interpretation: ECG, labs and x-ray    Date: 04/18/2012  Rate: 77  Rhythm: normal sinus rhythm  QRS Axis: normal  Intervals: normal  ST/T Wave abnormalities: normal  Conduction Disutrbances:none  Narrative Interpretation:   Old EKG Reviewed: unchanged; no significant changes from previous EKG dated 11/14/2011.  Results for orders placed during the hospital encounter of 04/18/12  POCT I-STAT TROPONIN I      Result Value Range   Troponin i, poc 0.00  0.00 - 0.08 ng/mL   Comment 3           POCT I-STAT, CHEM 8      Result Value Range   Sodium 143  135 - 145 mEq/L   Potassium 4.0  3.5 - 5.1 mEq/L   Chloride 109  96 - 112 mEq/L   BUN 8  6 - 23 mg/dL   Creatinine, Ser 1.61  0.50 - 1.10 mg/dL   Glucose, Bld 94  70 - 99 mg/dL   Calcium, Ion 0.96  1.12 - 1.23 mmol/L   TCO2 26  0 - 100 mmol/L   Hemoglobin 15.0  12.0 - 15.0 g/dL   HCT 04.5  40.9 - 81.1 %   Dg Chest 2 View 04/18/2012  *RADIOLOGY REPORT*  Clinical Data: Cough, COPD  CHEST - 2 VIEW  Comparison: 01/19/2012  Findings: Chronic interstitial markings/emphysematous changes with hyperinflation.  Biapical pleural parenchymal scarring.  No focal consolidation. No pleural effusion or pneumothorax.  The heart is normal in size.  Visualized osseous structures are within normal limits.  IMPRESSION: No  evidence of acute cardiopulmonary disease.   Original Report Authenticated By: Charline Bills, M.D.     1845:  Pt states she "feels better" after neb and steroid.  NAD, lungs CTA bilat, no wheezing, resps easy, speaking full sentences, Sats 100% R/A. Pt talking on her phone and with family at bedside. Wants to go home now. Dx and testing d/w pt and family.  Questions answered.  Verb understanding, agreeable to d/c home with outpt f/u.          Laray Anger, DO 04/20/12 2113

## 2012-04-18 NOTE — ED Notes (Signed)
Pt c/o cough, congestion, wheezing, sob that started a few days ago, chest burning sensation that started yesterday.

## 2012-04-18 NOTE — ED Notes (Signed)
Instructions, prescriptions and f/u information given/reviewed - verbalizes understanding.  

## 2012-05-22 ENCOUNTER — Emergency Department (HOSPITAL_COMMUNITY): Payer: Medicare Other

## 2012-05-22 ENCOUNTER — Emergency Department (HOSPITAL_COMMUNITY)
Admission: EM | Admit: 2012-05-22 | Discharge: 2012-05-22 | Disposition: A | Discharge status: Home / Self Care | Payer: Medicare Other | Attending: Emergency Medicine | Admitting: Emergency Medicine

## 2012-05-22 ENCOUNTER — Encounter (HOSPITAL_COMMUNITY): Payer: Self-pay | Admitting: *Deleted

## 2012-05-22 DIAGNOSIS — J449 Chronic obstructive pulmonary disease, unspecified: Secondary | ICD-10-CM

## 2012-05-22 DIAGNOSIS — J441 Chronic obstructive pulmonary disease with (acute) exacerbation: Secondary | ICD-10-CM | POA: Insufficient documentation

## 2012-05-22 DIAGNOSIS — Z79899 Other long term (current) drug therapy: Secondary | ICD-10-CM | POA: Insufficient documentation

## 2012-05-22 DIAGNOSIS — F17200 Nicotine dependence, unspecified, uncomplicated: Secondary | ICD-10-CM | POA: Insufficient documentation

## 2012-05-22 LAB — BASIC METABOLIC PANEL
Calcium: 9.4 mg/dL (ref 8.4–10.5)
GFR calc Af Amer: >90 mL/min (ref >90)
GFR calc non Af Amer: >90 mL/min (ref >90)
Glucose, Bld: 133 mg/dL — ABNORMAL HIGH (ref 70–99)
Potassium: 3.1 mEq/L — ABNORMAL LOW (ref 3.5–5.1)
Sodium: 137 mEq/L (ref 135–145)

## 2012-05-22 LAB — CBC
Hemoglobin: 14.5 g/dL (ref 12.0–15.0)
MCHC: 34.1 g/dL (ref 30.0–36.0)
Platelets: 283 10*3/uL (ref 150–400)
RDW: 13.6 % (ref 11.5–15.5)

## 2012-05-22 MED ORDER — PREDNISONE 10 MG PO TABS: 20.0000 mg | ORAL_TABLET | Freq: Every day | ORAL | Status: DC

## 2012-05-22 MED ORDER — POTASSIUM CHLORIDE CRYS ER 20 MEQ PO TBCR
60.0000 meq | EXTENDED_RELEASE_TABLET | Freq: Once | ORAL | Status: AC
  Administered 2012-05-22: 60 meq via ORAL
  Filled 2012-05-22: qty 3

## 2012-05-22 MED ORDER — ALBUTEROL SULFATE (5 MG/ML) 0.5% IN NEBU
INHALATION_SOLUTION | RESPIRATORY_TRACT | Status: AC
  Administered 2012-05-22: 10 mg
  Filled 2012-05-22: qty 2

## 2012-05-22 MED ORDER — HYDROCODONE-ACETAMINOPHEN 5-325 MG PO TABS
1.0000 | ORAL_TABLET | Freq: Once | ORAL | Status: AC
  Administered 2012-05-22: 1 via ORAL
  Filled 2012-05-22: qty 1

## 2012-05-22 MED ORDER — IPRATROPIUM BROMIDE 0.02 % IN SOLN
RESPIRATORY_TRACT | Status: AC
  Administered 2012-05-22: 0.5 mg
  Filled 2012-05-22: qty 2.5

## 2012-05-22 NOTE — ED Notes (Addendum)
Pt has been feeling bad all weekend. Got worse today. Hx of COPD. Pt took 4 neb treatments today at home. Pt was giving 1 duo neb,  2 albuterol treatments, 125 solu-medrol en route.

## 2012-05-22 NOTE — ED Notes (Signed)
States she feels somewhat better but her back is killing her.

## 2012-05-22 NOTE — ED Notes (Signed)
Respiratory still in with pt

## 2012-05-22 NOTE — ED Notes (Signed)
Pt finished breathing treatment  

## 2012-05-22 NOTE — ED Provider Notes (Signed)
History     CSN: [id number]  Arrival date & time 05/22/12  0418   First MD Initiated Contact with Patient 05/22/12 [phone]      Chief Complaint  Patient presents with  . Shortness of Breath    (Consider location/radiation/quality/duration/timing/severity/associated sxs/prior treatment) HPI Misty Richardson is a 57 y.o. female with a h/o COPD who continues to smoke , brought in by ambulance, who presents to the Emergency Department complaining of shortness of breath and wheezing that began on Friday and continued to get worse. She has used her nebulizer 4 times at home during the course of the day yesterday and was unable to sleep tonight. Has been given 1 duo neb, albuterol x 2, and 125 mg solumedrol by EMS. Denies fever, chills.   PCP Dr. Sherwood Gambler  Past Medical History  Diagnosis Date  . COPD (chronic obstructive pulmonary disease)     Past Surgical History  Procedure Laterality Date  . Abdominal hysterectomy    . Appendectomy    . Cesarean section      Family History  Problem Relation Age of Onset  . Heart failure Father     History  Substance Use Topics  . Smoking status: Current Every Day Smoker -- 1.00 packs/day    Types: Cigarettes  . Smokeless tobacco: Not on file  . Alcohol Use: Yes     Comment: occasionally    OB History   Grav Para Term Preterm Abortions TAB SAB Ect Mult Living   2 2        2       Review of Systems  Constitutional: Negative for fever.       10 Systems reviewed and are negative for acute change except as noted in the HPI.  HENT: Negative for congestion.   Eyes: Negative for discharge and redness.  Respiratory: Positive for shortness of breath and wheezing. Negative for cough.   Cardiovascular: Negative for chest pain.  Gastrointestinal: Negative for vomiting and abdominal pain.  Musculoskeletal: Negative for back pain.  Skin: Negative for rash.  Neurological: Negative for syncope, numbness and headaches.  Psychiatric/Behavioral:        No behavior change.    Allergies  Avelox  Home Medications   Current Outpatient Rx  Name  Route  Sig  Dispense  Refill  . acetaminophen (TYLENOL) 500 MG tablet   Oral   Take 1,000 mg by mouth daily as needed for pain.         Marland Kitchen albuterol (ACCUNEB) 1.25 MG/3ML nebulizer solution   Nebulization   Take 1 ampule by nebulization every 6 (six) hours as needed. For shortness of breath         . albuterol (PROVENTIL HFA;VENTOLIN HFA) 108 (90 BASE) MCG/ACT inhaler   Inhalation   Inhale 2 puffs into the lungs every 6 (six) hours as needed for wheezing or shortness of breath.         . ALPRAZolam (XANAX) 0.5 MG tablet   Oral   Take 0.5 mg by mouth at bedtime as needed. For anxiety         . benzonatate (TESSALON) 100 MG capsule   Oral   Take 1 capsule (100 mg total) by mouth 3 (three) times daily as needed for cough.   15 capsule   0   . doxycycline (VIBRA-TABS) 100 MG tablet   Oral   Take 1 tablet (100 mg total) by mouth 2 (two) times daily.   14 tablet   0   .  predniSONE (DELTASONE) 20 MG tablet   Oral   Take 2 tablets (40 mg total) by mouth daily.   10 tablet   0   . tiotropium (SPIRIVA) 18 MCG inhalation capsule   Inhalation   Place 18 mcg into inhaler and inhale daily.           BP 153/80  Pulse 129  Resp 32  Ht 5' (1.524 m)  Wt 98 lb (44.453 kg)  BMI 19.14 kg/m2  SpO2 96%  Physical Exam  Nursing note and vitals reviewed. Constitutional: She appears well-developed and well-nourished.  Awake, alert, tripod position, short of breath  HENT:  Head: Normocephalic and atraumatic.  Right Ear: External ear normal.  Left Ear: External ear normal.  Eyes: EOM are normal. Pupils are equal, round, and reactive to light.  Neck: Normal range of motion. Neck supple.  Cardiovascular: Normal rate, normal heart sounds and intact distal pulses.   Pulmonary/Chest: She exhibits no tenderness.  Use of accessory muscles, poor air movement with wheezing  Abdominal:  Soft. There is no tenderness. There is no rebound.  Musculoskeletal: She exhibits no tenderness.  Baseline ROM, no obvious new focal weakness.  Neurological:  Mental status and motor strength appears baseline for patient and situation.  Skin: No rash noted.  Psychiatric: She has a normal mood and affect.    ED Course  Procedures (including critical care time) Results for orders placed during the hospital encounter of 05/22/12  CBC      Result Value Range   WBC 13.3 (*) 4.0 - 10.5 K/uL   RBC 4.60  3.87 - 5.11 MIL/uL   Hemoglobin 14.5  12.0 - 15.0 g/dL   HCT 16.1  09.6 - 04.5 %   MCV 92.4  78.0 - 100.0 fL   MCH 31.5  26.0 - 34.0 pg   MCHC 34.1  30.0 - 36.0 g/dL   RDW 40.9  81.1 - 91.4 %   Platelets 283  150 - 400 K/uL  BASIC METABOLIC PANEL      Result Value Range   Sodium 137  135 - 145 mEq/L   Potassium 3.1 (*) 3.5 - 5.1 mEq/L   Chloride 101  96 - 112 mEq/L   CO2 24  19 - 32 mEq/L   Glucose, Bld 133 (*) 70 - 99 mg/dL   BUN 9  6 - 23 mg/dL   Creatinine, Ser 7.82  0.50 - 1.10 mg/dL   Calcium 9.4  8.4 - 95.6 mg/dL   GFR calc non Af Amer >90  >90 mL/min   GFR calc Af Amer >90  >90 mL/min   Dg Chest Portable 1 View  05/22/2012  *RADIOLOGY REPORT*  Clinical Data: 57 year old female with shortness of breath.  PORTABLE CHEST - 1 VIEW  Comparison: 04/18/2012 and earlier.  Findings: Portable AP view at 0445 hours.  Stable large lung volumes.  Stable cardiac size and mediastinal contours.  No pneumothorax, pulmonary edema, pleural effusion or acute pulmonary opacity.  Eventration of the diaphragm again noted.  EKG button artifact.  IMPRESSION: No acute cardiopulmonary abnormality.   Original Report Authenticated By: Erskine Speed, M.D.    Medications  ipratropium (ATROVENT) 0.02 % nebulizer solution (0.5 mg  Given 05/22/12 0432)  ipratropium (ATROVENT) 0.02 % nebulizer solution (0.5 mg  Given 05/22/12 0432)  albuterol (PROVENTIL) (5 MG/ML) 0.5% nebulizer solution (10 mg  Given 05/22/12 0433)   potassium chloride SA (K-DUR,KLOR-CON) CR tablet 60 mEq (60 mEq Oral Given 05/22/12 0554)  HYDROcodone-acetaminophen (  NORCO/VICODIN) 5-325 MG per tablet 1 tablet (1 tablet Oral Given 05/22/12 0556)       0550 Breathing easier. No accessory muscle use. Better air movement. Able to speak in full sentences.  1610 Continues to breathe easier. Ate a snack and had PO fluids. No wheezes. MDM  Patient with COPD here with shortness of breath. Labs are normal except for a low potassium which was given while here. She received a continuous neb which resulted in her being able to breathe and talk. She will be sent home on prednisone. Encouraged to stop smoking. Pt stable in ED with no significant deterioration in condition.The patient appears reasonably screened and/or stabilized for discharge and I doubt any other medical condition or other [facility] requiring further screening, evaluation, or treatment in the ED at this time prior to discharge.  MDM Reviewed: nursing note and vitals Interpretation: labs           Nicoletta Dress. Colon Branch, MD 05/22/12 [phone]

## 2012-06-30 ENCOUNTER — Emergency Department (HOSPITAL_COMMUNITY)
Admission: EM | Admit: 2012-06-30 | Discharge: 2012-07-01 | Disposition: A | Discharge status: Home / Self Care | Payer: Medicare Other | Attending: Emergency Medicine | Admitting: Emergency Medicine

## 2012-06-30 ENCOUNTER — Emergency Department (HOSPITAL_COMMUNITY): Payer: Medicare Other

## 2012-06-30 ENCOUNTER — Encounter (HOSPITAL_COMMUNITY): Payer: Self-pay | Admitting: *Deleted

## 2012-06-30 DIAGNOSIS — Z79899 Other long term (current) drug therapy: Secondary | ICD-10-CM | POA: Insufficient documentation

## 2012-06-30 DIAGNOSIS — J441 Chronic obstructive pulmonary disease with (acute) exacerbation: Secondary | ICD-10-CM

## 2012-06-30 DIAGNOSIS — IMO0002 Reserved for concepts with insufficient information to code with codable children: Secondary | ICD-10-CM | POA: Insufficient documentation

## 2012-06-30 DIAGNOSIS — F17200 Nicotine dependence, unspecified, uncomplicated: Secondary | ICD-10-CM | POA: Insufficient documentation

## 2012-06-30 DIAGNOSIS — R0789 Other chest pain: Secondary | ICD-10-CM | POA: Insufficient documentation

## 2012-06-30 DIAGNOSIS — R000 Tachycardia, unspecified: Secondary | ICD-10-CM | POA: Insufficient documentation

## 2012-06-30 MED ORDER — PREDNISONE 20 MG PO TABS: ORAL_TABLET | ORAL | Status: DC

## 2012-06-30 MED ORDER — ALBUTEROL SULFATE HFA 108 (90 BASE) MCG/ACT IN AERS
2.0000 | INHALATION_SPRAY | Freq: Once | RESPIRATORY_TRACT | Status: AC
  Administered 2012-07-01: 2 via RESPIRATORY_TRACT
  Filled 2012-06-30: qty 6.7

## 2012-06-30 MED ORDER — PREDNISONE 10 MG PO TABS
60.0000 mg | ORAL_TABLET | Freq: Once | ORAL | Status: AC
  Administered 2012-06-30: 60 mg via ORAL
  Filled 2012-06-30: qty 1

## 2012-06-30 MED ORDER — IPRATROPIUM BROMIDE 0.02 % IN SOLN
0.5000 mg | Freq: Once | RESPIRATORY_TRACT | Status: AC
  Administered 2012-06-30: 0.5 mg via RESPIRATORY_TRACT
  Filled 2012-06-30: qty 2.5

## 2012-06-30 MED ORDER — ALBUTEROL SULFATE (5 MG/ML) 0.5% IN NEBU
5.0000 mg | INHALATION_SOLUTION | Freq: Once | RESPIRATORY_TRACT | Status: AC
  Administered 2012-06-30: 5 mg via RESPIRATORY_TRACT
  Filled 2012-06-30: qty 1

## 2012-06-30 MED ORDER — ALBUTEROL SULFATE (5 MG/ML) 0.5% IN NEBU
10.0000 mg | INHALATION_SOLUTION | Freq: Once | RESPIRATORY_TRACT | Status: AC
  Administered 2012-06-30: 10 mg via RESPIRATORY_TRACT
  Filled 2012-06-30: qty 2

## 2012-06-30 NOTE — ED Provider Notes (Signed)
History    This chart was scribed for Misty Horn, MD by Quintella Reichert, ED scribe.  This patient was seen in room APA07/APA07 and the patient's care was started at 9:54 PM.   CSN: [id number]  Arrival date & time 06/30/12  2125     Chief Complaint  Patient presents with  . Shortness of Breath     The history is provided by the patient. No language interpreter was used.    HPI Comments: Misty Richardson is a 57 y.o. female with COPD who continues to smoke who presents to the Emergency Department complaining of gradual-onset, progressively-worsening, moderate SOB with accompanying cough that began today.  She also complains of a mild feeling of chest pressure.  She denies CP, fever, chills, confusion, syncope, nausea, emesis, diarrhea, or any other associated symptoms.   Past Medical History  Diagnosis Date  . COPD (chronic obstructive pulmonary disease)     Past Surgical History  Procedure Laterality Date  . Abdominal hysterectomy    . Appendectomy    . Cesarean section      Family History  Problem Relation Age of Onset  . Heart failure Father     History  Substance Use Topics  . Smoking status: Current Every Day Smoker -- 1.00 packs/day    Types: Cigarettes  . Smokeless tobacco: Not on file  . Alcohol Use: Yes     Comment: occasionally    OB History   Grav Para Term Preterm Abortions TAB SAB Ect Mult Living   2 2        2       Review of Systems 10 Systems reviewed and all are negative for acute change except as noted in the HPI.    Allergies  Avelox  Home Medications   Current Outpatient Rx  Name  Route  Sig  Dispense  Refill  . acetaminophen (TYLENOL) 500 MG tablet   Oral   Take 1,000 mg by mouth daily as needed for pain.         Marland Kitchen albuterol (ACCUNEB) 1.25 MG/3ML nebulizer solution   Nebulization   Take 1 ampule by nebulization every 6 (six) hours as needed. For shortness of breath         . albuterol (PROVENTIL HFA;VENTOLIN HFA) 108  (90 BASE) MCG/ACT inhaler   Inhalation   Inhale 2 puffs into the lungs every 6 (six) hours as needed for wheezing or shortness of breath.         . ALPRAZolam (XANAX) 0.5 MG tablet   Oral   Take 0.5 mg by mouth at bedtime as needed. For anxiety         . benzonatate (TESSALON) 100 MG capsule   Oral   Take 1 capsule (100 mg total) by mouth 3 (three) times daily as needed for cough.   15 capsule   0   . doxycycline (VIBRA-TABS) 100 MG tablet   Oral   Take 1 tablet (100 mg total) by mouth 2 (two) times daily.   14 tablet   0   . predniSONE (DELTASONE) 10 MG tablet   Oral   Take 2 tablets (20 mg total) by mouth daily.   10 tablet   0   . predniSONE (DELTASONE) 20 MG tablet   Oral   Take 2 tablets (40 mg total) by mouth daily.   10 tablet   0   . predniSONE (DELTASONE) 20 MG tablet      2 tabs po daily x  4 days   8 tablet   0   . tiotropium (SPIRIVA) 18 MCG inhalation capsule   Inhalation   Place 18 mcg into inhaler and inhale daily.           BP 137/101  Pulse 111  Temp(Src) 98 F (36.7 C) (Oral)  Resp 26  Ht 5\' 2"  (1.575 m)  Wt 98 lb (44.453 kg)  BMI 17.92 kg/m2  SpO2 95%  Physical Exam  Nursing note and vitals reviewed. Constitutional:  Awake, alert, nontoxic appearance.  HENT:  Head: Atraumatic.  Eyes: Right eye exhibits no discharge. Left eye exhibits no discharge.  Neck: Neck supple.  Cardiovascular: Regular rhythm and normal heart sounds.  Tachycardia present.   No murmur heard. Pulmonary/Chest: She is in respiratory distress (Moderate at rest, with retractions). She has wheezes (Diffuse expiratory wheezes, no crackles). She has no rales. She exhibits no tenderness.  Mild accessory muscle usage  Abdominal: Soft. There is no tenderness. There is no rebound.  Musculoskeletal: She exhibits no edema and no tenderness.  Baseline ROM, no obvious new focal weakness.  Neurological:  Mental status and motor strength appears baseline for patient and  situation.  Skin: No rash noted.  Psychiatric: She has a normal mood and affect.    ED Course  Procedures (including critical care time) ECG: Sinus tachycardia, ventricular rate 110, normal axis, artifact, nonspecific ST&T abnormality, no significant change noted compared with October 2013 DIAGNOSTIC STUDIES: Oxygen Saturation is 95% on room air, adequate by my interpretation.    COORDINATION OF CARE: 9:57 PM-Discussed treatment plan which includes breathing treatment and CXR with pt at bedside and pt agreed to plan.   Patient understands and agrees with initial ED impression and plan with expectations set for ED visit.  Feels much better, lungs show minimal end expiratory wheezes, no retractions no accessory muscle usage, she speaks full sentences with normal pulse oximetry on room air 97%, patient will stay in town with her mother tonight just in case she (Pt) gets worse. 2350  Labs Reviewed - No data to display Dg Chest 2 View  06/30/2012   *RADIOLOGY REPORT*  Clinical Data: Cough and shortness of breath for the past week. Smoker.  COPD.  CHEST - 2 VIEW  Comparison: 05/22/2012.  Findings: Normal sized heart.  Clear lungs.  The lungs are hyperexpanded.  Old left tenth rib fracture.  IMPRESSION: COPD.  No acute abnormality.   Original Report Authenticated By: Beckie Salts, M.D.     1. COPD exacerbation       MDM  I personally performed the services described in this documentation, which was scribed in my presence. The recorded information has been reviewed and is accurate. Patient / Family / Caregiver informed of clinical course, understand medical decision-making process, and agree with plan. I doubt any other EMC precluding discharge at this time including, but not necessarily limited to the following:sepsis.   Misty Horn, MD 07/01/12 1320

## 2012-06-30 NOTE — ED Notes (Signed)
Pt states improvement in SOB at this time. Exp wheezing noted but is improved. Pt no longer visibly dyspneic

## 2012-06-30 NOTE — ED Notes (Signed)
Pt back from radiology, placed back on monitor and nebulizer restarted, NAD noted.

## 2012-06-30 NOTE — ED Notes (Signed)
Pt on bedside commode at this time, NAD noted, getting breathing treatment currently.

## 2012-06-30 NOTE — ED Notes (Signed)
Nebulizer stopped to pt can go to radiology, will restart when she returns

## 2012-06-30 NOTE — ED Notes (Signed)
Pt has been sob today and she gave herself a breathing treatment with mild relief but has gotten worse in the last hour.

## 2012-06-30 NOTE — Progress Notes (Signed)
Pt started on CAT nebulizer after initial neb treatment.  She as before will not wear mask but will do CAT by mouth piece. Pt still smokes.

## 2012-06-30 NOTE — ED Notes (Signed)
Pt states she still feels SOB after breathing treatment complete. Dr bednar notified. Pt does not appear in resp distress. Mild exp wheezing noted. RR 17. o2 sats 100% on 2L 02 [place]

## 2012-06-30 NOTE — ED Notes (Signed)
Pt states she still feels short of breath and states she feels like she needs another treatment, Dr bednar notified, orders obtained and treatment started

## 2012-06-30 NOTE — ED Notes (Signed)
Dr Fonnie Jarvis at bedside, IV placed, nebulizer is going and Resp therapy in room. Pt on monitor.

## 2012-08-28 ENCOUNTER — Encounter (HOSPITAL_COMMUNITY): Payer: Medicare Other | Attending: Internal Medicine

## 2012-08-28 ENCOUNTER — Other Ambulatory Visit (HOSPITAL_COMMUNITY): Payer: Self-pay | Admitting: Oncology

## 2012-08-28 DIAGNOSIS — E876 Hypokalemia: Secondary | ICD-10-CM

## 2012-08-28 DIAGNOSIS — J4489 Other specified chronic obstructive pulmonary disease: Secondary | ICD-10-CM | POA: Insufficient documentation

## 2012-08-28 DIAGNOSIS — J449 Chronic obstructive pulmonary disease, unspecified: Secondary | ICD-10-CM | POA: Insufficient documentation

## 2012-08-28 DIAGNOSIS — D472 Monoclonal gammopathy: Secondary | ICD-10-CM

## 2012-08-28 LAB — CBC WITH DIFFERENTIAL/PLATELET
Basophils Absolute: 0.1 10*3/uL (ref 0.0–0.1)
Basophils Relative: 2 % — ABNORMAL HIGH (ref 0–1)
Eosinophils Absolute: 0.8 10*3/uL — ABNORMAL HIGH (ref 0.0–0.7)
MCH: 31.5 pg (ref 26.0–34.0)
MCHC: 34.3 g/dL (ref 30.0–36.0)
Neutro Abs: 2.3 10*3/uL (ref 1.7–7.7)
Neutrophils Relative %: 39 % — ABNORMAL LOW (ref 43–77)
RDW: 13.3 % (ref 11.5–15.5)

## 2012-08-28 LAB — COMPREHENSIVE METABOLIC PANEL
ALT: 13 U/L (ref 0–35)
AST: 21 U/L (ref 0–37)
Alkaline Phosphatase: 77 U/L (ref 39–117)
CO2: 25 mEq/L (ref 19–32)
Chloride: 104 mEq/L (ref 96–112)
GFR calc Af Amer: >90 mL/min (ref >90)
GFR calc non Af Amer: >90 mL/min (ref >90)
Glucose, Bld: 122 mg/dL — ABNORMAL HIGH (ref 70–99)
Potassium: 3.2 mEq/L — ABNORMAL LOW (ref 3.5–5.1)
Sodium: 139 mEq/L (ref 135–145)
Total Bilirubin: 0.7 mg/dL (ref 0.3–1.2)

## 2012-08-28 MED ORDER — POTASSIUM CHLORIDE CRYS ER 20 MEQ PO TBCR: 20.0000 meq | EXTENDED_RELEASE_TABLET | Freq: Two times a day (BID) | ORAL | Status: DC

## 2012-08-28 NOTE — Progress Notes (Signed)
Labs drawn today for b61mic,cbc/diff,cmp,kllc,mm

## 2012-08-30 LAB — MULTIPLE MYELOMA PANEL, SERUM
Alpha-1-Globulin: 6.8 % — ABNORMAL HIGH (ref 2.9–4.9)
Gamma Globulin: 19.5 % — ABNORMAL HIGH (ref 11.1–18.8)
IgG (Immunoglobin G), Serum: 1770 mg/dL — ABNORMAL HIGH (ref 690–1700)
M-Spike, %: 1.12 g/dL

## 2012-09-05 ENCOUNTER — Encounter (HOSPITAL_BASED_OUTPATIENT_CLINIC_OR_DEPARTMENT_OTHER): Payer: Medicare Other | Admitting: Oncology

## 2012-09-05 ENCOUNTER — Encounter (HOSPITAL_COMMUNITY): Payer: Self-pay | Admitting: Oncology

## 2012-09-05 VITALS — BP 101/64 | HR 84 | Temp 98.4°F | Resp 18 | Wt 99.4 lb

## 2012-09-05 DIAGNOSIS — J449 Chronic obstructive pulmonary disease, unspecified: Secondary | ICD-10-CM | POA: Insufficient documentation

## 2012-09-05 DIAGNOSIS — D472 Monoclonal gammopathy: Secondary | ICD-10-CM

## 2012-09-05 DIAGNOSIS — E876 Hypokalemia: Secondary | ICD-10-CM

## 2012-09-05 HISTORY — DX: Monoclonal gammopathy: D47.2

## 2012-09-05 HISTORY — DX: Chronic obstructive pulmonary disease, unspecified: J44.9

## 2012-09-05 NOTE — Progress Notes (Signed)
Misty Richardson., MD [address] [place] [address]  MGUS (monoclonal gammopathy of unknown significance)  COPD (chronic obstructive pulmonary disease)  CURRENT THERAPY: Observation  INTERVAL HISTORY: Misty Richardson 57 y.o. female returns for  regular  visit for followup of MGUS with 3% to 7% plasma cells  Will continue to follow the CRAB criteria for MM transformation.  C= Calcium increase of greater than 11.5 mg/dL R = Renal insufficiency with creatinine > 2 mg/dL A= Anemia with Hgb < 10 g/dL B= Bone disease with lytic lesions and/or osteopenia  Her labs do not meet any of these criteria and her bone scan 6 months ago was negative.    I personally reviewed and went over laboratory results with the patient.  Her M-Spike is the same as it was 6 months ago at 1.12.  Her IgG level did increase some to 1770, compared to 1550 six months ago.  Her kapa free light chains have increased to 17.7 compared to 11.2 and kappa lambda light chain ratio increased to 21.85 compared to 13.83.  Her renal function is stable, and her CBC is WNL.  With this information, she is very stable and there is no need for intervention at this time.  We will continue to follow her with labs every 6 months and office visit.   She reports that she has difficulty swallowing her Kdur.  i have recommended that she cut the tablet in 1/2 and take it that way.  She asked for some education regarding hypokalemia and this was provided today.   She reports a history of right low back muscle strain that was evaluated and treated by PCP.   She requests information about MGUS and I tried to print information from up-to-date but it would not print.  Therefore I went to the [facility] website and printed their definition of MGUS and provided it to the patient.    Past Medical History  Diagnosis Date  . COPD (chronic obstructive pulmonary disease)   . MGUS (monoclonal gammopathy of unknown  significance) 09/05/2012  . COPD (chronic obstructive pulmonary disease) 09/05/2012    has Low blood potassium; MGUS (monoclonal gammopathy of unknown significance); and COPD (chronic obstructive pulmonary disease) on her problem list.     is allergic to avelox.  [name] had no medications administered during this visit.  Past Surgical History  Procedure Laterality Date  . Abdominal hysterectomy    . Appendectomy    . Cesarean section      Denies any headaches, dizziness, double vision, fevers, chills, night sweats, nausea, vomiting, diarrhea, constipation, chest pain, heart palpitations, shortness of breath, blood in stool, black tarry stool, urinary pain, urinary burning, urinary frequency, hematuria.   PHYSICAL EXAMINATION  ECOG PERFORMANCE STATUS: 0 - Asymptomatic  Filed Vitals:   09/05/12 1142  BP: 101/64  Pulse: 84  Temp: 98.4 F (36.9 C)  Resp: 18    GENERAL:alert, no distress, well nourished, well developed, comfortable, cooperative and smiling SKIN: skin color, texture, turgor are normal, no rashes or significant lesions HEAD: Normocephalic, No masses, lesions, tenderness or abnormalities EYES: normal, PERRLA, EOMI, Conjunctiva are pink and non-injected EARS: External ears normal OROPHARYNX:mucous membranes are moist  NECK: supple, no adenopathy, thyroid normal size, non-tender, without nodularity, no stridor, non-tender, trachea midline LYMPH:  no palpable lymphadenopathy, no hepatosplenomegaly BREAST:not examined LUNGS: clear to auscultation and percussion HEART: regular rate & rhythm, no murmurs, no gallops, S1 normal and S2 normal ABDOMEN:abdomen  soft, non-tender, normal bowel sounds, no masses or organomegaly and no hepatosplenomegaly BACK: Back symmetric, no curvature., No CVA tenderness EXTREMITIES:less then 2 second capillary refill, no joint deformities, effusion, or inflammation, no edema, no skin discoloration, no clubbing, no cyanosis  NEURO:  alert & oriented x 3 with fluent speech, no focal motor/sensory deficits, gait normal    LABORATORY DATA: CBC    Component Value Date/Time   WBC 5.9 08/28/2012 1117   RBC 4.32 08/28/2012 1117   HGB 13.6 08/28/2012 1117   HCT 39.7 08/28/2012 1117   PLT 292 08/28/2012 1117   MCV 91.9 08/28/2012 1117   MCH 31.5 08/28/2012 1117   MCHC 34.3 08/28/2012 1117   RDW 13.3 08/28/2012 1117   LYMPHSABS 2.2 08/28/2012 1117   MONOABS 0.4 08/28/2012 1117   EOSABS 0.8* 08/28/2012 1117   BASOSABS 0.1 08/28/2012 1117      Chemistry      Component Value Date/Time   NA 139 08/28/2012 1117   K 3.2* 08/28/2012 1117   CL 104 08/28/2012 1117   CO2 25 08/28/2012 1117   BUN 12 08/28/2012 1117   CREATININE 0.79 08/28/2012 1117   CREATININE 0.61 08/29/2008 1556      Component Value Date/Time   CALCIUM 9.3 08/28/2012 1117   ALKPHOS 77 08/28/2012 1117   AST 21 08/28/2012 1117   ALT 13 08/28/2012 1117   BILITOT 0.7 08/28/2012 1117      Results for Misty Richardson (MRN [id number]) as of 09/05/2012 11:27  Ref. Range 08/28/2012 11:17 08/28/2012 11:17  Beta-2 Microglobulin Latest Range: 1.01-1.73 mg/L 2.04 (H)   Albumin ELP Latest Range: 55.8-66.1 %  56.1  Alpha-1-Globulin Latest Range: 2.9-4.9 %  6.8 (H)  Alpha-2-Globulin Latest Range: 7.1-11.8 %  9.7  Beta Globulin Latest Range: 4.7-7.2 %  5.1  Beta 2 Latest Range: 3.2-6.5 %  2.8 (L)  Gamma Globulin Latest Range: 11.1-18.8 %  19.5 (H)  M-SPIKE, % No range found  1.12  SPE Interp. No range found  (NOTE)  Comment No range found  (NOTE)  IgG (Immunoglobin G), Serum Latest Range: 6131177658 mg/dL  7829 (H)  IgA Latest Range: 69-380 mg/dL  39 (L)  IgM, Serum Latest Range: 52-322 mg/dL  46 (L)  Kappa free light chain Latest Range: 0.33-1.94 mg/dL 56.21 (H)   Lamda free light chains Latest Range: 0.57-2.63 mg/dL 3.08   Kappa, lamda light chain ratio Latest Range: 0.26-1.65  21.85 (H)       RADIOGRAPHIC STUDIES:  01/31/2012  *RADIOLOGY REPORT*  Clinical Data: Evaluate  lytic lesions. Possible multiple myeloma.  METASTATIC BONE SURVEY  Comparison: CT scan 07/25/2011.  Findings: No definite lytic myelomatous bone lesions are identified  in the axial or appendicular skeleton. No significant degenerative  changes.  IMPRESSION:  Negative metastatic bone survey for lytic myelomatous bone lesions.  Original Report Authenticated By: Rudie Meyer, M.D.    ASSESSMENT:  1. MGUS with 3% to 7% plasma cells, very stable 2. COPD, quit smoking in July 2014  Patient Active Problem List   Diagnosis Date Noted  . MGUS (monoclonal gammopathy of unknown significance) 09/05/2012  . COPD (chronic obstructive pulmonary disease) 09/05/2012  . Low blood potassium 01/26/2011     PLAN:  1. I personally reviewed and went over laboratory results with the patient. 2. I personally reviewed and went over radiographic studies with the patient. 3. Labs in 6 months: CBC diff, CMET, MM panel, B2 MG. 4. Patient education regarding hypokalemia 5. Patient education regarding MGUS  6. Definition of MGUS printed and provided to the patient from [facility] website.  7. Return in 6 months for follow-up.   THERAPY PLAN:  Her MGUS is very stable.  Will continue to follow the CRAB criteria for evaluation of MM transformation.    All questions were answered. The patient knows to call the clinic with any problems, questions or concerns. We can certainly see the patient much sooner if necessary.  Patient and plan discussed with Dr. Erline Hau and he is in agreement with the aforementioned.   Rain Friedt

## 2012-09-05 NOTE — Patient Instructions (Addendum)
.  [facility] Discharge Instructions  RECOMMENDATIONS MADE BY THE CONSULTANT AND ANY TEST RESULTS WILL BE SENT TO YOUR REFERRING PHYSICIAN.  EXAM FINDINGS BY THE PHYSICIAN TODAY AND SIGNS OR SYMPTOMS TO REPORT TO CLINIC OR PRIMARY PHYSICIAN: exam today per T. Kefalas PA   INSTRUCTIONS GIVEN AND DISCUSSED: Labs to be done in 6 months then to see MD   Thank you for choosing [facility] to provide your oncology and hematology care.  To afford each patient quality time with our providers, please arrive at least 15 minutes before your scheduled appointment time.  With your help, our goal is to use those 15 minutes to complete the necessary work-up to ensure our physicians have the information they need to help with your evaluation and healthcare recommendations.    Effective January 1st, 2014, we ask that you re-schedule your appointment with our physicians should you arrive 10 or more minutes late for your appointment.  We strive to give you quality time with our providers, and arriving late affects you and other patients whose appointments are after yours.    Again, thank you for choosing [facility].  Our hope is that these requests will decrease the amount of time that you wait before being seen by our physicians.       _____________________________________________________________  Should you have questions after your visit to [facility], please contact our office at [phone] between the hours of 8:30 a.m. and 5:00 p.m.  Voicemails left after 4:30 p.m. will not be returned until the following business day.  For prescription refill requests, have your pharmacy contact our office with your prescription refill request.

## 2012-09-19 ENCOUNTER — Inpatient Hospital Stay (HOSPITAL_COMMUNITY): Payer: Medicare Other

## 2012-09-19 ENCOUNTER — Inpatient Hospital Stay (HOSPITAL_COMMUNITY)
Admission: EM | Admit: 2012-09-19 | Discharge: 2012-09-23 | DRG: 208 | Disposition: A | Discharge status: Home Health | Payer: Medicare Other | Attending: Internal Medicine | Admitting: Internal Medicine

## 2012-09-19 ENCOUNTER — Other Ambulatory Visit: Payer: Self-pay

## 2012-09-19 ENCOUNTER — Emergency Department (HOSPITAL_COMMUNITY): Payer: Medicare Other

## 2012-09-19 ENCOUNTER — Encounter (HOSPITAL_COMMUNITY): Payer: Self-pay

## 2012-09-19 DIAGNOSIS — E876 Hypokalemia: Secondary | ICD-10-CM | POA: Diagnosis present

## 2012-09-19 DIAGNOSIS — J441 Chronic obstructive pulmonary disease with (acute) exacerbation: Secondary | ICD-10-CM

## 2012-09-19 DIAGNOSIS — E872 Acidosis, unspecified: Secondary | ICD-10-CM

## 2012-09-19 DIAGNOSIS — E8729 Other acidosis: Secondary | ICD-10-CM | POA: Diagnosis present

## 2012-09-19 DIAGNOSIS — F411 Generalized anxiety disorder: Secondary | ICD-10-CM | POA: Diagnosis present

## 2012-09-19 DIAGNOSIS — J449 Chronic obstructive pulmonary disease, unspecified: Secondary | ICD-10-CM | POA: Diagnosis present

## 2012-09-19 DIAGNOSIS — R51 Headache: Secondary | ICD-10-CM | POA: Diagnosis present

## 2012-09-19 DIAGNOSIS — D72829 Elevated white blood cell count, unspecified: Secondary | ICD-10-CM | POA: Diagnosis present

## 2012-09-19 DIAGNOSIS — J9600 Acute respiratory failure, unspecified whether with hypoxia or hypercapnia: Principal | ICD-10-CM

## 2012-09-19 DIAGNOSIS — F17200 Nicotine dependence, unspecified, uncomplicated: Secondary | ICD-10-CM | POA: Diagnosis present

## 2012-09-19 DIAGNOSIS — J9602 Acute respiratory failure with hypercapnia: Secondary | ICD-10-CM | POA: Diagnosis present

## 2012-09-19 DIAGNOSIS — G9340 Encephalopathy, unspecified: Secondary | ICD-10-CM | POA: Diagnosis present

## 2012-09-19 DIAGNOSIS — D472 Monoclonal gammopathy: Secondary | ICD-10-CM | POA: Diagnosis present

## 2012-09-19 DIAGNOSIS — Z87891 Personal history of nicotine dependence: Secondary | ICD-10-CM

## 2012-09-19 LAB — BLOOD GAS, ARTERIAL
Acid-base deficit: 6.6 mmol/L — ABNORMAL HIGH (ref 0.0–2.0)
Acid-base deficit: 12 mmol/L — ABNORMAL HIGH (ref 0.0–2.0)
Bicarbonate: 15.4 mEq/L — ABNORMAL LOW (ref 20.0–24.0)
Drawn by: 38235
FIO2: 100 %
FIO2: 0.4 %
MECHVT: 350 mL
MECHVT: 350 mL
MECHVT: 550 mL
O2 Saturation: 99.2 %
O2 Saturation: 97.5 %
PEEP: 5 cmH2O
Patient temperature: 37
Patient temperature: 37
RATE: 12 resp/min
RATE: 12 resp/min
RATE: 18 resp/min
TCO2: 18 mmol/L (ref 0–100)
pCO2 arterial: 42.2 mmHg (ref 35.0–45.0)
pH, Arterial: 7.093 — CL (ref 7.350–7.450)
pH, Arterial: 7.174 — CL (ref 7.350–7.450)
pO2, Arterial: 113 mmHg — ABNORMAL HIGH (ref 80.0–100.0)

## 2012-09-19 LAB — CBC WITH DIFFERENTIAL/PLATELET
Basophils Absolute: 0.1 10*3/uL (ref 0.0–0.1)
Eosinophils Absolute: 1 10*3/uL — ABNORMAL HIGH (ref 0.0–0.7)
Eosinophils Relative: 8 % — ABNORMAL HIGH (ref 0–5)
Lymphs Abs: 4.7 10*3/uL — ABNORMAL HIGH (ref 0.7–4.0)
MCH: 31.8 pg (ref 26.0–34.0)
MCV: 94.9 fL (ref 78.0–100.0)
Neutrophils Relative %: 50 % (ref 43–77)
Platelets: ADEQUATE 10*3/uL (ref 150–400)
RBC: 4.15 MIL/uL (ref 3.87–5.11)
RDW: 13.5 % (ref 11.5–15.5)
WBC: 12.2 10*3/uL — ABNORMAL HIGH (ref 4.0–10.5)

## 2012-09-19 LAB — URINE MICROSCOPIC-ADD ON

## 2012-09-19 LAB — COMPREHENSIVE METABOLIC PANEL
ALT: 16 U/L (ref 0–35)
AST: 25 U/L (ref 0–37)
Albumin: 3.9 g/dL (ref 3.5–5.2)
Alkaline Phosphatase: 90 U/L (ref 39–117)
Calcium: 8.1 mg/dL — ABNORMAL LOW (ref 8.4–10.5)
Glucose, Bld: 217 mg/dL — ABNORMAL HIGH (ref 70–99)
Potassium: 4.2 mEq/L (ref 3.5–5.1)
Sodium: 138 mEq/L (ref 135–145)
Total Protein: 7.3 g/dL (ref 6.0–8.3)

## 2012-09-19 LAB — RAPID URINE DRUG SCREEN, HOSP PERFORMED
Barbiturates: NOT DETECTED
Tetrahydrocannabinol: NOT DETECTED

## 2012-09-19 LAB — URINALYSIS, ROUTINE W REFLEX MICROSCOPIC
Glucose, UA: NEGATIVE mg/dL
Specific Gravity, Urine: 1.02 (ref 1.005–1.030)

## 2012-09-19 LAB — GLUCOSE, CAPILLARY

## 2012-09-19 MED ORDER — SODIUM CHLORIDE 0.9 % IV SOLN: 1000.0000 mL | Freq: Once | INTRAVENOUS | Status: AC

## 2012-09-19 MED ORDER — METHYLPREDNISOLONE SODIUM SUCC 125 MG IJ SOLR
125.0000 mg | Freq: Four times a day (QID) | INTRAMUSCULAR | Status: DC
  Administered 2012-09-19 – 2012-09-22 (×11): 125 mg via INTRAVENOUS
  Filled 2012-09-19 (×11): qty 2

## 2012-09-19 MED ORDER — PROPOFOL 10 MG/ML IV EMUL
5.0000 ug/kg/min | INTRAVENOUS | Status: DC
Start: 2012-09-19 — End: 2012-09-21
  Administered 2012-09-19 (×2): 20.045 ug/kg/min via INTRAVENOUS
  Administered 2012-09-19: 50 ug/kg/min via INTRAVENOUS
  Administered 2012-09-20: 30 ug/kg/min via INTRAVENOUS
  Administered 2012-09-20 – 2012-09-21 (×2): 25 ug/kg/min via INTRAVENOUS
  Filled 2012-09-19 (×4): qty 100

## 2012-09-19 MED ORDER — CEFTRIAXONE SODIUM 1 G IJ SOLR
1.0000 g | INTRAMUSCULAR | Status: DC
  Administered 2012-09-19: 1 g via INTRAVENOUS
  Filled 2012-09-19: qty 10

## 2012-09-19 MED ORDER — ROCURONIUM BROMIDE 50 MG/5ML IV SOLN
INTRAVENOUS | Status: AC
  Filled 2012-09-19: qty 2

## 2012-09-19 MED ORDER — PANTOPRAZOLE SODIUM 40 MG IV SOLR
40.0000 mg | Freq: Two times a day (BID) | INTRAVENOUS | Status: DC
  Administered 2012-09-19 – 2012-09-22 (×8): 40 mg via INTRAVENOUS
  Filled 2012-09-19 (×8): qty 40

## 2012-09-19 MED ORDER — SODIUM CHLORIDE 0.9 % IV SOLN
1000.0000 mL | INTRAVENOUS | Status: DC
  Administered 2012-09-19 – 2012-09-22 (×5): 1000 mL via INTRAVENOUS

## 2012-09-19 MED ORDER — PROPOFOL 10 MG/ML IV EMUL
INTRAVENOUS | Status: AC
  Filled 2012-09-19: qty 100

## 2012-09-19 MED ORDER — SODIUM CHLORIDE 0.9 % IV SOLN: 250.0000 mL | INTRAVENOUS | Status: DC | PRN

## 2012-09-19 MED ORDER — HEPARIN SODIUM (PORCINE) 5000 UNIT/ML IJ SOLN
5000.0000 [IU] | Freq: Three times a day (TID) | INTRAMUSCULAR | Status: DC
  Administered 2012-09-19 – 2012-09-23 (×12): 5000 [IU] via SUBCUTANEOUS
  Filled 2012-09-19 (×12): qty 1

## 2012-09-19 MED ORDER — LEVOFLOXACIN IN D5W 500 MG/100ML IV SOLN: 500.0000 mg | INTRAVENOUS | Status: DC

## 2012-09-19 MED ORDER — FENTANYL CITRATE 0.05 MG/ML IJ SOLN
25.0000 ug/h | INTRAMUSCULAR | Status: DC
  Administered 2012-09-19: 40 ug/h via INTRAVENOUS
  Administered 2012-09-19: 400 ug/h via INTRAVENOUS
  Administered 2012-09-19: 100 ug/h via INTRAVENOUS
  Administered 2012-09-20: 150 ug/h via INTRAVENOUS
  Filled 2012-09-19 (×5): qty 50

## 2012-09-19 MED ORDER — FENTANYL CITRATE 0.05 MG/ML IJ SOLN: 50.0000 ug | INTRAMUSCULAR | Status: DC | PRN

## 2012-09-19 MED ORDER — METHYLPREDNISOLONE SODIUM SUCC 125 MG IJ SOLR
125.0000 mg | Freq: Once | INTRAMUSCULAR | Status: AC
  Administered 2012-09-19: 125 mg via INTRAVENOUS
  Filled 2012-09-19: qty 2

## 2012-09-19 MED ORDER — DEXTROSE 5 % IV SOLN
INTRAVENOUS | Status: AC
  Filled 2012-09-19: qty 500

## 2012-09-19 MED ORDER — ROCURONIUM BROMIDE 100 MG/10ML IV SOLN
10.0000 mg | Freq: Once | INTRAVENOUS | Status: AC
  Administered 2012-09-19: 10 mg via INTRAVENOUS
  Filled 2012-09-19: qty 1

## 2012-09-19 MED ORDER — DEXTROSE 5 % IV SOLN
INTRAVENOUS | Status: AC
  Filled 2012-09-19: qty 10

## 2012-09-19 MED ORDER — ALBUTEROL (5 MG/ML) CONTINUOUS INHALATION SOLN
INHALATION_SOLUTION | RESPIRATORY_TRACT | Status: AC
  Administered 2012-09-19: 11:00:00
  Filled 2012-09-19: qty 20

## 2012-09-19 MED ORDER — LIDOCAINE HCL (CARDIAC) 20 MG/ML IV SOLN
INTRAVENOUS | Status: AC
  Filled 2012-09-19: qty 5

## 2012-09-19 MED ORDER — CHLORHEXIDINE GLUCONATE 0.12 % MT SOLN
15.0000 mL | Freq: Four times a day (QID) | OROMUCOSAL | Status: DC
  Administered 2012-09-19 – 2012-09-21 (×7): 15 mL via OROMUCOSAL
  Filled 2012-09-19 (×7): qty 15

## 2012-09-19 MED ORDER — IPRATROPIUM BROMIDE HFA 17 MCG/ACT IN AERS: 2.0000 | INHALATION_SPRAY | RESPIRATORY_TRACT | Status: DC

## 2012-09-19 MED ORDER — SODIUM CHLORIDE 0.9 % IV BOLUS (SEPSIS)
500.0000 mL | Freq: Once | INTRAVENOUS | Status: AC
  Administered 2012-09-19: 500 mL via INTRAVENOUS

## 2012-09-19 MED ORDER — IPRATROPIUM BROMIDE 0.02 % IN SOLN
0.5000 mg | RESPIRATORY_TRACT | Status: DC
  Administered 2012-09-19 – 2012-09-23 (×21): 0.5 mg via RESPIRATORY_TRACT
  Filled 2012-09-19 (×20): qty 2.5

## 2012-09-19 MED ORDER — SODIUM CHLORIDE 0.9 % IV SOLN
1000.0000 mL | Freq: Once | INTRAVENOUS | Status: AC
  Administered 2012-09-19: 1000 mL via INTRAVENOUS

## 2012-09-19 MED ORDER — BIOTENE DRY MOUTH MT LIQD
15.0000 mL | Freq: Four times a day (QID) | OROMUCOSAL | Status: DC
  Administered 2012-09-19 – 2012-09-22 (×10): 15 mL via OROMUCOSAL

## 2012-09-19 MED ORDER — PROPOFOL 10 MG/ML IV EMUL
5.0000 ug/kg/min | INTRAVENOUS | Status: DC
  Administered 2012-09-19: 10 ug/kg/min via INTRAVENOUS

## 2012-09-19 MED ORDER — SUCCINYLCHOLINE CHLORIDE 20 MG/ML IJ SOLN
INTRAMUSCULAR | Status: AC
  Filled 2012-09-19: qty 1

## 2012-09-19 MED ORDER — FENTANYL BOLUS VIA INFUSION
25.0000 ug | Freq: Four times a day (QID) | INTRAVENOUS | Status: DC | PRN
  Filled 2012-09-19: qty 100

## 2012-09-19 MED ORDER — ALBUTEROL SULFATE (5 MG/ML) 0.5% IN NEBU
2.5000 mg | INHALATION_SOLUTION | RESPIRATORY_TRACT | Status: DC
  Administered 2012-09-19 – 2012-09-23 (×21): 2.5 mg via RESPIRATORY_TRACT
  Filled 2012-09-19 (×21): qty 0.5

## 2012-09-19 MED ORDER — DEXTROSE 5 % IV SOLN
500.0000 mg | INTRAVENOUS | Status: DC
  Administered 2012-09-19: 500 mg via INTRAVENOUS
  Filled 2012-09-19: qty 500

## 2012-09-19 MED ORDER — SODIUM CHLORIDE 0.9 % IV SOLN
1000.0000 mL | INTRAVENOUS | Status: DC
  Administered 2012-09-19 – 2012-09-21 (×3): 1000 mL via INTRAVENOUS

## 2012-09-19 MED ORDER — ETOMIDATE 2 MG/ML IV SOLN
INTRAVENOUS | Status: AC
  Filled 2012-09-19: qty 20

## 2012-09-19 NOTE — Progress Notes (Signed)
Name: [name] MRN: [id number] DOB: 03-Sep-1955  ELECTRONIC ICU PHYSICIAN NOTE  Problem:  Mixed metabolic, resp acidosis Pt air trapping on VT 450 and soft bp  Intervention:  Fluid bolus, adjust vent to help compensate for met acidosis but avoid air trapping by using a higher vt and a lower rate   [name] 09/19/2012, 7:50 PM

## 2012-09-19 NOTE — Progress Notes (Signed)
Name: WHITTLEY CARANDANG MRN: [id number] DOB: 07-17-1955  ELECTRONIC ICU PHYSICIAN NOTE  Problem:  GI prophylaxis   Intervention:  Protonix 40 mg IV daily   [name] 09/19/2012, 3:14 PM

## 2012-09-19 NOTE — ED Notes (Signed)
ems arrived to pt's home, tripod position. H/o copd.  Was given multiple neb treatments en route by ems. sats at arrival 100% to ed.

## 2012-09-19 NOTE — Progress Notes (Signed)
RN reported patient's (21:02) ABG results. Results given to Dr. Sherene Sires at [facility].

## 2012-09-19 NOTE — ED Provider Notes (Signed)
CSN: [id number]     Arrival date & time 09/19/12  1032 History  This chart was scribed for Misty Kras, MD, by Yevette Edwards, ED Scribe. This patient was seen in room APA01/APA01 and the patient's care was started at 10:35 AM. First MD Initiated Contact with Patient 09/19/12 1033     Chief Complaint  Patient presents with  . Respiratory Distress   LEVEL 5 CAVEAT (Severe Respiratory Distress)  The history is provided by the EMS personnel. The history is limited by the condition of the patient. No language interpreter was used.   HPI Comments: Misty Richardson is a 57 y.o. female, with a h/o COPD, brought in by EMS who presents to the Emergency Department complaining of severe respiratory distress. The pt is unable to follow commands or respond to questions.  Past Medical History  Diagnosis Date  . COPD (chronic obstructive pulmonary disease)   . MGUS (monoclonal gammopathy of unknown significance) 09/05/2012  . COPD (chronic obstructive pulmonary disease) 09/05/2012   Past Surgical History  Procedure Laterality Date  . Abdominal hysterectomy    . Appendectomy    . Cesarean section     Family History  Problem Relation Age of Onset  . Heart failure Father    History  Substance Use Topics  . Smoking status: Former Smoker -- 1.00 packs/day    Types: Cigarettes  . Smokeless tobacco: Former Neurosurgeon    Quit date: 07/31/2012  . Alcohol Use: Yes     Comment: occasionally   OB History   Grav Para Term Preterm Abortions TAB SAB Ect Mult Living   2 2        2      Review of Systems  Unable to perform ROS: Severe respiratory distress    Allergies  Avelox  Home Medications   Current Outpatient Rx  Name  Route  Sig  Dispense  Refill  . albuterol (ACCUNEB) 1.25 MG/3ML nebulizer solution   Nebulization   Take 1 ampule by nebulization every 6 (six) hours as needed. For shortness of breath         . albuterol (PROVENTIL HFA;VENTOLIN HFA) 108 (90 BASE) MCG/ACT inhaler   Inhalation    Inhale 2 puffs into the lungs every 6 (six) hours as needed for wheezing or shortness of breath.         . ALPRAZolam (XANAX) 0.5 MG tablet   Oral   Take 0.5 mg by mouth at bedtime. For anxiety         . buPROPion (WELLBUTRIN SR) 100 MG 12 hr tablet   Oral   Take 100 mg by mouth 3 (three) times daily.          . cyclobenzaprine (FLEXERIL) 10 MG tablet   Oral   Take 10 mg by mouth 3 (three) times daily as needed for muscle spasms.          Marland Kitchen HYDROcodone-acetaminophen (NORCO/VICODIN) 5-325 MG per tablet   Oral   Take 1 tablet by mouth every 6 (six) hours as needed for pain.         . potassium chloride SA (K-DUR,KLOR-CON) 20 MEQ tablet   Oral   Take 1 tablet (20 mEq total) by mouth 2 (two) times daily.   30 tablet   0   . acetaminophen (TYLENOL) 500 MG tablet   Oral   Take 1,000 mg by mouth daily as needed for pain.          Triage Vitals:  SpO2 74%  Physical Exam  Nursing note and vitals reviewed. Constitutional: She appears ill. She appears distressed.  Thin, underweight  HENT:  Head: Normocephalic and atraumatic.  Right Ear: External ear normal.  Left Ear: External ear normal.  Mouth/Throat: No oropharyngeal exudate.  Eyes: Conjunctivae are normal. Right eye exhibits no discharge. Left eye exhibits no discharge. No scleral icterus.  Neck: Neck supple. No JVD present. No tracheal deviation present.  Cardiovascular: Regular rhythm and intact distal pulses.   Tachycardic  Pulmonary/Chest: Accessory muscle usage present. No stridor. Tachypnea noted. She is in respiratory distress. She has decreased breath sounds. She has wheezes. She has no rales.  Abdominal: Soft. Bowel sounds are normal. She exhibits no distension. There is no tenderness. There is no rebound and no guarding.  Musculoskeletal: She exhibits no edema and no tenderness.  Lymphadenopathy:    She has no cervical adenopathy.  Neurological: She is alert. No sensory deficit. Cranial nerve deficit:   no gross defecits noted. She exhibits normal muscle tone. She displays no seizure activity. GCS eye subscore is 4. GCS verbal subscore is 3. GCS motor subscore is 5.  Will not follow commands. Will not say her name. Moaning occasionally yelling out, "No."  Skin: Skin is warm. No rash noted. She is diaphoretic.  Psychiatric: She has a normal mood and affect.    ED Course  INTUBATION Date/Time: 09/19/2012 12:33 PM Performed by: Linwood Dibbles R Authorized by: Linwood Dibbles R Consent: The procedure was performed in an emergent situation. Indications: respiratory distress and airway protection Intubation method: video-assisted Patient status: paralyzed (RSI) Preoxygenation: nonrebreather mask Sedatives: etomidate Paralytic: succinylcholine Laryngoscope size: Miller 4 Tube size: 7.0 mm Tube type: cuffed Number of attempts: 2 (Unable to pass a 7.5 tube initially) Ventilation between attempts: BVM Cricoid pressure: yes Cords visualized: yes Post-procedure assessment: chest rise and CO2 detector Breath sounds: equal Cuff inflated: yes Tube secured with: ETT holder Chest x-ray interpreted by radiologist. Chest x-ray findings: endotracheal tube too low Tube repositioned: tube repositioned successfully Patient tolerance: Patient tolerated the procedure well with no immediate complications.  CRITICAL CARE Performed by: Linwood Dibbles R Authorized by: Linwood Dibbles R Total critical care time: 40 minutes Critical care time was exclusive of separately billable procedures and treating other patients. Critical care was necessary to treat or prevent imminent or life-threatening deterioration of the following conditions: respiratory failure. Critical care was time spent personally by me on the following activities: discussions with consultants, evaluation of patient's response to treatment, examination of patient and vascular access procedures.   (including critical care time)  DIAGNOSTIC STUDIES: Oxygen  Saturation is 74% on room air, normal by my interpretation.    COORDINATION OF CARE:  10:36 AM- Ordered intubation for the pt.   10:39 AM- EMERGENT INTUBATION PROCEDURE NOTE 1230  the patient is still moving despite propofol and fentanyl infusions. We'll give her a dose of vecuronium. Blood pressure has decreased. She will get a fluid bolus and will continue to monitor.   1245  Case discussed with hospitalist.  Will admit to ICU  Labs Review Labs Reviewed  CBC WITH DIFFERENTIAL - Abnormal; Notable for the following:    WBC 12.2 (*)    Lymphs Abs 4.7 (*)    Eosinophils Relative 8 (*)    Eosinophils Absolute 1.0 (*)    All other components within normal limits  COMPREHENSIVE METABOLIC PANEL - Abnormal; Notable for the following:    Glucose, Bld 217 (*)    Calcium 8.1 (*)  Total Bilirubin 0.2 (*)    All other components within normal limits  URINALYSIS, ROUTINE W REFLEX MICROSCOPIC - Abnormal; Notable for the following:    Hgb urine dipstick SMALL (*)    All other components within normal limits  BLOOD GAS, ARTERIAL - Abnormal; Notable for the following:    pH, Arterial 7.195 (*)    pCO2 arterial 54.3 (*)    pO2, Arterial 416.0 (*)    Acid-base deficit 6.6 (*)    All other components within normal limits  URINE MICROSCOPIC-ADD ON - Abnormal; Notable for the following:    Bacteria, UA MANY (*)    All other components within normal limits  URINE RAPID DRUG SCREEN (HOSP PERFORMED)   Imaging Review Dg Chest Port 1 View  09/19/2012   *RADIOLOGY REPORT*  Clinical Data: Respiratory distress  PORTABLE CHEST - 1 VIEW  Comparison:  June 30, 2012  Findings:  Endotracheal tube is 1.9 cm above the carina. Nasogastric tube tip is below the diaphragm.  No pneumothorax.  There is underlying emphysema.  There is no edema or consolidation. The heart size is normal.  Pulmonary vascularity reflects underlying emphysema.  No adenopathy.  There is evidence of an old healed fracture of the left  posterior lateral tenth rib.  There is a nipple shadow on the left.  IMPRESSION: Tube positions as described.  No pneumothorax.  Underlying emphysema.  No edema or consolidation.   Original Report Authenticated By: Bretta Bang, M.D.    MDM   1. Acute respiratory failure   2. COPD (chronic obstructive pulmonary disease)    Patient presents with acute respiratory distress and altered mental status. I believe her altered mental status is due to her metabolic acidosis and hypercarbia.  Head CT was ordered however to evaluate for any acute intracranial pathology. The patient's blood pressure decreased to believe that is related to her sedative medications.  We'll continue to monitor closely. The patient will be admitted to the intensive care unit.   I personally performed the services described in this documentation, which was scribed in my presence.  The recorded information has been reviewed and is accurate.    Misty Kras, MD 09/19/12 [phone]

## 2012-09-19 NOTE — ED Notes (Signed)
RSI Note:  Etomidate 15 mg given 10:43 AM  Succinylcholine 100 mg given 10:43 AM  Patient intubated by Dr Roselyn Bering 10:47 AM with 7.82mm ETT, 22 cm at teeth. Positive color change, bilateral breath sounds

## 2012-09-19 NOTE — ED Notes (Signed)
Son at bedside, updated to pt current status. Pt states " we go through this 3 or 4 times a year." Pt is calm at this time.

## 2012-09-19 NOTE — ED Notes (Signed)
Wrist restrains placed on pt per verbal order from EDP. Pt continues to flail about. Fentanyl increased to 400 mcg/hr., diprivan remains decreased to 20 mcg/kg/min. edp notified of difficulty with sedation of said pt.

## 2012-09-19 NOTE — ED Notes (Signed)
Pt given 125mg  solumedrol,  10mg  albuterol given and 5 atrovent. Pt non-verbal at arrival, hob elevated. sats 87%

## 2012-09-19 NOTE — ED Notes (Signed)
Pt unable to answer any ?'s, moans to ?'s, unable to follow commands.

## 2012-09-19 NOTE — Progress Notes (Signed)
UR chart review completed.  

## 2012-09-19 NOTE — H&P (Signed)
[facility] History and Physical  Misty Richardson WUJ:[id number] DOB: [birth year] DOA: 09/19/2012  Referring physician:  PCP: Cassell Smiles., MD  Specialists:   Chief Complaint: dyspnea  HPI: Misty Richardson is a 57 y.o. female with a past medical history that includes COPD, MGUS presents to the emergency department via [facility] with the chief complaint of shortness of breath. Information is obtained from the chart and emergency department staff and EMS records as patient is currently intubated and there is no family available. MS reports that he went to the home and found the patient in a tripod position and respiratory distress. There were inhalers close by. She was given nebulizer treatment and transported to the emergency department. Oxygen saturation level 74%. In the emergency department she was given an hour-long nebulizer treatment as well as 125 mg of Solu-Medrol. She became confused and combative and required sedation and intubation. Lab work in the emergency department significant for an ABG yielding pH of 7.19 PCO2 54.3 PCO2 416, WBC 12.2, calcium 8.1. Chest x-ray yields no edema or consolidation with intubation tube in place. Underlying emphysema. EKG yields sinus tach. Vital signs in the emergency department significant for a temperature of 95.8 heart rate of 117 and blood pressure 106/56. CT of the head results pending at the time of my examination. [facility] were asked to admit.  Review of Systems: Unable to obtain at the point of admission do to patient's encephalopathy and intubation.  Past Medical History  Diagnosis Date  . COPD (chronic obstructive pulmonary disease)   . MGUS (monoclonal gammopathy of unknown significance) 09/05/2012  . COPD (chronic obstructive pulmonary disease) 09/05/2012   Past Surgical History  Procedure Laterality Date  . Abdominal hysterectomy    . Appendectomy    . Cesarean section     Social History:  reports that she has quit  smoking. Son reports she continues to smoke.  Her smoking use included Cigarettes. She smoked 1.00 pack per day. She reports that  drinks alcohol. She reports that she does not use illicit drugs.   Allergies  Allergen Reactions  . Avelox [Moxifloxacin Hcl In Nacl] Rash    Family History  Problem Relation Age of Onset  . Heart failure Father      Prior to Admission medications   Medication Sig Start Date End Date Taking? Authorizing Provider  albuterol (ACCUNEB) 1.25 MG/3ML nebulizer solution Take 1 ampule by nebulization every 6 (six) hours as needed. For shortness of breath   Yes Historical Provider, MD  albuterol (PROVENTIL HFA;VENTOLIN HFA) 108 (90 BASE) MCG/ACT inhaler Inhale 2 puffs into the lungs every 6 (six) hours as needed for wheezing or shortness of breath.   Yes Historical Provider, MD  ALPRAZolam Prudy Feeler) 0.5 MG tablet Take 0.5 mg by mouth at bedtime. For anxiety   Yes Historical Provider, MD  buPROPion (WELLBUTRIN SR) 100 MG 12 hr tablet Take 100 mg by mouth 3 (three) times daily.  07/11/12  Yes Historical Provider, MD  cyclobenzaprine (FLEXERIL) 10 MG tablet Take 10 mg by mouth 3 (three) times daily as needed for muscle spasms.  08/22/12  Yes Historical Provider, MD  HYDROcodone-acetaminophen (NORCO/VICODIN) 5-325 MG per tablet Take 1 tablet by mouth every 6 (six) hours as needed for pain.   Yes Historical Provider, MD  potassium chloride SA (K-DUR,KLOR-CON) 20 MEQ tablet Take 1 tablet (20 mEq total) by mouth 2 (two) times daily. 08/28/12  Yes Ellouise Newer, PA-C  acetaminophen (TYLENOL) 500 MG tablet Take  1,000 mg by mouth daily as needed for pain.    Historical Provider, MD   Physical Exam: Filed Vitals:   09/19/12 1240  BP: 106/56  Pulse: 99  Temp: 95.8 F (35.4 C)  Resp: 12     General:  Petite well-nourished intubated arouses to verbal stimuli  ENT: Ears clear nose without drainage patient is orally intubated mucous membranes of her mouth are slightly dry but  pink   Neck: Supple no JVD no lymphadenopathy   Cardiovascular: Tachycardic but regular  No murmur gallop or rub no lower extremity edema pedal pulses present and palpable  Respiratory: Patient is orally intubated. Breath sounds somewhat distant with diffuse wheezing. No crackles   Abdomen: Flat soft positive bowel sounds nontender to palpation   Skin: Warm and dry no rash no lesions   Musculoskeletal: No joint swelling/erythema no clubbing no cyanosis   Psychiatric: Requiring sedation before intubation do to respiratory distress   Neurologic: Opens eyes to verbal stimulus attempts to follow commands. Somewhat restless  Labs on Admission:  Basic Metabolic Panel:  Recent Labs Lab 09/19/12 1100  NA 138  K 4.2  CL 108  CO2 24  GLUCOSE 217*  BUN 15  CREATININE 0.63  CALCIUM 8.1*   Liver Function Tests:  Recent Labs Lab 09/19/12 1100  AST 25  ALT 16  ALKPHOS 90  BILITOT 0.2*  PROT 7.3  ALBUMIN 3.9   No results found for this basename: LIPASE, AMYLASE,  in the last 168 hours No results found for this basename: AMMONIA,  in the last 168 hours CBC:  Recent Labs Lab 09/19/12 1100  WBC 12.2*  NEUTROABS 6.1  HGB 13.2  HCT 39.4  MCV 94.9  PLT PLATELET CLUMPS NOTED ON SMEAR, COUNT APPEARS ADEQUATE   Cardiac Enzymes: No results found for this basename: CKTOTAL, CKMB, CKMBINDEX, TROPONINI,  in the last 168 hours  BNP (last 3 results) No results found for this basename: PROBNP,  in the last 8760 hours CBG: No results found for this basename: GLUCAP,  in the last 168 hours  Radiological Exams on Admission: Dg Chest Port 1 View  09/19/2012   *RADIOLOGY REPORT*  Clinical Data: Respiratory distress  PORTABLE CHEST - 1 VIEW  Comparison:  June 30, 2012  Findings:  Endotracheal tube is 1.9 cm above the carina. Nasogastric tube tip is below the diaphragm.  No pneumothorax.  There is underlying emphysema.  There is no edema or consolidation. The heart size is normal.   Pulmonary vascularity reflects underlying emphysema.  No adenopathy.  There is evidence of an old healed fracture of the left posterior lateral tenth rib.  There is a nipple shadow on the left.  IMPRESSION: Tube positions as described.  No pneumothorax.  Underlying emphysema.  No edema or consolidation.   Original Report Authenticated By: Bretta Bang, M.D.    EKG: Independently reviewed. Sinus tachycardia  Assessment/Plan Principal Problem:   Acute respiratory failure with hypercapnia: Likely related to COPD exacerbation. Patient requiring intubation in the emergency department. Will admit to ICU. Will continue with ventilator support and request pulmonary consult. Chest x-ray without consolidation or edema. Will continue Solu-Medrol. Will hold off on any antibiotics for now. Active Problems:  Encephalopathy acute: Likely related to #1. CT of the head results are pending. Patient does have a white count of 12.2 but no obvious signs of infection at this time. Will continue with supportive therapy specifically intubation with Solu-Medrol. Expect this to resolve once patient's respiratory status improves.  Acidosis: Respiratory. Related to COPD exacerbation and #1. She treatments as above. Will monitor closely. Await pulmonary consult  COPD exacerbation: Trigger unknown. See problem #1 and therapies.    Leukocytosis: White count 12.2 on admission. Urine and chest x-ray unremarkable. May be reactive. Will monitor very closely and initiate antibiotic therapy is indicated.  Tobacco use: will counsel to quit once acute phase of illness passes.    MGUS (monoclonal gammopathy of unknown significance): Chart review indicates recent visit to oncology last month. Currently stable    COPD (chronic obstructive pulmonary disease): See #1.       Code Status: Pulmonology  Family Communication: Left message at mother's phone number noted in chart . Disposition Plan: Home when ready  Time spent: 65  minutes  Gwenyth Bender [facility] Pager [phone]  If 7PM-7AM, please contact night-coverage [email or web address] Password [facility] 09/19/2012, 12:51 PM

## 2012-09-19 NOTE — H&P (Signed)
I have seen and examined this patient and I agree with the above assessment and plan.  Fallen Crisostomo, MD [facility] [phone]  

## 2012-09-19 NOTE — ED Notes (Signed)
RT at bedside, pt transported to ICU with RT and RN at beside.

## 2012-09-19 NOTE — Progress Notes (Signed)
Unable to complete admission history and immunization screening at this time. Patient is on ventilator, and sedated. Brother was unsure of answers.

## 2012-09-20 ENCOUNTER — Inpatient Hospital Stay (HOSPITAL_COMMUNITY): Payer: Medicare Other

## 2012-09-20 DIAGNOSIS — G9340 Encephalopathy, unspecified: Secondary | ICD-10-CM

## 2012-09-20 DIAGNOSIS — D72829 Elevated white blood cell count, unspecified: Secondary | ICD-10-CM

## 2012-09-20 LAB — GLUCOSE, CAPILLARY
Glucose-Capillary: 123 mg/dL — ABNORMAL HIGH (ref 70–99)
Glucose-Capillary: 133 mg/dL — ABNORMAL HIGH (ref 70–99)
Glucose-Capillary: 134 mg/dL — ABNORMAL HIGH (ref 70–99)
Glucose-Capillary: 134 mg/dL — ABNORMAL HIGH (ref 70–99)

## 2012-09-20 LAB — CBC
HCT: 33.1 % — ABNORMAL LOW (ref 36.0–46.0)
MCH: 31.9 pg (ref 26.0–34.0)
MCHC: 32.9 g/dL (ref 30.0–36.0)
MCV: 96.8 fL (ref 78.0–100.0)
RDW: 14.1 % (ref 11.5–15.5)

## 2012-09-20 LAB — BLOOD GAS, ARTERIAL
PEEP: 5 cmH2O
Patient temperature: 37
RATE: 14 resp/min
pO2, Arterial: 83.6 mmHg (ref 80.0–100.0)

## 2012-09-20 LAB — BASIC METABOLIC PANEL
BUN: 8 mg/dL (ref 6–23)
Calcium: 8.3 mg/dL — ABNORMAL LOW (ref 8.4–10.5)
Chloride: 111 mEq/L (ref 96–112)
Creatinine, Ser: 0.72 mg/dL (ref 0.50–1.10)
GFR calc Af Amer: >90 mL/min (ref >90)
GFR calc non Af Amer: >90 mL/min (ref >90)

## 2012-09-20 LAB — LACTIC ACID, PLASMA: Lactic Acid, Venous: 2.2 mmol/L (ref 0.5–2.2)

## 2012-09-20 MED ORDER — PIPERACILLIN-TAZOBACTAM 3.375 G IVPB
3.3750 g | Freq: Three times a day (TID) | INTRAVENOUS | Status: DC
  Administered 2012-09-20 – 2012-09-23 (×7): 3.375 g via INTRAVENOUS
  Filled 2012-09-20 (×16): qty 50

## 2012-09-20 MED ORDER — VANCOMYCIN HCL IN DEXTROSE 1-5 GM/200ML-% IV SOLN
1000.0000 mg | INTRAVENOUS | Status: DC
  Administered 2012-09-20 – 2012-09-22 (×3): 1000 mg via INTRAVENOUS
  Filled 2012-09-20 (×6): qty 200

## 2012-09-20 MED ORDER — ACETAMINOPHEN 650 MG RE SUPP
650.0000 mg | Freq: Four times a day (QID) | RECTAL | Status: DC | PRN
  Filled 2012-09-20: qty 1

## 2012-09-20 NOTE — Progress Notes (Signed)
At 0845, RT attempted wean protocol. Placed patient on CPAP/PS 5/5 and 40% FIO2. At 0905 wean was terminated, patient became diaphorectic with increased HR  Beginning of wean patient HR was 91 and HR increased to 114, Nurse aide checked patient and patient had a temp of 99.6. Tremors also noted in patient; wereas, RT discontinued wean. RT will continue to monitor

## 2012-09-20 NOTE — Progress Notes (Signed)
Patient personally seen and examined by me. Agree with outlined assessment and plan per Ms. Black. Pt remains intubated secondary to acute resp failure likely from COPD exacerbation. Audible wheezing on exam. Pulmonary following. Pt is mildly sedate and easily aroused. Overnight events noted. Pt noted to have mixed resp/metabolic acidosis with elevated lactate - concerns for sepsis. Currently cont on vanc/zosyn. CXR without sig change per my read. Given concerns of sepsis, will check blood and urine cultures.

## 2012-09-20 NOTE — Progress Notes (Addendum)
Tremors noted in bilateral lower and upper extremities. Patient less arousable. Notified MD and he ordered Acetaminophen 650 mg suppository for low grade temp. Increased sedation because pt was restless. Also addressed tube feeding but Dr. Rhona Leavens stated we would hold off to see if she is extubated tomorrow.

## 2012-09-20 NOTE — Progress Notes (Signed)
[facility] PROGRESS NOTE  FINA HEIZER ZOX:[id number] DOB: [birth year] DOA: 09/19/2012 PCP: Cassell Smiles., MD  Assessment/Plan: Acute respiratory failure with hypercapnia: Likely related to COPD exacerbation. Appreciate Dr. Juanetta Gosling assistance. Ventilator adjustments made during night per Elink. Pt requiring less sedation. Remains acidotic. Lactate level pending. Concern for sepsis. Antibiotics broadened. Continue with ventilator support per Dr. Juanetta Gosling. Continue Solu-Medrol.   Active Problems:  Encephalopathy acute: Likely related to #1. CT of the head results without acute abnormality. White count trending up. No obvious signs of infection at this time. Will continue with supportive therapy specifically intubation with Solu-Medrol.Vanc and Zosyn day #2.  Expect this to resolve once patient's respiratory status improves.   Acidosis: Respiratory/metabolic mix. Related to COPD exacerbation and #1. Respiratory status improved but concern over ability to blow off CO2 if weaned. Lactate level pending as concern for sepsis.VSS with somewhat soft BP. Max temp 99.8. Will monitor closely.  COPD exacerbation: Trigger unknown. See problem #1 and therapies.  Leukocytosis: White count trending up somewhat. Urine and chest x-ray unremarkable.    Tobacco use: will counsel to quit once acute phase of illness passes.  MGUS (monoclonal gammopathy of unknown significance): Chart review indicates recent visit to oncology last month. Currently stable   COPD (chronic obstructive pulmonary disease): See #1.     Code Status: full Family Communication: none available Disposition Plan: home when ready   Consultants:  Dr. Juanetta Gosling pulomary  Procedures:  none  Antibiotics:  vanc 09/19/12>>  Zosyn 09/19/12>>  HPI/Subjective: Arouses to verbal stimuli. Follow commands. calm  Objective: Filed Vitals:   09/20/12 0600  BP: 96/53  Pulse: 86  Temp: 99.8 F (37.7 C)  Resp: 14     Intake/Output Summary (Last 24 hours) at 09/20/12 0816 Last data filed at 09/20/12 0600  Gross per 24 hour  Intake 4414.43 ml  Output   1250 ml  Net 3164.43 ml   Filed Weights   09/19/12 1142 09/20/12 0500  Weight: 44.906 kg (99 lb) 50.1 kg (110 lb 7.2 oz)    Exam:   General:  Intubated, calm  Cardiovascular: RRR No MGR No LE edema  Respiratory: intubated, bilateral rhonchi no wheeze  Abdomen: flat soft oral gastric tube intact. +BS no rebounding  Musculoskeletal: MAE no clubbing no cyanosis   Data Reviewed: Basic Metabolic Panel:  Recent Labs Lab 09/19/12 1100 09/20/12 0442  NA 138 142  K 4.2 4.1  CL 108 111  CO2 24 19  GLUCOSE 217* 119*  BUN 15 8  CREATININE 0.63 0.72  CALCIUM 8.1* 8.3*   Liver Function Tests:  Recent Labs Lab 09/19/12 1100  AST 25  ALT 16  ALKPHOS 90  BILITOT 0.2*  PROT 7.3  ALBUMIN 3.9   No results found for this basename: LIPASE, AMYLASE,  in the last 168 hours No results found for this basename: AMMONIA,  in the last 168 hours CBC:  Recent Labs Lab 09/19/12 1100 09/20/12 0442  WBC 12.2* 13.6*  NEUTROABS 6.1  --   HGB 13.2 10.9*  HCT 39.4 33.1*  MCV 94.9 96.8  PLT PLATELET CLUMPS NOTED ON SMEAR, COUNT APPEARS ADEQUATE 271   Cardiac Enzymes: No results found for this basename: CKTOTAL, CKMB, CKMBINDEX, TROPONINI,  in the last 168 hours BNP (last 3 results) No results found for this basename: PROBNP,  in the last 8760 hours CBG:  Recent Labs Lab 09/19/12 1607 09/19/12 2003 09/20/12 0726  GLUCAP 141* 161* 134*    Recent Results (from the past 240  hour(s))  MRSA PCR SCREENING     Status: None   Collection Time    09/19/12  3:10 PM      Result Value Range Status   MRSA by PCR NEGATIVE  NEGATIVE Final   Comment:            The GeneXpert MRSA Assay (FDA     approved for NASAL specimens     only), is one component of a     comprehensive MRSA colonization     surveillance program. It is not     intended  to diagnose MRSA     infection nor to guide or     monitor treatment for     MRSA infections.     Studies: Ct Head Wo Contrast  09/19/2012   CLINICAL DATA:  57 year old female with altered mental status. Respiratory distress.  EXAM: CT HEAD WITHOUT CONTRAST  TECHNIQUE: Contiguous axial images were obtained from the base of the skull through the vertex without intravenous contrast.  COMPARISON:  06/17/2010.  FINDINGS: Visualized paranasal sinuses and mastoids are clear. Visualized orbits and scalp soft tissues are within normal limits. No acute osseous abnormality identified.  Stable and normal cerebral volume. No midline shift, ventriculomegaly, mass effect, evidence of mass lesion, intracranial hemorrhage or evidence of cortically based acute infarction. Gray-white matter differentiation is within normal limits throughout the brain. No suspicious intracranial vascular hyperdensity.  IMPRESSION: Stable and normal noncontrast CT appearance of the brain.   Electronically Signed   By: Augusto Gamble   On: 09/19/2012 13:04   Dg Chest Port 1 View  09/19/2012   *RADIOLOGY REPORT*  Clinical Data: Respiratory distress  PORTABLE CHEST - 1 VIEW  Comparison:  June 30, 2012  Findings:  Endotracheal tube is 1.9 cm above the carina. Nasogastric tube tip is below the diaphragm.  No pneumothorax.  There is underlying emphysema.  There is no edema or consolidation. The heart size is normal.  Pulmonary vascularity reflects underlying emphysema.  No adenopathy.  There is evidence of an old healed fracture of the left posterior lateral tenth rib.  There is a nipple shadow on the left.  IMPRESSION: Tube positions as described.  No pneumothorax.  Underlying emphysema.  No edema or consolidation.   Original Report Authenticated By: Bretta Bang, M.D.    Scheduled Meds: . albuterol  2.5 mg Nebulization Q4H  . antiseptic oral rinse  15 mL Mouth Rinse QID  . chlorhexidine  15 mL Mouth/Throat QID  . heparin  5,000 Units  Subcutaneous Q8H  . ipratropium  0.5 mg Nebulization Q4H  . methylPREDNISolone (SOLU-MEDROL) injection  125 mg Intravenous Q6H  . pantoprazole (PROTONIX) IV  40 mg Intravenous Q12H  . piperacillin-tazobactam (ZOSYN)  IV  3.375 g Intravenous Q8H  . vancomycin  1,000 mg Intravenous Q24H   Continuous Infusions: . sodium chloride 1,000 mL (09/20/12 0600)  . sodium chloride 1,000 mL (09/19/12 1458)  . fentaNYL infusion INTRAVENOUS 150 mcg/hr (09/20/12 0600)  . propofol 20 mcg/kg/min (09/20/12 0600)    Principal Problem:   Acute respiratory failure with hypercapnia Active Problems:   Low blood potassium   MGUS (monoclonal gammopathy of unknown significance)   COPD (chronic obstructive pulmonary disease)   Leukocytosis   COPD exacerbation   Encephalopathy acute   Respiratory acidosis   Tobacco use disorder    Time spent: 40 minutes    [facility] M  [facility] Pager [phone]. If 7PM-7AM, please contact night-coverage at [email or web address], password [facility] 09/20/2012, 8:16 AM  LOS: 1 day

## 2012-09-20 NOTE — Progress Notes (Signed)
ANTIBIOTIC CONSULT NOTE - INITIAL  Pharmacy Consult for Vancomycin & Zosyn Indication: rule out sepsis  Allergies  Allergen Reactions  . Avelox [Moxifloxacin Hcl In Nacl] Rash    Patient Measurements: Height: 5\' 3"  (160 cm) Weight: 110 lb 7.2 oz (50.1 kg) IBW/kg (Calculated) : 52.4  Vital Signs: Temp: 99.8 F (37.7 C) (09/03 0600) Temp src: Axillary (09/03 0000) BP: 96/53 mmHg (09/03 0600) Pulse Rate: 86 (09/03 0600) Intake/Output from previous day: 09/02 0701 - 09/03 0700 In: 4414.4 [I.V.:4114.4; IV Piggyback:300] Out: 1250 [Urine:1250] Intake/Output from this shift:    Labs:  Recent Labs  09/19/12 1100 09/20/12 0442  WBC 12.2* 13.6*  HGB 13.2 10.9*  PLT PLATELET CLUMPS NOTED ON SMEAR, COUNT APPEARS ADEQUATE 271  CREATININE 0.63 0.72   Estimated Creatinine Clearance: 62.1 ml/min (by C-G formula based on Cr of 0.72). No results found for this basename: VANCOTROUGH, Leodis Binet, VANCORANDOM, GENTTROUGH, GENTPEAK, GENTRANDOM, TOBRATROUGH, TOBRAPEAK, TOBRARND, AMIKACINPEAK, AMIKACINTROU, AMIKACIN,  in the last 72 hours   Microbiology: Recent Results (from the past 720 hour(s))  MRSA PCR SCREENING     Status: None   Collection Time    09/19/12  3:10 PM      Result Value Range Status   MRSA by PCR NEGATIVE  NEGATIVE Final   Comment:            The GeneXpert MRSA Assay (FDA     approved for NASAL specimens     only), is one component of a     comprehensive MRSA colonization     surveillance program. It is not     intended to diagnose MRSA     infection nor to guide or     monitor treatment for     MRSA infections.    Medical History: Past Medical History  Diagnosis Date  . COPD (chronic obstructive pulmonary disease)   . MGUS (monoclonal gammopathy of unknown significance) 09/05/2012  . COPD (chronic obstructive pulmonary disease) 09/05/2012    Medications:  Scheduled:  . albuterol  2.5 mg Nebulization Q4H  . antiseptic oral rinse  15 mL Mouth Rinse QID   . chlorhexidine  15 mL Mouth/Throat QID  . heparin  5,000 Units Subcutaneous Q8H  . ipratropium  0.5 mg Nebulization Q4H  . methylPREDNISolone (SOLU-MEDROL) injection  125 mg Intravenous Q6H  . pantoprazole (PROTONIX) IV  40 mg Intravenous Q12H  . piperacillin-tazobactam (ZOSYN)  IV  3.375 g Intravenous Q8H  . vancomycin  1,000 mg Intravenous Q24H   Assessment: 57 yo F admitted with COPD exacerbation currently with VDRF.   Lactic acid level elevated.   She received Rocephin & Zithromax x1 dose on admission.  Renal function is at patient's baseline.  Cx data pending.   Goal of Therapy:  Vancomycin trough level 15-20 mcg/ml  Plan:  Zosyn 3.375gm IV Q8h to be infused over 4hrs Vancomycin 1gm IV q24h Check Vancomycin trough at steady state Monitor renal function, cx data, and patient progress  Elson Clan 09/20/2012,8:46 AM

## 2012-09-20 NOTE — Consult Note (Signed)
Misty Richardson, Misty Richardson              ACCOUNT NO.:  [id number]  MEDICAL RECORD NO.:  [id number]  LOCATION:  IC09                          FACILITY:  [facility]  PHYSICIAN:  Allina Riches L. Juanetta Gosling, M.D.DATE OF BIRTH:  27-Mar-1955  DATE OF CONSULTATION: DATE OF DISCHARGE:                                CONSULTATION   The patient of the [facility].  REASON FOR CONSULTATION:  Respiratory failure.  HISTORY:  This is a 57 year old, who came to the emergency room with shortness of breath.  When she was seen in the ER, she was in respiratory distress and she was intubated and started on mechanical ventilation.  Consultation requested for help with management of respiratory failure.  PAST MEDICAL HISTORY:  COPD, myoclonal gammopathy of unknown significance.  PAST SURGICAL HISTORY:  Surgically, she has had hysterectomy, appendectomy, and C-section.  SOCIAL HISTORY:  She has about a 100-pack year smoking history.  She uses alcohol, but the amount is unclear.  She does not use any illicit drugs.  FAMILY HISTORY:  Positive for heart failure in her father.  MEDICATIONS AT HOME: 1. She takes albuterol by nebulizer every 6 hours as needed and     albuterol inhaler as needed. 2. Xanax 0.5 at bedtime. 3. Wellbutrin SR 100 mg 3 times a day. 4. Flexeril 10 mg 3 times a day. 5. Hydrocodone 5/325 every 6 hours as needed for pain. 6. Potassium chloride 20 mEq b.i.d. 7. Tylenol p.r.n.  REVIEW OF SYSTEMS:  Not obtainable since she is intubated and on mechanical ventilation.  PHYSICAL EXAMINATION:  GENERAL:  She is intubated, sedated, and on the ventilator. VITAL SIGNS:  Temperature is 95.9, pulse 95, respirations 20, blood pressure 94/46, O2 saturation 100%.  She is intubated and on the ventilator and sedated. HEENT:  Her pupils do react.  Her nose and throat are clear.  Mucous membranes are slightly dry. NECK:  Supple without masses. CHEST:  Clear with decreased breath sounds and prolonged  expiration. HEART:  Regular without gallop. ABDOMEN:  Soft with no masses. EXTREMITIES:  No edema. CENTRAL NERVOUS SYSTEM:  She is intubated and sedated.  Her repeat blood gas after being intubated and placed on the ventilator shows that she still has fairly marked respiratory azotemia.  I think she needs changes in her ventilator settings.  I am not sure that she has pneumonia.  She does have acute respiratory failure and has chronic obstructive pulmonary disease.  We will plan to go ahead and have her on Solu-Medrol.  I would add antibiotics and I will increase her respiratory rate and tidal volume.  No other new treatments now.     Maurisa Tesmer L. Juanetta Gosling, M.D.     ELH/MEDQ  D:  09/19/2012  T:  09/20/2012  Job:  [id number]

## 2012-09-20 NOTE — Progress Notes (Signed)
INITIAL NUTRITION ASSESSMENT  DOCUMENTATION CODES Per approved criteria  -Not Applicable   INTERVENTION: Initiate continuous Oxepa @ 20 ml/hr via OGT. Add ProStat 30 ml TID.  At goal rate, tube feeding regimen will provide 1020 kcal, 75 grams of protein, and 377 ml of H2O.   Add MVI daily to meet RDI's  Nutrition support will meet 100% estimated protein and 98% energy needs. Note propofol calories included.  NUTRITION DIAGNOSIS: Inadequate oral intake related to inability to eat as evidenced by NPO status.  Goal: EN to meet >/= 90% of their estimated nutrition needs if pt unable to successfully meet nutrition needs orally.   Monitor:  Nutrition support measures, respiratory status, I/O's labs, wt changes  Reason for Assessment: pt intubated, sedated  57 y.o. female  Admitting Dx: Acute respiratory failure with hypercapnia  ASSESSMENT: Patient Active Problem List   Diagnosis Date Noted  . Acute respiratory failure with hypercapnia 09/19/2012  . Leukocytosis 09/19/2012  . COPD exacerbation 09/19/2012  . Encephalopathy acute 09/19/2012  . Respiratory acidosis 09/19/2012  . Tobacco use disorder 09/19/2012  . MGUS (monoclonal gammopathy of unknown significance) 09/05/2012  . COPD (chronic obstructive pulmonary disease) 09/05/2012  . Low blood potassium 01/26/2011   Patient is currently intubated on ventilator support. Acute respiratory distress, acidosis and potentailly septic. Blood and urine cultures pending. MV: 8.1 L/min Temp:Temp (24hrs), Avg:99.2 F (37.3 C), Min:95.8 F (35.4 C), Max:100.7 F (38.2 C)  Propofol: 5.4 ml/hr (142 kcal-lipids) if maintained at current rate over 24 hr period.  Height: Ht Readings from Last 1 Encounters:  09/20/12 5\' 3"  (1.6 m)    Weight: Wt Readings from Last 1 Encounters:  09/20/12 110 lb 7.2 oz (50.1 kg)    Ideal Body Weight: 115#  % Ideal Body Weight: 96%  Wt Readings from Last 10 Encounters:  09/20/12 110 lb 7.2 oz  (50.1 kg)  09/05/12 99 lb 6.4 oz (45.088 kg)  06/30/12 98 lb (44.453 kg)  05/22/12 98 lb (44.453 kg)  04/18/12 98 lb (44.453 kg)  02/02/12 99 lb 12.8 oz (45.269 kg)  01/19/12 98 lb (44.453 kg)  11/14/11 98 lb (44.453 kg)  08/02/11 98 lb (44.453 kg)  07/25/11 98 lb (44.453 kg)    Usual Body Weight: 98-99#  % Usual Body Weight: 111%  BMI:  Body mass index is 19.57 kg/(m^2). normal range  Estimated Nutritional Needs: Kcal: 1288 Protein: 75 gr Fluid: 1600 ml/day (normal needs)  Skin: No issues noted  Diet Order: NPO  EDUCATION NEEDS: -No education needs identified at this time   Intake/Output Summary (Last 24 hours) at 09/20/12 1206 Last data filed at 09/20/12 0600  Gross per 24 hour  Intake 4414.43 ml  Output   1250 ml  Net 3164.43 ml    Last BM:  PTA  Labs:   Recent Labs Lab 09/19/12 1100 09/20/12 0442  NA 138 142  K 4.2 4.1  CL 108 111  CO2 24 19  BUN 15 8  CREATININE 0.63 0.72  CALCIUM 8.1* 8.3*  GLUCOSE 217* 119*    CBG (last 3)   Recent Labs  09/19/12 2003 09/20/12 0726 09/20/12 1138  GLUCAP 161* 134* 154*    Scheduled Meds: . albuterol  2.5 mg Nebulization Q4H  . antiseptic oral rinse  15 mL Mouth Rinse QID  . chlorhexidine  15 mL Mouth/Throat QID  . heparin  5,000 Units Subcutaneous Q8H  . ipratropium  0.5 mg Nebulization Q4H  . methylPREDNISolone (SOLU-MEDROL) injection  125 mg Intravenous  Q6H  . pantoprazole (PROTONIX) IV  40 mg Intravenous Q12H  . piperacillin-tazobactam (ZOSYN)  IV  3.375 g Intravenous Q8H  . vancomycin  1,000 mg Intravenous Q24H    Continuous Infusions: . sodium chloride 1,000 mL (09/20/12 0600)  . sodium chloride 1,000 mL (09/20/12 0956)  . fentaNYL infusion INTRAVENOUS 150 mcg/hr (09/20/12 0600)  . propofol 20 mcg/kg/min (09/20/12 0600)    Past Medical History  Diagnosis Date  . COPD (chronic obstructive pulmonary disease)   . MGUS (monoclonal gammopathy of unknown significance) 09/05/2012  . COPD  (chronic obstructive pulmonary disease) 09/05/2012    Past Surgical History  Procedure Laterality Date  . Abdominal hysterectomy    . Appendectomy    . Cesarean section      Royann Shivers MS,RD,LDN,CSG Office: #[phone] Pager: [phone]

## 2012-09-20 NOTE — Progress Notes (Signed)
Subjective: She is awake and responsive. She remains intubated and on the ventilator. The help from [facility] physicians is noted and appreciated. She had metabolic acidosis with elevated lactate.  Objective: Vital signs in last 24 hours: Temp:  [95.8 F (35.4 C)-99.8 F (37.7 C)] 99.8 F (37.7 C) (09/03 0600) Pulse Rate:  [83-117] 86 (09/03 0600) Resp:  [8-26] 14 (09/03 0600) BP: (90-136)/(45-88) 96/53 mmHg (09/03 0600) SpO2:  [74 %-100 %] 96 % (09/03 0733) FiO2 (%):  [30 %-100 %] 30 % (09/03 0408) Weight:  [44.906 kg (99 lb)-50.1 kg (110 lb 7.2 oz)] 50.1 kg (110 lb 7.2 oz) (09/03 0500) Weight change:  Last BM Date:  (unknown)  Intake/Output from previous day: 09/02 0701 - 09/03 0700 In: 4414.4 [I.V.:4114.4; IV Piggyback:300] Out: 1250 [Urine:1250]  PHYSICAL EXAM General appearance: alert, cooperative and mild distress Resp: rhonchi bilaterally Cardio: regular rate and rhythm, S1, S2 normal, no murmur, click, rub or gallop GI: soft, non-tender; bowel sounds normal; no masses,  no organomegaly Extremities: extremities normal, atraumatic, no cyanosis or edema  Lab Results:    Basic Metabolic Panel:  Recent Labs  16/10/96 1100 09/20/12 0442  NA 138 142  K 4.2 4.1  CL 108 111  CO2 24 19  GLUCOSE 217* 119*  BUN 15 8  CREATININE 0.63 0.72  CALCIUM 8.1* 8.3*   Liver Function Tests:  Recent Labs  09/19/12 1100  AST 25  ALT 16  ALKPHOS 90  BILITOT 0.2*  PROT 7.3  ALBUMIN 3.9   No results found for this basename: LIPASE, AMYLASE,  in the last 72 hours No results found for this basename: AMMONIA,  in the last 72 hours CBC:  Recent Labs  09/19/12 1100 09/20/12 0442  WBC 12.2* 13.6*  NEUTROABS 6.1  --   HGB 13.2 10.9*  HCT 39.4 33.1*  MCV 94.9 96.8  PLT PLATELET CLUMPS NOTED ON SMEAR, COUNT APPEARS ADEQUATE 271   Cardiac Enzymes: No results found for this basename: CKTOTAL, CKMB, CKMBINDEX, TROPONINI,  in the last 72 hours BNP: No results found for this  basename: PROBNP,  in the last 72 hours D-Dimer:  Recent Labs  09/19/12 1816  DDIMER 0.37   CBG:  Recent Labs  09/19/12 1607 09/19/12 2003 09/20/12 0726  GLUCAP 141* 161* 134*   Hemoglobin A1C: No results found for this basename: HGBA1C,  in the last 72 hours Fasting Lipid Panel: No results found for this basename: CHOL, HDL, LDLCALC, TRIG, CHOLHDL, LDLDIRECT,  in the last 72 hours Thyroid Function Tests: No results found for this basename: TSH, T4TOTAL, FREET4, T3FREE, THYROIDAB,  in the last 72 hours Anemia Panel: No results found for this basename: VITAMINB12, FOLATE, FERRITIN, TIBC, IRON, RETICCTPCT,  in the last 72 hours Coagulation: No results found for this basename: LABPROT, INR,  in the last 72 hours Urine Drug Screen: Drugs of Abuse     Component Value Date/Time   LABOPIA NONE DETECTED 09/19/2012 1135   COCAINSCRNUR NONE DETECTED 09/19/2012 1135   LABBENZ NONE DETECTED 09/19/2012 1135   AMPHETMU NONE DETECTED 09/19/2012 1135   THCU NONE DETECTED 09/19/2012 1135   LABBARB NONE DETECTED 09/19/2012 1135    Alcohol Level: No results found for this basename: ETH,  in the last 72 hours Urinalysis:  Recent Labs  09/19/12 1122  COLORURINE YELLOW  LABSPEC 1.020  PHURINE 5.5  GLUCOSEU NEGATIVE  HGBUR SMALL*  BILIRUBINUR NEGATIVE  KETONESUR NEGATIVE  PROTEINUR NEGATIVE  UROBILINOGEN 0.2  NITRITE NEGATIVE  LEUKOCYTESUR NEGATIVE  Misc. Labs:  ABGS  Recent Labs  09/19/12 1745  09/20/12 0505  PHART 7.093*  < > 7.295*  PO2ART 130.0*  < > 83.6  TCO2 18.0  --   --   HCO3 18.2*  < > 18.4*  < > = values in this interval not displayed. CULTURES Recent Results (from the past 240 hour(s))  MRSA PCR SCREENING     Status: None   Collection Time    09/19/12  3:10 PM      Result Value Range Status   MRSA by PCR NEGATIVE  NEGATIVE Final   Comment:            The GeneXpert MRSA Assay (FDA     approved for NASAL specimens     only), is one component of a      comprehensive MRSA colonization     surveillance program. It is not     intended to diagnose MRSA     infection nor to guide or     monitor treatment for     MRSA infections.   Studies/Results: Ct Head Wo Contrast  09/19/2012   CLINICAL DATA:  57 year old female with altered mental status. Respiratory distress.  EXAM: CT HEAD WITHOUT CONTRAST  TECHNIQUE: Contiguous axial images were obtained from the base of the skull through the vertex without intravenous contrast.  COMPARISON:  06/17/2010.  FINDINGS: Visualized paranasal sinuses and mastoids are clear. Visualized orbits and scalp soft tissues are within normal limits. No acute osseous abnormality identified.  Stable and normal cerebral volume. No midline shift, ventriculomegaly, mass effect, evidence of mass lesion, intracranial hemorrhage or evidence of cortically based acute infarction. Gray-white matter differentiation is within normal limits throughout the brain. No suspicious intracranial vascular hyperdensity.  IMPRESSION: Stable and normal noncontrast CT appearance of the brain.   Electronically Signed   By: Augusto Gamble   On: 09/19/2012 13:04   Dg Chest Port 1 View  09/19/2012   *RADIOLOGY REPORT*  Clinical Data: Respiratory distress  PORTABLE CHEST - 1 VIEW  Comparison:  June 30, 2012  Findings:  Endotracheal tube is 1.9 cm above the carina. Nasogastric tube tip is below the diaphragm.  No pneumothorax.  There is underlying emphysema.  There is no edema or consolidation. The heart size is normal.  Pulmonary vascularity reflects underlying emphysema.  No adenopathy.  There is evidence of an old healed fracture of the left posterior lateral tenth rib.  There is a nipple shadow on the left.  IMPRESSION: Tube positions as described.  No pneumothorax.  Underlying emphysema.  No edema or consolidation.   Original Report Authenticated By: Bretta Bang, M.D.    Medications:  Scheduled: . albuterol  2.5 mg Nebulization Q4H  . antiseptic oral rinse   15 mL Mouth Rinse QID  . azithromycin  500 mg Intravenous Q24H  . cefTRIAXone (ROCEPHIN)  IV  1 g Intravenous Q24H  . chlorhexidine  15 mL Mouth/Throat QID  . heparin  5,000 Units Subcutaneous Q8H  . ipratropium  0.5 mg Nebulization Q4H  . methylPREDNISolone (SOLU-MEDROL) injection  125 mg Intravenous Q6H  . pantoprazole (PROTONIX) IV  40 mg Intravenous Q12H   Continuous: . sodium chloride 1,000 mL (09/20/12 0600)  . sodium chloride 1,000 mL (09/19/12 1458)  . fentaNYL infusion INTRAVENOUS 150 mcg/hr (09/20/12 0600)  . propofol 20 mcg/kg/min (09/20/12 0600)   WUJ:WJXBJY chloride, fentaNYL, fentaNYL  Assesment: She has acute respiratory failure. She seems to have a separate metabolic acidosis. This morning her pH  is 7.29 with a normal PCO2. Her lactate level was elevated yesterday. Principal Problem:   Acute respiratory failure with hypercapnia Active Problems:   Low blood potassium   MGUS (monoclonal gammopathy of unknown significance)   COPD (chronic obstructive pulmonary disease)   Leukocytosis   COPD exacerbation   Encephalopathy acute   Respiratory acidosis   Tobacco use disorder    Plan: Recheck lactate level. From a purely respiratory point of view she may be ready to come off the ventilator but she may have some difficulty in compensating on her own for the metabolic acidemia. She was hypokalemic I wonder if she's perhaps septic. I think we should broaden matter gliotic coverage until we are certain why she has the metabolic acidemia and I've gone ahead and done that    LOS: 1 day   Ranvir Renovato L 09/20/2012, 7:45 AM

## 2012-09-20 NOTE — Care Management Note (Unsigned)
    Page 1 of 1   09/21/2012     2:51:32 PM   CARE MANAGEMENT NOTE 09/21/2012  Patient:  ERIAL, FIKES   Account Number:  [id number]  Date Initiated:  09/20/2012  Documentation initiated by:  Sharrie Rothman  Subjective/Objective Assessment:   Pt admitted from home with respiratory failure. Pt currently on ventilator and no family present.     Action/Plan:   CM will reasses home needs once pt off ventilator.   Anticipated DC Date:  09/27/2012   Anticipated DC Plan:  HOME W HOME HEALTH SERVICES      DC Planning Services  CM consult      Choice offered to / List presented to:             Status of service:  In process, will continue to follow Medicare Important Message given?   (If response is "NO", the following Medicare IM given date fields will be blank) Date Medicare IM given:   Date Additional Medicare IM given:    Discharge Disposition:    Per UR Regulation:    If discussed at Long Length of Stay Meetings, dates discussed:    Comments:  09/21/12 1450 Arlyss Queen, RN BSN CM Pt off ventilator and able to have conversation with CM. Pt stated that she lives with 2 sons and she is never left alone. Pt stated that she is independent with ADL's and and has no HH. Pt may need HH and home O2 at discharge. Cm explained process of qualify home O2 and pt verbalized understanding.  09/20/12 1455 Arlyss Queen, RN BSN CM

## 2012-09-21 ENCOUNTER — Inpatient Hospital Stay (HOSPITAL_COMMUNITY): Payer: Medicare Other

## 2012-09-21 DIAGNOSIS — J449 Chronic obstructive pulmonary disease, unspecified: Secondary | ICD-10-CM

## 2012-09-21 DIAGNOSIS — J4489 Other specified chronic obstructive pulmonary disease: Secondary | ICD-10-CM

## 2012-09-21 LAB — CBC
HCT: 31 % — ABNORMAL LOW (ref 36.0–46.0)
MCH: 31.7 pg (ref 26.0–34.0)
MCHC: 33.5 g/dL (ref 30.0–36.0)
RDW: 14.3 % (ref 11.5–15.5)

## 2012-09-21 LAB — BASIC METABOLIC PANEL
BUN: 15 mg/dL (ref 6–23)
Calcium: 8.3 mg/dL — ABNORMAL LOW (ref 8.4–10.5)
GFR calc Af Amer: >90 mL/min (ref >90)
GFR calc non Af Amer: >90 mL/min (ref >90)
Potassium: 3.5 mEq/L (ref 3.5–5.1)

## 2012-09-21 LAB — GLUCOSE, CAPILLARY
Glucose-Capillary: 131 mg/dL — ABNORMAL HIGH (ref 70–99)
Glucose-Capillary: 130 mg/dL — ABNORMAL HIGH (ref 70–99)
Glucose-Capillary: 128 mg/dL — ABNORMAL HIGH (ref 70–99)

## 2012-09-21 LAB — BLOOD GAS, ARTERIAL
Acid-base deficit: 1.2 mmol/L (ref 0.0–2.0)
Bicarbonate: 23.1 mEq/L (ref 20.0–24.0)
Drawn by: 38235
FIO2: 0.4 %
MECHVT: 550 mL
O2 Saturation: 98.8 %
Patient temperature: 37
Pressure support: 5 cmH2O
RATE: 14 resp/min
pCO2 arterial: 45 mmHg (ref 35.0–45.0)
pH, Arterial: 7.332 — ABNORMAL LOW (ref 7.350–7.450)
pO2, Arterial: 144 mmHg — ABNORMAL HIGH (ref 80.0–100.0)

## 2012-09-21 MED ORDER — ONDANSETRON HCL 4 MG/2ML IJ SOLN
4.0000 mg | INTRAMUSCULAR | Status: DC | PRN
  Administered 2012-09-21 – 2012-09-23 (×6): 4 mg via INTRAVENOUS
  Filled 2012-09-21 (×6): qty 2

## 2012-09-21 MED ORDER — ONDANSETRON HCL 4 MG PO TABS: 4.0000 mg | ORAL_TABLET | ORAL | Status: DC | PRN

## 2012-09-21 MED ORDER — PNEUMOCOCCAL VAC POLYVALENT 25 MCG/0.5ML IJ INJ
0.5000 mL | INJECTION | INTRAMUSCULAR | Status: AC
  Filled 2012-09-21: qty 0.5

## 2012-09-21 NOTE — Progress Notes (Signed)
Patient extubated at 0955. Patient sitting in chair position in bed, tolerating ice chips without coughing. Family and friends at bedside, attentive and supportive. O2 sat 94% on 2.5  l [place]. Vital signs stable.

## 2012-09-21 NOTE — Progress Notes (Signed)
At 0820, SBT was performed, patient was placed on CPAP/PS of  5/5 and 40% FIO2 and patient was tolerating very well. At 0845, RT performed weaning parameters on patient, patient's NIF -40 and FVC 550. Patient also had an audible leak around ET tube when cuff was deflated. ABG was done and results called to physician. RT received orders to extubate. RT extubated without any complications noted to 4L O2 via nasal cannula. Patient's vitals remained stable throughout. HR 95, RR 16, and SAT's 96%. RN at bedside. RT will continue to monitor

## 2012-09-21 NOTE — Progress Notes (Signed)
[facility] PROGRESS NOTE  Misty Richardson OZH:[id number] DOB: 01/29/1955 DOA: 09/19/2012 PCP: Cassell Smiles., MD  Assessment/Plan: Acute respiratory failure with hypercapnia: - Likely related to COPD exacerbation - Remains intubated - Pulm following - Concern for sepsis. On vanc/zosyn - Continue Solu-Medrol and nebs  Encephalopathy acute: - Possibly secondary to acute illness vs sedation.  - Recent CT of the head results without acute abnormality. - This AM, pt is easily awakened and gently sedate, following commands and nodding appropriately to answer questions  - White count trending up - sepsis vs steroid induced - Will continue with supportive therapy specifically intubation with Solu-Medrol.Vanc and Zosyn day #3. - Pan cultures pending  Acidosis:  - Respiratory/metabolic mix. - Related to COPD exacerbation and acute illness.  COPD exacerbation: - Per above   Leukocytosis:  - White count mildly elevated from yesterday - Pan cultures pending - May also be related to concurrent high dose steroid use  Tobacco use: will counsel to quit once acute phase of illness passes.   MGUS (monoclonal gammopathy of unknown significance): Chart review indicates recent visit to oncology last month. Currently stable    Code Status: Full Family Communication: Pt in room (indicate person spoken with, relationship, and if by phone, the number) Disposition Plan: Pending   Consultants:  Pulmonary  Antibiotics: vanc 09/19/12>> Zosyn 09/19/12>>  HPI/Subjective: No acute events overnight. Pt remains intubated  Objective: Filed Vitals:   09/21/12 0530 09/21/12 0600 09/21/12 0630 09/21/12 0737  BP: 121/79 121/70 123/81   Pulse: 71 70 72   Temp: 99.3 F (37.4 C) 99.3 F (37.4 C) 99.2 F (37.3 C) 99 F (37.2 C)  TempSrc:    Axillary  Resp: 14 14 14    Height:      Weight:      SpO2: 100% 99% 99%     Intake/Output Summary (Last 24 hours) at 09/21/12 0803 Last data  filed at 09/21/12 0617  Gross per 24 hour  Intake 3548.99 ml  Output   1875 ml  Net 1673.99 ml   Filed Weights   09/19/12 1142 09/20/12 0500 09/21/12 0500  Weight: 44.906 kg (99 lb) 50.1 kg (110 lb 7.2 oz) 53.6 kg (118 lb 2.7 oz)    Exam:   General:  Easily awakened, in nad  Cardiovascular: regular, s1, s2  Respiratory: intubated, B wheezing L>R  Abdomen: soft, nondistended  Musculoskeletal: perfused, no clubbing   Data Reviewed: Basic Metabolic Panel:  Recent Labs Lab 09/19/12 1100 09/20/12 0442 09/21/12 0433  NA 138 142 144  K 4.2 4.1 3.5  CL 108 111 113*  CO2 24 19 22   GLUCOSE 217* 119* 132*  BUN 15 8 15   CREATININE 0.63 0.72 0.64  CALCIUM 8.1* 8.3* 8.3*   Liver Function Tests:  Recent Labs Lab 09/19/12 1100  AST 25  ALT 16  ALKPHOS 90  BILITOT 0.2*  PROT 7.3  ALBUMIN 3.9   No results found for this basename: LIPASE, AMYLASE,  in the last 168 hours No results found for this basename: AMMONIA,  in the last 168 hours CBC:  Recent Labs Lab 09/19/12 1100 09/20/12 0442 09/21/12 0433  WBC 12.2* 13.6* 15.7*  NEUTROABS 6.1  --   --   HGB 13.2 10.9* 10.4*  HCT 39.4 33.1* 31.0*  MCV 94.9 96.8 94.5  PLT PLATELET CLUMPS NOTED ON SMEAR, COUNT APPEARS ADEQUATE 271 260   Cardiac Enzymes: No results found for this basename: CKTOTAL, CKMB, CKMBINDEX, TROPONINI,  in the last 168 hours BNP (  last 3 results) No results found for this basename: PROBNP,  in the last 8760 hours CBG:  Recent Labs Lab 09/20/12 1645 09/20/12 1942 09/20/12 2335 09/21/12 0411 09/21/12 0727  GLUCAP 133* 123* 134* 131* 130*    Recent Results (from the past 240 hour(s))  MRSA PCR SCREENING     Status: None   Collection Time    09/19/12  3:10 PM      Result Value Range Status   MRSA by PCR NEGATIVE  NEGATIVE Final   Comment:            The GeneXpert MRSA Assay (FDA     approved for NASAL specimens     only), is one component of a     comprehensive MRSA colonization      surveillance program. It is not     intended to diagnose MRSA     infection nor to guide or     monitor treatment for     MRSA infections.  CULTURE, BLOOD (ROUTINE X 2)     Status: None   Collection Time    09/20/12  8:27 AM      Result Value Range Status   Specimen Description BLOOD LEFT ARM   Final   Special Requests BOTTLES DRAWN AEROBIC AND ANAEROBIC 8CC   Final   Culture NO GROWTH <24 HRS   Final   Report Status PENDING   Incomplete  CULTURE, BLOOD (ROUTINE X 2)     Status: None   Collection Time    09/20/12  8:27 AM      Result Value Range Status   Specimen Description BLOOD LEFT HAND   Final   Special Requests BOTTLES DRAWN AEROBIC AND ANAEROBIC 8CC   Final   Culture NO GROWTH <24 HRS   Final   Report Status PENDING   Incomplete     Studies: Ct Head Wo Contrast  09/19/2012   CLINICAL DATA:  57 year old female with altered mental status. Respiratory distress.  EXAM: CT HEAD WITHOUT CONTRAST  TECHNIQUE: Contiguous axial images were obtained from the base of the skull through the vertex without intravenous contrast.  COMPARISON:  06/17/2010.  FINDINGS: Visualized paranasal sinuses and mastoids are clear. Visualized orbits and scalp soft tissues are within normal limits. No acute osseous abnormality identified.  Stable and normal cerebral volume. No midline shift, ventriculomegaly, mass effect, evidence of mass lesion, intracranial hemorrhage or evidence of cortically based acute infarction. Gray-white matter differentiation is within normal limits throughout the brain. No suspicious intracranial vascular hyperdensity.  IMPRESSION: Stable and normal noncontrast CT appearance of the brain.   Electronically Signed   By: Augusto Gamble   On: 09/19/2012 13:04   Dg Chest Port 1 View  09/21/2012   *RADIOLOGY REPORT*  Clinical Data: Respiratory failure.  PORTABLE CHEST - 1 VIEW  Comparison: 09/20/2012.  Findings: Endotracheal tube terminates approximately 2 cm above the carina.  Nasogastric tube is  followed into the stomach with the side port in the distal esophagus.  Heart size normal.  Lungs are hyperinflated but clear.  No pneumothorax.  No pleural fluid.  IMPRESSION: Hyperinflation without acute finding.   Original Report Authenticated By: Leanna Battles, M.D.   Dg Chest Port 1 View  09/20/2012   *RADIOLOGY REPORT*  Clinical Data: Respiratory failure.  PORTABLE CHEST - 1 VIEW  Comparison: 09/19/2012.  Findings: Endotracheal tube terminates approximately 2 cm above the carina.  Heart size within normal limits.  Nasogastric tube is followed into the stomach.  Minimal left basilar subsegmental atelectasis or scarring.  Lungs are otherwise clear.  Possible tiny bilateral pleural effusions.  IMPRESSION:  1.  Possible tiny bilateral pleural effusions. 2.  Left basilar subsegmental atelectasis or scarring.   Original Report Authenticated By: Leanna Battles, M.D.   Dg Chest Port 1 View  09/19/2012   *RADIOLOGY REPORT*  Clinical Data: Respiratory distress  PORTABLE CHEST - 1 VIEW  Comparison:  June 30, 2012  Findings:  Endotracheal tube is 1.9 cm above the carina. Nasogastric tube tip is below the diaphragm.  No pneumothorax.  There is underlying emphysema.  There is no edema or consolidation. The heart size is normal.  Pulmonary vascularity reflects underlying emphysema.  No adenopathy.  There is evidence of an old healed fracture of the left posterior lateral tenth rib.  There is a nipple shadow on the left.  IMPRESSION: Tube positions as described.  No pneumothorax.  Underlying emphysema.  No edema or consolidation.   Original Report Authenticated By: Bretta Bang, M.D.    Scheduled Meds: . albuterol  2.5 mg Nebulization Q4H  . antiseptic oral rinse  15 mL Mouth Rinse QID  . chlorhexidine  15 mL Mouth/Throat QID  . heparin  5,000 Units Subcutaneous Q8H  . ipratropium  0.5 mg Nebulization Q4H  . methylPREDNISolone (SOLU-MEDROL) injection  125 mg Intravenous Q6H  . pantoprazole (PROTONIX) IV  40  mg Intravenous Q12H  . piperacillin-tazobactam (ZOSYN)  IV  3.375 g Intravenous Q8H  . vancomycin  1,000 mg Intravenous Q24H   Continuous Infusions: . sodium chloride 1,000 mL (09/21/12 0617)  . sodium chloride 1,000 mL (09/20/12 1826)  . fentaNYL infusion INTRAVENOUS 150 mcg/hr (09/21/12 0617)  . propofol 25 mcg/kg/min (09/21/12 0617)    Principal Problem:   Acute respiratory failure with hypercapnia Active Problems:   Low blood potassium   MGUS (monoclonal gammopathy of unknown significance)   COPD (chronic obstructive pulmonary disease)   Leukocytosis   COPD exacerbation   Encephalopathy acute   Respiratory acidosis   Tobacco use disorder    Time spent:    Connell Bognar K  [facility] Pager [phone]. If 7PM-7AM, please contact night-coverage at [email or web address], password [facility] 09/21/2012, 8:03 AM  LOS: 2 days

## 2012-09-21 NOTE — Progress Notes (Signed)
Subjective: She seems better. She has no new complaints.  Objective: Vital signs in last 24 hours: Temp:  [98.8 F (37.1 C)-100.4 F (38 C)] 99.3 F (37.4 C) (09/04 0800) Pulse Rate:  [67-111] 70 (09/04 0800) Resp:  [12-15] 15 (09/04 0800) BP: (100-135)/(54-81) 135/78 mmHg (09/04 0800) SpO2:  [89 %-100 %] 97 % (09/04 0804) FiO2 (%):  [40 %] 40 % (09/04 0821) Weight:  [53.6 kg (118 lb 2.7 oz)] 53.6 kg (118 lb 2.7 oz) (09/04 0500) Weight change: 8.694 kg (19 lb 2.7 oz) Last BM Date:  (unknown)  Intake/Output from previous day: 09/03 0701 - 09/04 0700 In: 3786.1 [I.V.:3486.1; IV Piggyback:300] Out: 1875 [Urine:1875]  PHYSICAL EXAM General appearance: alert, cooperative and no distress Resp: wheezes bilaterally Cardio: regular rate and rhythm, S1, S2 normal, no murmur, click, rub or gallop GI: soft, non-tender; bowel sounds normal; no masses,  no organomegaly Extremities: extremities normal, atraumatic, no cyanosis or edema  Lab Results:    Basic Metabolic Panel:  Recent Labs  16/10/96 0442 09/21/12 0433  NA 142 144  K 4.1 3.5  CL 111 113*  CO2 19 22  GLUCOSE 119* 132*  BUN 8 15  CREATININE 0.72 0.64  CALCIUM 8.3* 8.3*   Liver Function Tests:  Recent Labs  09/19/12 1100  AST 25  ALT 16  ALKPHOS 90  BILITOT 0.2*  PROT 7.3  ALBUMIN 3.9   No results found for this basename: LIPASE, AMYLASE,  in the last 72 hours No results found for this basename: AMMONIA,  in the last 72 hours CBC:  Recent Labs  09/19/12 1100 09/20/12 0442 09/21/12 0433  WBC 12.2* 13.6* 15.7*  NEUTROABS 6.1  --   --   HGB 13.2 10.9* 10.4*  HCT 39.4 33.1* 31.0*  MCV 94.9 96.8 94.5  PLT PLATELET CLUMPS NOTED ON SMEAR, COUNT APPEARS ADEQUATE 271 260   Cardiac Enzymes: No results found for this basename: CKTOTAL, CKMB, CKMBINDEX, TROPONINI,  in the last 72 hours BNP: No results found for this basename: PROBNP,  in the last 72 hours D-Dimer:  Recent Labs  09/19/12 1816   DDIMER 0.37   CBG:  Recent Labs  09/20/12 1138 09/20/12 1645 09/20/12 1942 09/20/12 2335 09/21/12 0411 09/21/12 0727  GLUCAP 154* 133* 123* 134* 131* 130*   Hemoglobin A1C: No results found for this basename: HGBA1C,  in the last 72 hours Fasting Lipid Panel: No results found for this basename: CHOL, HDL, LDLCALC, TRIG, CHOLHDL, LDLDIRECT,  in the last 72 hours Thyroid Function Tests: No results found for this basename: TSH, T4TOTAL, FREET4, T3FREE, THYROIDAB,  in the last 72 hours Anemia Panel: No results found for this basename: VITAMINB12, FOLATE, FERRITIN, TIBC, IRON, RETICCTPCT,  in the last 72 hours Coagulation: No results found for this basename: LABPROT, INR,  in the last 72 hours Urine Drug Screen: Drugs of Abuse     Component Value Date/Time   LABOPIA NONE DETECTED 09/19/2012 1135   COCAINSCRNUR NONE DETECTED 09/19/2012 1135   LABBENZ NONE DETECTED 09/19/2012 1135   AMPHETMU NONE DETECTED 09/19/2012 1135   THCU NONE DETECTED 09/19/2012 1135   LABBARB NONE DETECTED 09/19/2012 1135    Alcohol Level: No results found for this basename: ETH,  in the last 72 hours Urinalysis:  Recent Labs  09/19/12 1122  COLORURINE YELLOW  LABSPEC 1.020  PHURINE 5.5  GLUCOSEU NEGATIVE  HGBUR SMALL*  BILIRUBINUR NEGATIVE  KETONESUR NEGATIVE  PROTEINUR NEGATIVE  UROBILINOGEN 0.2  NITRITE NEGATIVE  LEUKOCYTESUR NEGATIVE  Misc. Labs:  ABGS  Recent Labs  09/19/12 1745  09/21/12 0456  PHART 7.093*  < > 7.450  PO2ART 130.0*  < > 129.0*  TCO2 18.0  --   --   HCO3 18.2*  < > 22.3  < > = values in this interval not displayed. CULTURES Recent Results (from the past 240 hour(s))  MRSA PCR SCREENING     Status: None   Collection Time    09/19/12  3:10 PM      Result Value Range Status   MRSA by PCR NEGATIVE  NEGATIVE Final   Comment:            The GeneXpert MRSA Assay (FDA     approved for NASAL specimens     only), is one component of a     comprehensive MRSA  colonization     surveillance program. It is not     intended to diagnose MRSA     infection nor to guide or     monitor treatment for     MRSA infections.  CULTURE, BLOOD (ROUTINE X 2)     Status: None   Collection Time    09/20/12  8:27 AM      Result Value Range Status   Specimen Description BLOOD LEFT ARM   Final   Special Requests BOTTLES DRAWN AEROBIC AND ANAEROBIC 8CC   Final   Culture NO GROWTH <24 HRS   Final   Report Status PENDING   Incomplete  CULTURE, BLOOD (ROUTINE X 2)     Status: None   Collection Time    09/20/12  8:27 AM      Result Value Range Status   Specimen Description BLOOD LEFT HAND   Final   Special Requests BOTTLES DRAWN AEROBIC AND ANAEROBIC 8CC   Final   Culture NO GROWTH <24 HRS   Final   Report Status PENDING   Incomplete   Studies/Results: Ct Head Wo Contrast  09/19/2012   CLINICAL DATA:  57 year old female with altered mental status. Respiratory distress.  EXAM: CT HEAD WITHOUT CONTRAST  TECHNIQUE: Contiguous axial images were obtained from the base of the skull through the vertex without intravenous contrast.  COMPARISON:  06/17/2010.  FINDINGS: Visualized paranasal sinuses and mastoids are clear. Visualized orbits and scalp soft tissues are within normal limits. No acute osseous abnormality identified.  Stable and normal cerebral volume. No midline shift, ventriculomegaly, mass effect, evidence of mass lesion, intracranial hemorrhage or evidence of cortically based acute infarction. Gray-white matter differentiation is within normal limits throughout the brain. No suspicious intracranial vascular hyperdensity.  IMPRESSION: Stable and normal noncontrast CT appearance of the brain.   Electronically Signed   By: Augusto Gamble   On: 09/19/2012 13:04   Dg Chest Port 1 View  09/21/2012   *RADIOLOGY REPORT*  Clinical Data: Respiratory failure.  PORTABLE CHEST - 1 VIEW  Comparison: 09/20/2012.  Findings: Endotracheal tube terminates approximately 2 cm above the  carina.  Nasogastric tube is followed into the stomach with the side port in the distal esophagus.  Heart size normal.  Lungs are hyperinflated but clear.  No pneumothorax.  No pleural fluid.  IMPRESSION: Hyperinflation without acute finding.   Original Report Authenticated By: Leanna Battles, M.D.   Dg Chest Port 1 View  09/20/2012   *RADIOLOGY REPORT*  Clinical Data: Respiratory failure.  PORTABLE CHEST - 1 VIEW  Comparison: 09/19/2012.  Findings: Endotracheal tube terminates approximately 2 cm above the carina.  Heart size within  normal limits.  Nasogastric tube is followed into the stomach.  Minimal left basilar subsegmental atelectasis or scarring.  Lungs are otherwise clear.  Possible tiny bilateral pleural effusions.  IMPRESSION:  1.  Possible tiny bilateral pleural effusions. 2.  Left basilar subsegmental atelectasis or scarring.   Original Report Authenticated By: Leanna Battles, M.D.   Dg Chest Port 1 View  09/19/2012   *RADIOLOGY REPORT*  Clinical Data: Respiratory distress  PORTABLE CHEST - 1 VIEW  Comparison:  June 30, 2012  Findings:  Endotracheal tube is 1.9 cm above the carina. Nasogastric tube tip is below the diaphragm.  No pneumothorax.  There is underlying emphysema.  There is no edema or consolidation. The heart size is normal.  Pulmonary vascularity reflects underlying emphysema.  No adenopathy.  There is evidence of an old healed fracture of the left posterior lateral tenth rib.  There is a nipple shadow on the left.  IMPRESSION: Tube positions as described.  No pneumothorax.  Underlying emphysema.  No edema or consolidation.   Original Report Authenticated By: Bretta Bang, M.D.    Medications:  Prior to Admission:  Prescriptions prior to admission  Medication Sig Dispense Refill  . albuterol (ACCUNEB) 1.25 MG/3ML nebulizer solution Take 1 ampule by nebulization every 6 (six) hours as needed. For shortness of breath      . albuterol (PROVENTIL HFA;VENTOLIN HFA) 108 (90 BASE)  MCG/ACT inhaler Inhale 2 puffs into the lungs every 6 (six) hours as needed for wheezing or shortness of breath.      . ALPRAZolam (XANAX) 0.5 MG tablet Take 0.5 mg by mouth at bedtime. For anxiety      . buPROPion (WELLBUTRIN SR) 100 MG 12 hr tablet Take 100 mg by mouth 3 (three) times daily.       . cyclobenzaprine (FLEXERIL) 10 MG tablet Take 10 mg by mouth 3 (three) times daily as needed for muscle spasms.       Marland Kitchen HYDROcodone-acetaminophen (NORCO/VICODIN) 5-325 MG per tablet Take 1 tablet by mouth every 6 (six) hours as needed for pain.      . potassium chloride SA (K-DUR,KLOR-CON) 20 MEQ tablet Take 1 tablet (20 mEq total) by mouth 2 (two) times daily.  30 tablet  0   Scheduled: . albuterol  2.5 mg Nebulization Q4H  . antiseptic oral rinse  15 mL Mouth Rinse QID  . chlorhexidine  15 mL Mouth/Throat QID  . heparin  5,000 Units Subcutaneous Q8H  . ipratropium  0.5 mg Nebulization Q4H  . methylPREDNISolone (SOLU-MEDROL) injection  125 mg Intravenous Q6H  . pantoprazole (PROTONIX) IV  40 mg Intravenous Q12H  . piperacillin-tazobactam (ZOSYN)  IV  3.375 g Intravenous Q8H  . vancomycin  1,000 mg Intravenous Q24H   Continuous: . sodium chloride 1,000 mL (09/21/12 0800)  . sodium chloride 1,000 mL (09/20/12 1826)  . fentaNYL infusion INTRAVENOUS 50 mcg/hr (09/21/12 0800)  . propofol 10 mcg/kg/min (09/21/12 0830)   WUJ:WJXBJY chloride, acetaminophen, fentaNYL, fentaNYL  Assesment: She was admitted with acute respiratory failure with hypercapnia. She also had evidence of metabolic acidemia and that seems to have improved. This morning she looks good and I think she may be able to come off the ventilator Principal Problem:   Acute respiratory failure with hypercapnia Active Problems:   Low blood potassium   MGUS (monoclonal gammopathy of unknown significance)   COPD (chronic obstructive pulmonary disease)   Leukocytosis   COPD exacerbation   Encephalopathy acute   Respiratory acidosis    Tobacco  use disorder    Plan: Attempt weaning today    LOS: 2 days   Jashanti Clinkscale L 09/21/2012, 8:37 AM

## 2012-09-21 NOTE — Procedures (Signed)
Extubation Procedure Note  Patient Details:   Name: Misty Richardson DOB: [birth year] MRN: [id number]   Airway Documentation:  Airway 7 mm (Active)  Secured at (cm) 23 cm 09/21/2012  8:34 AM  Measured From Lips 09/21/2012  8:34 AM  Secured Location Left 09/21/2012  8:21 AM  Secured By [employer] 09/21/2012  8:34 AM  Tube Holder Repositioned Yes 09/21/2012  8:21 AM  Cuff Pressure (cm H2O) 26 cm H2O 09/21/2012  4:31 AM  Site Condition Dry 09/21/2012  8:34 AM    Evaluation  O2 sats: stable throughout Complications: No apparent complications Patient did tolerate procedure well. Bilateral Breath Sounds: Expiratory wheezes Suctioning: Oral;Airway Yes  Cloretta Ned 09/21/2012, 9:56 AM

## 2012-09-21 NOTE — Plan of Care (Signed)
Problem: Phase II Progression Outcomes Goal: Time pt extubated/weaned off vent Outcome: Completed/Met Date Met:  09/21/12 0955 09/21/12

## 2012-09-22 LAB — GLUCOSE, CAPILLARY
Glucose-Capillary: 114 mg/dL — ABNORMAL HIGH (ref 70–99)
Glucose-Capillary: 122 mg/dL — ABNORMAL HIGH (ref 70–99)

## 2012-09-22 LAB — URINE CULTURE
Colony Count: NO GROWTH
Culture: NO GROWTH

## 2012-09-22 LAB — BASIC METABOLIC PANEL
BUN: 18 mg/dL (ref 6–23)
Calcium: 8.8 mg/dL (ref 8.4–10.5)
Creatinine, Ser: 0.56 mg/dL (ref 0.50–1.10)
GFR calc Af Amer: >90 mL/min (ref >90)

## 2012-09-22 MED ORDER — ALPRAZOLAM 0.5 MG PO TABS
0.5000 mg | ORAL_TABLET | Freq: Every day | ORAL | Status: DC
  Administered 2012-09-22: 0.5 mg via ORAL
  Filled 2012-09-22: qty 1

## 2012-09-22 MED ORDER — METHYLPREDNISOLONE SODIUM SUCC 125 MG IJ SOLR
125.0000 mg | Freq: Two times a day (BID) | INTRAMUSCULAR | Status: DC
  Administered 2012-09-23: 125 mg via INTRAVENOUS
  Filled 2012-09-22: qty 2

## 2012-09-22 MED ORDER — ACETAMINOPHEN 325 MG PO TABS
ORAL_TABLET | ORAL | Status: AC
  Filled 2012-09-22: qty 2

## 2012-09-22 MED ORDER — BUPROPION HCL ER (SR) 100 MG PO TB12
100.0000 mg | ORAL_TABLET | Freq: Three times a day (TID) | ORAL | Status: DC
  Administered 2012-09-22: 100 mg via ORAL
  Filled 2012-09-22 (×10): qty 1

## 2012-09-22 MED ORDER — ACETAMINOPHEN 325 MG PO TABS
650.0000 mg | ORAL_TABLET | ORAL | Status: DC | PRN
  Administered 2012-09-22: 650 mg via ORAL

## 2012-09-22 MED ORDER — BUDESONIDE-FORMOTEROL FUMARATE 160-4.5 MCG/ACT IN AERO
2.0000 | INHALATION_SPRAY | Freq: Two times a day (BID) | RESPIRATORY_TRACT | Status: DC
  Administered 2012-09-22 – 2012-09-23 (×3): 2 via RESPIRATORY_TRACT
  Filled 2012-09-22: qty 6

## 2012-09-22 MED ORDER — LORAZEPAM 2 MG/ML IJ SOLN
0.5000 mg | Freq: Once | INTRAMUSCULAR | Status: AC
  Administered 2012-09-22: 0.5 mg via INTRAVENOUS
  Filled 2012-09-22: qty 1

## 2012-09-22 MED ORDER — BIOTENE DRY MOUTH MT LIQD
15.0000 mL | Freq: Two times a day (BID) | OROMUCOSAL | Status: DC
  Administered 2012-09-23: 15 mL via OROMUCOSAL

## 2012-09-22 NOTE — Progress Notes (Signed)
Dr Karilyn Cota paged and made aware that pt is still having nausea after another dose of Zofran. Pt complains of abdominal cramps. Will continue to monitor.

## 2012-09-22 NOTE — Progress Notes (Signed)
Pt to be transferred to room 305 per MD order. Report called to RN. Pt transferred via wheelchair with personal belongings. Pts mother at bedside and aware of transfer.

## 2012-09-22 NOTE — Progress Notes (Signed)
Subjective: She has a number of somatic complaints including headache nausea and anxiety. Her breathing is doing okay she says. She is coughing some.  Objective: Vital signs in last 24 hours: Temp:  [97.6 F (36.4 C)-99.3 F (37.4 C)] 98.1 F (36.7 C) (09/05 0700) Pulse Rate:  [85-110] 87 (09/05 0700) Resp:  [8-21] 12 (09/05 0700) BP: (123-154)/(61-108) 141/77 mmHg (09/05 0700) SpO2:  [93 %-99 %] 98 % (09/05 0700) FiO2 (%):  [40 %] 40 % (09/04 0821) Weight:  [53.3 kg (117 lb 8.1 oz)] 53.3 kg (117 lb 8.1 oz) (09/05 0500) Weight change: -0.3 kg (-10.6 oz) Last BM Date:  (unknown, prior to 9/2)  Intake/Output from previous day: 09/04 0701 - 09/05 0700 In: 9451.2 [I.V.:9101.2; IV Piggyback:350] Out: 1150 [Urine:1150]  PHYSICAL EXAM General appearance: alert, cooperative and mild distress Resp: rhonchi bilaterally Cardio: regular rate and rhythm, S1, S2 normal, no murmur, click, rub or gallop GI: soft, non-tender; bowel sounds normal; no masses,  no organomegaly Extremities: extremities normal, atraumatic, no cyanosis or edema  Lab Results:    Basic Metabolic Panel:  Recent Labs  16/10/96 0433 09/22/12 0510  NA 144 141  K 3.5 3.9  CL 113* 107  CO2 22 26  GLUCOSE 132* 115*  BUN 15 18  CREATININE 0.64 0.56  CALCIUM 8.3* 8.8   Liver Function Tests:  Recent Labs  09/19/12 1100  AST 25  ALT 16  ALKPHOS 90  BILITOT 0.2*  PROT 7.3  ALBUMIN 3.9   No results found for this basename: LIPASE, AMYLASE,  in the last 72 hours No results found for this basename: AMMONIA,  in the last 72 hours CBC:  Recent Labs  09/19/12 1100 09/20/12 0442 09/21/12 0433  WBC 12.2* 13.6* 15.7*  NEUTROABS 6.1  --   --   HGB 13.2 10.9* 10.4*  HCT 39.4 33.1* 31.0*  MCV 94.9 96.8 94.5  PLT PLATELET CLUMPS NOTED ON SMEAR, COUNT APPEARS ADEQUATE 271 260   Cardiac Enzymes: No results found for this basename: CKTOTAL, CKMB, CKMBINDEX, TROPONINI,  in the last 72 hours BNP: No results  found for this basename: PROBNP,  in the last 72 hours D-Dimer:  Recent Labs  09/19/12 1816  DDIMER 0.37   CBG:  Recent Labs  09/21/12 1123 09/21/12 1710 09/21/12 2011 09/21/12 2348 09/22/12 0422 09/22/12 0738  GLUCAP 156* 128* 125* 122* 114* 110*   Hemoglobin A1C: No results found for this basename: HGBA1C,  in the last 72 hours Fasting Lipid Panel: No results found for this basename: CHOL, HDL, LDLCALC, TRIG, CHOLHDL, LDLDIRECT,  in the last 72 hours Thyroid Function Tests: No results found for this basename: TSH, T4TOTAL, FREET4, T3FREE, THYROIDAB,  in the last 72 hours Anemia Panel: No results found for this basename: VITAMINB12, FOLATE, FERRITIN, TIBC, IRON, RETICCTPCT,  in the last 72 hours Coagulation: No results found for this basename: LABPROT, INR,  in the last 72 hours Urine Drug Screen: Drugs of Abuse     Component Value Date/Time   LABOPIA NONE DETECTED 09/19/2012 1135   COCAINSCRNUR NONE DETECTED 09/19/2012 1135   LABBENZ NONE DETECTED 09/19/2012 1135   AMPHETMU NONE DETECTED 09/19/2012 1135   THCU NONE DETECTED 09/19/2012 1135   LABBARB NONE DETECTED 09/19/2012 1135    Alcohol Level: No results found for this basename: ETH,  in the last 72 hours Urinalysis:  Recent Labs  09/19/12 1122  COLORURINE YELLOW  LABSPEC 1.020  PHURINE 5.5  GLUCOSEU NEGATIVE  HGBUR SMALL*  BILIRUBINUR NEGATIVE  KETONESUR NEGATIVE  PROTEINUR NEGATIVE  UROBILINOGEN 0.2  NITRITE NEGATIVE  LEUKOCYTESUR NEGATIVE   Misc. Labs:  ABGS  Recent Labs  09/21/12 0900  PHART 7.332*  PO2ART 144.0*  TCO2 21.6  HCO3 23.1   CULTURES Recent Results (from the past 240 hour(s))  MRSA PCR SCREENING     Status: None   Collection Time    09/19/12  3:10 PM      Result Value Range Status   MRSA by PCR NEGATIVE  NEGATIVE Final   Comment:            The GeneXpert MRSA Assay (FDA     approved for NASAL specimens     only), is one component of a     comprehensive MRSA colonization      surveillance program. It is not     intended to diagnose MRSA     infection nor to guide or     monitor treatment for     MRSA infections.  CULTURE, BLOOD (ROUTINE X 2)     Status: None   Collection Time    09/20/12  8:27 AM      Result Value Range Status   Specimen Description BLOOD LEFT ARM   Final   Special Requests BOTTLES DRAWN AEROBIC AND ANAEROBIC 8CC   Final   Culture NO GROWTH 2 DAYS   Final   Report Status PENDING   Incomplete  CULTURE, BLOOD (ROUTINE X 2)     Status: None   Collection Time    09/20/12  8:27 AM      Result Value Range Status   Specimen Description BLOOD LEFT HAND   Final   Special Requests BOTTLES DRAWN AEROBIC AND ANAEROBIC 8CC   Final   Culture NO GROWTH 2 DAYS   Final   Report Status PENDING   Incomplete  URINE CULTURE     Status: None   Collection Time    09/20/12  7:45 PM      Result Value Range Status   Specimen Description URINE, CATHETERIZED   Final   Special Requests NONE   Final   Culture  Setup Time     Final   Value: 09/20/2012 20:30     Performed at [facility] Count     Final   Value: NO GROWTH     Performed at [facility]   Culture     Final   Value: NO GROWTH     Performed at [facility]   Report Status 09/22/2012 FINAL   Final   Studies/Results: Dg Chest Port 1 View  09/21/2012   *RADIOLOGY REPORT*  Clinical Data: Respiratory failure.  PORTABLE CHEST - 1 VIEW  Comparison: 09/20/2012.  Findings: Endotracheal tube terminates approximately 2 cm above the carina.  Nasogastric tube is followed into the stomach with the side port in the distal esophagus.  Heart size normal.  Lungs are hyperinflated but clear.  No pneumothorax.  No pleural fluid.  IMPRESSION: Hyperinflation without acute finding.   Original Report Authenticated By: Leanna Battles, M.D.    Medications:  Prior to Admission:  Prescriptions prior to admission  Medication Sig Dispense Refill  . albuterol (ACCUNEB) 1.25 MG/3ML nebulizer  solution Take 1 ampule by nebulization every 6 (six) hours as needed. For shortness of breath      . albuterol (PROVENTIL HFA;VENTOLIN HFA) 108 (90 BASE) MCG/ACT inhaler Inhale 2 puffs into the lungs every 6 (six) hours as needed for wheezing  or shortness of breath.      . ALPRAZolam (XANAX) 0.5 MG tablet Take 0.5 mg by mouth at bedtime. For anxiety      . buPROPion (WELLBUTRIN SR) 100 MG 12 hr tablet Take 100 mg by mouth 3 (three) times daily.       . cyclobenzaprine (FLEXERIL) 10 MG tablet Take 10 mg by mouth 3 (three) times daily as needed for muscle spasms.       Marland Kitchen HYDROcodone-acetaminophen (NORCO/VICODIN) 5-325 MG per tablet Take 1 tablet by mouth every 6 (six) hours as needed for pain.      . potassium chloride SA (K-DUR,KLOR-CON) 20 MEQ tablet Take 1 tablet (20 mEq total) by mouth 2 (two) times daily.  30 tablet  0   Scheduled: . albuterol  2.5 mg Nebulization Q4H  . antiseptic oral rinse  15 mL Mouth Rinse QID  . heparin  5,000 Units Subcutaneous Q8H  . ipratropium  0.5 mg Nebulization Q4H  . methylPREDNISolone (SOLU-MEDROL) injection  125 mg Intravenous Q6H  . pantoprazole (PROTONIX) IV  40 mg Intravenous Q12H  . piperacillin-tazobactam (ZOSYN)  IV  3.375 g Intravenous Q8H  . pneumococcal 23 valent vaccine  0.5 mL Intramuscular Tomorrow-1000  . vancomycin  1,000 mg Intravenous Q24H   Continuous: . sodium chloride Stopped (09/21/12 1631)  . sodium chloride 1,000 mL (09/22/12 0600)   ZOX:WRUEAV chloride, acetaminophen, acetaminophen, fentaNYL, ondansetron, ondansetron  Assesment: She was admitted with acute hypercapnic respiratory failure and also had metabolic acidosis. She has pretty severe COPD. She has been extubated yesterday and has done well. Her encephalopathy seems better but she is complaining of headache. Principal Problem:   Acute respiratory failure with hypercapnia Active Problems:   Low blood potassium   MGUS (monoclonal gammopathy of unknown significance)   COPD  (chronic obstructive pulmonary disease)   Leukocytosis   COPD exacerbation   Encephalopathy acute   Respiratory acidosis   Tobacco use disorder    Plan: Continue current treatments. I will be out of town on 09/23/2012 and 09/24/2012 to    LOS: 3 days   Garret Teale L 09/22/2012, 8:06 AM

## 2012-09-22 NOTE — Progress Notes (Signed)
Misty Richardson:[id number] DOB: 07-31-1955 DOA: 09/19/2012 PCP: Cassell Smiles., MD   Subjective: I had seen this patient earlier the morning. This is a late entry. The patient has been extubated and is doing reasonably well. She has had some nausea and headache.           Physical Exam: Blood pressure 139/68, pulse 84, temperature 97.9 F (36.6 C), temperature source Oral, resp. rate 24, height 5\' 3"  (1.6 m), weight 53.3 kg (117 lb 8.1 oz), SpO2 98.00%. She looks chronically sick but not daily and distress. She has bilateral wheezing. R. sounds are present without murmurs or added sounds. She is alert and oriented.   Investigations:  Recent Results (from the past 240 hour(s))  MRSA PCR SCREENING     Status: None   Collection Time    09/19/12  3:10 PM      Result Value Range Status   MRSA by PCR NEGATIVE  NEGATIVE Final   Comment:            The GeneXpert MRSA Assay (FDA     approved for NASAL specimens     only), is one component of a     comprehensive MRSA colonization     surveillance program. It is not     intended to diagnose MRSA     infection nor to guide or     monitor treatment for     MRSA infections.  CULTURE, BLOOD (ROUTINE X 2)     Status: None   Collection Time    09/20/12  8:27 AM      Result Value Range Status   Specimen Description BLOOD LEFT ARM   Final   Special Requests BOTTLES DRAWN AEROBIC AND ANAEROBIC 8CC   Final   Culture NO GROWTH 2 DAYS   Final   Report Status PENDING   Incomplete  CULTURE, BLOOD (ROUTINE X 2)     Status: None   Collection Time    09/20/12  8:27 AM      Result Value Range Status   Specimen Description BLOOD LEFT HAND   Final   Special Requests BOTTLES DRAWN AEROBIC AND ANAEROBIC 8CC   Final   Culture NO GROWTH 2 DAYS   Final   Report Status PENDING   Incomplete  URINE CULTURE     Status: None   Collection Time    09/20/12  7:45 PM      Result Value Range Status   Specimen Description URINE, CATHETERIZED    Final   Special Requests NONE   Final   Culture  Setup Time     Final   Value: 09/20/2012 20:30     Performed at [facility] Count     Final   Value: NO GROWTH     Performed at [facility]   Culture     Final   Value: NO GROWTH     Performed at [facility]   Report Status 09/22/2012 FINAL   Final     Basic Metabolic Panel:  Recent Labs  29/52/84 0433 09/22/12 0510  NA 144 141  K 3.5 3.9  CL 113* 107  CO2 22 26  GLUCOSE 132* 115*  BUN 15 18  CREATININE 0.64 0.56  CALCIUM 8.3* 8.8   Liver Function Tests: No results found for this basename: AST, ALT, ALKPHOS, BILITOT, PROT, ALBUMIN,  in the last 72 hours   CBC:  Recent Labs  09/20/12 0442 09/21/12 0433  WBC 13.6* 15.7*  HGB 10.9* 10.4*  HCT 33.1* 31.0*  MCV 96.8 94.5  PLT 271 260    Dg Chest Port 1 View  09/21/2012   *RADIOLOGY REPORT*  Clinical Data: Respiratory failure.  PORTABLE CHEST - 1 VIEW  Comparison: 09/20/2012.  Findings: Endotracheal tube terminates approximately 2 cm above the carina.  Nasogastric tube is followed into the stomach with the side port in the distal esophagus.  Heart size normal.  Lungs are hyperinflated but clear.  No pneumothorax.  No pleural fluid.  IMPRESSION: Hyperinflation without acute finding.   Original Report Authenticated By: Leanna Battles, M.D.      Medications: I have reviewed the patient's current medications.  Impression: 1. Acute respiratory failure with hypercapnia requiring mechanical ventilation, now extubated. 2. COPD with exacerbation causing #1. 3. Ongoing tobacco abuse. 4. Monoclonal gammopathy of unknown significance, stable.     Plan: 1. Reduce IV steroids. 2. Continue with intravenous antibiotics. 3. Reinstitute Wellbutrin 3 times a day that she takes at home. 4. Patient can move to the medical floor.  Consultants:  Pulmonology, Dr. Juanetta Gosling.   Procedures:  None.   Antibiotics:  Intravenous Zosyn started  September 3 ,2014  Intravenous vancomycin started September 3,2014                  Code Status: Full code.  Family Communication: Discussed plan with patient at the bedside.   Disposition Plan: Home when medically stable.  Time spent: 15 minutes.   LOS: 3 days   Wilson Singer Pager [phone]  09/22/2012, 3:33 PM

## 2012-09-22 NOTE — Progress Notes (Signed)
Nutrition Follow-up  INTERVENTION: Follow for diet advancement, evaluate adequacy of nutrition intake  NUTRITION DIAGNOSIS: Inadequate oral intake related to inability to eat as evidenced by NPO status; ongoing  Goal: Pt to meet >/= 90% of their estimated nutrition needs; not met  Monitor:  Diet advancement, tolerance, meal intake,  I/O's, labs, wt changes  57 y.o. female  Admitting Dx: Acute respiratory failure with hypercapnia  ASSESSMENT: Pt extubated and transferred out of ICU now. Remains NPO currently c/o nausea, abdominal pain. Re-estimated nutrition needs. Will continue to follow nutrition status.  Patient Active Problem List   Diagnosis Date Noted  . Acute respiratory failure with hypercapnia 09/19/2012  . Leukocytosis 09/19/2012  . COPD exacerbation 09/19/2012  . Encephalopathy acute 09/19/2012  . Respiratory acidosis 09/19/2012  . Tobacco use disorder 09/19/2012  . MGUS (monoclonal gammopathy of unknown significance) 09/05/2012  . COPD (chronic obstructive pulmonary disease) 09/05/2012  . Low blood potassium 01/26/2011    Height: Ht Readings from Last 1 Encounters:  09/20/12 5\' 3"  (1.6 m)    Weight status: Wt Readings from Last 1 Encounters:  09/22/12 117 lb 8.1 oz (53.3 kg)  admit wt 09/19/12- was 99#  Ideal Body Weight: 115#  Wt Readings from Last 10 Encounters:  09/22/12 117 lb 8.1 oz (53.3 kg)  09/05/12 99 lb 6.4 oz (45.088 kg)  06/30/12 98 lb (44.453 kg)  05/22/12 98 lb (44.453 kg)  04/18/12 98 lb (44.453 kg)  02/02/12 99 lb 12.8 oz (45.269 kg)  01/19/12 98 lb (44.453 kg)  11/14/11 98 lb (44.453 kg)  08/02/11 98 lb (44.453 kg)  07/25/11 98 lb (44.453 kg)   Usual Body Weight: 98-99#  BMI:  Body mass index is 20.82 kg/(m^2). normal range  Re-Estimated Nutritional Needs: Kcal: 1300-1500 Protein: 60-70 gr Fluid: 1500 ml/day (normal needs)  Skin: No issues noted  Diet Order: NPO  EDUCATION NEEDS: -No education needs identified at this  time   Intake/Output Summary (Last 24 hours) at 09/22/12 1137 Last data filed at 09/22/12 0930  Gross per 24 hour  Intake 9343.75 ml  Output   1525 ml  Net 7818.75 ml  Net since admission (09/19/12) +13.3 liters  Last BM: PTA  Labs:   Recent Labs Lab 09/20/12 0442 09/21/12 0433 09/22/12 0510  NA 142 144 141  K 4.1 3.5 3.9  CL 111 113* 107  CO2 19 22 26   BUN 8 15 18   CREATININE 0.72 0.64 0.56  CALCIUM 8.3* 8.3* 8.8  GLUCOSE 119* 132* 115*    CBG (last 3)   Recent Labs  09/21/12 2348 09/22/12 0422 09/22/12 0738  GLUCAP 122* 114* 110*    Scheduled Meds: . albuterol  2.5 mg Nebulization Q4H  . ALPRAZolam  0.5 mg Oral QHS  . antiseptic oral rinse  15 mL Mouth Rinse QID  . budesonide-formoterol  2 puff Inhalation BID  . buPROPion  100 mg Oral TID  . heparin  5,000 Units Subcutaneous Q8H  . ipratropium  0.5 mg Nebulization Q4H  . methylPREDNISolone (SOLU-MEDROL) injection  125 mg Intravenous Q12H  . pantoprazole (PROTONIX) IV  40 mg Intravenous Q12H  . piperacillin-tazobactam (ZOSYN)  IV  3.375 g Intravenous Q8H  . pneumococcal 23 valent vaccine  0.5 mL Intramuscular Tomorrow-1000  . vancomycin  1,000 mg Intravenous Q24H    Continuous Infusions:    Past Medical History  Diagnosis Date  . COPD (chronic obstructive pulmonary disease)   . MGUS (monoclonal gammopathy of unknown significance) 09/05/2012  . COPD (chronic  obstructive pulmonary disease) 09/05/2012    Past Surgical History  Procedure Laterality Date  . Abdominal hysterectomy    . Appendectomy    . Cesarean section      Royann Shivers MS,RD,LDN,CSG Office: [phone] Pager: [phone]

## 2012-09-23 LAB — VANCOMYCIN, TROUGH: Vancomycin Tr: <5 ug/mL — ABNORMAL LOW (ref 10.0–20.0)

## 2012-09-23 MED ORDER — AZITHROMYCIN 500 MG PO TABS: 500.0000 mg | ORAL_TABLET | Freq: Every day | ORAL | Status: DC

## 2012-09-23 MED ORDER — BUDESONIDE-FORMOTEROL FUMARATE 160-4.5 MCG/ACT IN AERO: 2.0000 | INHALATION_SPRAY | Freq: Two times a day (BID) | RESPIRATORY_TRACT | Status: DC

## 2012-09-23 MED ORDER — PREDNISONE 20 MG PO TABS: ORAL_TABLET | ORAL | Status: DC

## 2012-09-23 NOTE — Discharge Summary (Signed)
Physician Discharge Summary  Misty Richardson ZOX:[id number] DOB: Mar 06, 1955 DOA: 09/19/2012  PCP: Cassell Smiles., MD  Admit date: 09/19/2012 Discharge date: 09/23/2012  Time spent: Greater than 30 minutes  Recommendations for Outpatient Follow-up:  1. Followup with primary care physician in approximately one week to assess the need for home oxygen.   Discharge Diagnoses:  1. Acute respiratory failure with type II failure requiring intubation and mechanical ventilation. Post extubation, did well. 2. COPD exacerbation causing #1. 3. Sepsis picture, no obvious source identified. 4. Monoclonal gammopathy of unknown significance, stable.   Discharge Condition: Stable and improved.  Diet recommendation: Regular.  Filed Weights   09/21/12 0500 09/22/12 0500 09/23/12 0610  Weight: 53.6 kg (118 lb 2.7 oz) 53.3 kg (117 lb 8.1 oz) 48.081 kg (106 lb)    History of present illness:  This 57 year old lady presented to the hospital with symptoms of dyspnea. Please see initial history as outlined below: HPI: Misty Richardson is a 57 y.o. female with a past medical history that includes COPD, MGUS presents to the emergency department via [facility] with the chief complaint of shortness of breath. Information is obtained from the chart and emergency department staff and EMS records as patient is currently intubated and there is no family available. MS reports that he went to the home and found the patient in a tripod position and respiratory distress. There were inhalers close by. She was given nebulizer treatment and transported to the emergency department. Oxygen saturation level 74%. In the emergency department she was given an hour-long nebulizer treatment as well as 125 mg of Solu-Medrol. She became confused and combative and required sedation and intubation. Lab work in the emergency department significant for an ABG yielding pH of 7.19 PCO2 54.3 PCO2 416, WBC 12.2, calcium 8.1. Chest x-ray yields no  edema or consolidation with intubation tube in place. Underlying emphysema. EKG yields sinus tach.  Vital signs in the emergency department significant for a temperature of 95.8 heart rate of 117 and blood pressure 106/56. CT of the head results pending at the time of my examination. [facility] were asked to admit.  Hospital Course:  Patient was admitted and very quickly required intubation and mechanical ventilation. She was put on intravenous steroids and antibiotics. She initially presented with a septic picture, unfortunately the source is not clear. Chest x-ray did not reveal pneumonia and blood cultures are negative. She degrees and he well was extubated. Post extubation she continued to do well and was converted to reduce to steroids and eventually was able to be discharged today with oral steroids and a further course of antibiotics. She feels like she is back to her baseline. She says she does not have home oxygen. She did not wish to have home oxygen at the present time but I have suggested that she goes to her primary care physician to have this assessed. Fortunately, she did quit smoking in July. She is now stable for discharge.  Procedures:  None.   Consultations:  Pulmonology, Dr. Juanetta Gosling.  Discharge Exam: Filed Vitals:   09/23/12 0610  BP: 144/84  Pulse: 73  Temp: 98.2 F (36.8 C)  Resp: 16    General: She looks systemically well. Is not septic or toxic. There is no increased work of breathing. There is no peripheral central cyanosis. Cardiovascular: Heart sounds are present and in sinus rhythm without murmurs or added sounds. Respiratory: Lung fields are entirely clear without any crackles, wheezing or bronchial breathing. She is  alert and orientated.  Discharge Instructions  Discharge Orders   Future Appointments Provider Department Dept Phone   03/05/2013 10:50 AM [facility] [phone]   03/07/2013 2:30 PM Claudia Desanctis  [facility] [phone]   Future Orders Complete By Expires   Diet - low sodium heart healthy  As directed    Increase activity slowly  As directed        Medication List         albuterol 1.25 MG/3ML nebulizer solution  Commonly known as:  ACCUNEB  Take 1 ampule by nebulization every 6 (six) hours as needed. For shortness of breath     albuterol 108 (90 BASE) MCG/ACT inhaler  Commonly known as:  PROVENTIL HFA;VENTOLIN HFA  Inhale 2 puffs into the lungs every 6 (six) hours as needed for wheezing or shortness of breath.     ALPRAZolam 0.5 MG tablet  Commonly known as:  XANAX  Take 0.5 mg by mouth at bedtime. For anxiety     azithromycin 500 MG tablet  Commonly known as:  ZITHROMAX  Take 1 tablet (500 mg total) by mouth daily.     budesonide-formoterol 160-4.5 MCG/ACT inhaler  Commonly known as:  SYMBICORT  Inhale 2 puffs into the lungs 2 (two) times daily.     buPROPion 100 MG 12 hr tablet  Commonly known as:  WELLBUTRIN SR  Take 100 mg by mouth 3 (three) times daily.     cyclobenzaprine 10 MG tablet  Commonly known as:  FLEXERIL  Take 10 mg by mouth 3 (three) times daily as needed for muscle spasms.     HYDROcodone-acetaminophen 5-325 MG per tablet  Commonly known as:  NORCO/VICODIN  Take 1 tablet by mouth every 6 (six) hours as needed for pain.     potassium chloride SA 20 MEQ tablet  Commonly known as:  K-DUR,KLOR-CON  Take 1 tablet (20 mEq total) by mouth 2 (two) times daily.     predniSONE 20 MG tablet  Commonly known as:  DELTASONE  Take 2 tablets daily for 3 days, then 1 tablet daily for 3 days, then half tablet daily for 3 days, then STOP.       Allergies  Allergen Reactions  . Avelox [Moxifloxacin Hcl In Nacl] Rash       Follow-up Information   Follow up with Cassell Smiles., MD. Schedule an appointment as soon as possible for a visit in 1 week. (You need to be assessed for the possibility of requiring home oxygen.)    Specialty:   Internal Medicine   Contact information:   [address] [place] [address] [phone]        The results of significant diagnostics from this hospitalization (including imaging, microbiology, ancillary and laboratory) are listed below for reference.    Significant Diagnostic Studies: Ct Head Wo Contrast  09/19/2012   CLINICAL DATA:  57 year old female with altered mental status. Respiratory distress.  EXAM: CT HEAD WITHOUT CONTRAST  TECHNIQUE: Contiguous axial images were obtained from the base of the skull through the vertex without intravenous contrast.  COMPARISON:  06/17/2010.  FINDINGS: Visualized paranasal sinuses and mastoids are clear. Visualized orbits and scalp soft tissues are within normal limits. No acute osseous abnormality identified.  Stable and normal cerebral volume. No midline shift, ventriculomegaly, mass effect, evidence of mass lesion, intracranial hemorrhage or evidence of cortically based acute infarction. Gray-white matter differentiation is within normal limits throughout the brain. No  suspicious intracranial vascular hyperdensity.  IMPRESSION: Stable and normal noncontrast CT appearance of the brain.   Electronically Signed   By: Augusto Gamble   On: 09/19/2012 13:04   Dg Chest Port 1 View  09/21/2012   *RADIOLOGY REPORT*  Clinical Data: Respiratory failure.  PORTABLE CHEST - 1 VIEW  Comparison: 09/20/2012.  Findings: Endotracheal tube terminates approximately 2 cm above the carina.  Nasogastric tube is followed into the stomach with the side port in the distal esophagus.  Heart size normal.  Lungs are hyperinflated but clear.  No pneumothorax.  No pleural fluid.  IMPRESSION: Hyperinflation without acute finding.   Original Report Authenticated By: Leanna Battles, M.D.   Dg Chest Port 1 View  09/20/2012   *RADIOLOGY REPORT*  Clinical Data: Respiratory failure.  PORTABLE CHEST - 1 VIEW  Comparison: 09/19/2012.  Findings: Endotracheal tube terminates  approximately 2 cm above the carina.  Heart size within normal limits.  Nasogastric tube is followed into the stomach.  Minimal left basilar subsegmental atelectasis or scarring.  Lungs are otherwise clear.  Possible tiny bilateral pleural effusions.  IMPRESSION:  1.  Possible tiny bilateral pleural effusions. 2.  Left basilar subsegmental atelectasis or scarring.   Original Report Authenticated By: Leanna Battles, M.D.   Dg Chest Port 1 View  09/19/2012   *RADIOLOGY REPORT*  Clinical Data: Respiratory distress  PORTABLE CHEST - 1 VIEW  Comparison:  June 30, 2012  Findings:  Endotracheal tube is 1.9 cm above the carina. Nasogastric tube tip is below the diaphragm.  No pneumothorax.  There is underlying emphysema.  There is no edema or consolidation. The heart size is normal.  Pulmonary vascularity reflects underlying emphysema.  No adenopathy.  There is evidence of an old healed fracture of the left posterior lateral tenth rib.  There is a nipple shadow on the left.  IMPRESSION: Tube positions as described.  No pneumothorax.  Underlying emphysema.  No edema or consolidation.   Original Report Authenticated By: Bretta Bang, M.D.    Microbiology: Recent Results (from the past 240 hour(s))  MRSA PCR SCREENING     Status: None   Collection Time    09/19/12  3:10 PM      Result Value Range Status   MRSA by PCR NEGATIVE  NEGATIVE Final   Comment:            The GeneXpert MRSA Assay (FDA     approved for NASAL specimens     only), is one component of a     comprehensive MRSA colonization     surveillance program. It is not     intended to diagnose MRSA     infection nor to guide or     monitor treatment for     MRSA infections.  CULTURE, BLOOD (ROUTINE X 2)     Status: None   Collection Time    09/20/12  8:27 AM      Result Value Range Status   Specimen Description BLOOD LEFT ARM   Final   Special Requests BOTTLES DRAWN AEROBIC AND ANAEROBIC 8CC   Final   Culture NO GROWTH 2 DAYS   Final    Report Status PENDING   Incomplete  CULTURE, BLOOD (ROUTINE X 2)     Status: None   Collection Time    09/20/12  8:27 AM      Result Value Range Status   Specimen Description BLOOD LEFT HAND   Final   Special Requests BOTTLES DRAWN AEROBIC AND  ANAEROBIC 8CC   Final   Culture NO GROWTH 2 DAYS   Final   Report Status PENDING   Incomplete  URINE CULTURE     Status: None   Collection Time    09/20/12  7:45 PM      Result Value Range Status   Specimen Description URINE, CATHETERIZED   Final   Special Requests NONE   Final   Culture  Setup Time     Final   Value: 09/20/2012 20:30     Performed at [facility] Count     Final   Value: NO GROWTH     Performed at [facility]   Culture     Final   Value: NO GROWTH     Performed at [facility]   Report Status 09/22/2012 FINAL   Final     Labs: Basic Metabolic Panel:  Recent Labs Lab 09/19/12 1100 09/20/12 0442 09/21/12 0433 09/22/12 0510  NA 138 142 144 141  K 4.2 4.1 3.5 3.9  CL 108 111 113* 107  CO2 24 19 22 26   GLUCOSE 217* 119* 132* 115*  BUN 15 8 15 18   CREATININE 0.63 0.72 0.64 0.56  CALCIUM 8.1* 8.3* 8.3* 8.8   Liver Function Tests:  Recent Labs Lab 09/19/12 1100  AST 25  ALT 16  ALKPHOS 90  BILITOT 0.2*  PROT 7.3  ALBUMIN 3.9     CBC:  Recent Labs Lab 09/19/12 1100 09/20/12 0442 09/21/12 0433  WBC 12.2* 13.6* 15.7*  NEUTROABS 6.1  --   --   HGB 13.2 10.9* 10.4*  HCT 39.4 33.1* 31.0*  MCV 94.9 96.8 94.5  PLT PLATELET CLUMPS NOTED ON SMEAR, COUNT APPEARS ADEQUATE 271 260    CBG:  Recent Labs Lab 09/21/12 1710 09/21/12 2011 09/21/12 2348 09/22/12 0422 09/22/12 0738  GLUCAP 128* 125* 122* 114* 110*       Signed:  GOSRANI,NIMISH C  [facility] 09/23/2012, 8:52 AM

## 2012-09-23 NOTE — Progress Notes (Signed)
Patient received discharge instructions along with follow up appointments and prescriptions. Patient verbalized understanding of all instructions. Patient was escorted by staff via wheelchair to vehicle. Patient discharged to home in stable condition. 

## 2012-09-25 LAB — CULTURE, BLOOD (ROUTINE X 2): Culture: NO GROWTH

## 2012-09-27 IMAGING — CR DG THORACIC SPINE 3V
3 series · 3 of 3 positions shown · non-contrast
Comparison: None.

CLINICAL DATA: Lumbar pain

THORACIC SPINE - 2 VIEW + SWIMMERS

[view not recorded (1 of 3)]
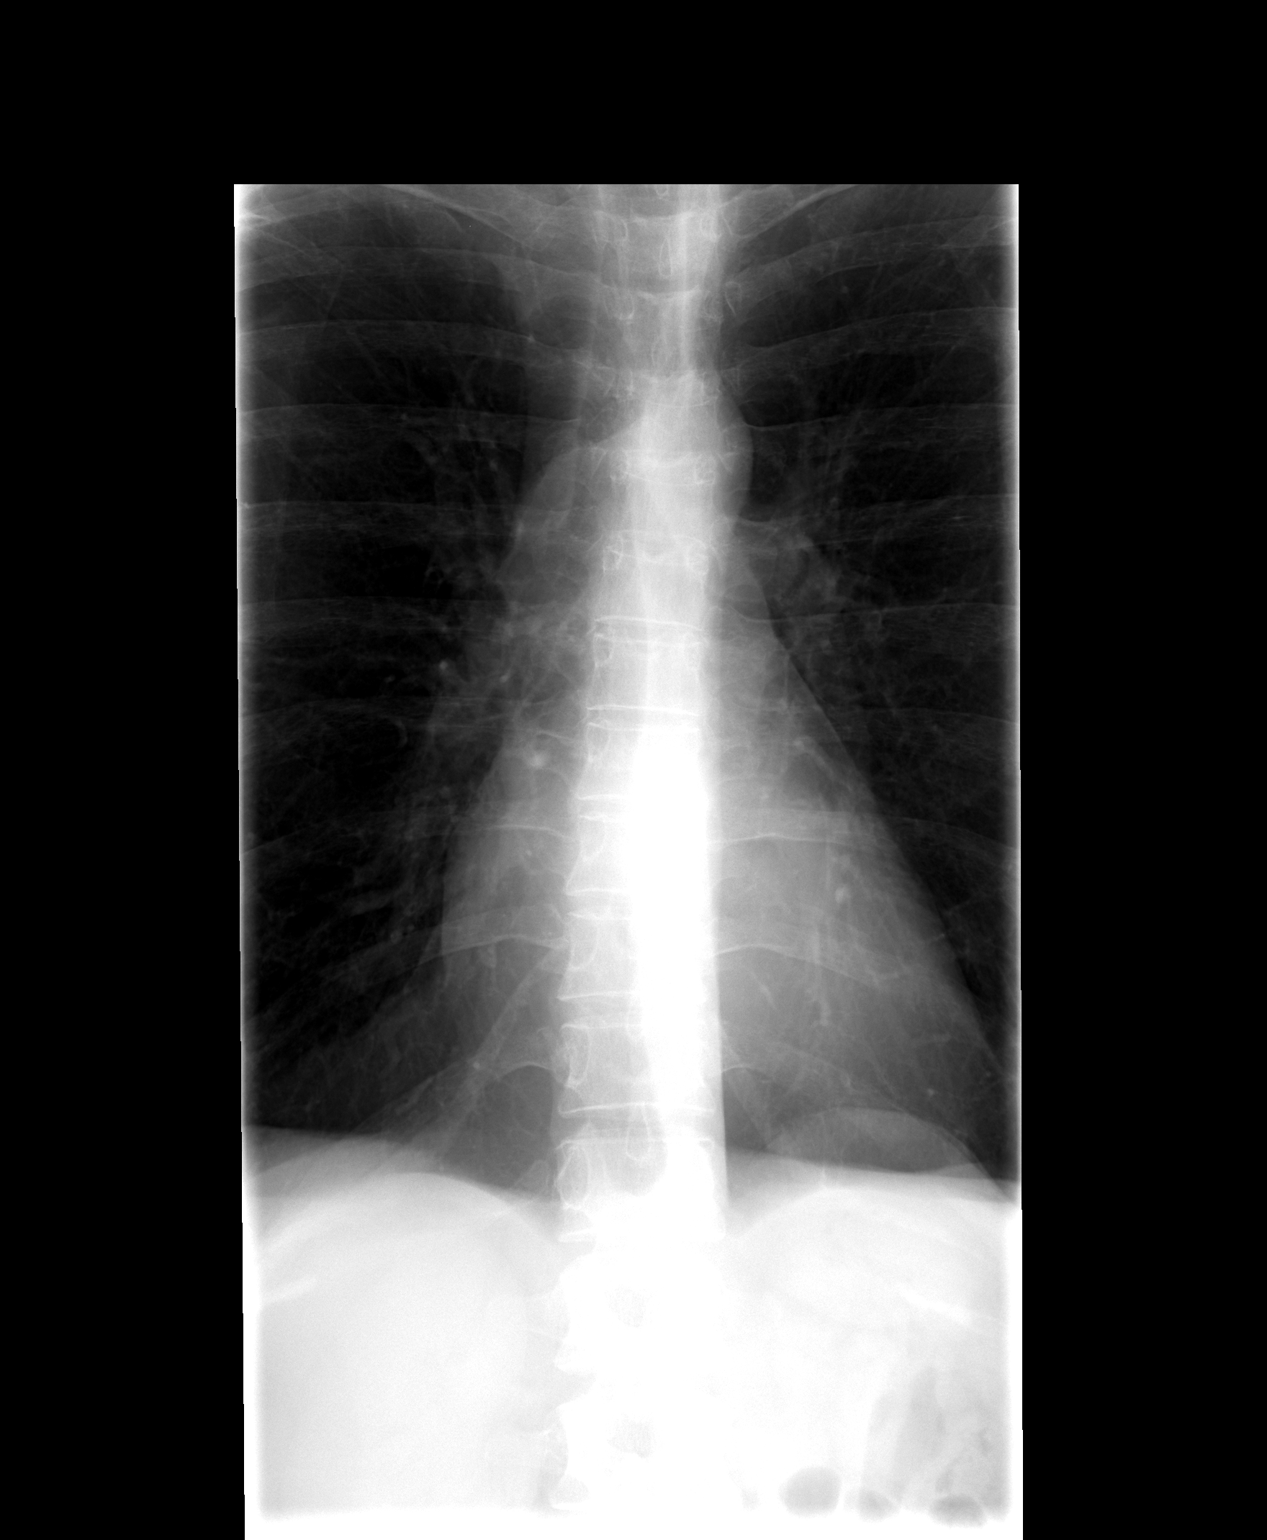

[view not recorded (2 of 3)]
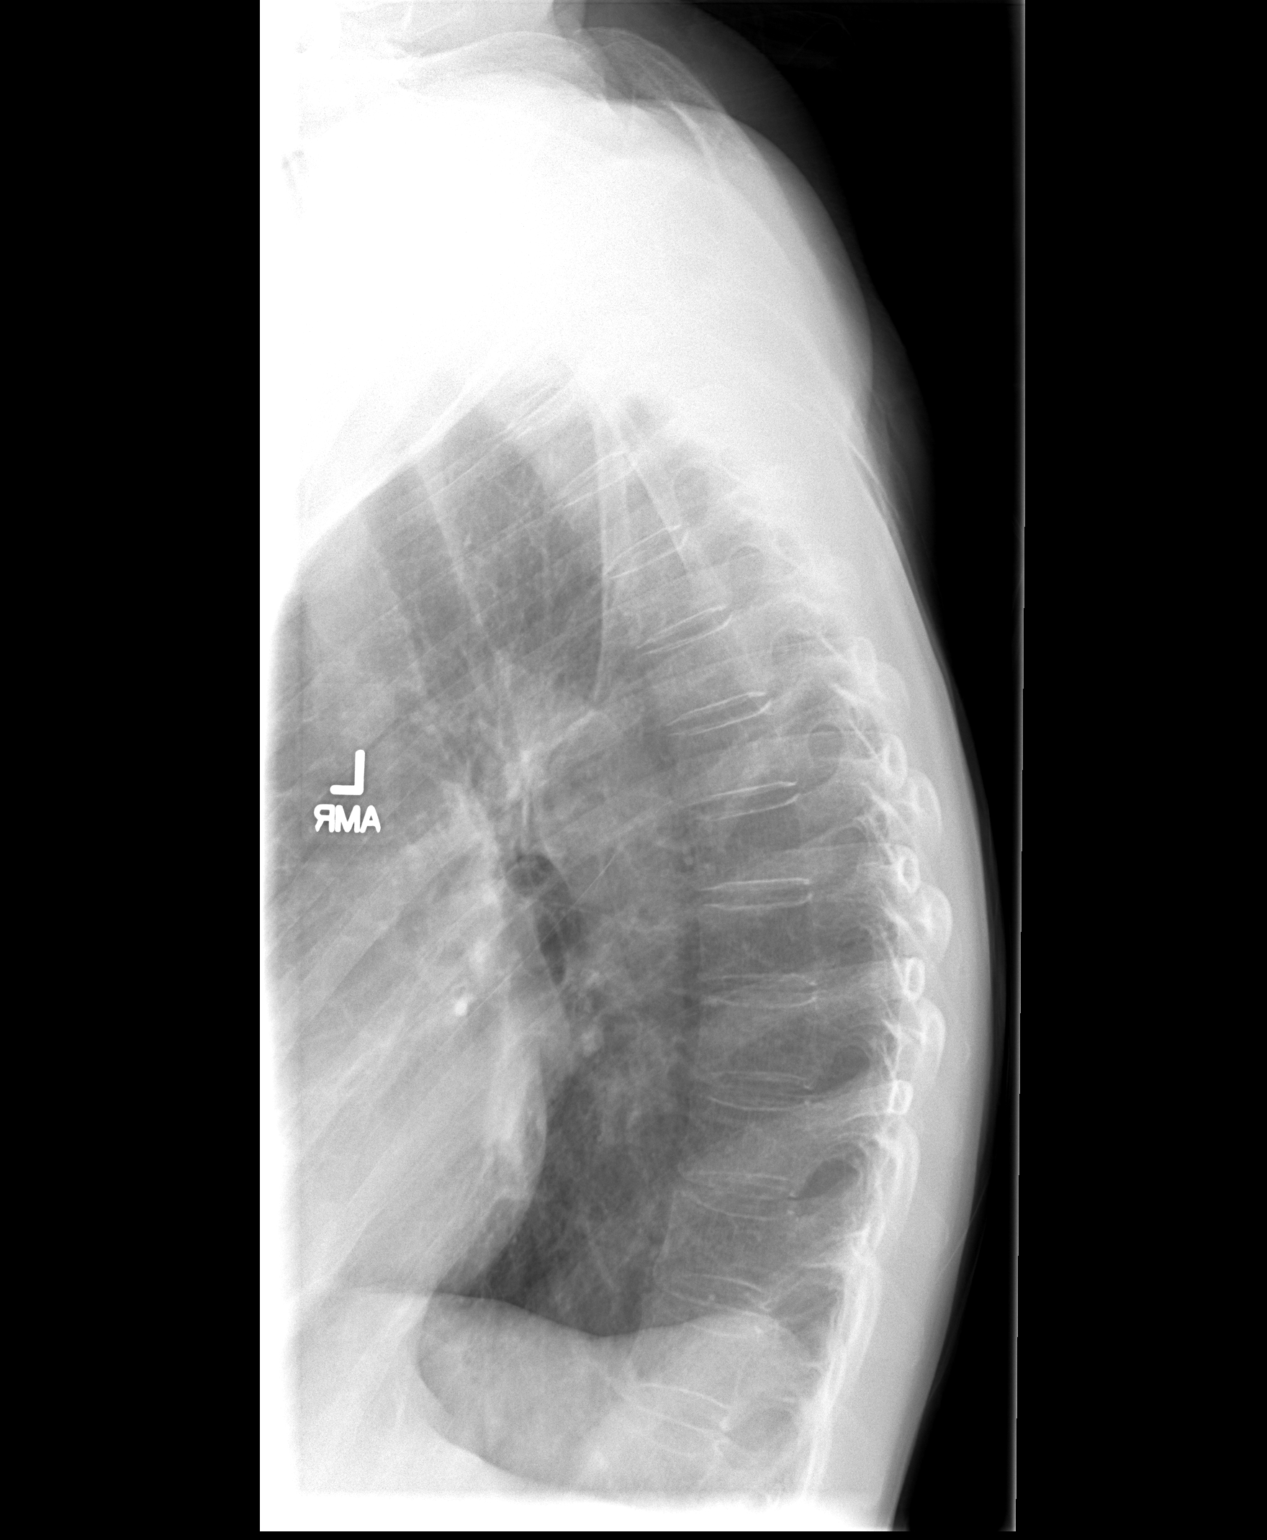

[view not recorded (3 of 3)]
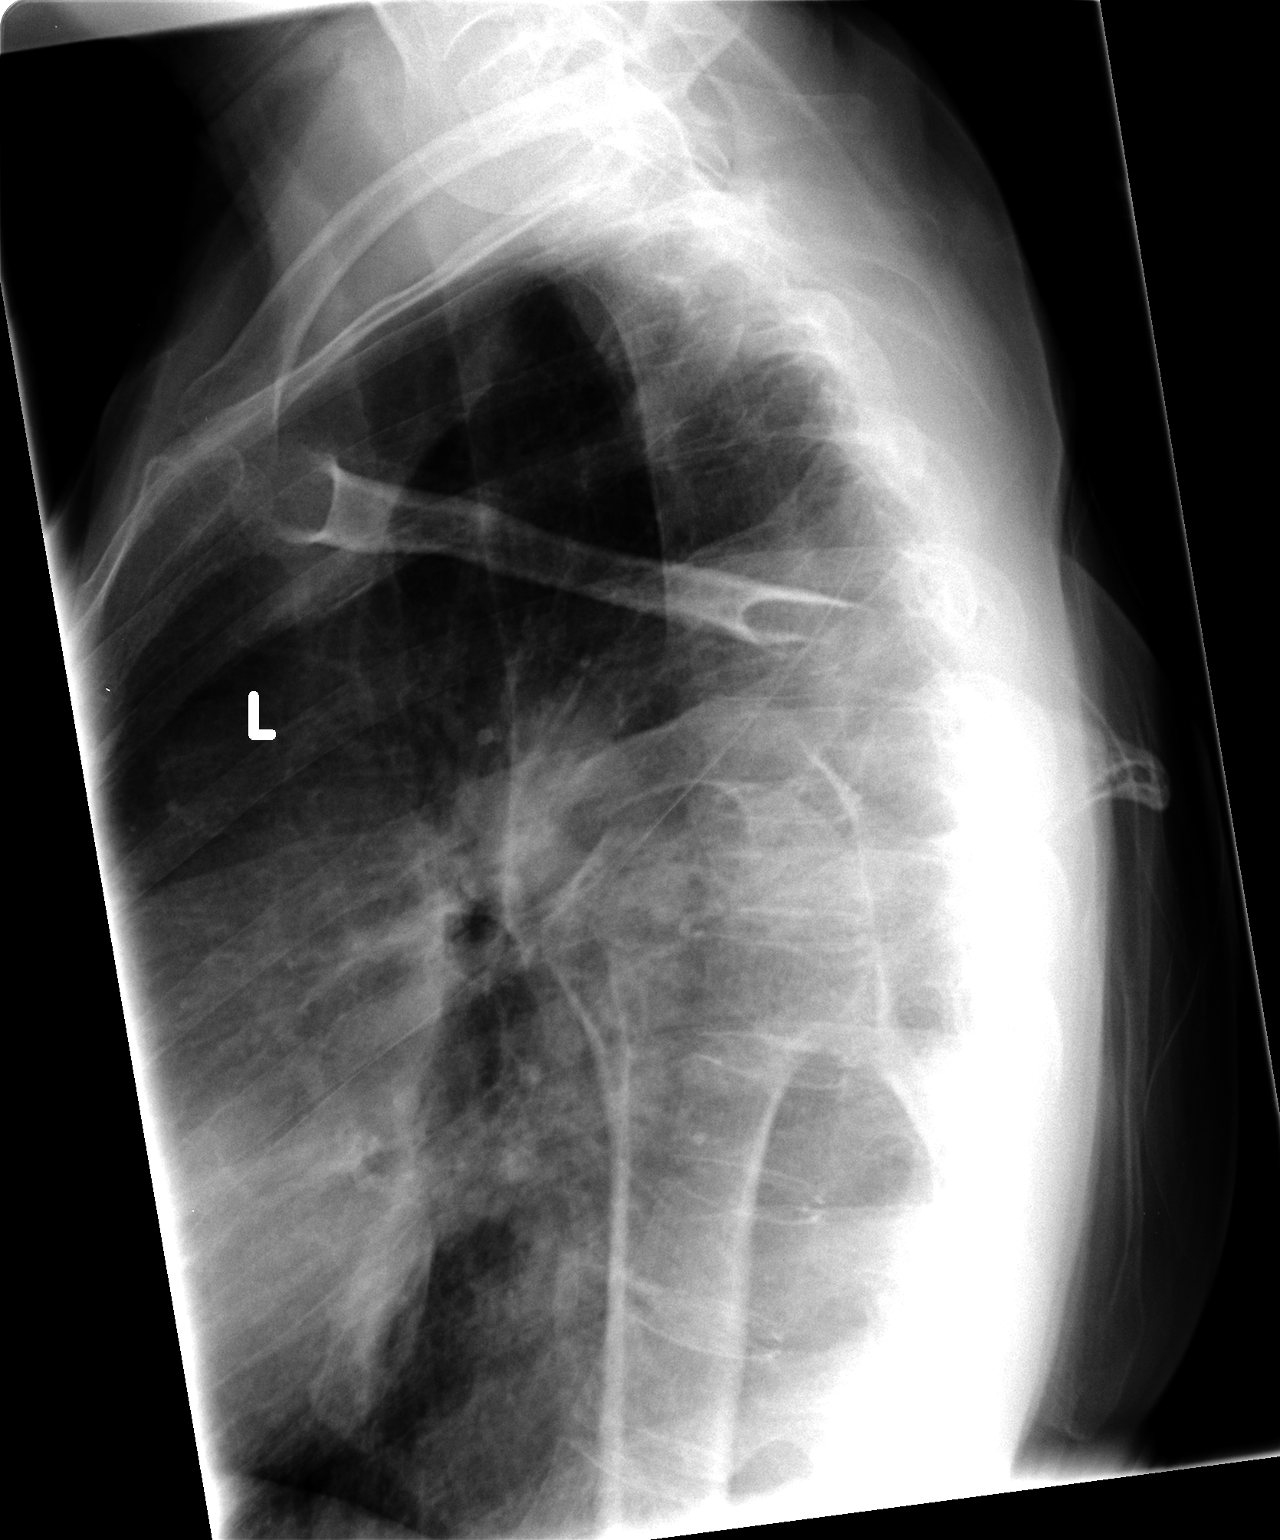

[3 of 3 positions shown; findings below may reference images not displayed]

FINDINGS: Three views of thoracic spine submitted.  No acute
fracture or subluxation.  Minimal lower thoracic dextroscoliosis.
Alignment and vertebral height are preserved.
IMPRESSION: No acute fracture or subluxation.  Minimal lower thoracic
dextroscoliosis.

## 2012-10-02 ENCOUNTER — Encounter: Payer: Self-pay | Admitting: Oncology

## 2012-12-03 ENCOUNTER — Encounter (HOSPITAL_COMMUNITY): Payer: Self-pay | Admitting: Emergency Medicine

## 2012-12-03 ENCOUNTER — Emergency Department (HOSPITAL_COMMUNITY): Payer: Medicare Other

## 2012-12-03 ENCOUNTER — Emergency Department (HOSPITAL_COMMUNITY)
Admission: EM | Admit: 2012-12-03 | Discharge: 2012-12-04 | Disposition: A | Discharge status: Home / Self Care | Payer: Medicare Other | Attending: Emergency Medicine | Admitting: Emergency Medicine

## 2012-12-03 DIAGNOSIS — Z862 Personal history of diseases of the blood and blood-forming organs and certain disorders involving the immune mechanism: Secondary | ICD-10-CM | POA: Insufficient documentation

## 2012-12-03 DIAGNOSIS — R202 Paresthesia of skin: Secondary | ICD-10-CM

## 2012-12-03 DIAGNOSIS — Z87891 Personal history of nicotine dependence: Secondary | ICD-10-CM | POA: Insufficient documentation

## 2012-12-03 DIAGNOSIS — IMO0002 Reserved for concepts with insufficient information to code with codable children: Secondary | ICD-10-CM | POA: Insufficient documentation

## 2012-12-03 DIAGNOSIS — R079 Chest pain, unspecified: Secondary | ICD-10-CM | POA: Insufficient documentation

## 2012-12-03 DIAGNOSIS — R209 Unspecified disturbances of skin sensation: Secondary | ICD-10-CM | POA: Insufficient documentation

## 2012-12-03 DIAGNOSIS — M79609 Pain in unspecified limb: Secondary | ICD-10-CM | POA: Insufficient documentation

## 2012-12-03 DIAGNOSIS — Z79899 Other long term (current) drug therapy: Secondary | ICD-10-CM | POA: Insufficient documentation

## 2012-12-03 DIAGNOSIS — J4489 Other specified chronic obstructive pulmonary disease: Secondary | ICD-10-CM | POA: Insufficient documentation

## 2012-12-03 DIAGNOSIS — Z8639 Personal history of other endocrine, nutritional and metabolic disease: Secondary | ICD-10-CM | POA: Insufficient documentation

## 2012-12-03 DIAGNOSIS — J40 Bronchitis, not specified as acute or chronic: Secondary | ICD-10-CM

## 2012-12-03 DIAGNOSIS — J449 Chronic obstructive pulmonary disease, unspecified: Secondary | ICD-10-CM | POA: Insufficient documentation

## 2012-12-03 LAB — CBC WITH DIFFERENTIAL/PLATELET
Basophils Relative: 1 % (ref 0–1)
HCT: 41.4 % (ref 36.0–46.0)
Hemoglobin: 13.9 g/dL (ref 12.0–15.0)
Lymphs Abs: 1.4 10*3/uL (ref 0.7–4.0)
MCHC: 33.6 g/dL (ref 30.0–36.0)
Monocytes Absolute: 0.4 10*3/uL (ref 0.1–1.0)
Monocytes Relative: 8 % (ref 3–12)
Neutro Abs: 2.8 10*3/uL (ref 1.7–7.7)

## 2012-12-03 MED ORDER — ASPIRIN 81 MG PO CHEW
324.0000 mg | CHEWABLE_TABLET | Freq: Once | ORAL | Status: AC
  Administered 2012-12-03: 324 mg via ORAL
  Filled 2012-12-03: qty 4

## 2012-12-03 NOTE — ED Notes (Signed)
My left arm is hurting from the elbow down and I am having tingling in my left hand. I am also having pain in my chest that comes and goes per pt. I have head congestion per pt.

## 2012-12-03 NOTE — ED Provider Notes (Signed)
CSN: [id number]     Arrival date & time 12/03/12  2245 History   First MD Initiated Contact with Patient 12/03/12 2252     This chart was scribed for Misty Roller, MD by Manuela Schwartz, ED scribe. This patient was seen in room APA08/APA08 and the patient's care was started at 2252.  Chief Complaint  Patient presents with  . Arm Pain  . Chest Pain   Patient is a 57 y.o. female presenting with chest pain. The history is provided by the patient. No language interpreter was used.  Chest Pain Pain location:  L chest Pain quality: dull   Pain radiates to:  Does not radiate Pain radiates to the back: no   Pain severity:  Mild Duration:  3 hours Timing:  Intermittent Progression:  Unchanged Chronicity:  New Context: not breathing, not eating, no stress and no trauma   Relieved by:  Nothing Worsened by:  Nothing tried Ineffective treatments:  None tried Associated symptoms: no abdominal pain, no cough, no dizziness, no fever, no headache, no lower extremity edema, no nausea, no near-syncope, no shortness of breath, no syncope and not vomiting    HPI Comments: Misty Richardson is a 57 y.o. female who presents to the Emergency Department complaining of sudden onset parasthesias from her left elbow to left wrist while at rest on the cough this PM about 3 hours ago along with intermittent, dull left sided chest pain, onset minutes after her left elbow began w/parasthesias, duration of chest pain last in few seconds. She thought her arm felt tingly b/c she had it stuck between her legs while at rest on the couch but decided to go to the ER after her CP began. She denies any similar previous episodes. She states nothing seems to make her CP better or worse, non-exertional. She denies any cardiac hx. She states ambulated normally this PM w/out any difficulty or imbalance and states that walking did not make her CP worse.   She is a smoker and has hx of COPD and asthma.  She states associated flushed/hot  feeling about 1 hour ago. She reports sinus congestion past several days which she took an Catering manager plus for this PM as well as her regular potassium tablet; she also reports lower back pain past several days.  She denies previous CVA, DM, HTN, hyperlipidemia.  She has been coughing increasing over the last several days as well as a productive phlegm. Chest pain does get worse with coughing  Past Medical History  Diagnosis Date  . COPD (chronic obstructive pulmonary disease)   . MGUS (monoclonal gammopathy of unknown significance) 09/05/2012  . COPD (chronic obstructive pulmonary disease) 09/05/2012   Past Surgical History  Procedure Laterality Date  . Abdominal hysterectomy    . Appendectomy    . Cesarean section     Family History  Problem Relation Age of Onset  . Heart failure Father    History  Substance Use Topics  . Smoking status: Former Smoker -- 1.00 packs/day    Types: Cigarettes  . Smokeless tobacco: Former Neurosurgeon    Quit date: 07/31/2012  . Alcohol Use: Yes     Comment: occasionally   OB History   Grav Para Term Preterm Abortions TAB SAB Ect Mult Living   2 2        2      Review of Systems  Constitutional: Negative for fever and chills.  HENT: Negative for congestion and rhinorrhea.   Respiratory: Negative for  cough and shortness of breath.   Cardiovascular: Positive for chest pain. Negative for leg swelling, syncope and near-syncope.  Gastrointestinal: Negative for nausea, vomiting, abdominal pain and diarrhea.  Musculoskeletal: Negative for gait problem.  Skin: Negative for color change and rash.  Neurological: Negative for dizziness, syncope and headaches.       Paraesthesias from left elbow to her left hand.   All other systems reviewed and are negative.   A complete 10 system review of systems was obtained and all systems are negative except as noted in the HPI and PMH.   Allergies  Avelox  Home Medications   Current Outpatient Rx  Name  Route   Sig  Dispense  Refill  . albuterol (ACCUNEB) 1.25 MG/3ML nebulizer solution   Nebulization   Take 1 ampule by nebulization every 6 (six) hours as needed. For shortness of breath         . albuterol (PROVENTIL HFA;VENTOLIN HFA) 108 (90 BASE) MCG/ACT inhaler   Inhalation   Inhale 2 puffs into the lungs every 6 (six) hours as needed for wheezing or shortness of breath.         . ALPRAZolam (XANAX) 0.5 MG tablet   Oral   Take 0.5 mg by mouth at bedtime. For anxiety         . azithromycin (ZITHROMAX Z-PAK) 250 MG tablet   Oral   Take 1 tablet (250 mg total) by mouth daily. 500mg  PO day 1, then 250mg  PO days 205   6 tablet   0   . azithromycin (ZITHROMAX) 500 MG tablet   Oral   Take 1 tablet (500 mg total) by mouth daily.   5 tablet   0   . budesonide-formoterol (SYMBICORT) 160-4.5 MCG/ACT inhaler   Inhalation   Inhale 2 puffs into the lungs 2 (two) times daily.   1 Inhaler   1   . buPROPion (WELLBUTRIN SR) 100 MG 12 hr tablet   Oral   Take 100 mg by mouth 3 (three) times daily.          . cyclobenzaprine (FLEXERIL) 10 MG tablet   Oral   Take 10 mg by mouth 3 (three) times daily as needed for muscle spasms.          Marland Kitchen HYDROcodone-acetaminophen (NORCO/VICODIN) 5-325 MG per tablet   Oral   Take 1 tablet by mouth every 6 (six) hours as needed for pain.         . potassium chloride SA (K-DUR,KLOR-CON) 20 MEQ tablet   Oral   Take 1 tablet (20 mEq total) by mouth 2 (two) times daily.   30 tablet   0   . predniSONE (DELTASONE) 20 MG tablet      Take 2 tablets daily for 3 days, then 1 tablet daily for 3 days, then half tablet daily for 3 days, then STOP.   12 tablet   0   . predniSONE (DELTASONE) 20 MG tablet   Oral   Take 2 tablets (40 mg total) by mouth daily.   10 tablet   0    Triage Vitals: BP 148/81  Pulse 83  Temp(Src) 98.1 F (36.7 C) (Oral)  Resp 20  Ht 5\' 2"  (1.575 m)  Wt 100 lb (45.36 kg)  BMI 18.29 kg/m2  SpO2 100% Physical Exam   Nursing note and vitals reviewed. Constitutional: She is oriented to person, place, and time. She appears well-developed and well-nourished. No distress.  HENT:  Head: Normocephalic and atraumatic.  Eyes: Conjunctivae are normal. Right eye exhibits no discharge. Left eye exhibits no discharge.  Neck: Normal range of motion. No tracheal deviation present.  Cardiovascular: Normal rate, regular rhythm and normal heart sounds.   No murmur heard. Pulmonary/Chest: Effort normal and breath sounds normal. No respiratory distress. She has no wheezes.  Abdominal: Soft. Bowel sounds are normal. She exhibits no distension. There is no tenderness.  Musculoskeletal: Normal range of motion. She exhibits no edema.  No edema of her legs.   Neurological: She is alert and oriented to person, place, and time. She displays normal reflexes. No cranial nerve deficit.  Decreased light touch sensation of left hand fingers 2 through 5.  Motor strength 5/5 throughout all upper extremity joints. Ambulatory without difficulty to exam room.  Reflexes normal.  Cranial nerves 3-12 normal.   Skin: Skin is warm and dry.  Psychiatric: She has a normal mood and affect. Thought content normal.    ED Course  Procedures (including critical care time) DIAGNOSTIC STUDIES: Oxygen Saturation is 100% on room air, normal by my interpretation.    COORDINATION OF CARE: At 90 PM Discussed treatment plan with patient which includes EKG, ASA, blood work, cardiac enzymes. Patient agrees.   Labs Review Labs Reviewed  URINALYSIS, ROUTINE W REFLEX MICROSCOPIC - Abnormal; Notable for the following:    Hgb urine dipstick TRACE (*)    All other components within normal limits  URINE MICROSCOPIC-ADD ON - Abnormal; Notable for the following:    Squamous Epithelial / LPF FEW (*)    All other components within normal limits  CBC WITH DIFFERENTIAL  BASIC METABOLIC PANEL  TROPONIN I   Imaging Review Dg Chest 2 View  12/04/2012    CLINICAL DATA:  COPD, smoker, congestion and shortness of breath.  EXAM: CHEST  2 VIEW  COMPARISON:  Chest radiograph September 21, 2012  FINDINGS: Cardiomediastinal silhouette is nonsuspicious, mildly calcified aorta. Similar pulmonary hyperexpansion with flattening of the hemidiaphragms, no pleural effusions or focal consolidations. Mild biapical pleural parenchymal scarring. Pulmonary vasculature is unremarkable. Trachea projects midline and there is no pneumothorax. Soft tissue planes and included osseous structures are nonsuspicious ; remote left 10th posterior lateral rib fracture.  IMPRESSION: Emphysema without superimposed acute cardiopulmonary process.   Electronically Signed   By: Awilda Metro   On: 12/04/2012 00:24    EKG Interpretation     Ventricular Rate:  76 PR Interval:  136 QRS Duration: 74 QT Interval:  396 QTC Calculation: 445 R Axis:   71 Text Interpretation:  Normal sinus rhythm Septal infarct , age undetermined Abnormal ECG When compared with ECG of 19-Sep-2012 10:27, Vent. rate has decreased BY  38 BPM            MDM   1. Bronchitis   2. Chest pain   3. Paresthesias    At this time the patient is not wheezing, she has no rales and normal oxygenation. Her x-ray does not show any signs of acute infiltrate, her EKG shows no signs of ischemia and her laboratory workup is totally normal. She has also complained of lower back pain but has no red flags for pathologic bacteria and as well as no abnormal urinary findings.  This is likely related to her COPD or chronic bronchitis, antibiotics will be necessary due to the increased productive cough, albuterol which she already has at home but is not taking regularly as well as prednisone. I discussed these findings with the patient including the indications for return if  she has expressed her understanding.  Meds given in ED:  Medications  azithromycin (ZITHROMAX) tablet 500 mg (not administered)  aspirin chewable  tablet 324 mg (324 mg Oral Given 12/03/12 2335)    New Prescriptions   AZITHROMYCIN (ZITHROMAX Z-PAK) 250 MG TABLET    Take 1 tablet (250 mg total) by mouth daily. 500mg  PO day 1, then 250mg  PO days 205   PREDNISONE (DELTASONE) 20 MG TABLET    Take 2 tablets (40 mg total) by mouth daily.     I personally performed the services described in this documentation, which was scribed in my presence. The recorded information has been reviewed and is accurate.       Misty Roller, MD 12/04/12 [phone]

## 2012-12-04 LAB — BASIC METABOLIC PANEL
BUN: 13 mg/dL (ref 6–23)
CO2: 25 mEq/L (ref 19–32)
Chloride: 100 mEq/L (ref 96–112)
Creatinine, Ser: 0.76 mg/dL (ref 0.50–1.10)
Glucose, Bld: 92 mg/dL (ref 70–99)

## 2012-12-04 LAB — URINE MICROSCOPIC-ADD ON

## 2012-12-04 LAB — URINALYSIS, ROUTINE W REFLEX MICROSCOPIC
Bilirubin Urine: NEGATIVE
Glucose, UA: NEGATIVE mg/dL
Ketones, ur: NEGATIVE mg/dL
pH: 7 (ref 5.0–8.0)

## 2012-12-04 MED ORDER — NAPROXEN 250 MG PO TABS
500.0000 mg | ORAL_TABLET | Freq: Once | ORAL | Status: AC
  Administered 2012-12-04: 500 mg via ORAL
  Filled 2012-12-04: qty 2

## 2012-12-04 MED ORDER — PREDNISONE 20 MG PO TABS: 40.0000 mg | ORAL_TABLET | Freq: Every day | ORAL | Status: DC

## 2012-12-04 MED ORDER — AZITHROMYCIN 250 MG PO TABS: 250.0000 mg | ORAL_TABLET | Freq: Every day | ORAL | Status: DC

## 2012-12-04 MED ORDER — AZITHROMYCIN 250 MG PO TABS
500.0000 mg | ORAL_TABLET | Freq: Once | ORAL | Status: AC
  Administered 2012-12-04: 500 mg via ORAL
  Filled 2012-12-04: qty 2

## 2013-02-12 ENCOUNTER — Other Ambulatory Visit (HOSPITAL_COMMUNITY): Payer: Self-pay | Admitting: Family Medicine

## 2013-02-12 ENCOUNTER — Ambulatory Visit (HOSPITAL_COMMUNITY)
Admission: RE | Admit: 2013-02-12 | Discharge: 2013-02-12 | Disposition: A | Discharge status: Home / Self Care | Payer: Medicare Other | Source: Ambulatory Visit | Attending: Family Medicine | Admitting: Family Medicine

## 2013-02-12 DIAGNOSIS — M25559 Pain in unspecified hip: Secondary | ICD-10-CM | POA: Insufficient documentation

## 2013-02-12 DIAGNOSIS — M545 Low back pain, unspecified: Secondary | ICD-10-CM | POA: Insufficient documentation

## 2013-02-12 DIAGNOSIS — M549 Dorsalgia, unspecified: Secondary | ICD-10-CM

## 2013-03-05 ENCOUNTER — Encounter (HOSPITAL_COMMUNITY): Payer: Medicare Other | Attending: Hematology and Oncology

## 2013-03-05 DIAGNOSIS — R109 Unspecified abdominal pain: Secondary | ICD-10-CM | POA: Insufficient documentation

## 2013-03-05 DIAGNOSIS — G8929 Other chronic pain: Secondary | ICD-10-CM | POA: Insufficient documentation

## 2013-03-05 DIAGNOSIS — J449 Chronic obstructive pulmonary disease, unspecified: Secondary | ICD-10-CM | POA: Diagnosis not present

## 2013-03-05 DIAGNOSIS — D472 Monoclonal gammopathy: Secondary | ICD-10-CM | POA: Insufficient documentation

## 2013-03-05 DIAGNOSIS — F17200 Nicotine dependence, unspecified, uncomplicated: Secondary | ICD-10-CM | POA: Insufficient documentation

## 2013-03-05 DIAGNOSIS — Z09 Encounter for follow-up examination after completed treatment for conditions other than malignant neoplasm: Secondary | ICD-10-CM | POA: Insufficient documentation

## 2013-03-05 DIAGNOSIS — J4489 Other specified chronic obstructive pulmonary disease: Secondary | ICD-10-CM | POA: Insufficient documentation

## 2013-03-05 LAB — CBC WITH DIFFERENTIAL/PLATELET
Basophils Absolute: 0.1 10*3/uL (ref 0.0–0.1)
Basophils Relative: 1 % (ref 0–1)
Eosinophils Absolute: 0.2 10*3/uL (ref 0.0–0.7)
Eosinophils Relative: 5 % (ref 0–5)
HEMATOCRIT: 42.4 % (ref 36.0–46.0)
Hemoglobin: 14.5 g/dL (ref 12.0–15.0)
LYMPHS PCT: 45 % (ref 12–46)
Lymphs Abs: 2.3 10*3/uL (ref 0.7–4.0)
MCH: 31.3 pg (ref 26.0–34.0)
MCHC: 34.2 g/dL (ref 30.0–36.0)
MCV: 91.6 fL (ref 78.0–100.0)
MONO ABS: 0.4 10*3/uL (ref 0.1–1.0)
Monocytes Relative: 7 % (ref 3–12)
NEUTROS ABS: 2.1 10*3/uL (ref 1.7–7.7)
Neutrophils Relative %: 41 % — ABNORMAL LOW (ref 43–77)
PLATELETS: 282 10*3/uL (ref 150–400)
RBC: 4.63 MIL/uL (ref 3.87–5.11)
RDW: 14.2 % (ref 11.5–15.5)
WBC: 5 10*3/uL (ref 4.0–10.5)

## 2013-03-05 LAB — COMPREHENSIVE METABOLIC PANEL
ALT: 11 U/L (ref 0–35)
AST: 12 U/L (ref 0–37)
Albumin: 3.7 g/dL (ref 3.5–5.2)
Alkaline Phosphatase: 88 U/L (ref 39–117)
BUN: 13 mg/dL (ref 6–23)
CALCIUM: 9.2 mg/dL (ref 8.4–10.5)
CO2: 28 mEq/L (ref 19–32)
CREATININE: 0.69 mg/dL (ref 0.50–1.10)
Chloride: 104 mEq/L (ref 96–112)
GFR calc Af Amer: >90 mL/min (ref >90)
GFR calc non Af Amer: >90 mL/min (ref >90)
GLUCOSE: 70 mg/dL (ref 70–99)
POTASSIUM: 3.7 meq/L (ref 3.7–5.3)
Sodium: 142 mEq/L (ref 137–147)
TOTAL PROTEIN: 7.4 g/dL (ref 6.0–8.3)
Total Bilirubin: 0.3 mg/dL (ref 0.3–1.2)

## 2013-03-05 NOTE — Progress Notes (Signed)
Labs drawn today for cbc/diff,cmp,kllc,mm,b5mic

## 2013-03-06 LAB — KAPPA/LAMBDA LIGHT CHAINS
KAPPA FREE LGHT CHN: 10.2 mg/dL — AB (ref 0.33–1.94)
Kappa, lambda light chain ratio: 14.17 — ABNORMAL HIGH (ref 0.26–1.65)
Lambda free light chains: 0.72 mg/dL (ref 0.57–2.63)

## 2013-03-06 LAB — BETA 2 MICROGLOBULIN, SERUM: BETA 2 MICROGLOBULIN: 1.59 mg/L (ref 1.01–1.73)

## 2013-03-06 NOTE — Progress Notes (Signed)
   Richardson,Misty J., MD [address] [place] [address]  MGUS (monoclonal gammopathy of unknown significance) - Plan: CBC with Differential, Comprehensive metabolic panel, Lactate dehydrogenase, C-reactive protein, Beta 2 microglobuline, serum, Multiple myeloma panel, serum, Kappa/lambda light chains  CURRENT THERAPY: Observation  INTERVAL HISTORY: Misty Richardson 58 y.o. female returns for  regular  visit for followup of MGUS with 3% to 7% plasma cells.  I personally reviewed and went over laboratory results with the patient.  The results are noted within this dictation.  I personally reviewed and went over radiographic studies with the patient.  The results are noted within this dictation.  Lumbar and hip (right) xrays on 02/12/2013 were negative for any acute abnormalities but also negative for any lytic lesions.   We will monitor for CRAB findings indicative for multiple myeloma transformation. C= Calcium increase of greater than 11.5 mg/dL R = Renal insufficiency with creatinine > 2 mg/dL A= Anemia with Hgb < 10 g/dL B= Bone disease with lytic lesions and/or osteopenia  She denies any B symptoms including fevers, chills, night sweats, unintentional weight loss, early satiety.  She also denies any new lesions or lumps/bumps.  She notes B/L "nodules" but on exam these are her PSIS and not nodules.    She continues to smoke 1/2 ppd.  Smoking cessation education provided.   She is accompanied by her mother and sn, Misty Richardson.   Hematologically, she denies any complaints and ROS questioning is negative.  Past Medical History  Diagnosis Date  . COPD (chronic obstructive pulmonary disease)   . MGUS (monoclonal gammopathy of unknown significance) 09/05/2012  . COPD (chronic obstructive pulmonary disease) 09/05/2012    has MGUS (monoclonal gammopathy of unknown significance); COPD (chronic obstructive pulmonary disease); and Tobacco use disorder on her problem  list.     is allergic to avelox.  [name] had no medications administered during this visit.  Past Surgical History  Procedure Laterality Date  . Abdominal hysterectomy    . Appendectomy    . Cesarean section      Denies any headaches, dizziness, double vision, fevers, chills, night sweats, nausea, vomiting, diarrhea, constipation, chest pain, heart palpitations, shortness of breath, blood in stool, black tarry stool, urinary pain, urinary burning, urinary frequency, hematuria.   PHYSICAL EXAMINATION  ECOG PERFORMANCE STATUS: 0 - Asymptomatic  Filed Vitals:   03/07/13 1425  BP: 103/69  Pulse: 80  Temp: 97.4 F (36.3 C)  Resp: 16    GENERAL:alert, no distress, well nourished, well developed, comfortable, cooperative, smiling and skinny, chronically ill appearing with thickened facial skin, appearing older than stated age. SKIN: skin color, texture, turgor are normal, no rashes or significant lesions HEAD: Normocephalic, No masses, lesions, tenderness or abnormalities EYES: normal, PERRLA, EOMI, Conjunctiva are pink and non-injected EARS: External ears normal OROPHARYNX:mucous membranes are moist  NECK: supple, no adenopathy, thyroid normal size, non-tender, without nodularity, no stridor, non-tender, trachea midline LYMPH:  no palpable lymphadenopathy, no hepatosplenomegaly BREAST:not examined LUNGS: clear to auscultation and percussion HEART: regular rate & rhythm, no murmurs, no gallops, S1 normal and S2 normal ABDOMEN:abdomen soft, non-tender, normal bowel sounds, no masses or organomegaly and no hepatosplenomegaly BACK: Back symmetric, no curvature., No CVA tenderness EXTREMITIES:less then 2 second capillary refill, no joint deformities, effusion, or inflammation, no edema, no skin discoloration, no clubbing, no cyanosis  NEURO: alert & oriented x 3 with fluent speech, no focal motor/sensory deficits, gait normal   LABORATORY DATA: CBC      Component Value  Date/Time   WBC 5.0 03/05/2013 1057   RBC 4.63 03/05/2013 1057   HGB 14.5 03/05/2013 1057   HCT 42.4 03/05/2013 1057   PLT 282 03/05/2013 1057   MCV 91.6 03/05/2013 1057   MCH 31.3 03/05/2013 1057   MCHC 34.2 03/05/2013 1057   RDW 14.2 03/05/2013 1057   LYMPHSABS 2.3 03/05/2013 1057   MONOABS 0.4 03/05/2013 1057   EOSABS 0.2 03/05/2013 1057   BASOSABS 0.1 03/05/2013 1057      Chemistry      Component Value Date/Time   NA 142 03/05/2013 1057   K 3.7 03/05/2013 1057   CL 104 03/05/2013 1057   CO2 28 03/05/2013 1057   BUN 13 03/05/2013 1057   CREATININE 0.69 03/05/2013 1057   CREATININE 0.61 08/29/2008 1556      Component Value Date/Time   CALCIUM 9.2 03/05/2013 1057   ALKPHOS 88 03/05/2013 1057   AST 12 03/05/2013 1057   ALT 11 03/05/2013 1057   BILITOT 0.3 03/05/2013 1057     Results for Misty, Richardson (MRN [id number]) as of 03/06/2013 19:09  Ref. Range 03/05/2013 10:57  Beta-2 Microglobulin Latest Range: 1.01-1.73 mg/L 1.59   Results for Misty, Richardson (MRN [id number]) as of 03/06/2013 19:09  Ref. Range 03/05/2013 10:57  Kappa free light chain Latest Range: 0.33-1.94 mg/dL 10.20 (H)  Lamda free light chains Latest Range: 0.57-2.63 mg/dL 0.72  Kappa, lamda light chain ratio Latest Range: 0.26-1.65  14.17 (H)     Results for Misty, Richardson (MRN [id number]) as of 03/06/2013 19:09  Ref. Range 08/28/2012 11:17  Kappa free light chain Latest Range: 0.33-1.94 mg/dL 17.70 (H)  Lamda free light chains Latest Range: 0.57-2.63 mg/dL 0.81  Kappa, lamda light chain ratio Latest Range: 0.26-1.65  21.85 (H)      ASSESSMENT:  1. MGUS with 3% to 7% plasma cells. 2. COPD still smoking, smoking cessation education provided. 3. Chronic aching pains her abdomen and chest with numerous ER visits in the past  Patient Active Problem List   Diagnosis Date Noted  . Tobacco use disorder 09/19/2012  . MGUS (monoclonal gammopathy of unknown significance) 09/05/2012  . COPD (chronic obstructive pulmonary  disease) 09/05/2012     PLAN:  1. I personally reviewed and went over laboratory results with the patient.  The results are noted within this dictation. 2. I personally reviewed and went over radiographic studies with the patient.  The results are noted within this dictation.  3. Patient education regarding signs and symptoms to be mindful of regarding MGUS/MM. 4. Smoking cessation education provided. 5. Labs in 6 months: CBC diff, CMET, LDH, CRP, MM panel, B2M. 6. Return in 6 months for follow-up.    THERAPY PLAN:  We will observe the patient with lab work and monitor for transformation to multiple myeloma by following the CRAB criteria: C= Calcium increase of greater than 11.5 mg/dL R = Renal insufficiency with creatinine > 2 mg/dL A= Anemia with Hgb < 10 g/dL B= Bone disease with lytic lesions and/or osteopenia   All questions were answered. The patient knows to call the clinic with any problems, questions or concerns. We can certainly see the patient much sooner if necessary.  Patient and plan discussed with Dr. Farrel Gobble and he is in agreement with the aforementioned.   Kavi Almquist

## 2013-03-07 ENCOUNTER — Encounter (HOSPITAL_BASED_OUTPATIENT_CLINIC_OR_DEPARTMENT_OTHER): Payer: Medicare Other | Admitting: Oncology

## 2013-03-07 ENCOUNTER — Encounter (HOSPITAL_COMMUNITY): Payer: Self-pay | Admitting: Oncology

## 2013-03-07 VITALS — BP 103/69 | HR 80 | Temp 97.4°F | Resp 16 | Wt 95.5 lb

## 2013-03-07 DIAGNOSIS — D472 Monoclonal gammopathy: Secondary | ICD-10-CM

## 2013-03-07 LAB — MULTIPLE MYELOMA PANEL, SERUM
ALBUMIN ELP: 58.8 % (ref 55.8–66.1)
ALPHA-1-GLOBULIN: 7.3 % — AB (ref 2.9–4.9)
Alpha-2-Globulin: 9.6 % (ref 7.1–11.8)
BETA GLOBULIN: 4.8 % (ref 4.7–7.2)
Beta 2: 2.6 % — ABNORMAL LOW (ref 3.2–6.5)
Gamma Globulin: 16.9 % (ref 11.1–18.8)
IGA: 37 mg/dL — AB (ref 69–380)
IGG (IMMUNOGLOBIN G), SERUM: 1230 mg/dL (ref 690–1700)
IGM, SERUM: 55 mg/dL — AB (ref 52–322)
M-Spike, %: 0.89 g/dL
TOTAL PROTEIN: 7.2 g/dL (ref 6.0–8.3)

## 2013-03-07 NOTE — Patient Instructions (Addendum)
[facility] Discharge Instructions  RECOMMENDATIONS MADE BY THE CONSULTANT AND ANY TEST RESULTS WILL BE SENT TO YOUR REFERRING PHYSICIAN.  EXAM FINDINGS BY THE PHYSICIAN TODAY AND SIGNS OR SYMPTOMS TO REPORT TO CLINIC OR PRIMARY PHYSICIAN: Exam and findings as discussed by Robynn Pane, PA-C.  Your blood work is stable.  Report night sweats, fevers, unexplained weight loss or other concerns.  MEDICATIONS PRESCRIBED:  none  INSTRUCTIONS/FOLLOW-UP: Lab work and follow-up in 6 months.  Thank you for choosing [facility] to provide your oncology and hematology care.  To afford each patient quality time with our providers, please arrive at least 15 minutes before your scheduled appointment time.  With your help, our goal is to use those 15 minutes to complete the necessary work-up to ensure our physicians have the information they need to help with your evaluation and healthcare recommendations.    Effective January 1st, 2014, we ask that you re-schedule your appointment with our physicians should you arrive 10 or more minutes late for your appointment.  We strive to give you quality time with our providers, and arriving late affects you and other patients whose appointments are after yours.    Again, thank you for choosing [facility].  Our hope is that these requests will decrease the amount of time that you wait before being seen by our physicians.       _____________________________________________________________  Should you have questions after your visit to [facility], please contact our office at [phone] between the hours of 8:30 a.m. and 5:00 p.m.  Voicemails left after 4:30 p.m. will not be returned until the following business day.  For prescription refill requests, have your pharmacy contact our office with your prescription refill request.

## 2013-08-22 ENCOUNTER — Encounter (HOSPITAL_COMMUNITY): Payer: Medicare Other | Attending: Hematology and Oncology

## 2013-08-22 DIAGNOSIS — J4489 Other specified chronic obstructive pulmonary disease: Secondary | ICD-10-CM | POA: Insufficient documentation

## 2013-08-22 DIAGNOSIS — M79609 Pain in unspecified limb: Secondary | ICD-10-CM | POA: Insufficient documentation

## 2013-08-22 DIAGNOSIS — E876 Hypokalemia: Secondary | ICD-10-CM | POA: Diagnosis not present

## 2013-08-22 DIAGNOSIS — J449 Chronic obstructive pulmonary disease, unspecified: Secondary | ICD-10-CM | POA: Insufficient documentation

## 2013-08-22 DIAGNOSIS — G4700 Insomnia, unspecified: Secondary | ICD-10-CM | POA: Diagnosis not present

## 2013-08-22 DIAGNOSIS — F17200 Nicotine dependence, unspecified, uncomplicated: Secondary | ICD-10-CM | POA: Diagnosis not present

## 2013-08-22 DIAGNOSIS — D472 Monoclonal gammopathy: Secondary | ICD-10-CM | POA: Insufficient documentation

## 2013-08-22 DIAGNOSIS — Z888 Allergy status to other drugs, medicaments and biological substances status: Secondary | ICD-10-CM | POA: Diagnosis not present

## 2013-08-22 LAB — COMPREHENSIVE METABOLIC PANEL
ALK PHOS: 92 U/L (ref 39–117)
ALT: 14 U/L (ref 0–35)
ANION GAP: 12 (ref 5–15)
AST: 21 U/L (ref 0–37)
Albumin: 4.1 g/dL (ref 3.5–5.2)
BUN: 16 mg/dL (ref 6–23)
CHLORIDE: 104 meq/L (ref 96–112)
CO2: 23 meq/L (ref 19–32)
Calcium: 9.2 mg/dL (ref 8.4–10.5)
Creatinine, Ser: 0.67 mg/dL (ref 0.50–1.10)
GFR calc non Af Amer: >90 mL/min (ref >90)
GLUCOSE: 93 mg/dL (ref 70–99)
POTASSIUM: 3.6 meq/L — AB (ref 3.7–5.3)
Sodium: 139 mEq/L (ref 137–147)
Total Protein: 7.8 g/dL (ref 6.0–8.3)

## 2013-08-22 LAB — CBC WITH DIFFERENTIAL/PLATELET
Basophils Absolute: 0.1 10*3/uL (ref 0.0–0.1)
Basophils Relative: 1 % (ref 0–1)
Eosinophils Absolute: 0.6 10*3/uL (ref 0.0–0.7)
Eosinophils Relative: 7 % — ABNORMAL HIGH (ref 0–5)
HCT: 41.2 % (ref 36.0–46.0)
HEMOGLOBIN: 14.1 g/dL (ref 12.0–15.0)
LYMPHS ABS: 2.4 10*3/uL (ref 0.7–4.0)
Lymphocytes Relative: 26 % (ref 12–46)
MCH: 31.8 pg (ref 26.0–34.0)
MCHC: 34.2 g/dL (ref 30.0–36.0)
MCV: 92.8 fL (ref 78.0–100.0)
MONOS PCT: 6 % (ref 3–12)
Monocytes Absolute: 0.6 10*3/uL (ref 0.1–1.0)
NEUTROS PCT: 60 % (ref 43–77)
Neutro Abs: 5.6 10*3/uL (ref 1.7–7.7)
Platelets: 294 10*3/uL (ref 150–400)
RBC: 4.44 MIL/uL (ref 3.87–5.11)
RDW: 13.9 % (ref 11.5–15.5)
WBC: 9.2 10*3/uL (ref 4.0–10.5)

## 2013-08-22 LAB — C-REACTIVE PROTEIN: CRP: <0.5 mg/dL — ABNORMAL LOW (ref <0.60)

## 2013-08-22 LAB — LACTATE DEHYDROGENASE: LDH: 113 U/L (ref 94–250)

## 2013-08-22 NOTE — Progress Notes (Signed)
LABS DRAWN FOR CBCD,CMP,LDH,CRPP,B2M,MM,KLLC.

## 2013-08-23 ENCOUNTER — Ambulatory Visit (HOSPITAL_COMMUNITY): Payer: Medicare Other | Admitting: Oncology

## 2013-08-23 LAB — KAPPA/LAMBDA LIGHT CHAINS
KAPPA FREE LGHT CHN: 15.9 mg/dL — AB (ref 0.33–1.94)
Kappa, lambda light chain ratio: 18.93 — ABNORMAL HIGH (ref 0.26–1.65)
LAMDA FREE LIGHT CHAINS: 0.84 mg/dL (ref 0.57–2.63)

## 2013-08-24 LAB — MULTIPLE MYELOMA PANEL, SERUM
ALPHA-2-GLOBULIN: 9.2 % (ref 7.1–11.8)
Albumin ELP: 60.6 % (ref 55.8–66.1)
Alpha-1-Globulin: 4.2 % (ref 2.9–4.9)
BETA GLOBULIN: 4.9 % (ref 4.7–7.2)
Beta 2: 2.6 % — ABNORMAL LOW (ref 3.2–6.5)
GAMMA GLOBULIN: 18.5 % (ref 11.1–18.8)
IGA: 33 mg/dL — AB (ref 69–380)
IgG (Immunoglobin G), Serum: 1510 mg/dL (ref 690–1700)
IgM, Serum: 49 mg/dL — ABNORMAL LOW (ref 52–322)
M-Spike, %: 1.1 g/dL
TOTAL PROTEIN: 7.5 g/dL (ref 6.0–8.3)

## 2013-08-24 LAB — BETA 2 MICROGLOBULIN, SERUM: Beta-2 Microglobulin: 2.25 mg/L — ABNORMAL HIGH (ref <=2.51)

## 2013-08-29 ENCOUNTER — Ambulatory Visit (HOSPITAL_COMMUNITY): Payer: Medicare Other | Admitting: Oncology

## 2013-09-01 NOTE — Progress Notes (Signed)
Misty Richardson., MD [place] [address]  MGUS (monoclonal gammopathy of unknown significance) - Plan: CBC with Differential, Comprehensive metabolic panel, Lactate dehydrogenase, Sedimentation rate, Beta 2 microglobuline, serum, C-reactive protein, Multiple myeloma panel, serum, Kappa/lambda light chains  CURRENT THERAPY: Observation  INTERVAL HISTORY: Misty Richardson 58 y.o. female returns for  regular  visit for followup of MGUS with 3% to 7% plasma cells.  I personally reviewed and went over laboratory results with the patient.  The results are noted within this dictation.  M-spike is noted to be 1.10 compared to 0.89 and 1.12 on 03/05/13 and 08/28/2012 respectively.  Elevated kappa free light chain and kappa:lambda ratio are noted, but stable over at least 3 years.   We will monitor for CRAB findings indicative for multiple myeloma transformation.   C= Calcium increase of greater than 11.5 mg/dL   R = Renal insufficiency with creatinine > 2 mg/dL   A= Anemia with Hgb < 10 g/dL   B= Bone disease with lytic lesions and/or osteopenia  She reports that she has been experiencing insomnia and she notes that she has been out of her Xanax which she used to help her sleep for 3 weeks.  I have deferred this refill to her primary care provider.  She has an appointment this coming Thursday.  She also notes B/L LE aching.  She has strong B/L pulses in the popliteal, ankle and pedal areas.  This may be from restless leg syndrome potentially.  No indication of thrombosis.  Hematologically, she denies any complaints and ROS questioning is negative.  Past Medical History  Diagnosis Date  . COPD (chronic obstructive pulmonary disease)   . MGUS (monoclonal gammopathy of unknown significance) 09/05/2012  . COPD (chronic obstructive pulmonary disease) 09/05/2012    has MGUS (monoclonal gammopathy of unknown significance); COPD (chronic obstructive pulmonary disease); and  Tobacco use disorder on her problem list.     is allergic to avelox.  Misty Richardson had no medications administered during this visit.  Past Surgical History  Procedure Laterality Date  . Abdominal hysterectomy    . Appendectomy    . Cesarean section      Denies any headaches, dizziness, double vision, fevers, chills, night sweats, nausea, vomiting, diarrhea, constipation, chest pain, heart palpitations, shortness of breath, blood in stool, black tarry stool, urinary pain, urinary burning, urinary frequency, hematuria.   PHYSICAL EXAMINATION  ECOG PERFORMANCE STATUS: 0 - Asymptomatic  Filed Vitals:   09/03/13 1529  BP: 89/60  Pulse: 73  Temp: 98 F (36.7 C)  Resp: 16    GENERAL:alert, no distress, well nourished, well developed, comfortable, cooperative and smiling SKIN: skin color, texture, turgor are normal, no rashes or significant lesions HEAD: Normocephalic, No masses, lesions, tenderness or abnormalities EYES: normal, PERRLA, EOMI, Conjunctiva are pink and non-injected EARS: External ears normal OROPHARYNX:lips, buccal mucosa, and tongue normal and mucous membranes are moist  NECK: supple, no adenopathy, thyroid normal size, non-tender, without nodularity, no stridor, non-tender, trachea midline LYMPH:  no palpable lymphadenopathy BREAST:not examined LUNGS: clear to auscultation  HEART: regular rate & rhythm, no murmurs, no gallops, S1 normal and S2 normal ABDOMEN:abdomen soft, non-tender and normal bowel sounds BACK: Back symmetric, no curvature. EXTREMITIES:less then 2 second capillary refill, no joint deformities, effusion, or inflammation, no edema, no skin discoloration, no clubbing, no cyanosis  NEURO: alert & oriented x 3 with fluent speech, no focal motor/sensory deficits, gait normal   LABORATORY DATA: CBC  Component Value Date/Time   WBC 9.2 08/22/2013 0909   RBC 4.44 08/22/2013 0909   HGB 14.1 08/22/2013 0909   HCT 41.2 08/22/2013 0909   PLT 294 08/22/2013  0909   MCV 92.8 08/22/2013 0909   MCH 31.8 08/22/2013 0909   MCHC 34.2 08/22/2013 0909   RDW 13.9 08/22/2013 0909   LYMPHSABS 2.4 08/22/2013 0909   MONOABS 0.6 08/22/2013 0909   EOSABS 0.6 08/22/2013 0909   BASOSABS 0.1 08/22/2013 0909      Chemistry      Component Value Date/Time   NA 139 08/22/2013 0909   K 3.6* 08/22/2013 0909   CL 104 08/22/2013 0909   CO2 23 08/22/2013 0909   BUN 16 08/22/2013 0909   CREATININE 0.67 08/22/2013 0909   CREATININE 0.61 08/29/2008 1556      Component Value Date/Time   CALCIUM 9.2 08/22/2013 0909   ALKPHOS 92 08/22/2013 0909   AST 21 08/22/2013 0909   ALT 14 08/22/2013 0909   BILITOT <0.2* 08/22/2013 0909     Results for Misty, Richardson (MRN [id number]) as of 09/01/2013 11:44  Ref. Range 08/22/2013 09:09  LDH Latest Range: 94-250 U/L 113   Results for Misty, Richardson (MRN [id number]) as of 09/01/2013 11:44  Ref. Range 08/22/2013 09:09  Beta-2 Microglobulin Latest Range: <=2.51 mg/L 2.25 (H)  CRP Latest Range: <0.60 mg/dL <0.5 (L)    Results for Misty, Richardson (MRN [id number]) as of 09/01/2013 11:44  Ref. Range 08/22/2013 09:09 08/22/2013 09:09  Albumin ELP Latest Range: 55.8-66.1 %  60.6  Alpha-1-Globulin Latest Range: 2.9-4.9 %  4.2  Alpha-2-Globulin Latest Range: 7.1-11.8 %  9.2  Beta Globulin Latest Range: 4.7-7.2 %  4.9  Beta 2 Latest Range: 3.2-6.5 %  2.6 (L)  Gamma Globulin Latest Range: 11.1-18.8 %  18.5  M-SPIKE, % No range found  1.10  SPE Interp. No range found  (NOTE)  Comment No range found  (NOTE)  IgG (Immunoglobin G), Serum Latest Range: 563-310-9511 mg/dL  1510  IgA Latest Range: 69-380 mg/dL  33 (L)  IgM, Serum Latest Range: 52-322 mg/dL  49 (L)  Kappa free light chain Latest Range: 0.33-1.94 mg/dL 15.90 (H)   Lamda free light chains Latest Range: 0.57-2.63 mg/dL 0.84   Kappa, lamda light chain ratio Latest Range: 0.26-1.65  18.93 (H)     ASSESSMENT:  1. MGUS with 3% to 7% plasma cells.  2. COPD still smoking, smoking cessation education provided. 3.  Insomnia, followed by primary care provider 4. B/L LE aches, ?restless leg syndrome 5. Hypokalemia  Patient Active Problem List   Diagnosis Date Noted  . Tobacco use disorder 09/19/2012  . MGUS (monoclonal gammopathy of unknown significance) 09/05/2012  . COPD (chronic obstructive pulmonary disease) 09/05/2012    PLAN:  1. I personally reviewed and went over laboratory results with the patient.  The results are noted within this dictation. 2. Smoking cessation education provided. 3. Patient education regarding signs and symptoms to be mindful of regarding MGUS/MM. 4. Labs in 6 months: CBC diff, CMET, LDH, CRP, MM panel, B2M. 5. Follow-up with primary care provider regarding insomnia 6. Recommend increasing foods rich in potassium 7. Return in 6 months for follow-up.   THERAPY PLAN:  We will observe the patient with lab work and monitor for transformation to multiple myeloma by following the CRAB criteria:  C= Calcium increase of greater than 11.5 mg/dL  R = Renal insufficiency with creatinine > 2 mg/dL  A= Anemia with Hgb <  10 g/dL  B= Bone disease with lytic lesions and/or osteopenia   All questions were answered. The patient knows to call the clinic with any problems, questions or concerns. We can certainly see the patient much sooner if necessary.  Patient and plan discussed with Dr. Farrel Gobble and he is in agreement with the aforementioned.   Misty Richardson 09/03/2013

## 2013-09-03 ENCOUNTER — Encounter (HOSPITAL_COMMUNITY): Payer: Self-pay | Admitting: Oncology

## 2013-09-03 ENCOUNTER — Encounter (HOSPITAL_BASED_OUTPATIENT_CLINIC_OR_DEPARTMENT_OTHER): Payer: Medicare Other | Admitting: Oncology

## 2013-09-03 VITALS — BP 89/60 | HR 73 | Temp 98.0°F | Resp 16 | Wt 90.7 lb

## 2013-09-03 DIAGNOSIS — D472 Monoclonal gammopathy: Secondary | ICD-10-CM

## 2013-09-03 NOTE — Patient Instructions (Addendum)
.[facility] Discharge Instructions  RECOMMENDATIONS MADE BY THE CONSULTANT AND ANY TEST RESULTS WILL BE SENT TO YOUR REFERRING PHYSICIAN.  EXAM FINDINGS BY THE PHYSICIAN TODAY AND SIGNS OR SYMPTOMS TO REPORT TO CLINIC OR PRIMARY PHYSICIAN: Exam and findings as discussed by Kirby Crigler PA.   INSTRUCTIONS/FOLLOW-UP: Labs and return visit in 6 months  Thank you for choosing [facility] to provide your oncology and hematology care.  To afford each patient quality time with our providers, please arrive at least 15 minutes before your scheduled appointment time.  With your help, our goal is to use those 15 minutes to complete the necessary work-up to ensure our physicians have the information they need to help with your evaluation and healthcare recommendations.    Effective January 1st, 2014, we ask that you re-schedule your appointment with our physicians should you arrive 10 or more minutes late for your appointment.  We strive to give you quality time with our providers, and arriving late affects you and other patients whose appointments are after yours.    Again, thank you for choosing [facility].  Our hope is that these requests will decrease the amount of time that you wait before being seen by our physicians.       _____________________________________________________________  Should you have questions after your visit to [facility] please contact our office at [phone] between the hours of 8:30 a.m. and 4:30 p.m.  Voicemails left after 4:30 p.m. will not be returned until the following business day.  For prescription refill requests, have your pharmacy contact our office with your prescription refill request.    _______________________________________________________________  We hope that we have given you very good care.  You may receive a patient satisfaction survey in the mail, please complete it and return it as soon  as possible.  We value your feedback!  _______________________________________________________________  Have you asked about our STAR program?  STAR stands for Survivorship Training and Rehabilitation, and this is a nationally recognized cancer care program that focuses on survivorship and rehabilitation.  Cancer and cancer treatments may cause problems, such as, pain, making you feel tired and keeping you from doing the things that you need or want to do. Cancer rehabilitation can help. Our goal is to reduce these troubling effects and help you have the best quality of life possible.  You may receive a survey from a nurse that asks questions about your current state of health.  Based on the survey results, all eligible patients will be referred to the [facility] program for an evaluation so we can better serve you!  A frequently asked questions sheet is available upon request. Potassium Content of Foods Potassium is a mineral found in many foods and drinks. It helps keep fluids and minerals balanced in your body and affects how steadily your heart beats. Potassium also helps control your blood pressure and keep your muscles and nervous system healthy. Certain health conditions and medicines may change the balance of potassium in your body. When this happens, you can help balance your level of potassium through the foods that you do or do not eat. Your health care provider or dietitian may recommend an amount of potassium that you should have each day. The following lists of foods provide the amount of potassium (in parentheses) per serving in each item. HIGH IN POTASSIUM  The following foods and beverages have 200 mg or more of potassium per serving:  Apricots,  2 raw or 5 dry (200 mg).  Artichoke, 1 medium (345 mg).  Avocado, raw,  each (245 mg).  Banana, 1 medium (425 mg).  Beans, lima, or baked beans, canned,  cup (280 mg).  Beans, white, canned,  cup (595 mg).  Beef roast, 3 oz (320  mg).  Beef, ground, 3 oz (270 mg).  Beets, raw or cooked,  cup (260 mg).  Bran muffin, 2 oz (300 mg).  Broccoli,  cup (230 mg).  Brussels sprouts,  cup (250 mg).  Cantaloupe,  cup (215 mg).  Cereal, 100% bran,  cup (200-400 mg).  Cheeseburger, single, fast food, 1 each (225-400 mg).  Chicken, 3 oz (220 mg).  Clams, canned, 3 oz (535 mg).  Crab, 3 oz (225 mg).  Dates, 5 each (270 mg).  Dried beans and peas,  cup (300-475 mg).  Figs, dried, 2 each (260 mg).  Fish: halibut, tuna, cod, snapper, 3 oz (480 mg).  Fish: salmon, haddock, swordfish, perch, 3 oz (300 mg).  Fish, tuna, canned 3 oz (200 mg).  [place] fries, fast food, 3 oz (470 mg).  Granola with fruit and nuts,  cup (200 mg).  Grapefruit juice,  cup (200 mg).  Greens, beet,  cup (655 mg).  Honeydew melon,  cup (200 mg).  Kale, raw, 1 cup (300 mg).  Kiwi, 1 medium (240 mg).  Kohlrabi, rutabaga, parsnips,  cup (280 mg).  Lentils,  cup (365 mg).  Mango, 1 each (325 mg).  Milk, chocolate, 1 cup (420 mg).  Milk: nonfat, low-fat, whole, buttermilk, 1 cup (350-380 mg).  Molasses, 1 Tbsp (295 mg).  Mushrooms,  cup (280) mg.  Nectarine, 1 each (275 mg).  Nuts: almonds, peanuts, hazelnuts, [place], cashew, mixed, 1 oz (200 mg).  Nuts, pistachios, 1 oz (295 mg).  Orange, 1 each (240 mg).  Orange juice,  cup (235 mg).  Papaya, medium,  fruit (390 mg).  Peanut butter, chunky, 2 Tbsp (240 mg).  Peanut butter, smooth, 2 Tbsp (210 mg).  Pear, 1 medium (200 mg).  Pomegranate, 1 whole (400 mg).  Pomegranate juice,  cup (215 mg).  Pork, 3 oz (350 mg).  Potato chips, salted, 1 oz (465 mg).  Potato, baked with skin, 1 medium (925 mg).  Potatoes, boiled,  cup (255 mg).  Potatoes, mashed,  cup (330 mg).  Prune juice,  cup (370 mg).  Prunes, 5 each (305 mg).  Pudding, chocolate,  cup (230 mg).  Pumpkin, canned,  cup (250 mg).  Raisins, seedless,  cup (270  mg).  Seeds, sunflower or pumpkin, 1 oz (240 mg).  Soy milk, 1 cup (300 mg).  Spinach,  cup (420 mg).  Spinach, canned,  cup (370 mg).  Sweet potato, baked with skin, 1 medium (450 mg).  Swiss chard,  cup (480 mg).  Tomato or vegetable juice,  cup (275 mg).  Tomato sauce or puree,  cup (400-550 mg).  Tomato, raw, 1 medium (290 mg).  Tomatoes, canned,  cup (200-300 mg).  [place], 3 oz (250 mg).  Wheat germ, 1 oz (250 mg).  Winter squash,  cup (250 mg).  Yogurt, plain or fruited, 6 oz (260-435 mg).  Zucchini,  cup (220 mg). MODERATE IN POTASSIUM The following foods and beverages have 50-200 mg of potassium per serving:  Apple, 1 each (150 mg).  Apple juice,  cup (150 mg).  Applesauce,  cup (90 mg).  Apricot nectar,  cup (140 mg).  Asparagus, small spears,  cup or 6 spears (155 mg).  Bagel, cinnamon raisin, 1 each (130 mg).  Bagel, egg or plain, 4 in., 1 each (70 mg).  Beans, green,  cup (90 mg).  Beans, yellow,  cup (190 mg).  Beer, regular, 12 oz (100 mg).  Beets, canned,  cup (125 mg).  Blackberries,  cup (115 mg).  Blueberries,  cup (60 mg).  Bread, whole wheat, 1 slice (70 mg).  Broccoli, raw,  cup (145 mg).  Cabbage,  cup (150 mg).  Carrots, cooked or raw,  cup (180 mg).  Cauliflower, raw,  cup (150 mg).  Celery, raw,  cup (155 mg).  Cereal, bran flakes, cup (120-150 mg).  Cheese, cottage,  cup (110 mg).  Cherries, 10 each (150 mg).  Chocolate, 1 oz bar (165 mg).  Coffee, brewed 6 oz (90 mg).  Corn,  cup or 1 ear (195 mg).  Cucumbers,  cup (80 mg).  Egg, large, 1 each (60 mg).  Eggplant,  cup (60 mg).  Endive, raw, cup (80 mg).  English muffin, 1 each (65 mg).  Fish, orange roughy, 3 oz (150 mg).  Frankfurter, beef or pork, 1 each (75 mg).  Fruit cocktail,  cup (115 mg).  Grape juice,  cup (170 mg).  Grapefruit,  fruit (175 mg).  Grapes,  cup (155 mg).  Greens: kale, turnip,  collard,  cup (110-150 mg).  Ice cream or frozen yogurt, chocolate,  cup (175 mg).  Ice cream or frozen yogurt, vanilla,  cup (120-150 mg).  Lemons, limes, 1 each (80 mg).  Lettuce, all types, 1 cup (100 mg).  Mixed vegetables,  cup (150 mg).  Mushrooms, raw,  cup (110 mg).  Nuts: walnuts, pecans, or macadamia, 1 oz (125 mg).  Oatmeal,  cup (80 mg).  Okra,  cup (110 mg).  Onions, raw,  cup (120 mg).  Peach, 1 each (185 mg).  Peaches, canned,  cup (120 mg).  Pears, canned,  cup (120 mg).  Peas, green, frozen,  cup (90 mg).  Peppers, green,  cup (130 mg).  Peppers, red,  cup (160 mg).  Pineapple juice,  cup (165 mg).  Pineapple, fresh or canned,  cup (100 mg).  Plums, 1 each (105 mg).  Pudding, vanilla,  cup (150 mg).  Raspberries,  cup (90 mg).  Rhubarb,  cup (115 mg).  Rice, wild,  cup (80 mg).  Shrimp, 3 oz (155 mg).  Spinach, raw, 1 cup (170 mg).  Strawberries,  cup (125 mg).  Summer squash  cup (175-200 mg).  Swiss chard, raw, 1 cup (135 mg).  Tangerines, 1 each (140 mg).  Tea, brewed, 6 oz (65 mg).  Turnips,  cup (140 mg).  Watermelon,  cup (85 mg).  Wine, red, table, 5 oz (180 mg).  Wine, white, table, 5 oz (100 mg). LOW IN POTASSIUM The following foods and beverages have less than 50 mg of potassium per serving.  Bread, white, 1 slice (30 mg).  Carbonated beverages, 12 oz (less than 5 mg).  Cheese, 1 oz (20-30 mg).  Cranberries,  cup (45 mg).  Cranberry juice cocktail,  cup (20 mg).  Fats and oils, 1 Tbsp (less than 5 mg).  Hummus, 1 Tbsp (32 mg).  Nectar: papaya, mango, or pear,  cup (35 mg).  Rice, white or brown,  cup (50 mg).  Spaghetti or macaroni,  cup cooked (30 mg).  Tortilla, flour or corn, 1 each (50 mg).  Waffle, 4 in., 1 each (50 mg).  Water chestnuts,  cup (40 mg). Document Released: 08/18/2004 Document  Revised: 01/09/2013 Document Reviewed: 12/01/2012 [facility] Patient  Information 2015 [place], [place]. This information is not intended to replace advice given to you by your health care provider. Make sure you discuss any questions you have with your health care provider.

## 2013-11-19 ENCOUNTER — Encounter (HOSPITAL_COMMUNITY): Payer: Self-pay | Admitting: Oncology

## 2014-01-09 ENCOUNTER — Other Ambulatory Visit (HOSPITAL_COMMUNITY): Payer: Self-pay | Admitting: Family Medicine

## 2014-01-09 DIAGNOSIS — Z139 Encounter for screening, unspecified: Secondary | ICD-10-CM

## 2014-01-14 ENCOUNTER — Ambulatory Visit (HOSPITAL_COMMUNITY): Payer: Medicare Other

## 2014-01-14 ENCOUNTER — Other Ambulatory Visit (HOSPITAL_COMMUNITY): Payer: Medicare Other

## 2014-02-01 ENCOUNTER — Ambulatory Visit (HOSPITAL_COMMUNITY)
Admission: RE | Admit: 2014-02-01 | Discharge: 2014-02-01 | Disposition: A | Discharge status: Home / Self Care | Payer: Medicare Other | Source: Ambulatory Visit | Attending: Family Medicine | Admitting: Family Medicine

## 2014-02-01 DIAGNOSIS — F17210 Nicotine dependence, cigarettes, uncomplicated: Secondary | ICD-10-CM | POA: Insufficient documentation

## 2014-02-01 DIAGNOSIS — Z13820 Encounter for screening for osteoporosis: Secondary | ICD-10-CM | POA: Diagnosis not present

## 2014-02-01 DIAGNOSIS — M810 Age-related osteoporosis without current pathological fracture: Secondary | ICD-10-CM | POA: Diagnosis not present

## 2014-02-01 DIAGNOSIS — Z139 Encounter for screening, unspecified: Secondary | ICD-10-CM

## 2014-02-01 DIAGNOSIS — Z780 Asymptomatic menopausal state: Secondary | ICD-10-CM | POA: Insufficient documentation

## 2014-02-18 ENCOUNTER — Ambulatory Visit (HOSPITAL_COMMUNITY)
Admission: RE | Admit: 2014-02-18 | Discharge: 2014-02-18 | Disposition: A | Discharge status: Home / Self Care | Payer: Medicare Other | Source: Ambulatory Visit | Attending: Family Medicine | Admitting: Family Medicine

## 2014-02-18 ENCOUNTER — Other Ambulatory Visit (HOSPITAL_COMMUNITY): Payer: Self-pay | Admitting: Family Medicine

## 2014-02-18 DIAGNOSIS — M25572 Pain in left ankle and joints of left foot: Secondary | ICD-10-CM | POA: Diagnosis not present

## 2014-03-01 ENCOUNTER — Encounter (HOSPITAL_COMMUNITY): Payer: Medicare Other | Attending: Hematology & Oncology

## 2014-03-01 ENCOUNTER — Ambulatory Visit (HOSPITAL_COMMUNITY): Payer: Medicare Other

## 2014-03-01 ENCOUNTER — Other Ambulatory Visit (HOSPITAL_COMMUNITY): Payer: Medicare Other

## 2014-03-01 ENCOUNTER — Ambulatory Visit (HOSPITAL_COMMUNITY)
Admission: RE | Admit: 2014-03-01 | Discharge: 2014-03-01 | Disposition: A | Discharge status: Home / Self Care | Payer: Medicare Other | Source: Ambulatory Visit | Attending: Family Medicine | Admitting: Family Medicine

## 2014-03-01 DIAGNOSIS — D472 Monoclonal gammopathy: Secondary | ICD-10-CM | POA: Diagnosis present

## 2014-03-01 DIAGNOSIS — Z1231 Encounter for screening mammogram for malignant neoplasm of breast: Secondary | ICD-10-CM | POA: Diagnosis not present

## 2014-03-01 DIAGNOSIS — Z139 Encounter for screening, unspecified: Secondary | ICD-10-CM

## 2014-03-01 LAB — CBC WITH DIFFERENTIAL/PLATELET
Basophils Absolute: 0.1 10*3/uL (ref 0.0–0.1)
Basophils Relative: 1 % (ref 0–1)
Eosinophils Absolute: 0.4 10*3/uL (ref 0.0–0.7)
Eosinophils Relative: 5 % (ref 0–5)
HCT: 40.2 % (ref 36.0–46.0)
Hemoglobin: 13.7 g/dL (ref 12.0–15.0)
LYMPHS PCT: 18 % (ref 12–46)
Lymphs Abs: 1.5 10*3/uL (ref 0.7–4.0)
MCH: 31.7 pg (ref 26.0–34.0)
MCHC: 34.1 g/dL (ref 30.0–36.0)
MCV: 93.1 fL (ref 78.0–100.0)
MONO ABS: 0.6 10*3/uL (ref 0.1–1.0)
Monocytes Relative: 7 % (ref 3–12)
NEUTROS ABS: 6 10*3/uL (ref 1.7–7.7)
NEUTROS PCT: 69 % (ref 43–77)
Platelets: 293 10*3/uL (ref 150–400)
RBC: 4.32 MIL/uL (ref 3.87–5.11)
RDW: 13.7 % (ref 11.5–15.5)
WBC: 8.6 10*3/uL (ref 4.0–10.5)

## 2014-03-01 LAB — COMPREHENSIVE METABOLIC PANEL
ALT: 17 U/L (ref 0–35)
AST: 20 U/L (ref 0–37)
Albumin: 4.2 g/dL (ref 3.5–5.2)
Alkaline Phosphatase: 71 U/L (ref 39–117)
Anion gap: 4 — ABNORMAL LOW (ref 5–15)
BUN: 14 mg/dL (ref 6–23)
CALCIUM: 8.9 mg/dL (ref 8.4–10.5)
CO2: 27 mmol/L (ref 19–32)
CREATININE: 0.61 mg/dL (ref 0.50–1.10)
Chloride: 107 mmol/L (ref 96–112)
GFR calc Af Amer: >90 mL/min (ref >90)
Glucose, Bld: 79 mg/dL (ref 70–99)
Potassium: 3.7 mmol/L (ref 3.5–5.1)
Sodium: 138 mmol/L (ref 135–145)
Total Bilirubin: 0.6 mg/dL (ref 0.3–1.2)
Total Protein: 7.9 g/dL (ref 6.0–8.3)

## 2014-03-01 LAB — LACTATE DEHYDROGENASE: LDH: 92 U/L — ABNORMAL LOW (ref 94–250)

## 2014-03-01 LAB — SEDIMENTATION RATE: SED RATE: 16 mm/h (ref 0–22)

## 2014-03-01 NOTE — Progress Notes (Signed)
Labs drawn

## 2014-03-02 LAB — BETA 2 MICROGLOBULIN, SERUM: BETA 2 MICROGLOBULIN: 1 mg/L (ref 0.6–2.4)

## 2014-03-02 LAB — C-REACTIVE PROTEIN

## 2014-03-03 LAB — KAPPA/LAMBDA LIGHT CHAINS
Kappa free light chain: 256.79 mg/L — ABNORMAL HIGH (ref 3.30–19.40)
Kappa, lambda light chain ratio: 42.87 — ABNORMAL HIGH (ref 0.26–1.65)
Lambda free light chains: 5.99 mg/L (ref 5.71–26.30)

## 2014-03-04 ENCOUNTER — Ambulatory Visit (HOSPITAL_COMMUNITY): Payer: Medicare Other | Admitting: Oncology

## 2014-03-05 LAB — MULTIPLE MYELOMA PANEL, SERUM
ALPHA-1-GLOBULIN: 4.8 % (ref 2.9–4.9)
Albumin ELP: 57.5 % (ref 55.8–66.1)
Alpha-2-Globulin: 9.7 % (ref 7.1–11.8)
BETA 2: 3.1 % — AB (ref 3.2–6.5)
BETA GLOBULIN: 4.9 % (ref 4.7–7.2)
GAMMA GLOBULIN: 20 % — AB (ref 11.1–18.8)
IGA: 33 mg/dL — AB (ref 69–380)
IGG (IMMUNOGLOBIN G), SERUM: 1700 mg/dL — AB (ref 690–1700)
IgM, Serum: 43 mg/dL — ABNORMAL LOW (ref 52–322)
M-SPIKE, %: 1.17 g/dL
Total Protein: 7.3 g/dL (ref 6.0–8.3)

## 2014-03-14 ENCOUNTER — Encounter (HOSPITAL_BASED_OUTPATIENT_CLINIC_OR_DEPARTMENT_OTHER): Payer: Medicare Other | Admitting: Oncology

## 2014-03-14 ENCOUNTER — Encounter (HOSPITAL_COMMUNITY): Payer: Self-pay | Admitting: Oncology

## 2014-03-14 VITALS — BP 128/87 | HR 79 | Temp 98.2°F | Resp 20 | Wt 95.0 lb

## 2014-03-14 DIAGNOSIS — M810 Age-related osteoporosis without current pathological fracture: Secondary | ICD-10-CM | POA: Insufficient documentation

## 2014-03-14 DIAGNOSIS — D472 Monoclonal gammopathy: Secondary | ICD-10-CM

## 2014-03-14 HISTORY — DX: Age-related osteoporosis without current pathological fracture: M81.0

## 2014-03-14 MED ORDER — HYDROCODONE-ACETAMINOPHEN 5-325 MG PO TABS: 1.0000 | ORAL_TABLET | Freq: Four times a day (QID) | ORAL | Status: DC | PRN

## 2014-03-14 NOTE — Assessment & Plan Note (Addendum)
Constellation of findings (laboratory and radiographic) most recently require further work-up for transition from MGUS to multiple myeloma.  Given this, it is time to repeat skeletal survey and bone marrow aspiration and biopsy.  Her last bone marrow was in June 2012 and at that time, she had 7% plasma cells (>9% defines multiple myeloma).  Fortunately, she does not display any obvious CRAB findings.  Calcium is WNL, Hgb is WNL, renal function is stable and WNL.  Skeletal survey will evaluate for any osseous lesions.  If negative, further study with PET scan may be indicated.  Bone marrow aspiration and biopsy and skeletal survey will be performed on March 7th with a follow-up appointment to review data on March 15th.  Rx for Hydrocodone for bone marrow testing pain/discomfort.

## 2014-03-14 NOTE — Assessment & Plan Note (Addendum)
Worsening.  Started weekly Fosamax.  Calcium 1000 mg and Vit D 1000 units encouraged; patient reports poor compliance.  If examination for multiple myeloma is negative, I would request her primary care provider consider changing her osteoporosis medication from Fosamax to Prolia or IV bisphosphonate therapy due to high risk of poor absorption of oral Fosamax.

## 2014-03-14 NOTE — Progress Notes (Signed)
Misty Richardson., MD [place] [address]  Osteoporosis  MGUS (monoclonal gammopathy of unknown significance) - Plan: HYDROcodone-acetaminophen (NORCO/VICODIN) 5-325 MG per tablet, DG Bone Survey Met  CURRENT THERAPY: Observation  INTERVAL HISTORY: Misty Richardson 59 y.o. female returns for followup of MGUS with 3% to 7% plasma cells.  I personally reviewed and went over radiographic studies with the patient.  The results are noted within this dictation.  Osteoporosis is noted to progress.    I personally reviewed and went over laboratory results with the patient.  The results are noted within this dictation. M-Spike is up slightly, IgG level is upper limites of normal now at 1700, kappa light chain is more elevated, and kappa:lamba ratio is increased.  With multiple myeloma markers slowly increasing, and osteoporosis is worsening, now is a good time to work-up MGUS further with skeletal survey and repeat bone marrow aspiration and biopsy.  Her plasma cells were at 7% in 2012 at time of last bone marrow aspiration and biopsy.  She is agreeable to this plan of action.  She denies any complaints and hematologic ROS questioning is negative.    Past Medical History  Diagnosis Date  . COPD (chronic obstructive pulmonary disease)   . MGUS (monoclonal gammopathy of unknown significance) 09/05/2012  . COPD (chronic obstructive pulmonary disease) 09/05/2012  . Osteoporosis 03/14/2014    has MGUS (monoclonal gammopathy of unknown significance); COPD (chronic obstructive pulmonary disease); Tobacco use disorder; and Osteoporosis on her problem list.     is allergic to avelox.  Misty Richardson had no medications administered during this visit.  Past Surgical History  Procedure Laterality Date  . Abdominal hysterectomy    . Appendectomy    . Cesarean section      Denies any headaches, dizziness, double vision, fevers, chills, night sweats, nausea,  vomiting, diarrhea, constipation, chest pain, heart palpitations, shortness of breath, blood in stool, black tarry stool, urinary pain, urinary burning, urinary frequency, hematuria.   PHYSICAL EXAMINATION  ECOG PERFORMANCE STATUS: 0 - Asymptomatic  Filed Vitals:   03/14/14 1300  BP: 128/87  Pulse: 79  Temp: 98.2 F (36.8 C)  Resp: 20    GENERAL:alert, no distress, well nourished, well developed, comfortable, cooperative, smiling and looks older than her stated age, accompanied by her mother and mentally handicapped son. SKIN: skin color, texture, turgor are normal, no rashes or significant lesions, thickened facial skin HEAD: Normocephalic, No masses, lesions, tenderness or abnormalities EYES: normal, PERRLA, EOMI, Conjunctiva are pink and non-injected EARS: External ears normal OROPHARYNX:lips, buccal mucosa, and tongue normal and mucous membranes are moist  NECK: supple, trachea midline LYMPH:  not examined BREAST:not examined LUNGS: not examined HEART: not examined ABDOMEN:not examined BACK: Back symmetric, no curvature. EXTREMITIES:less then 2 second capillary refill, no joint deformities, effusion, or inflammation, no skin discoloration, no cyanosis  NEURO: alert & oriented x 3 with fluent speech, no focal motor/sensory deficits, gait normal   LABORATORY DATA: CBC    Component Value Date/Time   WBC 8.6 03/01/2014 1303   RBC 4.32 03/01/2014 1303   HGB 13.7 03/01/2014 1303   HCT 40.2 03/01/2014 1303   PLT 293 03/01/2014 1303   MCV 93.1 03/01/2014 1303   MCH 31.7 03/01/2014 1303   MCHC 34.1 03/01/2014 1303   RDW 13.7 03/01/2014 1303   LYMPHSABS 1.5 03/01/2014 1303   MONOABS 0.6 03/01/2014 1303   EOSABS 0.4 03/01/2014 1303   BASOSABS 0.1 03/01/2014 1303  Chemistry      Component Value Date/Time   NA 138 03/01/2014 1303   K 3.7 03/01/2014 1303   CL 107 03/01/2014 1303   CO2 27 03/01/2014 1303   BUN 14 03/01/2014 1303   CREATININE 0.61 03/01/2014 1303    CREATININE 0.61 08/29/2008 1556      Component Value Date/Time   CALCIUM 8.9 03/01/2014 1303   ALKPHOS 71 03/01/2014 1303   AST 20 03/01/2014 1303   ALT 17 03/01/2014 1303   BILITOT 0.6 03/01/2014 1303       RADIOGRAPHIC STUDIES:  Dg Ankle Complete Left  02/18/2014   CLINICAL DATA:  Left ankle pain for the past 5 days; history of multiple sprains  EXAM: LEFT ANKLE COMPLETE - 3+ VIEW  COMPARISON:  None.  FINDINGS: The ankle joint mortise is preserved. There is irregularity of the tip of the lateral malleolus which may reflect an old avulsion. There is no significant overlying soft tissue swelling. The medial and posterior malleoli are intact. The talar dome and the calcaneus are unremarkable.  IMPRESSION: There are chronic changes associated with the tip of the lateral malleolus which may reflect the sequelae of previous injury. There is no acute bony abnormality of the ankle.   Electronically Signed   By: David  Jordan   On: 02/18/2014 15:14   Mm Digital Screening Bilateral  03/01/2014   CLINICAL DATA:  Screening.  EXAM: DIGITAL SCREENING BILATERAL MAMMOGRAM WITH CAD  COMPARISON:  Previous exam(s).  ACR Breast Density Category c: The breast tissue is heterogeneously dense, which may obscure small masses.  FINDINGS: There are no findings suspicious for malignancy. Images were processed with CAD.  IMPRESSION: No mammographic evidence of malignancy. A result letter of this screening mammogram will be mailed directly to the patient.  RECOMMENDATION: Screening mammogram in one year. (Code:SM-B-01Y)  BI-RADS CATEGORY  1: Negative.   Electronically Signed   By: Jennifer  Jarosz M.D.   On: 03/01/2014 16:06     PATHOLOGY:  Diagnosis Bone Marrow, Aspirate,Biopsy, and Clot, left posterior superior iliac spinous process - VARIABLY CELLULAR BONE MARROW WITH TRILINEAGE HEMATOPOIESIS. - PLASMA CELL DYSCRASIA (7% PLASMA CELLS). - SEE COMMENT. PERIPHERAL BLOOD: - SLIGHT THROMBOCYTOSIS. Diagnosis  Note The plasma cells represent 7% of all cells associated with small clusters. The plasma cells show kappa light chain restriction consistent with plasma cell dyscrasia. Misty SMIR MD Pathologist, Electronic Signature (Case signed 07/02/2010)    ASSESSMENT AND PLAN:  MGUS (monoclonal gammopathy of unknown significance) Constellation of findings (laboratory and radiographic) most recently require further work-up for transition from MGUS to multiple myeloma.  Given this, it is time to repeat skeletal survey and bone marrow aspiration and biopsy.  Her last bone marrow was in June 2012 and at that time, she had 7% plasma cells (>9% defines multiple myeloma).  Fortunately, she does not display any obvious CRAB findings.  Calcium is WNL, Hgb is WNL, renal function is stable and WNL.  Skeletal survey will evaluate for any osseous lesions.  If negative, further study with PET scan may be indicated.  Bone marrow aspiration and biopsy and skeletal survey will be performed on March 7th with a follow-up appointment to review data on March 15th.  Rx for Hydrocodone for bone marrow testing pain/discomfort.   Osteoporosis Worsening.  Started weekly Fosamax.  Calcium 1000 mg and Vit D 1000 units encouraged; patient reports poor compliance.  If examination for multiple myeloma is negative, I would request her primary care provider consider changing her   osteoporosis medication from Fosamax to Prolia or IV bisphosphonate therapy due to high risk of poor absorption of oral Fosamax.   THERAPY PLAN:  Given recent findings, the patient needs re-evaluation fror transformation from MGUS to multiple myeloma with bone survey and repeat bone marrow aspiration and biopsy.  If bone survey is negative, she may need PET imaging.  All questions were answered. The patient knows to call the clinic with any problems, questions or concerns. We can certainly see the patient much sooner if necessary.  Patient and plan discussed  with Dr. Shannon Penland and she is in agreement with the aforementioned.   This note is electronically signed by: KEFALAS,THOMAS 03/14/2014 3:38 PM   

## 2014-03-14 NOTE — Patient Instructions (Addendum)
[place] at [facility]  Discharge Instructions:  We need to perform a skeletal survey to evaluate your bones.  Given you worsening osteoporosis, you need another bone marrow aspiration and biopsy to see if your MGUS has changed. Rx for Hydrocodone printed for pain medication.  You may take 1 tablet 1 hour prior to bone marrow test and then can be repeated every 6 hours as needed. Return in 3 weeks for follow-up. _______________________________________________________________  Thank you for choosing [place] at [facility] to provide your oncology and hematology care.  To afford each patient quality time with our providers, please arrive at least 15 minutes before your scheduled appointment.  You need to re-schedule your appointment if you arrive 10 or more minutes late.  We strive to give you quality time with our providers, and arriving late affects you and other patients whose appointments are after yours.  Also, if you no show three or more times for appointments you may be dismissed from the clinic.  Again, thank you for choosing [place] [facility] [place] hope is that these requests will allow you access to exceptional care and in a timely manner. _______________________________________________________________  If you have questions after your visit, please contact our office at [phone] between the hours of 8:30 a.m. and 5:00 p.m. Voicemails left after 4:30 p.m. will not be returned until the following business day. _______________________________________________________________  For prescription refill requests, have your pharmacy contact our office. _______________________________________________________________  Recommendations made by the consultant and any test results will be sent to your referring physician. _______________________________________________________________

## 2014-03-19 HISTORY — PX: BONE MARROW BIOPSY: SHX199

## 2014-03-25 ENCOUNTER — Other Ambulatory Visit (HOSPITAL_COMMUNITY): Payer: Medicare Other

## 2014-03-25 ENCOUNTER — Encounter (HOSPITAL_COMMUNITY): Payer: Self-pay | Admitting: Hematology & Oncology

## 2014-03-25 ENCOUNTER — Ambulatory Visit (HOSPITAL_COMMUNITY)
Admission: RE | Admit: 2014-03-25 | Discharge: 2014-03-25 | Disposition: A | Discharge status: Home / Self Care | Payer: Medicare Other | Source: Ambulatory Visit | Attending: Oncology | Admitting: Oncology

## 2014-03-25 ENCOUNTER — Encounter (HOSPITAL_COMMUNITY): Payer: Medicare Other | Attending: Hematology & Oncology | Admitting: Hematology & Oncology

## 2014-03-25 VITALS — BP 81/49 | HR 61 | Temp 97.8°F | Resp 15

## 2014-03-25 DIAGNOSIS — M810 Age-related osteoporosis without current pathological fracture: Secondary | ICD-10-CM

## 2014-03-25 DIAGNOSIS — M8448XD Pathological fracture, other site, subsequent encounter for fracture with routine healing: Secondary | ICD-10-CM | POA: Diagnosis not present

## 2014-03-25 DIAGNOSIS — D472 Monoclonal gammopathy: Secondary | ICD-10-CM | POA: Insufficient documentation

## 2014-03-25 DIAGNOSIS — M8588 Other specified disorders of bone density and structure, other site: Secondary | ICD-10-CM | POA: Insufficient documentation

## 2014-03-25 DIAGNOSIS — J449 Chronic obstructive pulmonary disease, unspecified: Secondary | ICD-10-CM

## 2014-03-25 HISTORY — PX: BONE BIOPSY: SHX375

## 2014-03-25 HISTORY — PX: BONE MARROW ASPIRATION: SHX1252

## 2014-03-25 LAB — CBC WITH DIFFERENTIAL/PLATELET
BASOS PCT: 1 % (ref 0–1)
Basophils Absolute: 0.1 10*3/uL (ref 0.0–0.1)
EOS ABS: 0.4 10*3/uL (ref 0.0–0.7)
EOS PCT: 6 % — AB (ref 0–5)
HEMATOCRIT: 39.4 % (ref 36.0–46.0)
Hemoglobin: 13.5 g/dL (ref 12.0–15.0)
LYMPHS ABS: 2.7 10*3/uL (ref 0.7–4.0)
Lymphocytes Relative: 36 % (ref 12–46)
MCH: 31.8 pg (ref 26.0–34.0)
MCHC: 34.3 g/dL (ref 30.0–36.0)
MCV: 92.9 fL (ref 78.0–100.0)
MONO ABS: 0.6 10*3/uL (ref 0.1–1.0)
MONOS PCT: 7 % (ref 3–12)
NEUTROS PCT: 50 % (ref 43–77)
Neutro Abs: 3.8 10*3/uL (ref 1.7–7.7)
Platelets: 434 10*3/uL — ABNORMAL HIGH (ref 150–400)
RBC: 4.24 MIL/uL (ref 3.87–5.11)
RDW: 13.6 % (ref 11.5–15.5)
WBC: 7.5 10*3/uL (ref 4.0–10.5)

## 2014-03-25 LAB — BONE MARROW EXAM: Bone Marrow Exam: 161

## 2014-03-25 NOTE — Progress Notes (Signed)
[facility] PROGRESS NOTE  Misty Richardson presents for Bone Marrow biopsy per MD orders. Misty Richardson verbalized understanding of procedure. Xanax 0.5 mg po and Hydrocodone 5/325 po taken by patient at 0735 at home prior to arrival. Consent reviewed and signed. (signed on 03/14/14 during office visit) Misty Richardson positioned supine for procedure. Time-out performed and Bone Marrow Checklist. Procedure began at 0835. Xylocaine 1% 20 cc used for local and administered to patient by Dr. Whitney Muse. Procedure completed at 0853. Patient tolerated well. Pressure dressing applied to the left hip with instructions to leave in place for 24 hours. Patient instructed to report any bleeding that saturates dressing and to take pain medication Hyrocodone5/325 as directed. Dressing dry and intact to the left hip on discharge.

## 2014-03-25 NOTE — Progress Notes (Signed)
Misty Richardson., MD [place] [address]  MGUS (monoclonal gammopathy of unknown significance) - Plan: CBC with Differential, CBC with Differential  CURRENT THERAPY: Observation  INTERVAL HISTORY: Misty Richardson 59 y.o. female returns for followup of MGUS with 3% to 7% plasma cells.  With multiple myeloma markers slowly increasing, and osteoporosis is worsening, now is a good time to work-up MGUS further with skeletal survey and repeat bone marrow aspiration and biopsy.  Her plasma cells were at 7% in 2012 at time of last bone marrow aspiration and biopsy.  She is agreeable to this plan of action.  She denies any significant problems since her last visit. The risks and benefits of the bone marrow biopsy were reviewed with her. She understands she can have pain after the procedure or have problems with bleeding. I reviewed with her to leave the pressure bandage on for 24 hours post procedure. All of her questions were answered prior to beginning.    Past Medical History  Diagnosis Date  . COPD (chronic obstructive pulmonary disease)   . MGUS (monoclonal gammopathy of unknown significance) 09/05/2012  . COPD (chronic obstructive pulmonary disease) 09/05/2012  . Osteoporosis 03/14/2014    has MGUS (monoclonal gammopathy of unknown significance); COPD (chronic obstructive pulmonary disease); Tobacco use disorder; and Osteoporosis on her problem list.     is allergic to avelox.  Ms. Karle does not currently have medications on file.  Past Surgical History  Procedure Laterality Date  . Abdominal hysterectomy    . Appendectomy    . Cesarean section    . Bone biopsy Left 03/25/14  . Bone marrow aspiration Left 03/25/14    Denies any headaches, dizziness, double vision, fevers, chills, night sweats, nausea, vomiting, diarrhea, constipation, chest pain, heart palpitations, shortness of breath, blood in stool, black tarry stool, urinary pain, urinary  burning, urinary frequency, hematuria.   PHYSICAL EXAMINATION  ECOG PERFORMANCE STATUS: 0 - Asymptomatic  Filed Vitals:   03/25/14 1000  BP: 81/49  Pulse: 61  Temp:   Resp: 15    GENERAL:alert, no distress, well nourished, well developed, comfortable, cooperative, smiling and looks older than her stated age, accompanied by her mother and mentally handicapped son. SKIN: skin color, texture, turgor are normal, no rashes or significant lesions, thickened facial skin HEAD: Normocephalic, No masses, lesions, tenderness or abnormalities EYES: normal, PERRLA, EOMI, Conjunctiva are pink and non-injected EARS: External ears normal OROPHARYNX:lips, buccal mucosa, and tongue normal and mucous membranes are moist  NECK: supple, trachea midline LYMPH:  not examined BREAST:not examined LUNGS: not examined HEART: not examined ABDOMEN:not examined BACK: Back symmetric, no curvature. EXTREMITIES:less then 2 second capillary refill, no joint deformities, effusion, or inflammation, no skin discoloration, no cyanosis  NEURO: alert & oriented x 3 with fluent speech, no focal motor/sensory deficits, gait normal   LABORATORY DATA: CBC    Component Value Date/Time   WBC 7.5 03/25/2014 0819   RBC 4.24 03/25/2014 0819   HGB 13.5 03/25/2014 0819   HCT 39.4 03/25/2014 0819   PLT 434* 03/25/2014 0819   MCV 92.9 03/25/2014 0819   MCH 31.8 03/25/2014 0819   MCHC 34.3 03/25/2014 0819   RDW 13.6 03/25/2014 0819   LYMPHSABS 2.7 03/25/2014 0819   MONOABS 0.6 03/25/2014 0819   EOSABS 0.4 03/25/2014 0819   BASOSABS 0.1 03/25/2014 0819      Chemistry      Component Value Date/Time   NA 138 03/01/2014 1303  K 3.7 03/01/2014 1303   CL 107 03/01/2014 1303   CO2 27 03/01/2014 1303   BUN 14 03/01/2014 1303   CREATININE 0.61 03/01/2014 1303   CREATININE 0.61 08/29/2008 1556      Component Value Date/Time   CALCIUM 8.9 03/01/2014 1303   ALKPHOS 71 03/01/2014 1303   AST 20 03/01/2014 1303   ALT  17 03/01/2014 1303   BILITOT 0.6 03/01/2014 1303       RADIOGRAPHIC STUDIES:  Dg Bone Survey Met  03/25/2014   CLINICAL DATA:  59 year old female with monoclonal gammopathy of unknown significance. Subsequent encounter.  EXAM: METASTATIC BONE SURVEY  COMPARISON:  01/31/2012 and earlier.  FINDINGS: Calvarium bone mineralization stable and within normal limits.  Osteopenia in the cervical spine is stable. Stable cervical vertebral height and alignment. Thirteen pairs of ribs re- identified (hypoplastic ribs at L1). Chronic left tenth rib fracture appear stable. Thoracic osteopenia, height and alignment are within normal limits. No discrete rib lesion identified. Stable lumbar vertebral height and alignment.  Bone mineralization about the pelvis stable and within normal limits. Stable right lower quadrant surgical clips. Stable bone mineralization about the bilateral extremities.  IMPRESSION: Stable osteopenia with no lytic or destructive osseous lesion identified. Stable chronic left tenth rib fracture.   Electronically Signed   By: Genevie Ann M.D.   On: 03/25/2014 11:48   Mm Digital Screening Bilateral  03/01/2014   CLINICAL DATA:  Screening.  EXAM: DIGITAL SCREENING BILATERAL MAMMOGRAM WITH CAD  COMPARISON:  Previous exam(s).  ACR Breast Density Category c: The breast tissue is heterogeneously dense, which may obscure small masses.  FINDINGS: There are no findings suspicious for malignancy. Images were processed with CAD.  IMPRESSION: No mammographic evidence of malignancy. A result letter of this screening mammogram will be mailed directly to the patient.  RECOMMENDATION: Screening mammogram in one year. (Code:SM-B-01Y)  BI-RADS CATEGORY  1: Negative.   Electronically Signed   By: Everlean Alstrom M.D.   On: 03/01/2014 16:06     PATHOLOGY:  Diagnosis Bone Marrow, Aspirate,Biopsy, and Clot, left posterior superior iliac spinous process - VARIABLY CELLULAR BONE MARROW WITH TRILINEAGE HEMATOPOIESIS. -  PLASMA CELL DYSCRASIA (7% PLASMA CELLS). - SEE COMMENT. PERIPHERAL BLOOD: - SLIGHT THROMBOCYTOSIS. Diagnosis Note The plasma cells represent 7% of all cells associated with small clusters. The plasma cells show kappa light chain restriction consistent with plasma cell dyscrasia. Susanne Greenhouse MD Pathologist, Electronic Signature (Case signed 07/02/2010)     Bone Marrow Biopsy and Aspiration Procedure Note   Informed consent was obtained and potential risks including bleeding, infection and pain were reviewed with the patient.  The patient's name, date of birth, identification, consent and allergies were verified prior to the start of procedure and time out was performed.  The left posterior iliac crest was chosen as the site of biopsy.  The skin was prepped with Betadine solution.   10 cc of 1% lidocaine was used to provide local anaesthesia.   10 cc of bone marrow aspirate was obtained followed by small 1/4 inch biopsy.  Pressure was applied to the biopsy site and bandage was placed over the biopsy site. Patient was made to lie on back for 30 mins prior to discharge.  The procedure was tolerated well. COMPLICATIONS: None BLOOD LOSS: none The patient was discharged home in stable condition with a 2 week follow up to review results. She was provided with post bone marrow biopsy instructions and instructed to call if there was any bleeding or  worsening pain.  Specimens sent for flow cytometry, cytogenetics and additional studies.    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 She has an increasing M spike and an increase in total IgG, in addition worsening osteoporosis of the bone. Last bone marrow biopsy showed 7% plasma cells. Given her worsening bone disease we opted for a bone marrow biopsy. Should she have greater than or equal to 10% plasma cells I will then recommend PET imaging in  accordance with current guidelines to rule out the possibility of bone disease not seen on traditional myeloma survey. We will see her back tentatively in 2 weeks to review the results and make additional recommendations.  She is to call us in the interim with any problems or concerns. She was instructed to leave the pressure bandage on for the next 24 hours.  All questions were answered. The patient knows to call the clinic with any problems, questions or concerns. We can certainly see the patient much sooner if necessary.  I Molli Hazard 03/25/2014   This note was electronically signed

## 2014-03-25 NOTE — Patient Instructions (Addendum)
[place] at [facility] Discharge Instructions  RECOMMENDATIONS MADE BY THE CONSULTANT AND ANY TEST RESULTS WILL BE SENT TO YOUR REFERRING PHYSICIAN.  You had a bone marrow biopsy and aspiration performed by Dr. Whitney Muse. Take your hydrocodone as needed for pain Keep dressing clean and dry and in place for the next 24 hours   Thank you for choosing [place] at [facility] to provide your oncology and hematology care.  To afford each patient quality time with our provider, please arrive at least 15 minutes before your scheduled appointment time.    You need to re-schedule your appointment should you arrive 10 or more minutes late.  We strive to give you quality time with our providers, and arriving late affects you and other patients whose appointments are after yours.  Also, if you no show three or more times for appointments you may be dismissed from the clinic at the providers discretion.     Again, thank you for choosing [facility].  Our hope is that these requests will decrease the amount of time that you wait before being seen by our physicians.       _____________________________________________________________  Should you have questions after your visit to [facility] please contact our office at [phone] between the hours of 8:30 a.m. and 4:30 p.m.  Voicemails left after 4:30 p.m. will not be returned until the following business day.  For prescription refill requests, have your pharmacy contact our office.    Bone Marrow Aspiration, Bone Marrow Biopsy Care After Read the instructions outlined below and refer to this sheet in the next few weeks. These discharge instructions provide you with general information on caring for yourself after you leave the hospital. Your caregiver may also give you specific instructions. While your treatment has been planned according to the most current medical practices  available, unavoidable complications occasionally occur. If you have any problems or questions after discharge, call your caregiver. FINDING OUT THE RESULTS OF YOUR TEST Not all test results are available during your visit. If your test results are not back during the visit, make an appointment with your caregiver to find out the results. Do not assume everything is normal if you have not heard from your caregiver or the medical facility. It is important for you to follow up on all of your test results.  HOME CARE INSTRUCTIONS  You have had sedation and may be sleepy or dizzy. Your thinking may not be as clear as usual. For the next 24 hours:  Only take over-the-counter or prescription medicines for pain, discomfort, and or fever as directed by your caregiver.  Do not drink alcohol.  Do not smoke.  Do not drive.  Do not make important legal decisions.  Do not operate heavy machinery.  Do not care for small children by yourself.  Keep your dressing clean and dry. You may replace dressing with a bandage after 24 hours.  You may take a bath or shower after 24 hours.  Use an ice pack for 20 minutes every 2 hours while awake for pain as needed. SEEK MEDICAL CARE IF:   There is redness, swelling, or increasing pain at the biopsy site.  There is pus coming from the biopsy site.  There is drainage from a biopsy site lasting longer than one day.  An unexplained oral temperature above 102 F (38.9 C) develops. SEEK IMMEDIATE MEDICAL CARE IF:   You develop  a rash.  You have difficulty breathing.  You develop any reaction or side effects to medications given. Document Released: 07/24/2004 Document Revised: 03/29/2011 Document Reviewed: 01/02/2008 [facility] Patient Information 2015 [place], [place]. This information is not intended to replace advice given to you by your health care provider. Make sure you discuss any questions you have with your health care provider.

## 2014-03-27 ENCOUNTER — Encounter: Payer: Self-pay | Admitting: Oncology

## 2014-03-29 LAB — CHROMOSOME ANALYSIS, BONE MARROW

## 2014-04-02 ENCOUNTER — Ambulatory Visit (HOSPITAL_COMMUNITY): Payer: Medicare Other | Admitting: Hematology & Oncology

## 2014-04-03 LAB — TISSUE HYBRIDIZATION (BONE MARROW)-NCBH

## 2014-04-09 ENCOUNTER — Ambulatory Visit (HOSPITAL_COMMUNITY): Payer: Medicare Other | Admitting: Hematology & Oncology

## 2014-04-16 ENCOUNTER — Encounter (HOSPITAL_COMMUNITY): Payer: Self-pay | Admitting: Hematology & Oncology

## 2014-04-16 ENCOUNTER — Encounter (HOSPITAL_COMMUNITY): Payer: Self-pay | Admitting: Lab

## 2014-04-16 ENCOUNTER — Encounter (HOSPITAL_BASED_OUTPATIENT_CLINIC_OR_DEPARTMENT_OTHER): Payer: Medicare Other | Admitting: Hematology & Oncology

## 2014-04-16 VITALS — BP 118/67 | HR 83 | Temp 98.0°F | Resp 18 | Wt 92.0 lb

## 2014-04-16 DIAGNOSIS — G629 Polyneuropathy, unspecified: Secondary | ICD-10-CM | POA: Diagnosis not present

## 2014-04-16 DIAGNOSIS — M79604 Pain in right leg: Secondary | ICD-10-CM

## 2014-04-16 DIAGNOSIS — M810 Age-related osteoporosis without current pathological fracture: Secondary | ICD-10-CM | POA: Diagnosis not present

## 2014-04-16 DIAGNOSIS — D472 Monoclonal gammopathy: Secondary | ICD-10-CM | POA: Diagnosis not present

## 2014-04-16 DIAGNOSIS — M79605 Pain in left leg: Secondary | ICD-10-CM

## 2014-04-16 MED ORDER — ZOLEDRONIC ACID 5 MG/100ML IV SOLN
5.0000 mg | Freq: Once | INTRAVENOUS | Status: DC
  Filled 2014-04-16: qty 100

## 2014-04-16 NOTE — Progress Notes (Signed)
Referral sent to Dr Merlene Laughter.  Office would not make appt until the patient call and sets up a payment plan for a balance owed at the office.  I called patient to let her know to call Dr Merlene Laughter office and set it up with them 3/29

## 2014-04-16 NOTE — Patient Instructions (Signed)
..  [place] at [facility] Discharge Instructions  RECOMMENDATIONS MADE BY THE CONSULTANT AND ANY TEST RESULTS WILL BE SENT TO YOUR REFERRING PHYSICIAN.  We will check on approval for reclast  We are checking on where we can have nerve conduction studies   Return in 3 months for labs and to see Gershon Mussel  Thank you for choosing [place] at [facility] to provide your oncology and hematology care.  To afford each patient quality time with our provider, please arrive at least 15 minutes before your scheduled appointment time.    You need to re-schedule your appointment should you arrive 10 or more minutes late.  We strive to give you quality time with our providers, and arriving late affects you and other patients whose appointments are after yours.  Also, if you no show three or more times for appointments you may be dismissed from the clinic at the providers discretion.     Again, thank you for choosing [facility].  Our hope is that these requests will decrease the amount of time that you wait before being seen by our physicians.       _____________________________________________________________  Should you have questions after your visit to [facility], please contact our office at [phone] between the hours of 8:30 a.m. and 4:30 p.m.  Voicemails left after 4:30 p.m. will not be returned until the following business day.  For prescription refill requests, have your pharmacy contact our office.

## 2014-04-16 NOTE — Progress Notes (Signed)
[name]., MD [place] [address]  MGUS (monoclonal gammopathy of unknown significance) - Plan: Nerve conduction test, zoledronic acid (RECLAST) injection 5 mg, CBC with Differential, Comprehensive metabolic panel, Multiple myeloma panel, serum, Kappa/lambda light chains  Osteoporosis - Plan: Nerve conduction test, zoledronic acid (RECLAST) injection 5 mg, CBC with Differential, Comprehensive metabolic panel, Multiple myeloma panel, serum, Kappa/lambda light chains  Leg pain, bilateral - Plan: Nerve conduction test, zoledronic acid (RECLAST) injection 5 mg, CBC with Differential, Comprehensive metabolic panel, Multiple myeloma panel, serum, Kappa/lambda light chains  CURRENT THERAPY: Observation  INTERVAL HISTORY: Misty Richardson 59 y.o. female returns for followup of MGUS with 3% to 7% plasma cells. Her myeloma markers were slowly increasing and her osteoporosis has shown worsening. She agreed to bone marrow biopsy and aspirate and is here today to review the results of the procedure. She denies any significant problems with pain at the biopsy site. She did have this done here at [place]. She has problems with neuropathy in both feet and describes the pain is originating from the knees down into the feet. She rates it as a 5 today she takes Fosamax and it causes abdominal discomfort. She is wondering if she can change to yearly reclast. She has heard about the medication and is interested in changing therapy.  Past Medical History  Diagnosis Date  . COPD (chronic obstructive pulmonary disease)   . MGUS (monoclonal gammopathy of unknown significance) 09/05/2012  . COPD (chronic obstructive pulmonary disease) 09/05/2012  . Osteoporosis 03/14/2014    has MGUS (monoclonal gammopathy of unknown significance); COPD (chronic obstructive pulmonary disease); Tobacco use disorder; and Osteoporosis on her problem list.     is allergic to  avelox.  Misty Richardson does not currently have medications on file.  Past Surgical History  Procedure Laterality Date  . Abdominal hysterectomy    . Appendectomy    . Cesarean section    . Bone biopsy Left 03/25/14  . Bone marrow aspiration Left 03/25/14  . Bone marrow biopsy Left March 2016    Denies any headaches, dizziness, double vision, fevers, chills, night sweats, nausea, vomiting, diarrhea, constipation, chest pain, heart palpitations, shortness of breath, blood in stool, black tarry stool, urinary pain, urinary burning, urinary frequency, hematuria.   PHYSICAL EXAMINATION  ECOG PERFORMANCE STATUS: 0 - Asymptomatic  Filed Vitals:   04/16/14 1217  BP: 118/67  Pulse: 83  Temp: 98 F (36.7 C)  Resp: 18    GENERAL:alert, no distress, well nourished, well developed, comfortable, cooperative, smiling and looks older than her stated age, accompanied by her mother and mentally handicapped son. SKIN: skin color, texture, turgor are normal, no rashes or significant lesions, thickened facial skin HEAD: Normocephalic, No masses, lesions, tenderness or abnormalities EYES: normal, PERRLA, EOMI, Conjunctiva are pink and non-injected EARS: External ears normal OROPHARYNX:lips, buccal mucosa, and tongue normal and mucous membranes are moist  NECK: supple, trachea midline LYMPH: no palpable LAD BREAST:not examined LUNGS: CTAB no wheezing or rhonchi HEART: S1/S2 audible and regular ABDOMEN:soft/NT/ND without rebound or guarding BACK: Back symmetric, no curvature. EXTREMITIES:less then 2 second capillary refill, no joint deformities, effusion, or inflammation, no skin discoloration, no cyanosis  NEURO: alert & oriented x 3 with fluent speech, no focal motor/sensory deficits, gait normal   LABORATORY DATA: CBC    Component Value Date/Time   WBC 7.5 03/25/2014 0819   RBC 4.24 03/25/2014 0819   HGB 13.5 03/25/2014 0819  HCT 39.4 03/25/2014 0819   PLT 434* 03/25/2014 0819   MCV  92.9 03/25/2014 0819   MCH 31.8 03/25/2014 0819   MCHC 34.3 03/25/2014 0819   RDW 13.6 03/25/2014 0819   LYMPHSABS 2.7 03/25/2014 0819   MONOABS 0.6 03/25/2014 0819   EOSABS 0.4 03/25/2014 0819   BASOSABS 0.1 03/25/2014 0819      Chemistry      Component Value Date/Time   NA 138 03/01/2014 1303   K 3.7 03/01/2014 1303   CL 107 03/01/2014 1303   CO2 27 03/01/2014 1303   BUN 14 03/01/2014 1303   CREATININE 0.61 03/01/2014 1303   CREATININE 0.61 08/29/2008 1556      Component Value Date/Time   CALCIUM 8.9 03/01/2014 1303   ALKPHOS 71 03/01/2014 1303   AST 20 03/01/2014 1303   ALT 17 03/01/2014 1303   BILITOT 0.6 03/01/2014 1303        PATHOLOGY:  FINAL DIAGNOSIS Diagnosis Bone Marrow, Aspirate,Biopsy, and Clot - 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 - TRILINEAGE HEMATOPOIESIS WITH MEGAKARYOCYTIC PROLIFERATION. - SEE COMMENT. PERIPHERAL BLOOD: - THROMBOCYTOSIS. Diagnosis Note The bone marrow shows increased number of plasma cells representing 9% of all cells in the aspirate associated with several small clusters in the clot sections. Immunohistochemical stains show that the plasma cells are kappa light chain restricted, consistent with plasma cell neoplasm. Correlation with cytogenetic and FISH studies is recommended. The background shows trilineage hematopoiesis with progressive maturation. Megakaryocytes in particular are increased in number with scattered large atypical and hyperchromatic forms. The latter finding is of uncertain significance at this time, but if thrombocytosis is persistent or progressive , JAK-2 mutation analysis is recommended. Susanne Greenhouse MD Pathologist, Electronic Signature (Case signed 03/27/2014)    FISH and cytogenetics are pending    ASSESSMENT and THERAPY PLAN:   MGUS with 9% plasma cells on BMBX Osteoporosis  59 year old female with IgG kappa MGUS. She has an increasing M spike and an increase in  total IgG, in addition worsening osteoporosis of the bone. I spent time discussing her bone marrow biopsy with her today. I advised her that she has evidence of 9% plasma cells within the marrow. We reviewed current diagnostic criteria for multiple myeloma. I advised her that based upon all of her symptoms and bone marrow findings I do believe she may need treatment in the near future. I feel she needs close observation and follow-up. She is agreeable to that. I think she would be a good candidate for reclast. I reviewed some of the risks of bisphosphonates including osteonecrosis of the jaw. She states she is familiar with them as she has been on Fosamax for some time. We will plan on seeing her back in the next 2-3 months with ongoing myeloma labs.  All questions were answered. The patient knows to call the clinic with any problems, questions or concerns. We can certainly see the patient much sooner if necessary.  Jean Rosenthal MD 05/16/2014   This note was electronically signed

## 2014-06-06 ENCOUNTER — Ambulatory Visit: Payer: Medicare Other | Admitting: Neurology

## 2014-07-17 ENCOUNTER — Encounter (HOSPITAL_COMMUNITY): Payer: Self-pay | Admitting: Oncology

## 2014-07-17 ENCOUNTER — Encounter (HOSPITAL_COMMUNITY): Payer: Medicare Other | Attending: Hematology & Oncology

## 2014-07-17 ENCOUNTER — Encounter (HOSPITAL_BASED_OUTPATIENT_CLINIC_OR_DEPARTMENT_OTHER): Payer: Medicare Other | Admitting: Oncology

## 2014-07-17 ENCOUNTER — Ambulatory Visit (HOSPITAL_COMMUNITY): Payer: Medicare Other | Admitting: Oncology

## 2014-07-17 VITALS — BP 108/62 | HR 64 | Temp 98.1°F | Resp 18 | Wt 91.1 lb

## 2014-07-17 DIAGNOSIS — D472 Monoclonal gammopathy: Secondary | ICD-10-CM | POA: Diagnosis present

## 2014-07-17 DIAGNOSIS — M810 Age-related osteoporosis without current pathological fracture: Secondary | ICD-10-CM

## 2014-07-17 DIAGNOSIS — M79605 Pain in left leg: Secondary | ICD-10-CM

## 2014-07-17 DIAGNOSIS — M79604 Pain in right leg: Secondary | ICD-10-CM

## 2014-07-17 LAB — COMPREHENSIVE METABOLIC PANEL
ALK PHOS: 57 U/L (ref 38–126)
ALT: 14 U/L (ref 14–54)
AST: 16 U/L (ref 15–41)
Albumin: 4.2 g/dL (ref 3.5–5.0)
Anion gap: 6 (ref 5–15)
BUN: 14 mg/dL (ref 6–20)
CALCIUM: 8.8 mg/dL — AB (ref 8.9–10.3)
CO2: 27 mmol/L (ref 22–32)
Chloride: 105 mmol/L (ref 101–111)
Creatinine, Ser: 0.73 mg/dL (ref 0.44–1.00)
GFR calc non Af Amer: >60 mL/min (ref >60)
GLUCOSE: 98 mg/dL (ref 65–99)
POTASSIUM: 3.7 mmol/L (ref 3.5–5.1)
SODIUM: 138 mmol/L (ref 135–145)
TOTAL PROTEIN: 7.8 g/dL (ref 6.5–8.1)
Total Bilirubin: 0.7 mg/dL (ref 0.3–1.2)

## 2014-07-17 LAB — CBC WITH DIFFERENTIAL/PLATELET
Basophils Absolute: 0.1 10*3/uL (ref 0.0–0.1)
Basophils Relative: 1 % (ref 0–1)
EOS ABS: 0.3 10*3/uL (ref 0.0–0.7)
Eosinophils Relative: 4 % (ref 0–5)
HCT: 41.4 % (ref 36.0–46.0)
Hemoglobin: 14 g/dL (ref 12.0–15.0)
LYMPHS PCT: 40 % (ref 12–46)
Lymphs Abs: 2.7 10*3/uL (ref 0.7–4.0)
MCH: 31.4 pg (ref 26.0–34.0)
MCHC: 33.8 g/dL (ref 30.0–36.0)
MCV: 92.8 fL (ref 78.0–100.0)
Monocytes Absolute: 0.5 10*3/uL (ref 0.1–1.0)
Monocytes Relative: 7 % (ref 3–12)
NEUTROS PCT: 48 % (ref 43–77)
Neutro Abs: 3.2 10*3/uL (ref 1.7–7.7)
PLATELETS: 302 10*3/uL (ref 150–400)
RBC: 4.46 MIL/uL (ref 3.87–5.11)
RDW: 13.7 % (ref 11.5–15.5)
WBC: 6.7 10*3/uL (ref 4.0–10.5)

## 2014-07-17 NOTE — Patient Instructions (Signed)
[place] at [facility] Discharge Instructions  RECOMMENDATIONS MADE BY THE CONSULTANT AND ANY TEST RESULTS WILL BE SENT TO YOUR REFERRING PHYSICIAN.  Exam and discussion by Robynn Pane, PA-C Will continue to monitor Continue the Calcium and Vitamin D Call with any concerns.  Follow-up with labs and office visit in 3 months.  Thank you for choosing [place] at [facility] to provide your oncology and hematology care.  To afford each patient quality time with our provider, please arrive at least 15 minutes before your scheduled appointment time.    You need to re-schedule your appointment should you arrive 10 or more minutes late.  We strive to give you quality time with our providers, and arriving late affects you and other patients whose appointments are after yours.  Also, if you no show three or more times for appointments you may be dismissed from the clinic at the providers discretion.     Again, thank you for choosing [facility].  Our hope is that these requests will decrease the amount of time that you wait before being seen by our physicians.       _____________________________________________________________  Should you have questions after your visit to [facility], please contact our office at [phone] between the hours of 8:30 a.m. and 4:30 p.m.  Voicemails left after 4:30 p.m. will not be returned until the following business day.  For prescription refill requests, have your pharmacy contact our office.

## 2014-07-17 NOTE — Assessment & Plan Note (Signed)
MGUS with bone marrow in March 2016 demonstrating 9% plasma cells with osteoporosis.  Labs today.  Labs in 12 weeks: CBC diff, CMET, MM panel.  Return in 12 weeks for follow-up.

## 2014-07-17 NOTE — Progress Notes (Signed)
[name]., MD [place] [address]  MGUS (monoclonal gammopathy of unknown significance) - Plan: Calcium Carbonate-Vitamin D (CALCIUM 600+D) 600-200 MG-UNIT TABS, Cholecalciferol (VITAMIN D-1000 MAX ST) 1000 UNITS tablet, CBC with Differential, Comprehensive metabolic panel, Multiple myeloma panel, serum, Kappa/lambda light chains  Osteoporosis  CURRENT THERAPY: Surveillance  INTERVAL HISTORY: Misty Richardson 59 y.o. female returns for followup of MGUS with bone marrow in March 2016 demonstrating 9% plasma cells with osteoporosis.  I personally reviewed and went over pathology results with the patient.  I personally reviewed and went over radiographic studies with the patient.  The results are noted within this dictation.    I personally reviewed and went over laboratory results with the patient.  The results are noted within this dictation.  Labs are stable, some are pending at the time of this dictation.  I have reviewed her MGUS dx with her and the fact that she does not, at this time, meet criteria for multiple myeloma and treatment.  I reviewed CRAB findings with her which we will monitor for.  She does have osteoporosis and was previously on Fosamax which she discontinued due to increased knee pain bilaterally.  She is disinterested in [place], etc.  "I am afraid of the side effects."  I reviewed categorical side effects of bisphosphonate and RANK-Ligand therapy, including hypocalcemia, and ONJ. She refuses any intervention.  I discussed the risks of that decision.  She would like to think about her options.   She continues to smoke.  "I don't smoke that much anymore."  She admits to smoking 1 - 1.5 ppd.  Past Medical History  Diagnosis Date  . COPD (chronic obstructive pulmonary disease)   . MGUS (monoclonal gammopathy of unknown significance) 09/05/2012  . COPD (chronic obstructive pulmonary disease) 09/05/2012  . Osteoporosis  03/14/2014    has MGUS (monoclonal gammopathy of unknown significance); COPD (chronic obstructive pulmonary disease); Tobacco use disorder; and Osteoporosis on her problem list.     is allergic to avelox.  Current Outpatient Prescriptions on File Prior to Visit  Medication Sig Dispense Refill  . albuterol (ACCUNEB) 1.25 MG/3ML nebulizer solution Take 1 ampule by nebulization every 6 (six) hours as needed. For shortness of breath    . albuterol (PROVENTIL HFA;VENTOLIN HFA) 108 (90 BASE) MCG/ACT inhaler Inhale 2 puffs into the lungs every 6 (six) hours as needed for wheezing or shortness of breath.    . ALPRAZolam (XANAX) 0.5 MG tablet Take 0.5 mg by mouth at bedtime. For anxiety    . budesonide-formoterol (SYMBICORT) 160-4.5 MCG/ACT inhaler Inhale 2 puffs into the lungs 2 (two) times daily. 1 Inhaler 1  . celecoxib (CELEBREX) 200 MG capsule as needed.  0  . cyclobenzaprine (FLEXERIL) 10 MG tablet Take 10 mg by mouth 3 (three) times daily as needed for muscle spasms.     Marland Kitchen alendronate (FOSAMAX) 70 MG tablet once a week.  1  . HYDROcodone-acetaminophen (NORCO/VICODIN) 5-325 MG per tablet Take 1 tablet by mouth every 6 (six) hours as needed. (Patient not taking: Reported on 04/16/2014) 10 tablet 0   No current facility-administered medications on file prior to visit.    Past Surgical History  Procedure Laterality Date  . Abdominal hysterectomy    . Appendectomy    . Cesarean section    . Bone biopsy Left 03/25/14  . Bone marrow aspiration Left 03/25/14  . Bone marrow biopsy Left March 2016    Denies any headaches,  dizziness, double vision, fevers, chills, night sweats, nausea, vomiting, diarrhea, constipation, chest pain, heart palpitations, shortness of breath, blood in stool, black tarry stool, urinary pain, urinary burning, urinary frequency, hematuria.   PHYSICAL EXAMINATION  ECOG PERFORMANCE STATUS: 1 - Symptomatic but completely ambulatory  Filed Vitals:   07/17/14 1401  BP: 108/62   Pulse: 64  Temp: 98.1 F (36.7 C)  Resp: 18    GENERAL:alert, no distress, well nourished, comfortable, cooperative, smiling and accompanied by mother. SKIN: skin color, texture, turgor are normal, no rashes or significant lesions HEAD: Normocephalic, No masses, lesions, tenderness or abnormalities EYES: normal, PERRLA, EOMI, Conjunctiva are pink and non-injected EARS: External ears normal OROPHARYNX:lips, buccal mucosa, and tongue normal and mucous membranes are moist  NECK: supple, no adenopathy, thyroid normal size, non-tender, without nodularity, trachea midline LYMPH:  not examined BREAST:not examined LUNGS: clear to auscultation , decreased breath sounds HEART: regular rate & rhythm, no murmurs, no gallops, S1 normal and S2 normal ABDOMEN:abdomen soft, non-tender and normal bowel sounds BACK: Back symmetric, no curvature., No CVA tenderness EXTREMITIES:less then 2 second capillary refill, no joint deformities, effusion, or inflammation, no skin discoloration, no cyanosis  NEURO: alert & oriented x 3 with fluent speech, no focal motor/sensory deficits, gait normal   LABORATORY DATA: CBC    Component Value Date/Time   WBC 6.7 07/17/2014 1255   RBC 4.46 07/17/2014 1255   HGB 14.0 07/17/2014 1255   HCT 41.4 07/17/2014 1255   PLT 302 07/17/2014 1255   MCV 92.8 07/17/2014 1255   MCH 31.4 07/17/2014 1255   MCHC 33.8 07/17/2014 1255   RDW 13.7 07/17/2014 1255   LYMPHSABS 2.7 07/17/2014 1255   MONOABS 0.5 07/17/2014 1255   EOSABS 0.3 07/17/2014 1255   BASOSABS 0.1 07/17/2014 1255      Chemistry      Component Value Date/Time   NA 138 07/17/2014 1255   K 3.7 07/17/2014 1255   CL 105 07/17/2014 1255   CO2 27 07/17/2014 1255   BUN 14 07/17/2014 1255   CREATININE 0.73 07/17/2014 1255   CREATININE 0.61 08/29/2008 1556      Component Value Date/Time   CALCIUM 8.8* 07/17/2014 1255   ALKPHOS 57 07/17/2014 1255   AST 16 07/17/2014 1255   ALT 14 07/17/2014 1255    BILITOT 0.7 07/17/2014 1255        ASSESSMENT AND PLAN:  MGUS (monoclonal gammopathy of unknown significance) MGUS with bone marrow in March 2016 demonstrating 9% plasma cells with osteoporosis.  Labs today.  Labs in 12 weeks: CBC diff, CMET, MM panel.  Return in 12 weeks for follow-up.  Osteoporosis Currently on calcium and Vit D.  Previously on Fosamax which the patient discontinued due to intolerance (knee/leg pain).    Discussion regarding Reclast, Prolia, etc.  She declines any intervention at this time.  I reviewed the risks of this decision.  We discussed alternatives.  She has agreed to continue Ca++ and Vit D for the time being.  Hopefully, we can convince her in the future that she needs more aggressive treatment for osteoporosis and we can start therapy.    THERAPY PLAN:  Continue surveillance for CRAB findings.   C= Calcium increase of greater than 11.5 mg/dL R = Renal insufficiency with creatinine > 2 mg/dL A= Anemia with Hgb < 10 g/dL B= Bone disease with lytic lesions and/or osteopenia   All questions were answered. The patient knows to call the clinic with any problems, questions or concerns. We  can certainly see the patient much sooner if necessary.  Patient and plan discussed with Dr. Ancil Linsey and she is in agreement with the aforementioned.   This note is electronically signed by: Doy Mince 07/17/2014 3:33 PM

## 2014-07-17 NOTE — Assessment & Plan Note (Signed)
Currently on calcium and Vit D.  Previously on Fosamax which the patient discontinued due to intolerance (knee/leg pain).    Discussion regarding Reclast, Prolia, etc.  She declines any intervention at this time.  I reviewed the risks of this decision.  We discussed alternatives.  She has agreed to continue Ca++ and Vit D for the time being.  Hopefully, we can convince her in the future that she needs more aggressive treatment for osteoporosis and we can start therapy.

## 2014-07-18 LAB — MULTIPLE MYELOMA PANEL, SERUM
ALBUMIN SERPL ELPH-MCNC: 3.9 g/dL (ref 2.9–4.4)
ALPHA 1: 0.3 g/dL (ref 0.0–0.4)
Albumin/Glob SerPl: 1.2 (ref 0.7–1.7)
Alpha2 Glob SerPl Elph-Mcnc: 0.7 g/dL (ref 0.4–1.0)
B-Globulin SerPl Elph-Mcnc: 0.9 g/dL (ref 0.7–1.3)
GAMMA GLOB SERPL ELPH-MCNC: 1.6 g/dL (ref 0.4–1.8)
GLOBULIN, TOTAL: 3.4 g/dL (ref 2.2–3.9)
IGG (IMMUNOGLOBIN G), SERUM: 1789 mg/dL — AB (ref 700–1600)
IgA: 34 mg/dL — ABNORMAL LOW (ref 87–352)
IgM, Serum: 38 mg/dL (ref 26–217)
M PROTEIN SERPL ELPH-MCNC: 1.4 g/dL — AB
TOTAL PROTEIN ELP: 7.3 g/dL (ref 6.0–8.5)

## 2014-07-18 LAB — KAPPA/LAMBDA LIGHT CHAINS
Kappa free light chain: 229.69 mg/L — ABNORMAL HIGH (ref 3.30–19.40)
Kappa, lambda light chain ratio: 58.59 — ABNORMAL HIGH (ref 0.26–1.65)
Lambda free light chains: 3.92 mg/L — ABNORMAL LOW (ref 5.71–26.30)

## 2014-07-18 NOTE — Progress Notes (Signed)
LABS DRAWN

## 2014-10-17 ENCOUNTER — Other Ambulatory Visit (HOSPITAL_COMMUNITY): Payer: Medicare Other

## 2014-10-17 ENCOUNTER — Ambulatory Visit (HOSPITAL_COMMUNITY): Payer: Medicare Other | Admitting: Hematology & Oncology

## 2014-11-06 ENCOUNTER — Encounter (HOSPITAL_BASED_OUTPATIENT_CLINIC_OR_DEPARTMENT_OTHER): Payer: Medicare Other

## 2014-11-06 ENCOUNTER — Encounter (HOSPITAL_COMMUNITY): Payer: Medicare Other | Attending: Hematology & Oncology | Admitting: Hematology & Oncology

## 2014-11-06 ENCOUNTER — Encounter (HOSPITAL_COMMUNITY): Payer: Self-pay | Admitting: Hematology & Oncology

## 2014-11-06 VITALS — BP 93/64 | HR 95 | Temp 98.2°F | Resp 16 | Wt 91.4 lb

## 2014-11-06 DIAGNOSIS — M810 Age-related osteoporosis without current pathological fracture: Secondary | ICD-10-CM | POA: Diagnosis not present

## 2014-11-06 DIAGNOSIS — D472 Monoclonal gammopathy: Secondary | ICD-10-CM

## 2014-11-06 LAB — CBC WITH DIFFERENTIAL/PLATELET
Basophils Absolute: 0.1 10*3/uL (ref 0.0–0.1)
Basophils Relative: 1 %
Eosinophils Absolute: 0.4 10*3/uL (ref 0.0–0.7)
Eosinophils Relative: 6 %
HEMATOCRIT: 40.5 % (ref 36.0–46.0)
Hemoglobin: 13.7 g/dL (ref 12.0–15.0)
LYMPHS ABS: 2.2 10*3/uL (ref 0.7–4.0)
LYMPHS PCT: 32 %
MCH: 31.5 pg (ref 26.0–34.0)
MCHC: 33.8 g/dL (ref 30.0–36.0)
MCV: 93.1 fL (ref 78.0–100.0)
MONO ABS: 0.5 10*3/uL (ref 0.1–1.0)
Monocytes Relative: 8 %
NEUTROS ABS: 3.5 10*3/uL (ref 1.7–7.7)
Neutrophils Relative %: 53 %
Platelets: 308 10*3/uL (ref 150–400)
RBC: 4.35 MIL/uL (ref 3.87–5.11)
RDW: 13.1 % (ref 11.5–15.5)
WBC: 6.7 10*3/uL (ref 4.0–10.5)

## 2014-11-06 LAB — COMPREHENSIVE METABOLIC PANEL
ALK PHOS: 58 U/L (ref 38–126)
ALT: 11 U/L — ABNORMAL LOW (ref 14–54)
ANION GAP: 5 (ref 5–15)
AST: 19 U/L (ref 15–41)
Albumin: 4.2 g/dL (ref 3.5–5.0)
BUN: 11 mg/dL (ref 6–20)
CO2: 24 mmol/L (ref 22–32)
Calcium: 9.5 mg/dL (ref 8.9–10.3)
Chloride: 109 mmol/L (ref 101–111)
Creatinine, Ser: 0.67 mg/dL (ref 0.44–1.00)
GFR calc non Af Amer: >60 mL/min (ref >60)
Glucose, Bld: 116 mg/dL — ABNORMAL HIGH (ref 65–99)
Potassium: 3.5 mmol/L (ref 3.5–5.1)
SODIUM: 138 mmol/L (ref 135–145)
Total Bilirubin: 0.6 mg/dL (ref 0.3–1.2)
Total Protein: 8 g/dL (ref 6.5–8.1)

## 2014-11-06 NOTE — Progress Notes (Signed)
Misty Richardson., MD [place] [address]  MGUS (monoclonal gammopathy of unknown significance) - Plan: buPROPion (WELLBUTRIN XL) 150 MG 24 hr tablet, SPIRIVA HANDIHALER 18 MCG inhalation capsule, CBC with Differential, Comprehensive metabolic panel, Kappa/lambda light chains, Beta 2 microglobuline, serum, IgG, IgA, IgM, Immunofixation electrophoresis, Protein electrophoresis, serum   CURRENT THERAPY: Observation  INTERVAL HISTORY: Misty Richardson 59 y.o. female returns for followup of MGUS with 3% to 7% plasma cells. Her myeloma markers were slowly increasing and her osteoporosis has shown worsening. She recently underwent a repeat BMBX with 9% plasma cells.   Misty Richardson is here today with her mother.  She has had her flu vaccine. Her main concern at the start of the appointment was with regards to the Prolia shot or bisphosphonate therapy.  Misty Richardson had her last mammogram in February.  She denies anything new or out of the ordinary in terms of pain or symptoms.   Past Medical History  Diagnosis Date  . COPD (chronic obstructive pulmonary disease) ([place])   . MGUS (monoclonal gammopathy of unknown significance) 09/05/2012  . COPD (chronic obstructive pulmonary disease) ([place]) 09/05/2012  . Osteoporosis 03/14/2014    has MGUS (monoclonal gammopathy of unknown significance); COPD (chronic obstructive pulmonary disease) ([place]); Tobacco use disorder; and Osteoporosis on her problem list.     is allergic to avelox.  Misty Richardson does not currently have medications on file.  Past Surgical History  Procedure Laterality Date  . Abdominal hysterectomy    . Appendectomy    . Cesarean section    . Bone biopsy Left 03/25/14  . Bone marrow aspiration Left 03/25/14  . Bone marrow biopsy Left March 2016   Denies any headaches, dizziness, double vision, fevers, chills, night sweats, nausea, vomiting, diarrhea, constipation, chest pain, heart palpitations,  shortness of breath, blood in stool, black tarry stool, urinary pain, urinary burning, urinary frequency, hematuria.  14 point review of systems was performed and is negative except as detailed under history of present illness and above  PHYSICAL EXAMINATION  ECOG PERFORMANCE STATUS: 0 - Asymptomatic  Filed Vitals:   11/06/14 1250  BP: 93/64  Pulse: 95  Temp: 98.2 F (36.8 C)  Resp: 16    GENERAL:alert, no distress, well nourished, well developed, comfortable, cooperative, smiling and looks older than her stated age, accompanied by her mother and mentally handicapped son. SKIN: skin color, texture, turgor are normal, no rashes or significant lesions, thickened facial skin HEAD: Normocephalic, No masses, lesions, tenderness or abnormalities EYES: normal, PERRLA, EOMI, Conjunctiva are pink and non-injected EARS: External ears normal OROPHARYNX:lips, buccal mucosa, and tongue normal and mucous membranes are moist  NECK: supple, trachea midline LYMPH: no palpable LAD BREAST:not examined LUNGS: CTAB no wheezing or rhonchi HEART: S1/S2 audible and regular ABDOMEN:soft/NT/ND without rebound or guarding BACK: Back symmetric, no curvature. EXTREMITIES:less then 2 second capillary refill, no joint deformities, effusion, or inflammation, no skin discoloration, no cyanosis  NEURO: alert & oriented x 3 with fluent speech, no focal motor/sensory deficits, gait normal   LABORATORY DATA: I have reviewed the data as listed  CBC    Component Value Date/Time   WBC 6.7 11/06/2014 1250   RBC 4.35 11/06/2014 1250   HGB 13.7 11/06/2014 1250   HCT 40.5 11/06/2014 1250   PLT 308 11/06/2014 1250   MCV 93.1 11/06/2014 1250   MCH 31.5 11/06/2014 1250   MCHC 33.8 11/06/2014 1250   RDW 13.1 11/06/2014 1250  LYMPHSABS 2.2 11/06/2014 1250   MONOABS 0.5 11/06/2014 1250   EOSABS 0.4 11/06/2014 1250   BASOSABS 0.1 11/06/2014 1250      Chemistry      Component Value Date/Time   NA 138  07/17/2014 1255   K 3.7 07/17/2014 1255   CL 105 07/17/2014 1255   CO2 27 07/17/2014 1255   BUN 14 07/17/2014 1255   CREATININE 0.73 07/17/2014 1255   CREATININE 0.61 08/29/2008 1556      Component Value Date/Time   CALCIUM 8.8* 07/17/2014 1255   ALKPHOS 57 07/17/2014 1255   AST 16 07/17/2014 1255   ALT 14 07/17/2014 1255   BILITOT 0.7 07/17/2014 1255     PATHOLOGY:  FINAL DIAGNOSIS Diagnosis Bone Marrow, Aspirate,Biopsy, and Clot - 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 - TRILINEAGE HEMATOPOIESIS WITH MEGAKARYOCYTIC PROLIFERATION. - SEE COMMENT. PERIPHERAL BLOOD: - THROMBOCYTOSIS. Diagnosis Note The bone marrow shows increased number of plasma cells representing 9% of all cells in the aspirate associated with several small clusters in the clot sections. Immunohistochemical stains show that the plasma cells are kappa light chain restricted, consistent with plasma cell neoplasm. Correlation with cytogenetic and FISH studies is recommended. The background shows trilineage hematopoiesis with progressive maturation. Megakaryocytes in particular are increased in number with scattered large atypical and hyperchromatic forms. The latter finding is of uncertain significance at this time, but if thrombocytosis is persistent or progressive , JAK-2 mutation analysis is recommended. Misty Greenhouse MD Pathologist, Electronic Signature (Case signed 03/27/2014)    FISH and cytogenetics are pending    ASSESSMENT and THERAPY PLAN:  IgG kappa MGUS MGUS with 9% plasma cells on BMBX Osteoporosis Normal CBC, normal kidney function Abnormal kappa/lambda light chain ratio/M-spike 1.2 gm/dl Bone Survey 03/25/2014 with stable osteopenia/stable chronic L tenth rib FX  I have recommended therapy for her osteoporosis. Ideally a bisphosphonate given her underlying plasma cell dyscrasia.  She continues to be hesitant to try any therapy.  We will continue with close  observation. I do have concerns about her disease long term.  I have discussed this with her.  All questions were answered. The patient knows to call the clinic with any problems, questions or concerns. We can certainly see the patient much sooner if necessary.  This document serves as a record of services personally performed by Ancil Linsey, MD. It was created on her behalf by Toni Amend, a trained medical scribe. The creation of this record is based on the scribe's personal observations and the provider's statements to them. This document has been checked and approved by the attending provider.  I have reviewed the above documentation for accuracy and completeness, and I agree with the above. Molli Hazard, MD  11/06/2014  This note was electronically signed

## 2014-11-06 NOTE — Patient Instructions (Signed)
[place] at [facility] Discharge Instructions  RECOMMENDATIONS MADE BY THE CONSULTANT AND ANY TEST RESULTS WILL BE SENT TO YOUR REFERRING PHYSICIAN.  Exam and discussion by Dr. Whitney Muse. If any concerns with your labs we will contact you. Call with any concerns.  Labs and office visit in 3 months.  Thank you for choosing [place] at [facility] to provide your oncology and hematology care.  To afford each patient quality time with our provider, please arrive at least 15 minutes before your scheduled appointment time.    You need to re-schedule your appointment should you arrive 10 or more minutes late.  We strive to give you quality time with our providers, and arriving late affects you and other patients whose appointments are after yours.  Also, if you no show three or more times for appointments you may be dismissed from the clinic at the providers discretion.     Again, thank you for choosing [facility].  Our hope is that these requests will decrease the amount of time that you wait before being seen by our physicians.       _____________________________________________________________  Should you have questions after your visit to [facility], please contact our office at [phone] between the hours of 8:30 a.m. and 4:30 p.m.  Voicemails left after 4:30 p.m. will not be returned until the following business day.  For prescription refill requests, have your pharmacy contact our office.

## 2014-11-07 LAB — KAPPA/LAMBDA LIGHT CHAINS
Kappa free light chain: 244.19 mg/L — ABNORMAL HIGH (ref 3.30–19.40)
Kappa, lambda light chain ratio: 62.29 — ABNORMAL HIGH (ref 0.26–1.65)
Lambda free light chains: 3.92 mg/L — ABNORMAL LOW (ref 5.71–26.30)

## 2014-11-08 NOTE — Progress Notes (Signed)
LABS DRAWN

## 2014-11-11 LAB — MULTIPLE MYELOMA PANEL, SERUM
Albumin SerPl Elph-Mcnc: 3.8 g/dL (ref 2.9–4.4)
Albumin/Glob SerPl: 1.2 (ref 0.7–1.7)
Alpha 1: 0.2 g/dL (ref 0.0–0.4)
Alpha2 Glob SerPl Elph-Mcnc: 0.7 g/dL (ref 0.4–1.0)
B-Globulin SerPl Elph-Mcnc: 0.8 g/dL (ref 0.7–1.3)
Gamma Glob SerPl Elph-Mcnc: 1.7 g/dL (ref 0.4–1.8)
Globulin, Total: 3.4 g/dL (ref 2.2–3.9)
IgA: 27 mg/dL — ABNORMAL LOW (ref 87–352)
IgG (Immunoglobin G), Serum: 1837 mg/dL — ABNORMAL HIGH (ref 700–1600)
IgM, Serum: 32 mg/dL (ref 26–217)
M Protein SerPl Elph-Mcnc: 1.2 g/dL — ABNORMAL HIGH
TOTAL PROTEIN ELP: 7.2 g/dL (ref 6.0–8.5)

## 2014-12-08 ENCOUNTER — Encounter (HOSPITAL_COMMUNITY): Payer: Self-pay | Admitting: Hematology & Oncology

## 2014-12-18 ENCOUNTER — Other Ambulatory Visit (HOSPITAL_COMMUNITY): Payer: Self-pay | Admitting: Oncology

## 2015-01-09 ENCOUNTER — Ambulatory Visit (HOSPITAL_COMMUNITY)
Admission: RE | Admit: 2015-01-09 | Discharge: 2015-01-09 | Disposition: A | Discharge status: Home / Self Care | Payer: Medicare Other | Source: Ambulatory Visit | Attending: Internal Medicine | Admitting: Internal Medicine

## 2015-01-09 ENCOUNTER — Other Ambulatory Visit (HOSPITAL_COMMUNITY): Payer: Self-pay | Admitting: Internal Medicine

## 2015-01-09 DIAGNOSIS — M5440 Lumbago with sciatica, unspecified side: Secondary | ICD-10-CM

## 2015-01-09 DIAGNOSIS — M545 Low back pain: Secondary | ICD-10-CM | POA: Diagnosis present

## 2015-02-06 ENCOUNTER — Encounter (HOSPITAL_COMMUNITY): Payer: Medicare Other

## 2015-02-06 ENCOUNTER — Encounter (HOSPITAL_COMMUNITY): Payer: Self-pay | Admitting: Oncology

## 2015-02-06 ENCOUNTER — Ambulatory Visit (HOSPITAL_COMMUNITY)
Admission: RE | Admit: 2015-02-06 | Discharge: 2015-02-06 | Disposition: A | Discharge status: Home / Self Care | Payer: Medicare Other | Source: Ambulatory Visit | Attending: Oncology | Admitting: Oncology

## 2015-02-06 ENCOUNTER — Encounter (HOSPITAL_COMMUNITY): Payer: Medicare Other | Attending: Oncology | Admitting: Oncology

## 2015-02-06 ENCOUNTER — Other Ambulatory Visit (HOSPITAL_COMMUNITY): Payer: Self-pay | Admitting: Oncology

## 2015-02-06 VITALS — BP 108/66 | HR 91 | Temp 98.0°F | Resp 20 | Wt 94.4 lb

## 2015-02-06 DIAGNOSIS — D472 Monoclonal gammopathy: Secondary | ICD-10-CM

## 2015-02-06 DIAGNOSIS — F17200 Nicotine dependence, unspecified, uncomplicated: Secondary | ICD-10-CM | POA: Diagnosis not present

## 2015-02-06 DIAGNOSIS — M810 Age-related osteoporosis without current pathological fracture: Secondary | ICD-10-CM | POA: Insufficient documentation

## 2015-02-06 DIAGNOSIS — M4854XA Collapsed vertebra, not elsewhere classified, thoracic region, initial encounter for fracture: Secondary | ICD-10-CM | POA: Diagnosis not present

## 2015-02-06 LAB — CBC WITH DIFFERENTIAL/PLATELET
BASOS ABS: 0.1 10*3/uL (ref 0.0–0.1)
BASOS PCT: 1 %
EOS ABS: 0.5 10*3/uL (ref 0.0–0.7)
EOS PCT: 7 %
HCT: 37.5 % (ref 36.0–46.0)
Hemoglobin: 12.6 g/dL (ref 12.0–15.0)
LYMPHS PCT: 22 %
Lymphs Abs: 1.6 10*3/uL (ref 0.7–4.0)
MCH: 30.8 pg (ref 26.0–34.0)
MCHC: 33.6 g/dL (ref 30.0–36.0)
MCV: 91.7 fL (ref 78.0–100.0)
Monocytes Absolute: 0.5 10*3/uL (ref 0.1–1.0)
Monocytes Relative: 7 %
Neutro Abs: 4.7 10*3/uL (ref 1.7–7.7)
Neutrophils Relative %: 63 %
PLATELETS: 314 10*3/uL (ref 150–400)
RBC: 4.09 MIL/uL (ref 3.87–5.11)
RDW: 12.8 % (ref 11.5–15.5)
WBC: 7.4 10*3/uL (ref 4.0–10.5)

## 2015-02-06 LAB — COMPREHENSIVE METABOLIC PANEL
ALBUMIN: 4.3 g/dL (ref 3.5–5.0)
ALT: 20 U/L (ref 14–54)
AST: 25 U/L (ref 15–41)
Alkaline Phosphatase: 92 U/L (ref 38–126)
Anion gap: 7 (ref 5–15)
BUN: 15 mg/dL (ref 6–20)
CALCIUM: 9.7 mg/dL (ref 8.9–10.3)
CHLORIDE: 105 mmol/L (ref 101–111)
CO2: 27 mmol/L (ref 22–32)
Creatinine, Ser: 0.76 mg/dL (ref 0.44–1.00)
GFR calc Af Amer: >60 mL/min (ref >60)
GFR calc non Af Amer: >60 mL/min (ref >60)
Glucose, Bld: 96 mg/dL (ref 65–99)
POTASSIUM: 4 mmol/L (ref 3.5–5.1)
SODIUM: 139 mmol/L (ref 135–145)
TOTAL PROTEIN: 8 g/dL (ref 6.5–8.1)
Total Bilirubin: 0.5 mg/dL (ref 0.3–1.2)

## 2015-02-06 NOTE — Assessment & Plan Note (Addendum)
Progressive osteoporosis.  Currently on calcium and Vit D.  Previously on Fosamax which the patient discontinued due to intolerance (knee/leg pain).    Discussion regarding bisphosphonate therapy.  She declines any IV intervention at this time, but admits that she may be willing to re-try Fosamax which she took in the past.  She notes that she stopped Fosamax because she thought it increased her bone/joint pain.  She may try this again.  She thinks she has some at home and she may start this medication.  She is advised to call us if the Rx is expired and I would be happy to Rx.  If she has some at home, and wants to start Fosamax, she is to call us to let us know for documentation sake.  She has agreed to continue Ca++ and Vit D.  Labs today: Vit D.  Addendum: Patient called the clinic requesting a refill on Fosamax today, 02/06/2014.  She was given a month supply with 1 refill for the time being.
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
## 2015-02-06 NOTE — Assessment & Plan Note (Addendum)
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  CBC is intact without anemia, renal function is WNL, and no hypercalcemia.  Labs today as ordered.  Labs in 12 weeks: CBC diff, CMET, LDH, ESR, CRP, B2M, MM panel.  Return in 12 weeks for follow-up.  She requests a thoracic xray secondary to pain in her mid-back since falling.  In the setting of her osteopenia and recent sacral fracture, xray would be reasonable.  Xray demonstrates a T7 fx, age indeterminate.  I will send a copy to her primary care provider for further evaluation and appropriate referring as she is currently being referred for sacral fracture and she would be best served with a single orthopod or group for management.  I do not know where she is being referred to and therefore, I will defer to primary care provider.

## 2015-02-06 NOTE — Patient Instructions (Signed)
[place] at [facility] Discharge Instructions  RECOMMENDATIONS MADE BY THE CONSULTANT AND ANY TEST RESULTS WILL BE SENT TO YOUR REFERRING PHYSICIAN.  Exam done and seen by Kirby Crigler PA-C today Labs in 82months and follow up with Dr.Penland Need xray done on your back today.  Thank you for choosing [place] at [facility] to provide your oncology and hematology care.  To afford each patient quality time with our provider, please arrive at least 15 minutes before your scheduled appointment time.   Beginning January 23rd 2017 lab work for the [facility] will be done in the  Main lab at [facility] on 1st floor. If you have a lab appointment with the [facility] please come in thru the  Main Entrance and check in at the main information desk  You need to re-schedule your appointment should you arrive 10 or more minutes late.  We strive to give you quality time with our providers, and arriving late affects you and other patients whose appointments are after yours.  Also, if you no show three or more times for appointments you may be dismissed from the clinic at the providers discretion.     Again, thank you for choosing [facility].  Our hope is that these requests will decrease the amount of time that you wait before being seen by our physicians.       _____________________________________________________________  Should you have questions after your visit to [facility], please contact our office at [phone] between the hours of 8:30 a.m. and 4:30 p.m.  Voicemails left after 4:30 p.m. will not be returned until the following business day.  For prescription refill requests, have your pharmacy contact our office.
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
## 2015-02-07 ENCOUNTER — Telehealth (HOSPITAL_COMMUNITY): Payer: Self-pay | Admitting: *Deleted

## 2015-02-07 ENCOUNTER — Other Ambulatory Visit (HOSPITAL_COMMUNITY): Payer: Self-pay | Admitting: Oncology

## 2015-02-07 ENCOUNTER — Other Ambulatory Visit (HOSPITAL_COMMUNITY): Payer: Self-pay

## 2015-02-07 DIAGNOSIS — M810 Age-related osteoporosis without current pathological fracture: Secondary | ICD-10-CM

## 2015-02-07 LAB — PROTEIN ELECTROPHORESIS, SERUM
A/G Ratio: 1 (ref 0.7–1.7)
ALPHA-1-GLOBULIN: 0.3 g/dL (ref 0.0–0.4)
ALPHA-2-GLOBULIN: 0.8 g/dL (ref 0.4–1.0)
Albumin ELP: 3.7 g/dL (ref 2.9–4.4)
Beta Globulin: 0.9 g/dL (ref 0.7–1.3)
GAMMA GLOBULIN: 1.8 g/dL (ref 0.4–1.8)
Globulin, Total: 3.8 g/dL (ref 2.2–3.9)
M-Spike, %: 1.5 g/dL — ABNORMAL HIGH
TOTAL PROTEIN ELP: 7.5 g/dL (ref 6.0–8.5)

## 2015-02-07 LAB — IGG, IGA, IGM
IGG (IMMUNOGLOBIN G), SERUM: 2000 mg/dL — AB (ref 700–1600)
IgA: 24 mg/dL — ABNORMAL LOW (ref 87–352)
IgM, Serum: 32 mg/dL (ref 26–217)

## 2015-02-07 LAB — KAPPA/LAMBDA LIGHT CHAINS
KAPPA FREE LGHT CHN: 365.54 mg/L — AB (ref 3.30–19.40)
Kappa, lambda light chain ratio: 89.37 — ABNORMAL HIGH (ref 0.26–1.65)
LAMDA FREE LIGHT CHAINS: 4.09 mg/L — AB (ref 5.71–26.30)

## 2015-02-07 LAB — VITAMIN D 25 HYDROXY (VIT D DEFICIENCY, FRACTURES): Vit D, 25-Hydroxy: 31.6 ng/mL (ref 30.0–100.0)

## 2015-02-07 LAB — BETA 2 MICROGLOBULIN, SERUM: BETA 2 MICROGLOBULIN: 2.4 mg/L (ref 0.6–2.4)

## 2015-02-07 MED ORDER — ALENDRONATE SODIUM 70 MG PO TABS: 70.0000 mg | ORAL_TABLET | ORAL | Status: DC

## 2015-02-10 LAB — IMMUNOFIXATION ELECTROPHORESIS
IGA: 24 mg/dL — AB (ref 87–352)
IGM, SERUM: 32 mg/dL (ref 26–217)
IgG (Immunoglobin G), Serum: 1937 mg/dL — ABNORMAL HIGH (ref 700–1600)
Total Protein ELP: 7.4 g/dL (ref 6.0–8.5)

## 2015-03-25 ENCOUNTER — Other Ambulatory Visit (HOSPITAL_COMMUNITY): Payer: Self-pay | Admitting: Oncology

## 2015-03-25 DIAGNOSIS — M810 Age-related osteoporosis without current pathological fracture: Secondary | ICD-10-CM

## 2015-03-25 MED ORDER — ALENDRONATE SODIUM 70 MG PO TABS: 70.0000 mg | ORAL_TABLET | ORAL | Status: DC

## 2015-04-24 ENCOUNTER — Encounter (HOSPITAL_COMMUNITY): Payer: Medicare Other | Attending: Oncology | Admitting: Hematology & Oncology

## 2015-04-24 ENCOUNTER — Encounter (HOSPITAL_COMMUNITY): Payer: Medicare Other

## 2015-04-24 ENCOUNTER — Encounter (HOSPITAL_COMMUNITY): Payer: Self-pay | Admitting: Hematology & Oncology

## 2015-04-24 VITALS — BP 112/81 | HR 80 | Temp 98.1°F | Resp 18 | Wt 92.8 lb

## 2015-04-24 DIAGNOSIS — M810 Age-related osteoporosis without current pathological fracture: Secondary | ICD-10-CM | POA: Diagnosis not present

## 2015-04-24 DIAGNOSIS — D472 Monoclonal gammopathy: Secondary | ICD-10-CM

## 2015-04-24 DIAGNOSIS — Z720 Tobacco use: Secondary | ICD-10-CM

## 2015-04-24 DIAGNOSIS — F17200 Nicotine dependence, unspecified, uncomplicated: Secondary | ICD-10-CM

## 2015-04-24 LAB — C-REACTIVE PROTEIN: CRP: 1.1 mg/dL — AB (ref <1.0)

## 2015-04-24 LAB — CBC WITH DIFFERENTIAL/PLATELET
Basophils Absolute: 0 10*3/uL (ref 0.0–0.1)
Basophils Relative: 1 %
EOS ABS: 0.4 10*3/uL (ref 0.0–0.7)
Eosinophils Relative: 5 %
HEMATOCRIT: 39.3 % (ref 36.0–46.0)
HEMOGLOBIN: 13.4 g/dL (ref 12.0–15.0)
LYMPHS ABS: 2.4 10*3/uL (ref 0.7–4.0)
LYMPHS PCT: 33 %
MCH: 31 pg (ref 26.0–34.0)
MCHC: 34.1 g/dL (ref 30.0–36.0)
MCV: 91 fL (ref 78.0–100.0)
MONOS PCT: 7 %
Monocytes Absolute: 0.5 10*3/uL (ref 0.1–1.0)
NEUTROS PCT: 54 %
Neutro Abs: 4 10*3/uL (ref 1.7–7.7)
Platelets: 329 10*3/uL (ref 150–400)
RBC: 4.32 MIL/uL (ref 3.87–5.11)
RDW: 12.9 % (ref 11.5–15.5)
WBC: 7.3 10*3/uL (ref 4.0–10.5)

## 2015-04-24 LAB — COMPREHENSIVE METABOLIC PANEL WITH GFR
ALT: 18 U/L (ref 14–54)
AST: 22 U/L (ref 15–41)
Albumin: 4.4 g/dL (ref 3.5–5.0)
Alkaline Phosphatase: 61 U/L (ref 38–126)
Anion gap: 6 (ref 5–15)
BUN: 14 mg/dL (ref 6–20)
CO2: 24 mmol/L (ref 22–32)
Calcium: 9.7 mg/dL (ref 8.9–10.3)
Chloride: 108 mmol/L (ref 101–111)
Creatinine, Ser: 0.69 mg/dL (ref 0.44–1.00)
GFR calc Af Amer: >60 mL/min
GFR calc non Af Amer: >60 mL/min
Glucose, Bld: 89 mg/dL (ref 65–99)
Potassium: 3.9 mmol/L (ref 3.5–5.1)
Sodium: 138 mmol/L (ref 135–145)
Total Bilirubin: 0.4 mg/dL (ref 0.3–1.2)
Total Protein: 8.2 g/dL — ABNORMAL HIGH (ref 6.5–8.1)

## 2015-04-24 LAB — LACTATE DEHYDROGENASE: LDH: 108 U/L (ref 98–192)

## 2015-04-24 LAB — SEDIMENTATION RATE: Sed Rate: 19 mm/h (ref 0–22)

## 2015-04-24 NOTE — Patient Instructions (Addendum)
[place] at [facility] Discharge Instructions  RECOMMENDATIONS MADE BY THE CONSULTANT AND ANY TEST RESULTS WILL BE SENT TO YOUR REFERRING PHYSICIAN.   Exam and discussion by Dr Whitney Muse today Blood counts look good, other labs are pending. Get a skeletal survey next week. Continue calcium, vit. D, fosamax Will get a urine sample, bring it back when you do your scans. Return to see the doctor in 3 months Please call the clinic if you have any questions or concerns     Thank you for choosing [place] at [facility] to provide your oncology and hematology care.  To afford each patient quality time with our provider, please arrive at least 15 minutes before your scheduled appointment time.   Beginning January 23rd 2017 lab work for the [facility] will be done in the  Main lab at [facility] on 1st floor. If you have a lab appointment with the [facility] please come in thru the  Main Entrance and check in at the main information desk  You need to re-schedule your appointment should you arrive 10 or more minutes late.  We strive to give you quality time with our providers, and arriving late affects you and other patients whose appointments are after yours.  Also, if you no show three or more times for appointments you may be dismissed from the clinic at the providers discretion.     Again, thank you for choosing [facility].  Our hope is that these requests will decrease the amount of time that you wait before being seen by our physicians.       _____________________________________________________________  Should you have questions after your visit to [facility], please contact our office at [phone] between the hours of 8:30 a.m. and 4:30 p.m.  Voicemails left after 4:30 p.m. will not be returned until the following business day.  For prescription refill requests, have your pharmacy contact our office.          Resources For Cancer Patients and their Caregivers ? American Cancer Society: Can assist with transportation, wigs, general needs, runs Look Good Feel Better.        772-233-1436 ? Cancer Care: Provides financial assistance, online support groups, medication/co-pay assistance.  1-800-813-HOPE 367 477 7668) ? [facility] Assists [place] cancer patients and their families through emotional , educational and financial support.  2603583273 ? [place] DSS Where to apply for food stamps, Medicaid and utility assistance. 270 182 9429 ? [facility]: Transportation to medical appointments. 639-150-5742 ? Social Security Administration: May apply for disability if have a Stage IV cancer. (339)638-3817 [phone] ? [employer], Disability and Transit Services: Assists with nutrition, care and transit needs. 612-066-4689
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
## 2015-04-25 LAB — MULTIPLE MYELOMA PANEL, SERUM
ALBUMIN SERPL ELPH-MCNC: 3.7 g/dL (ref 2.9–4.4)
Albumin/Glob SerPl: 1.1 (ref 0.7–1.7)
Alpha 1: 0.3 g/dL (ref 0.0–0.4)
Alpha2 Glob SerPl Elph-Mcnc: 0.7 g/dL (ref 0.4–1.0)
B-GLOBULIN SERPL ELPH-MCNC: 0.9 g/dL (ref 0.7–1.3)
Gamma Glob SerPl Elph-Mcnc: 1.8 g/dL (ref 0.4–1.8)
Globulin, Total: 3.7 g/dL (ref 2.2–3.9)
IGA: 30 mg/dL — AB (ref 87–352)
IgG (Immunoglobin G), Serum: 1980 mg/dL — ABNORMAL HIGH (ref 700–1600)
IgM, Serum: 40 mg/dL (ref 26–217)
M PROTEIN SERPL ELPH-MCNC: 1.5 g/dL — AB
TOTAL PROTEIN ELP: 7.4 g/dL (ref 6.0–8.5)

## 2015-04-25 LAB — PROTEIN ELECTROPHORESIS, SERUM
A/G Ratio: 1.1 (ref 0.7–1.7)
Albumin ELP: 3.9 g/dL (ref 2.9–4.4)
Alpha-1-Globulin: 0.2 g/dL (ref 0.0–0.4)
Alpha-2-Globulin: 0.7 g/dL (ref 0.4–1.0)
BETA GLOBULIN: 0.9 g/dL (ref 0.7–1.3)
GAMMA GLOBULIN: 1.8 g/dL (ref 0.4–1.8)
Globulin, Total: 3.7 g/dL (ref 2.2–3.9)
M-SPIKE, %: 1.6 g/dL — AB
Total Protein ELP: 7.6 g/dL (ref 6.0–8.5)

## 2015-04-25 LAB — KAPPA/LAMBDA LIGHT CHAINS
KAPPA, LAMDA LIGHT CHAIN RATIO: 42.02 — AB (ref 0.26–1.65)
Kappa free light chain: 312.61 mg/L — ABNORMAL HIGH (ref 3.30–19.40)
Lambda free light chains: 7.44 mg/L (ref 5.71–26.30)

## 2015-04-25 LAB — IGG, IGA, IGM
IGA: 30 mg/dL — AB (ref 87–352)
IGG (IMMUNOGLOBIN G), SERUM: 2105 mg/dL — AB (ref 700–1600)
IGM, SERUM: 42 mg/dL (ref 26–217)

## 2015-04-25 LAB — BETA 2 MICROGLOBULIN, SERUM: BETA 2 MICROGLOBULIN: 1.9 mg/L (ref 0.6–2.4)

## 2015-04-28 LAB — IMMUNOFIXATION ELECTROPHORESIS
IGM, SERUM: 42 mg/dL (ref 26–217)
IgA: 30 mg/dL — ABNORMAL LOW (ref 87–352)
IgG (Immunoglobin G), Serum: 1944 mg/dL — ABNORMAL HIGH (ref 700–1600)
Total Protein ELP: 7.6 g/dL (ref 6.0–8.5)

## 2015-04-30 ENCOUNTER — Ambulatory Visit (HOSPITAL_COMMUNITY)
Admission: RE | Admit: 2015-04-30 | Discharge: 2015-04-30 | Disposition: A | Discharge status: Home / Self Care | Payer: Medicare Other | Source: Ambulatory Visit | Attending: Hematology & Oncology | Admitting: Hematology & Oncology

## 2015-04-30 DIAGNOSIS — M4854XA Collapsed vertebra, not elsewhere classified, thoracic region, initial encounter for fracture: Secondary | ICD-10-CM | POA: Diagnosis not present

## 2015-04-30 DIAGNOSIS — D472 Monoclonal gammopathy: Secondary | ICD-10-CM | POA: Insufficient documentation

## 2015-05-07 ENCOUNTER — Ambulatory Visit (HOSPITAL_COMMUNITY): Payer: Medicare Other | Admitting: Hematology & Oncology

## 2015-05-07 ENCOUNTER — Other Ambulatory Visit (HOSPITAL_COMMUNITY): Payer: Medicare Other

## 2015-05-25 ENCOUNTER — Encounter (HOSPITAL_COMMUNITY): Payer: Self-pay | Admitting: Hematology & Oncology

## 2015-07-06 ENCOUNTER — Emergency Department (HOSPITAL_COMMUNITY)
Admission: EM | Admit: 2015-07-06 | Discharge: 2015-07-06 | Disposition: A | Discharge status: Home / Self Care | Payer: Medicare Other | Attending: Emergency Medicine | Admitting: Emergency Medicine

## 2015-07-06 ENCOUNTER — Encounter (HOSPITAL_COMMUNITY): Payer: Self-pay | Admitting: *Deleted

## 2015-07-06 ENCOUNTER — Emergency Department (HOSPITAL_COMMUNITY): Payer: Medicare Other

## 2015-07-06 DIAGNOSIS — F17210 Nicotine dependence, cigarettes, uncomplicated: Secondary | ICD-10-CM | POA: Diagnosis not present

## 2015-07-06 DIAGNOSIS — Y939 Activity, unspecified: Secondary | ICD-10-CM | POA: Diagnosis not present

## 2015-07-06 DIAGNOSIS — Y929 Unspecified place or not applicable: Secondary | ICD-10-CM | POA: Diagnosis not present

## 2015-07-06 DIAGNOSIS — S0083XA Contusion of other part of head, initial encounter: Secondary | ICD-10-CM | POA: Diagnosis not present

## 2015-07-06 DIAGNOSIS — Z79899 Other long term (current) drug therapy: Secondary | ICD-10-CM | POA: Diagnosis not present

## 2015-07-06 DIAGNOSIS — S0990XA Unspecified injury of head, initial encounter: Secondary | ICD-10-CM

## 2015-07-06 DIAGNOSIS — J449 Chronic obstructive pulmonary disease, unspecified: Secondary | ICD-10-CM | POA: Insufficient documentation

## 2015-07-06 DIAGNOSIS — Y999 Unspecified external cause status: Secondary | ICD-10-CM | POA: Diagnosis not present

## 2015-07-06 NOTE — ED Notes (Signed)
Pt and family updated on plan of care,  
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
## 2015-07-06 NOTE — Discharge Instructions (Signed)
Take over the counter tylenol and ibuprofen, as directed on packaging, as needed for discomfort.  Apply ice to the area(s) of discomfort, for 15 minutes at a time, several times per day for the next few days.  Do not fall asleep on an ice pack.  Call your regular medical doctor on Monday to schedule a follow up appointment this week.  Return to the Emergency Department immediately if worsening.

## 2015-07-29 NOTE — Assessment & Plan Note (Addendum)
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  Labs today: CBC diff, CMET, SPEP+ IFE, light chain assay, LDH, B2M, ESR, and CRP.  I personally reviewed and went over laboratory results with the patient.  The results are noted within this dictation.  Labs from April 2017 demonstrates an M-spike that is stable at 1.6, stable IgG at 1980, and stable elevated kappa light chain and kappa:lambda ratio.  Skeletal survey is reviewed in detail with the patient.  I personally reviewed and went over radiographic studies with the patient.  The results are noted within this dictation.    Her last bone density was about 1 year ago.  She re-started Fosamax about 6 months ago (Jan/Feb 2017), after significant patient education regarding the importance of this medication with regards to her progressive osteoporosis.  She previously refused IV intervention (ie Reclast).  Since she has not been on bisphosphonate therapy for 12 months or greater, I think it would be reasonable to repeat bone density exam in 6 months to re-evaluate her osteoporosis and evaluate her response to Fosamax therapy.  Order is placed for bone density exam in 6 months.  No CRAB findings on April 2017 lab work: C= Calcium increase of greater than 11.5 mg/dL R = Renal insufficiency with creatinine > 2 mg/dL A= Anemia with Hgb < 10 g/dL B= Bone disease with lytic lesions and/or osteopenia  She notes some difficulty with sleeping after discontinuing Xanax by Dr. Gerarda Fraction.  She has been tried on a trial of Trazodone, but she does not like the way this medication makes her feel.  As a result, she has stopped the medication.  She is advised to follow-up with her primary care provider regarding this issue.  Additionally, she has some additional stress in her life, secondary to issues with her "son and two deaths in the family within 1 week."    Labs in 3 months: CBC diff,  CMET, SPEP+ IFE, light chain assay, LDH, B2M, ESR, and CRP.  Smoking cessation education provided and free [place] smoking cessation class(es) pamphlet is given.  Return in 3 months for follow-up.
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
## 2015-07-30 ENCOUNTER — Encounter (HOSPITAL_COMMUNITY): Payer: Medicare Other | Attending: Oncology | Admitting: Oncology

## 2015-07-30 ENCOUNTER — Encounter (HOSPITAL_COMMUNITY): Payer: Self-pay | Admitting: Oncology

## 2015-07-30 ENCOUNTER — Other Ambulatory Visit (HOSPITAL_COMMUNITY): Payer: Self-pay | Admitting: Oncology

## 2015-07-30 ENCOUNTER — Encounter (HOSPITAL_COMMUNITY): Payer: Medicare Other

## 2015-07-30 VITALS — BP 100/66 | HR 73 | Temp 97.9°F | Resp 18 | Wt 88.0 lb

## 2015-07-30 DIAGNOSIS — D472 Monoclonal gammopathy: Secondary | ICD-10-CM | POA: Insufficient documentation

## 2015-07-30 DIAGNOSIS — Z780 Asymptomatic menopausal state: Secondary | ICD-10-CM | POA: Diagnosis not present

## 2015-07-30 DIAGNOSIS — M810 Age-related osteoporosis without current pathological fracture: Secondary | ICD-10-CM

## 2015-07-30 DIAGNOSIS — Z720 Tobacco use: Secondary | ICD-10-CM | POA: Diagnosis not present

## 2015-07-30 LAB — COMPREHENSIVE METABOLIC PANEL
ALT: 16 U/L (ref 14–54)
ANION GAP: 6 (ref 5–15)
AST: 20 U/L (ref 15–41)
Albumin: 4.3 g/dL (ref 3.5–5.0)
Alkaline Phosphatase: 55 U/L (ref 38–126)
BILIRUBIN TOTAL: 0.9 mg/dL (ref 0.3–1.2)
BUN: 14 mg/dL (ref 6–20)
CHLORIDE: 107 mmol/L (ref 101–111)
CO2: 25 mmol/L (ref 22–32)
Calcium: 8.9 mg/dL (ref 8.9–10.3)
Creatinine, Ser: 0.69 mg/dL (ref 0.44–1.00)
Glucose, Bld: 88 mg/dL (ref 65–99)
POTASSIUM: 3.7 mmol/L (ref 3.5–5.1)
Sodium: 138 mmol/L (ref 135–145)
TOTAL PROTEIN: 8.1 g/dL (ref 6.5–8.1)

## 2015-07-30 LAB — CBC WITH DIFFERENTIAL/PLATELET
BASOS ABS: 0.1 10*3/uL (ref 0.0–0.1)
Basophils Relative: 1 %
EOS PCT: 7 %
Eosinophils Absolute: 0.4 10*3/uL (ref 0.0–0.7)
HEMATOCRIT: 39.7 % (ref 36.0–46.0)
Hemoglobin: 13.7 g/dL (ref 12.0–15.0)
LYMPHS ABS: 2.2 10*3/uL (ref 0.7–4.0)
LYMPHS PCT: 40 %
MCH: 31.2 pg (ref 26.0–34.0)
MCHC: 34.5 g/dL (ref 30.0–36.0)
MCV: 90.4 fL (ref 78.0–100.0)
MONO ABS: 0.4 10*3/uL (ref 0.1–1.0)
MONOS PCT: 7 %
NEUTROS ABS: 2.4 10*3/uL (ref 1.7–7.7)
Neutrophils Relative %: 45 %
PLATELETS: 333 10*3/uL (ref 150–400)
RBC: 4.39 MIL/uL (ref 3.87–5.11)
RDW: 13.6 % (ref 11.5–15.5)
WBC: 5.4 10*3/uL (ref 4.0–10.5)

## 2015-07-30 LAB — C-REACTIVE PROTEIN: CRP: 0.5 mg/dL (ref <1.0)

## 2015-07-30 LAB — SEDIMENTATION RATE: SED RATE: 6 mm/h (ref 0–22)

## 2015-07-30 LAB — LACTATE DEHYDROGENASE: LDH: 98 U/L (ref 98–192)

## 2015-07-30 NOTE — Patient Instructions (Signed)
[place] at [facility] Discharge Instructions  RECOMMENDATIONS MADE BY THE CONSULTANT AND ANY TEST RESULTS WILL BE SENT TO YOUR REFERRING PHYSICIAN.  You were seen by Kirby Crigler, PA today. Return to clinic in 3 months for lab work and follow-up appointment. Call clinic with any questions or concerns.   Thank you for choosing [place] at [facility] to provide your oncology and hematology care.  To afford each patient quality time with our provider, please arrive at least 15 minutes before your scheduled appointment time.   Beginning January 23rd 2017 lab work for the [facility] will be done in the  Main lab at [facility] on 1st floor. If you have a lab appointment with the [place] please come in thru the  Main Entrance and check in at the main information desk  You need to re-schedule your appointment should you arrive 10 or more minutes late.  We strive to give you quality time with our providers, and arriving late affects you and other patients whose appointments are after yours.  Also, if you no show three or more times for appointments you may be dismissed from the clinic at the providers discretion.     Again, thank you for choosing [facility].  Our hope is that these requests will decrease the amount of time that you wait before being seen by our physicians.       _____________________________________________________________  Should you have questions after your visit to [facility], please contact our office at [phone] between the hours of 8:30 a.m. and 4:30 p.m.  Voicemails left after 4:30 p.m. will not be returned until the following business day.  For prescription refill requests, have your pharmacy contact our office.         Resources For Cancer Patients and their Caregivers ? American Cancer Society: Can assist with transportation, wigs, general needs, runs Look Good Feel Better.         (386)585-7135 ? Cancer Care: Provides financial assistance, online support groups, medication/co-pay assistance.  1-800-813-HOPE 4386685416) ? [place] Assists [place] cancer patients and their families through emotional , educational and financial support.  (773)873-9406 ? [place] DSS Where to apply for food stamps, Medicaid and utility assistance. (774)222-5320 ? [facility]: Transportation to medical appointments. (479)482-3424 ? Social Security Administration: May apply for disability if have a Stage IV cancer. 930-830-6526 (579)588-4012 ? [employer], Disability and Transit Services: Assists with nutrition, care and transit needs. [place] Support Programs: @10RELATIVEDAYS @ > Cancer Support Group  2nd Tuesday of the month 1pm-2pm, Journey Room  > Creative Journey  3rd Tuesday of the month 1130am-1pm, Journey Room  > Look Good Feel Better  1st Wednesday of the month 10am-12 noon, Journey Room (Call [place] to register [phone])

## 2015-07-31 LAB — KAPPA/LAMBDA LIGHT CHAINS
KAPPA, LAMDA LIGHT CHAIN RATIO: 39.35 — AB (ref 0.26–1.65)
Kappa free light chain: 283.3 mg/L — ABNORMAL HIGH (ref 3.3–19.4)
Lambda free light chains: 7.2 mg/L (ref 5.7–26.3)

## 2015-07-31 LAB — PROTEIN ELECTROPHORESIS, SERUM
A/G Ratio: 1.1 (ref 0.7–1.7)
ALBUMIN ELP: 3.8 g/dL (ref 2.9–4.4)
ALPHA-1-GLOBULIN: 0.2 g/dL (ref 0.0–0.4)
ALPHA-2-GLOBULIN: 0.7 g/dL (ref 0.4–1.0)
Beta Globulin: 0.9 g/dL (ref 0.7–1.3)
GLOBULIN, TOTAL: 3.6 g/dL (ref 2.2–3.9)
Gamma Globulin: 1.8 g/dL (ref 0.4–1.8)
M-SPIKE, %: 1.6 g/dL — AB
Total Protein ELP: 7.4 g/dL (ref 6.0–8.5)

## 2015-07-31 LAB — IGG, IGA, IGM
IGG (IMMUNOGLOBIN G), SERUM: 1944 mg/dL — AB (ref 700–1600)
IgA: 26 mg/dL — ABNORMAL LOW (ref 87–352)
IgM, Serum: 31 mg/dL (ref 26–217)

## 2015-07-31 LAB — BETA 2 MICROGLOBULIN, SERUM: Beta-2 Microglobulin: 2.5 mg/L — ABNORMAL HIGH (ref 0.6–2.4)

## 2015-08-04 LAB — IMMUNOFIXATION ELECTROPHORESIS
IGA: 26 mg/dL — AB (ref 87–352)
IGG (IMMUNOGLOBIN G), SERUM: 2025 mg/dL — AB (ref 700–1600)
IgM, Serum: 34 mg/dL (ref 26–217)
Total Protein ELP: 7.6 g/dL (ref 6.0–8.5)

## 2015-10-07 ENCOUNTER — Other Ambulatory Visit (HOSPITAL_COMMUNITY): Payer: Self-pay | Admitting: Family Medicine

## 2015-10-07 ENCOUNTER — Ambulatory Visit (HOSPITAL_COMMUNITY)
Admission: RE | Admit: 2015-10-07 | Discharge: 2015-10-07 | Disposition: A | Discharge status: Home / Self Care | Payer: Medicare Other | Source: Ambulatory Visit | Attending: Family Medicine | Admitting: Family Medicine

## 2015-10-07 DIAGNOSIS — M25572 Pain in left ankle and joints of left foot: Secondary | ICD-10-CM

## 2015-10-07 DIAGNOSIS — Z87828 Personal history of other (healed) physical injury and trauma: Secondary | ICD-10-CM | POA: Insufficient documentation

## 2015-10-13 ENCOUNTER — Other Ambulatory Visit (HOSPITAL_COMMUNITY): Payer: Self-pay

## 2015-10-13 DIAGNOSIS — M810 Age-related osteoporosis without current pathological fracture: Secondary | ICD-10-CM

## 2015-10-13 MED ORDER — ALENDRONATE SODIUM 70 MG PO TABS: 70.0000 mg | ORAL_TABLET | ORAL | 1 refills | Status: DC

## 2015-11-03 ENCOUNTER — Other Ambulatory Visit (HOSPITAL_COMMUNITY): Payer: Self-pay | Admitting: Hematology & Oncology

## 2015-11-03 ENCOUNTER — Encounter (HOSPITAL_COMMUNITY): Payer: Medicare Other

## 2015-11-03 ENCOUNTER — Encounter (HOSPITAL_COMMUNITY): Payer: Self-pay | Admitting: Hematology & Oncology

## 2015-11-03 ENCOUNTER — Encounter (HOSPITAL_COMMUNITY): Payer: Medicare Other | Attending: Hematology & Oncology | Admitting: Hematology & Oncology

## 2015-11-03 VITALS — BP 107/83 | HR 62 | Temp 97.8°F | Resp 16 | Wt 93.4 lb

## 2015-11-03 DIAGNOSIS — Z1231 Encounter for screening mammogram for malignant neoplasm of breast: Secondary | ICD-10-CM

## 2015-11-03 DIAGNOSIS — F17200 Nicotine dependence, unspecified, uncomplicated: Secondary | ICD-10-CM

## 2015-11-03 DIAGNOSIS — Z0000 Encounter for general adult medical examination without abnormal findings: Secondary | ICD-10-CM

## 2015-11-03 DIAGNOSIS — Z1239 Encounter for other screening for malignant neoplasm of breast: Secondary | ICD-10-CM

## 2015-11-03 DIAGNOSIS — D472 Monoclonal gammopathy: Secondary | ICD-10-CM | POA: Diagnosis not present

## 2015-11-03 DIAGNOSIS — F17210 Nicotine dependence, cigarettes, uncomplicated: Secondary | ICD-10-CM | POA: Insufficient documentation

## 2015-11-03 DIAGNOSIS — Z23 Encounter for immunization: Secondary | ICD-10-CM | POA: Diagnosis not present

## 2015-11-03 DIAGNOSIS — M810 Age-related osteoporosis without current pathological fracture: Secondary | ICD-10-CM

## 2015-11-03 DIAGNOSIS — M818 Other osteoporosis without current pathological fracture: Secondary | ICD-10-CM

## 2015-11-03 LAB — CBC WITH DIFFERENTIAL/PLATELET
Basophils Absolute: 0.1 K/uL (ref 0.0–0.1)
Basophils Relative: 1 %
Eosinophils Absolute: 0.6 K/uL (ref 0.0–0.7)
Eosinophils Relative: 9 %
HCT: 41.2 % (ref 36.0–46.0)
Hemoglobin: 14 g/dL (ref 12.0–15.0)
Lymphocytes Relative: 44 %
Lymphs Abs: 3 K/uL (ref 0.7–4.0)
MCH: 32.1 pg (ref 26.0–34.0)
MCHC: 34 g/dL (ref 30.0–36.0)
MCV: 94.5 fL (ref 78.0–100.0)
Monocytes Absolute: 0.5 K/uL (ref 0.1–1.0)
Monocytes Relative: 7 %
Neutro Abs: 2.7 K/uL (ref 1.7–7.7)
Neutrophils Relative %: 39 %
Platelets: 291 K/uL (ref 150–400)
RBC: 4.36 MIL/uL (ref 3.87–5.11)
RDW: 13 % (ref 11.5–15.5)
WBC: 6.9 K/uL (ref 4.0–10.5)

## 2015-11-03 LAB — COMPREHENSIVE METABOLIC PANEL WITH GFR
ALT: 16 U/L (ref 14–54)
AST: 21 U/L (ref 15–41)
Albumin: 4.4 g/dL (ref 3.5–5.0)
Alkaline Phosphatase: 59 U/L (ref 38–126)
Anion gap: 5 (ref 5–15)
BUN: 14 mg/dL (ref 6–20)
CO2: 27 mmol/L (ref 22–32)
Calcium: 9.6 mg/dL (ref 8.9–10.3)
Chloride: 104 mmol/L (ref 101–111)
Creatinine, Ser: 0.7 mg/dL (ref 0.44–1.00)
GFR calc Af Amer: >60 mL/min
GFR calc non Af Amer: >60 mL/min
Glucose, Bld: 77 mg/dL (ref 65–99)
Potassium: 3.6 mmol/L (ref 3.5–5.1)
Sodium: 136 mmol/L (ref 135–145)
Total Bilirubin: 0.8 mg/dL (ref 0.3–1.2)
Total Protein: 8.4 g/dL — ABNORMAL HIGH (ref 6.5–8.1)

## 2015-11-03 LAB — LACTATE DEHYDROGENASE: LDH: 89 U/L — AB (ref 98–192)

## 2015-11-03 LAB — C-REACTIVE PROTEIN: CRP: <0.8 mg/dL

## 2015-11-03 LAB — SEDIMENTATION RATE: SED RATE: 15 mm/h (ref 0–22)

## 2015-11-03 MED ORDER — INFLUENZA VAC SPLIT QUAD 0.5 ML IM SUSY
0.5000 mL | PREFILLED_SYRINGE | Freq: Once | INTRAMUSCULAR | Status: AC
  Administered 2015-11-03: 0.5 mL via INTRAMUSCULAR
  Filled 2015-11-03: qty 0.5

## 2015-11-03 NOTE — Assessment & Plan Note (Signed)
Still smoking. I addressed the importance of smoking cessation with the patient in detail.  We discussed the health benefits of cessation.  We discussed the health detriments of ongoing tobacco use including but not limited to COPD, heart disease and malignancy. We reviewed the multiple options for cessation and I offered to refer her to smoking cessation classes. We discussed other alternatives to quit such as chantix, wellbutrin. We will continue to address this moving forward.

## 2015-11-03 NOTE — Assessment & Plan Note (Addendum)
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  Labs today: CBC diff, CMET, SPEP+ IFE, light chain assay, LDH, B2M, ESR, and CRP.  I personally reviewed and went over laboratory results with the patient.  The results are noted within this dictation.  Labs from July 2017 demonstrates an M-spike that is stable at 1.6, and stability in other myeloma studies.    No CRAB findings on April 2017 lab work: C= Calcium increase of greater than 11.5 mg/dL R = Renal insufficiency with creatinine > 2 mg/dL A= Anemia with Hgb < 10 g/dL B= Bone disease with lytic lesions   Labs in 3 months: CBC diff, CMET, SPEP+ IFE, light chain assay, LDH, B2M, ESR, and CRP.  Return in 3 months for follow-up.
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
## 2015-11-03 NOTE — Progress Notes (Signed)
Misty Richardson presents today for injection per MD orders. Flu Vaccine administered IM in left Upper Arm. Administration without incident. Patient tolerated well.

## 2015-11-03 NOTE — Patient Instructions (Addendum)
[facility] at [facility] Discharge Instructions  RECOMMENDATIONS MADE BY THE CONSULTANT AND ANY TEST RESULTS WILL BE SENT TO YOUR REFERRING PHYSICIAN.  You saw Dr. Whitney Muse today. You had Flu shot today. Follow up in 3 months with labs. Dexa scan and mammogram will be scheduled.  Thank you for choosing [place] at [facility] to provide your oncology and hematology care.  To afford each patient quality time with our provider, please arrive at least 15 minutes before your scheduled appointment time.   Beginning January 23rd 2017 lab work for the [facility] will be done in the  Main lab at [facility] on 1st floor. If you have a lab appointment with the [facility] please come in thru the  Main Entrance and check in at the main information desk  You need to re-schedule your appointment should you arrive 10 or more minutes late.  We strive to give you quality time with our providers, and arriving late affects you and other patients whose appointments are after yours.  Also, if you no show three or more times for appointments you may be dismissed from the clinic at the providers discretion.     Again, thank you for choosing [facility].  Our hope is that these requests will decrease the amount of time that you wait before being seen by our physicians.       _____________________________________________________________  Should you have questions after your visit to [facility], please contact our office at [phone] between the hours of 8:30 a.m. and 4:30 p.m.  Voicemails left after 4:30 p.m. will not be returned until the following business day.  For prescription refill requests, have your pharmacy contact our office.         Resources For Cancer Patients and their Caregivers ? American Cancer Society: Can assist with transportation, wigs, general needs, runs Look Good Feel Better.         772-094-0886 ? Cancer Care: Provides financial assistance, online support groups, medication/co-pay assistance.  1-800-813-HOPE 959-888-6110) ? [facility] Assists [place] cancer patients and their families through emotional , educational and financial support.  845-886-7866 ? [place] DSS Where to apply for food stamps, Medicaid and utility assistance. (581)714-1132 ? RCATS: Transportation to medical appointments. (726)464-4935 ? Social Security Administration: May apply for disability if have a Stage IV cancer. 385-606-7800 308-145-8917 ? [employer], Disability and Transit Services: Assists with nutrition, care and transit needs. [place] Support Programs: @10RELATIVEDAYS @ > Cancer Support Group  2nd Tuesday of the month 1pm-2pm, Journey Room  > Creative Journey  3rd Tuesday of the month 1130am-1pm, Journey Room  > Look Good Feel Better  1st Wednesday of the month 10am-12 noon, Journey Room (Call American Cancer Society to register [phone])   Influenza Virus Vaccine injection (Fluarix) What is this medicine? INFLUENZA VIRUS VACCINE (in floo EN zuh VAHY ruhs vak SEEN) helps to reduce the risk of getting influenza also known as the flu. This medicine may be used for other purposes; ask your health care provider or pharmacist if you have questions. What should I tell my health care provider before I take this medicine? They need to know if you have any of these conditions: -bleeding disorder like hemophilia -fever or infection -Guillain-Barre syndrome or other neurological problems -immune system problems -infection with the human immunodeficiency virus (HIV) or AIDS -low blood platelet counts -multiple sclerosis -an  unusual or allergic reaction to influenza virus vaccine, eggs, chicken proteins, latex, gentamicin, other medicines, foods, dyes or preservatives -pregnant or trying to get  pregnant -breast-feeding How should I use this medicine? This vaccine is for injection into a muscle. It is given by a health care professional. A copy of Vaccine Information Statements will be given before each vaccination. Read this sheet carefully each time. The sheet may change frequently. Talk to your pediatrician regarding the use of this medicine in children. Special care may be needed. Overdosage: If you think you have taken too much of this medicine contact a poison control center or emergency room at once. NOTE: This medicine is only for you. Do not share this medicine with others. What if I miss a dose? This does not apply. What may interact with this medicine? -chemotherapy or radiation therapy -medicines that lower your immune system like etanercept, anakinra, infliximab, and adalimumab -medicines that treat or prevent blood clots like warfarin -phenytoin -steroid medicines like prednisone or cortisone -theophylline -vaccines This list may not describe all possible interactions. Give your health care provider a list of all the medicines, herbs, non-prescription drugs, or dietary supplements you use. Also tell them if you smoke, drink alcohol, or use illegal drugs. Some items may interact with your medicine. What should I watch for while using this medicine? Report any side effects that do not go away within 3 days to your doctor or health care professional. Call your health care provider if any unusual symptoms occur within 6 weeks of receiving this vaccine. You may still catch the flu, but the illness is not usually as bad. You cannot get the flu from the vaccine. The vaccine will not protect against colds or other illnesses that may cause fever. The vaccine is needed every year. What side effects may I notice from receiving this medicine? Side effects that you should report to your doctor or health care professional as soon as possible: -allergic reactions like skin rash,  itching or hives, swelling of the face, lips, or tongue Side effects that usually do not require medical attention (report to your doctor or health care professional if they continue or are bothersome): -fever -headache -muscle aches and pains -pain, tenderness, redness, or swelling at site where injected -weak or tired This list may not describe all possible side effects. Call your doctor for medical advice about side effects. You may report side effects to FDA at 1-800-FDA-1088. Where should I keep my medicine? This vaccine is only given in a clinic, pharmacy, doctor's office, or other health care setting and will not be stored at home. NOTE: This sheet is a summary. It may not cover all possible information. If you have questions about this medicine, talk to your doctor, pharmacist, or health care provider.    2016, Elsevier/Gold Standard. (2007-08-02 09:30:40)

## 2015-11-04 LAB — BETA 2 MICROGLOBULIN, SERUM: Beta-2 Microglobulin: 2.2 mg/L (ref 0.6–2.4)

## 2015-11-04 LAB — PROTEIN ELECTROPHORESIS, SERUM
A/G Ratio: 1.1 (ref 0.7–1.7)
ALPHA-1-GLOBULIN: 0.2 g/dL (ref 0.0–0.4)
Albumin ELP: 4.2 g/dL (ref 2.9–4.4)
Alpha-2-Globulin: 0.8 g/dL (ref 0.4–1.0)
Beta Globulin: 1 g/dL (ref 0.7–1.3)
GLOBULIN, TOTAL: 3.8 g/dL (ref 2.2–3.9)
Gamma Globulin: 1.8 g/dL (ref 0.4–1.8)
M-SPIKE, %: 1.6 g/dL — AB
TOTAL PROTEIN ELP: 8 g/dL (ref 6.0–8.5)

## 2015-11-04 LAB — IGG, IGA, IGM
IGA: 26 mg/dL — AB (ref 87–352)
IGG (IMMUNOGLOBIN G), SERUM: 1896 mg/dL — AB (ref 700–1600)
IgM, Serum: 30 mg/dL (ref 26–217)

## 2015-11-04 LAB — KAPPA/LAMBDA LIGHT CHAINS
Kappa free light chain: 247.4 mg/L — ABNORMAL HIGH (ref 3.3–19.4)
Kappa, lambda light chain ratio: 36.38 — ABNORMAL HIGH (ref 0.26–1.65)
LAMDA FREE LIGHT CHAINS: 6.8 mg/L (ref 5.7–26.3)

## 2015-11-06 LAB — IMMUNOFIXATION ELECTROPHORESIS
IGG (IMMUNOGLOBIN G), SERUM: 2257 mg/dL — AB (ref 700–1600)
IgA: 26 mg/dL — ABNORMAL LOW (ref 87–352)
IgM, Serum: 31 mg/dL (ref 26–217)
Total Protein ELP: 7.9 g/dL (ref 6.0–8.5)

## 2015-11-13 ENCOUNTER — Ambulatory Visit (HOSPITAL_COMMUNITY): Payer: Medicare Other

## 2015-11-13 ENCOUNTER — Ambulatory Visit (HOSPITAL_COMMUNITY)
Admission: RE | Admit: 2015-11-13 | Discharge: 2015-11-13 | Disposition: A | Discharge status: Home / Self Care | Payer: Medicare Other | Source: Ambulatory Visit | Attending: Hematology & Oncology | Admitting: Hematology & Oncology

## 2015-11-13 DIAGNOSIS — Z1231 Encounter for screening mammogram for malignant neoplasm of breast: Secondary | ICD-10-CM | POA: Insufficient documentation

## 2015-11-23 ENCOUNTER — Encounter (HOSPITAL_COMMUNITY): Payer: Self-pay | Admitting: Hematology & Oncology

## 2016-02-03 ENCOUNTER — Other Ambulatory Visit (HOSPITAL_COMMUNITY): Payer: Medicare Other

## 2016-02-05 ENCOUNTER — Other Ambulatory Visit (HOSPITAL_COMMUNITY): Payer: Medicare Other

## 2016-02-05 ENCOUNTER — Ambulatory Visit (HOSPITAL_COMMUNITY): Payer: Medicare Other | Admitting: Hematology & Oncology

## 2016-03-10 ENCOUNTER — Encounter (HOSPITAL_COMMUNITY): Payer: Self-pay

## 2016-03-10 ENCOUNTER — Encounter (HOSPITAL_COMMUNITY): Payer: Medicare HMO | Attending: Oncology | Admitting: Oncology

## 2016-03-10 ENCOUNTER — Encounter (HOSPITAL_COMMUNITY): Payer: Medicare HMO

## 2016-03-10 DIAGNOSIS — M810 Age-related osteoporosis without current pathological fracture: Secondary | ICD-10-CM | POA: Insufficient documentation

## 2016-03-10 DIAGNOSIS — D472 Monoclonal gammopathy: Secondary | ICD-10-CM | POA: Diagnosis not present

## 2016-03-10 DIAGNOSIS — Z720 Tobacco use: Secondary | ICD-10-CM

## 2016-03-10 DIAGNOSIS — S0003XA Contusion of scalp, initial encounter: Secondary | ICD-10-CM | POA: Diagnosis not present

## 2016-03-10 DIAGNOSIS — J449 Chronic obstructive pulmonary disease, unspecified: Secondary | ICD-10-CM | POA: Diagnosis not present

## 2016-03-10 DIAGNOSIS — X58XXXA Exposure to other specified factors, initial encounter: Secondary | ICD-10-CM | POA: Insufficient documentation

## 2016-03-10 LAB — CBC WITH DIFFERENTIAL/PLATELET
BASOS ABS: 0.1 10*3/uL (ref 0.0–0.1)
Basophils Relative: 1 %
Eosinophils Absolute: 0.4 10*3/uL (ref 0.0–0.7)
Eosinophils Relative: 6 %
HEMATOCRIT: 41.7 % (ref 36.0–46.0)
Hemoglobin: 14.6 g/dL (ref 12.0–15.0)
LYMPHS PCT: 30 %
Lymphs Abs: 2.3 10*3/uL (ref 0.7–4.0)
MCH: 31.6 pg (ref 26.0–34.0)
MCHC: 35 g/dL (ref 30.0–36.0)
MCV: 90.3 fL (ref 78.0–100.0)
MONO ABS: 0.3 10*3/uL (ref 0.1–1.0)
Monocytes Relative: 5 %
NEUTROS ABS: 4.5 10*3/uL (ref 1.7–7.7)
Neutrophils Relative %: 58 %
Platelets: 294 10*3/uL (ref 150–400)
RBC: 4.62 MIL/uL (ref 3.87–5.11)
RDW: 12.9 % (ref 11.5–15.5)
WBC: 7.6 10*3/uL (ref 4.0–10.5)

## 2016-03-10 LAB — COMPREHENSIVE METABOLIC PANEL
ALK PHOS: 57 U/L (ref 38–126)
ALT: 15 U/L (ref 14–54)
AST: 22 U/L (ref 15–41)
Albumin: 4.1 g/dL (ref 3.5–5.0)
Anion gap: 6 (ref 5–15)
BILIRUBIN TOTAL: 0.5 mg/dL (ref 0.3–1.2)
BUN: 15 mg/dL (ref 6–20)
CO2: 22 mmol/L (ref 22–32)
CREATININE: 0.76 mg/dL (ref 0.44–1.00)
Calcium: 9.1 mg/dL (ref 8.9–10.3)
Chloride: 107 mmol/L (ref 101–111)
GFR calc Af Amer: >60 mL/min (ref >60)
Glucose, Bld: 124 mg/dL — ABNORMAL HIGH (ref 65–99)
Potassium: 3.4 mmol/L — ABNORMAL LOW (ref 3.5–5.1)
Sodium: 135 mmol/L (ref 135–145)
TOTAL PROTEIN: 8.2 g/dL — AB (ref 6.5–8.1)

## 2016-03-10 LAB — SEDIMENTATION RATE: Sed Rate: 13 mm/hr (ref 0–22)

## 2016-03-10 LAB — C-REACTIVE PROTEIN

## 2016-03-10 LAB — LACTATE DEHYDROGENASE: LDH: 90 U/L — AB (ref 98–192)

## 2016-03-10 MED ORDER — ALENDRONATE SODIUM 70 MG PO TABS: 70.0000 mg | ORAL_TABLET | ORAL | 1 refills | Status: DC

## 2016-03-10 NOTE — Patient Instructions (Signed)
[facility] at [facility] Discharge Instructions  RECOMMENDATIONS MADE BY THE CONSULTANT AND ANY TEST RESULTS WILL BE SENT TO YOUR REFERRING PHYSICIAN.  You were seen today by Dr. Louise Zhou Follow up in 3 months with labs See Amy up front for appointments   Thank you for choosing [facility] at [facility] to provide your oncology and hematology care.  To afford each patient quality time with our provider, please arrive at least 15 minutes before your scheduled appointment time.    If you have a lab appointment with the Cancer Center please come in thru the  Main Entrance and check in at the main information desk  You need to re-schedule your appointment should you arrive 10 or more minutes late.  We strive to give you quality time with our providers, and arriving late affects you and other patients whose appointments are after yours.  Also, if you no show three or more times for appointments you may be dismissed from the clinic at the providers discretion.     Again, thank you for choosing [facility].  Our hope is that these requests will decrease the amount of time that you wait before being seen by our physicians.       _____________________________________________________________  Should you have questions after your visit to [facility], please contact our office at [phone] between the hours of 8:30 a.m. and 4:30 p.m.  Voicemails left after 4:30 p.m. will not be returned until the following business day.  For prescription refill requests, have your pharmacy contact our office.       Resources For Cancer Patients and their Caregivers ? American Cancer Society: Can assist with transportation, wigs, general needs, runs Look Good Feel Better.        1-888-227-6333 ? Cancer Care: Provides financial assistance, online support groups, medication/co-pay assistance.  1-800-813-HOPE (4673) ? [facility] Assists [place] cancer patients and their families through emotional , educational and financial support.  336-427-4357 ? [place] DSS Where to apply for food stamps, Medicaid and utility assistance. 336-342-1394 ? RCATS: Transportation to medical appointments. 336-347-2287 ? Social Security Administration: May apply for disability if have a Stage IV cancer. 336-342-7796 1-800-772-1213 ? [place] [facility], Disability and Transit Services: Assists with nutrition, care and transit needs. 336-349-2343  Cancer Center Support Programs: @10RELATIVEDAYS@ > Cancer Support Group  2nd Tuesday of the month 1pm-2pm, Journey Room  > Creative Journey  3rd Tuesday of the month 1130am-1pm, Journey Room  > Look Good Feel Better  1st Wednesday of the month 10am-12 noon, Journey Room (Call American Cancer Society to register 1-800-395-5775)    

## 2016-03-10 NOTE — Progress Notes (Signed)
Misty Richardson., MD [place] [id number]  No diagnosis found.   CURRENT THERAPY: Observation  INTERVAL HISTORY: Misty Richardson 61 y.o. female returns for followup of MGUS with 3% to 7% marrow plasma cells. Her myeloma markers were slowly increasing and her osteoporosis has shown worsening. She underwent a repeat BMBX in March 2016 with 9% plasma cells.   She has been doing well. She has some pain in her left ankle due to an old injury, but otherwise denies bone pain. She has some SOB with exertion, but she has been cutting back on smoking. She had some lower back pain, but that resolved itself. Denies chest pain, abdominal pain, or any other concerns.   Past Medical History:  Diagnosis Date  . COPD (chronic obstructive pulmonary disease) ([place])   . COPD (chronic obstructive pulmonary disease) ([place]) 09/05/2012  . MGUS (monoclonal gammopathy of unknown significance) 09/05/2012  . Osteoporosis 03/14/2014    has MGUS (monoclonal gammopathy of unknown significance); COPD (chronic obstructive pulmonary disease) ([place]); Tobacco use disorder; and Osteoporosis on her problem list.     is allergic to avelox [moxifloxacin hcl in nacl].  [name] does not currently have medications on file.  Past Surgical History:  Procedure Laterality Date  . ABDOMINAL HYSTERECTOMY    . APPENDECTOMY    . BONE BIOPSY Left 03/25/14  . BONE MARROW ASPIRATION Left 03/25/14  . BONE MARROW BIOPSY Left March 2016  . CESAREAN SECTION      Review of Systems  Constitutional: Negative.   HENT: Negative.   Eyes: Negative.   Respiratory: Positive for shortness of breath.   Cardiovascular: Negative.  Negative for chest pain.  Gastrointestinal: Negative.  Negative for abdominal pain.  Genitourinary: Negative.   Musculoskeletal: Positive for back pain and joint pain (L ankle).  Skin: Negative.   Neurological: Negative.   Endo/Heme/Allergies: Negative.   Psychiatric/Behavioral:  Negative.   All other systems reviewed and are negative. 14 point review of systems was performed and is negative except as detailed under history of present illness and above  PHYSICAL EXAMINATION  ECOG PERFORMANCE STATUS: 0 - Asymptomatic  Vitals:   03/10/16 1350  BP: 97/61  Pulse: 91  Resp: 18  Temp: 98.2 F (36.8 C)   Physical Exam  Constitutional: She is oriented to person, place, and time and well-developed, well-nourished, and in no distress.  HENT:  Head: Normocephalic and atraumatic.  Mouth/Throat: Oropharynx is clear and moist.  Eyes: Conjunctivae and EOM are normal. Pupils are equal, round, and reactive to light. Right eye exhibits no discharge. Left eye exhibits no discharge. No scleral icterus.  Neck: Normal range of motion. Neck supple. No thyromegaly present.  Cardiovascular: Normal rate, regular rhythm and normal heart sounds.   Pulmonary/Chest: Effort normal and breath sounds normal. No respiratory distress. She has no wheezes. She has no rales. She exhibits no tenderness.  Abdominal: Soft. Bowel sounds are normal. She exhibits no distension and no mass. There is no tenderness. There is no rebound and no guarding.  Musculoskeletal: Normal range of motion. She exhibits no edema.  Lymphadenopathy:    She has no cervical adenopathy.  Neurological: She is alert and oriented to person, place, and time. No cranial nerve deficit. Gait normal. Coordination normal.  Skin: Skin is warm and dry. No erythema.  Psychiatric: Mood, memory, affect and judgment normal.  Nursing note and vitals reviewed.  LABORATORY DATA: I have reviewed the data as listed  CBC  Component Value Date/Time   WBC 7.6 03/10/2016 1315   RBC 4.62 03/10/2016 1315   HGB 14.6 03/10/2016 1315   HCT 41.7 03/10/2016 1315   PLT 294 03/10/2016 1315   MCV 90.3 03/10/2016 1315   MCH 31.6 03/10/2016 1315   MCHC 35.0 03/10/2016 1315   RDW 12.9 03/10/2016 1315   LYMPHSABS 2.3 03/10/2016 1315   MONOABS  0.3 03/10/2016 1315   EOSABS 0.4 03/10/2016 1315   BASOSABS 0.1 03/10/2016 1315      Chemistry      Component Value Date/Time   NA 136 11/03/2015 1000   K 3.6 11/03/2015 1000   CL 104 11/03/2015 1000   CO2 27 11/03/2015 1000   BUN 14 11/03/2015 1000   CREATININE 0.70 11/03/2015 1000      Component Value Date/Time   CALCIUM 9.6 11/03/2015 1000   ALKPHOS 59 11/03/2015 1000   AST 21 11/03/2015 1000   ALT 16 11/03/2015 1000   BILITOT 0.8 11/03/2015 1000     PATHOLOGY:   RADIOGRAPHY: I have personally reviewed the radiological images as listed and agreed with the findings in the report. Screening Mammogram Bilateral 11/13/2015  IMPRESSION: No mammographic evidence of malignancy. A result letter of this screening mammogram will be mailed directly to the patient.  CT Head w/o Contrast 07/06/2015 IMPRESSION: No acute intracranial abnormalities. Right periorbital scalp hematoma. Opacification of some of the ethmoid air cells and of the left mastoid air cells. INAL DIAGNOSIS ASSESSMENT and THERAPY PLAN:  IgG kappa MGUS MGUS with 9% plasma cells on BMBX Osteoporosis Normal CBC, normal kidney function Abnormal kappa/lambda light chain ratio/M-spike 1.2 gm/dl Bone Survey 03/25/2014 with stable osteopenia/stable chronic L tenth rib FX Tobacco Abuse   Reviewed the labs with the patient. SPEP pending. No CRAB symptoms.    Refill Fosamax. Continue calcium-vitamin D.  She will return for follow up in 3 months with labs; SPEP, CBC, CMP, Beta 2 microglobulin, light chains.   No orders of the defined types were placed in this encounter.  All questions were answered. The patient knows to call the clinic with any problems, questions or concerns. We can certainly see the patient much sooner if necessary.  This document serves as a record of services personally performed by Twana First, MD. It was created on her behalf by Martinique Casey, a trained medical scribe. The creation of this  record is based on the scribe's personal observations and the provider's statements to them. This document has been checked and approved by the attending provider.  I have reviewed the above documentation for accuracy and completeness, and I agree with the above.  Martinique M Casey  03/10/2016  This note was electronically signed

## 2016-03-11 LAB — PROTEIN ELECTROPHORESIS, SERUM
A/G RATIO SPE: 1 (ref 0.7–1.7)
ALPHA-1-GLOBULIN: 0.3 g/dL (ref 0.0–0.4)
ALPHA-2-GLOBULIN: 0.8 g/dL (ref 0.4–1.0)
Albumin ELP: 3.9 g/dL (ref 2.9–4.4)
Beta Globulin: 0.9 g/dL (ref 0.7–1.3)
GLOBULIN, TOTAL: 3.9 (ref 2.2–3.9)
Gamma Globulin: 1.9 g/dL — ABNORMAL HIGH (ref 0.4–1.8)
M-SPIKE, %: 1.8 g/dL — AB
Total Protein ELP: 7.8 g/dL (ref 6.0–8.5)

## 2016-03-11 LAB — IGG, IGA, IGM
IGG (IMMUNOGLOBIN G), SERUM: 2120 mg/dL — AB (ref 700–1600)
IgA: 31 mg/dL — ABNORMAL LOW (ref 87–352)
IgM, Serum: 30 mg/dL (ref 26–217)

## 2016-03-11 LAB — BETA 2 MICROGLOBULIN, SERUM: Beta-2 Microglobulin: 1.9 mg/L (ref 0.6–2.4)

## 2016-03-11 LAB — KAPPA/LAMBDA LIGHT CHAINS
KAPPA FREE LGHT CHN: 220.2 mg/L — AB (ref 3.3–19.4)
Kappa, lambda light chain ratio: 37.32 — ABNORMAL HIGH (ref 0.26–1.65)
LAMDA FREE LIGHT CHAINS: 5.9 mg/L (ref 5.7–26.3)

## 2016-03-16 LAB — IMMUNOFIXATION ELECTROPHORESIS
IGM, SERUM: 29 mg/dL (ref 26–217)
IgA: 25 mg/dL — ABNORMAL LOW (ref 87–352)
IgG (Immunoglobin G), Serum: 2264 mg/dL — ABNORMAL HIGH (ref 700–1600)
Total Protein ELP: 7.8 g/dL (ref 6.0–8.5)

## 2016-05-06 DIAGNOSIS — J069 Acute upper respiratory infection, unspecified: Secondary | ICD-10-CM | POA: Diagnosis not present

## 2016-05-06 DIAGNOSIS — J441 Chronic obstructive pulmonary disease with (acute) exacerbation: Secondary | ICD-10-CM | POA: Diagnosis not present

## 2016-06-09 ENCOUNTER — Ambulatory Visit (HOSPITAL_COMMUNITY)
Admission: RE | Admit: 2016-06-09 | Discharge: 2016-06-09 | Disposition: A | Discharge status: Home / Self Care | Payer: Medicare HMO | Source: Ambulatory Visit | Attending: Oncology | Admitting: Oncology

## 2016-06-09 ENCOUNTER — Encounter (HOSPITAL_COMMUNITY): Payer: Medicare HMO

## 2016-06-09 ENCOUNTER — Encounter (HOSPITAL_COMMUNITY): Payer: Medicare HMO | Attending: Oncology | Admitting: Oncology

## 2016-06-09 ENCOUNTER — Encounter (HOSPITAL_COMMUNITY): Payer: Self-pay

## 2016-06-09 VITALS — BP 101/65 | HR 71 | Temp 98.0°F | Resp 18 | Wt 92.7 lb

## 2016-06-09 DIAGNOSIS — S2232XA Fracture of one rib, left side, initial encounter for closed fracture: Secondary | ICD-10-CM | POA: Insufficient documentation

## 2016-06-09 DIAGNOSIS — X58XXXA Exposure to other specified factors, initial encounter: Secondary | ICD-10-CM | POA: Insufficient documentation

## 2016-06-09 DIAGNOSIS — S22060A Wedge compression fracture of T7-T8 vertebra, initial encounter for closed fracture: Secondary | ICD-10-CM | POA: Diagnosis not present

## 2016-06-09 DIAGNOSIS — D472 Monoclonal gammopathy: Secondary | ICD-10-CM | POA: Insufficient documentation

## 2016-06-09 DIAGNOSIS — S0003XA Contusion of scalp, initial encounter: Secondary | ICD-10-CM | POA: Insufficient documentation

## 2016-06-09 DIAGNOSIS — M818 Other osteoporosis without current pathological fracture: Secondary | ICD-10-CM

## 2016-06-09 DIAGNOSIS — Z720 Tobacco use: Secondary | ICD-10-CM

## 2016-06-09 DIAGNOSIS — J449 Chronic obstructive pulmonary disease, unspecified: Secondary | ICD-10-CM

## 2016-06-09 DIAGNOSIS — M810 Age-related osteoporosis without current pathological fracture: Secondary | ICD-10-CM

## 2016-06-09 LAB — CBC WITH DIFFERENTIAL/PLATELET
BASOS ABS: 0.1 10*3/uL (ref 0.0–0.1)
Basophils Relative: 1 %
EOS PCT: 6 %
Eosinophils Absolute: 0.5 10*3/uL (ref 0.0–0.7)
HEMATOCRIT: 41.5 % (ref 36.0–46.0)
Hemoglobin: 14.2 g/dL (ref 12.0–15.0)
LYMPHS ABS: 2.6 10*3/uL (ref 0.7–4.0)
LYMPHS PCT: 32 %
MCH: 31.3 pg (ref 26.0–34.0)
MCHC: 34.2 g/dL (ref 30.0–36.0)
MCV: 91.4 fL (ref 78.0–100.0)
Monocytes Absolute: 0.6 10*3/uL (ref 0.1–1.0)
Monocytes Relative: 7 %
NEUTROS PCT: 54 %
Neutro Abs: 4.4 10*3/uL (ref 1.7–7.7)
PLATELETS: 320 10*3/uL (ref 150–400)
RBC: 4.54 MIL/uL (ref 3.87–5.11)
RDW: 14.1 % (ref 11.5–15.5)
WBC: 8.2 10*3/uL (ref 4.0–10.5)

## 2016-06-09 LAB — COMPREHENSIVE METABOLIC PANEL
ALBUMIN: 4.1 g/dL (ref 3.5–5.0)
ALT: 14 U/L (ref 14–54)
ANION GAP: 6 (ref 5–15)
AST: 19 U/L (ref 15–41)
Alkaline Phosphatase: 62 U/L (ref 38–126)
BILIRUBIN TOTAL: 0.4 mg/dL (ref 0.3–1.2)
BUN: 13 mg/dL (ref 6–20)
CHLORIDE: 104 mmol/L (ref 101–111)
CO2: 24 mmol/L (ref 22–32)
Calcium: 9.4 mg/dL (ref 8.9–10.3)
Creatinine, Ser: 0.74 mg/dL (ref 0.44–1.00)
GFR calc Af Amer: >60 mL/min (ref >60)
GFR calc non Af Amer: >60 mL/min (ref >60)
Glucose, Bld: 90 mg/dL (ref 65–99)
POTASSIUM: 3.9 mmol/L (ref 3.5–5.1)
Sodium: 134 mmol/L — ABNORMAL LOW (ref 135–145)
Total Protein: 8.1 g/dL (ref 6.5–8.1)

## 2016-06-09 NOTE — Progress Notes (Signed)
[place], MD [place] [address]  MGUS (monoclonal gammopathy of unknown significance) - Plan: CBC with Differential, Comprehensive metabolic panel, Kappa/lambda light chains, IgG, IgA, IgM, Beta 2 microglobuline, serum, Protein electrophoresis, serum, DG Bone Survey Met   CURRENT THERAPY: Observation  INTERVAL HISTORY: Misty Richardson 61 y.o. female returns for followup of MGUS with 3% to 7% marrow plasma cells. Her myeloma markers were slowly increasing and her osteoporosis has shown worsening. She underwent a repeat BMBX in March 2016 with 9% plasma cells.   She has been doing well. She has some pain in her left ankle due to an old injury, but otherwise denies bone pain. She has some SOB with exertion, but she has been cutting back on smoking. She had some lower back pain, but that resolved itself. Denies chest pain, abdominal pain, or any other concerns.   Past Medical History:  Diagnosis Date  . COPD (chronic obstructive pulmonary disease) ([place])   . COPD (chronic obstructive pulmonary disease) ([place]) 09/05/2012  . MGUS (monoclonal gammopathy of unknown significance) 09/05/2012  . Osteoporosis 03/14/2014    has MGUS (monoclonal gammopathy of unknown significance); COPD (chronic obstructive pulmonary disease) ([place]); Tobacco use disorder; and Osteoporosis on her problem list.     is allergic to avelox [moxifloxacin hcl in nacl].  Ms. Dalporto had no medications administered during this visit.  Past Surgical History:  Procedure Laterality Date  . ABDOMINAL HYSTERECTOMY    . APPENDECTOMY    . BONE BIOPSY Left 03/25/14  . BONE MARROW ASPIRATION Left 03/25/14  . BONE MARROW BIOPSY Left March 2016  . CESAREAN SECTION      Review of Systems  Constitutional: Negative.   HENT: Negative.   Eyes: Negative.   Respiratory: Negative for shortness of breath.   Cardiovascular: Negative.  Negative for chest pain.  Gastrointestinal: Negative.  Negative  for abdominal pain.  Genitourinary: Negative.   Musculoskeletal: Negative for back pain and joint pain.  Skin: Negative.   Neurological: Negative.   Endo/Heme/Allergies: Negative.   Psychiatric/Behavioral: Negative.   All other systems reviewed and are negative. 14 point review of systems was performed and is negative except as detailed under history of present illness and above  PHYSICAL EXAMINATION  ECOG PERFORMANCE STATUS: 0 - Asymptomatic  Vitals:   06/09/16 1442  BP: 101/65  Pulse: 71  Resp: 18  Temp: 98 F (36.7 C)   Physical Exam  Constitutional: She is oriented to person, place, and time and well-developed, well-nourished, and in no distress.  HENT:  Head: Normocephalic and atraumatic.  Mouth/Throat: Oropharynx is clear and moist.  Eyes: Conjunctivae and EOM are normal. Pupils are equal, round, and reactive to light. Right eye exhibits no discharge. Left eye exhibits no discharge. No scleral icterus.  Neck: Normal range of motion. Neck supple. No thyromegaly present.  Cardiovascular: Normal rate, regular rhythm and normal heart sounds.   Pulmonary/Chest: Effort normal. No respiratory distress. She has wheezes. She has no rales. She exhibits no tenderness.  Mild inspiratory wheezes in right lung base  Abdominal: Soft. Bowel sounds are normal. She exhibits no distension and no mass. There is no tenderness. There is no rebound and no guarding.  Musculoskeletal: Normal range of motion. She exhibits no edema.  Lymphadenopathy:    She has no cervical adenopathy.  Neurological: She is alert and oriented to person, place, and time. No cranial nerve deficit. Gait normal. Coordination normal.  Skin: Skin is warm and  dry. No erythema.  Psychiatric: Mood, memory, affect and judgment normal.  Nursing note and vitals reviewed.  LABORATORY DATA: I have reviewed the data as listed  CBC    Component Value Date/Time   WBC 8.2 06/09/2016 1355   RBC 4.54 06/09/2016 1355   HGB  14.2 06/09/2016 1355   HCT 41.5 06/09/2016 1355   PLT 320 06/09/2016 1355   MCV 91.4 06/09/2016 1355   MCH 31.3 06/09/2016 1355   MCHC 34.2 06/09/2016 1355   RDW 14.1 06/09/2016 1355   LYMPHSABS 2.6 06/09/2016 1355   MONOABS 0.6 06/09/2016 1355   EOSABS 0.5 06/09/2016 1355   BASOSABS 0.1 06/09/2016 1355      Chemistry      Component Value Date/Time   NA 134 (L) 06/09/2016 1355   K 3.9 06/09/2016 1355   CL 104 06/09/2016 1355   CO2 24 06/09/2016 1355   BUN 13 06/09/2016 1355   CREATININE 0.74 06/09/2016 1355      Component Value Date/Time   CALCIUM 9.4 06/09/2016 1355   ALKPHOS 62 06/09/2016 1355   AST 19 06/09/2016 1355   ALT 14 06/09/2016 1355   BILITOT 0.4 06/09/2016 1355     PATHOLOGY:   RADIOGRAPHY: I have personally reviewed the radiological images as listed and agreed with the findings in the report. Screening Mammogram Bilateral 11/13/2015  IMPRESSION: No mammographic evidence of malignancy. A result letter of this screening mammogram will be mailed directly to the patient.  CT Head w/o Contrast 07/06/2015 IMPRESSION: No acute intracranial abnormalities. Right periorbital scalp hematoma. Opacification of some of the ethmoid air cells and of the left mastoid air cells. INAL DIAGNOSIS ASSESSMENT and THERAPY PLAN:  IgG kappa MGUS MGUS with 9% plasma cells on BMBX Osteoporosis Normal CBC, normal kidney function, no hypercalcemia Abnormal kappa/lambda light chain ratio/M-spike 1.2 gm/dl Tobacco Abuse  Patient is asymptomatic. Very slow progression on her SPEP from 1.6 (october 2017) to 1.8 (February 2018). SPEP pending from today. Reviewed the labs with the patient. No CRAB symptoms.    I have ordered for a skeletal survey since its been a year since her last one.   Continue calcium-vitamin D and fosamax for her osetoporosis.  She will return for follow up in 6 months with labs; SPEP, CBC, CMP, Beta 2 microglobulin, light chains.   Orders Placed This  Encounter  Procedures  . DG Bone Survey Met    Standing Status:   Future    Number of Occurrences:   1    Standing Expiration Date:   08/09/2017    Order Specific Question:   Reason for Exam (SYMPTOM  OR DIAGNOSIS REQUIRED)    Answer:   MGUS, surveillance for lytic bone lesions    Order Specific Question:   Is patient pregnant?    Answer:   No    Order Specific Question:   Preferred imaging location?    Answer:   [facility]    Order Specific Question:   Radiology Contrast Protocol - do NOT remove file path    Answer:   [email or web address]  . CBC with Differential    Standing Status:   Future    Standing Expiration Date:   06/09/2017  . Comprehensive metabolic panel    Standing Status:   Future    Standing Expiration Date:   06/09/2017  . Kappa/lambda light chains    Standing Status:   Future    Standing Expiration Date:   06/09/2017  . IgG, IgA, IgM  Standing Status:   Future    Standing Expiration Date:   06/09/2017  . Beta 2 microglobuline, serum    Standing Status:   Future    Standing Expiration Date:   06/09/2017  . Protein electrophoresis, serum    Standing Status:   Future    Standing Expiration Date:   06/09/2017   All questions were answered. The patient knows to call the clinic with any problems, questions or concerns. We can certainly see the patient much sooner if necessary.  I have reviewed the above documentation for accuracy and completeness, and I agree with the above.  Twana First, MD  06/09/2016  This note was electronically signed

## 2016-06-09 NOTE — Patient Instructions (Signed)
[facility] at [facility] Discharge Instructions  RECOMMENDATIONS MADE BY THE CONSULTANT AND ANY TEST RESULTS WILL BE SENT TO YOUR REFERRING PHYSICIAN.  You were seen today by Dr. Louise Zhou Follow up in 6 months with lab work   Thank you for choosing [facility] at [facility] to provide your oncology and hematology care.  To afford each patient quality time with our provider, please arrive at least 15 minutes before your scheduled appointment time.    If you have a lab appointment with the Cancer Center please come in thru the  Main Entrance and check in at the main information desk  You need to re-schedule your appointment should you arrive 10 or more minutes late.  We strive to give you quality time with our providers, and arriving late affects you and other patients whose appointments are after yours.  Also, if you no show three or more times for appointments you may be dismissed from the clinic at the providers discretion.     Again, thank you for choosing [facility].  Our hope is that these requests will decrease the amount of time that you wait before being seen by our physicians.       _____________________________________________________________  Should you have questions after your visit to [facility], please contact our office at [phone] between the hours of 8:30 a.m. and 4:30 p.m.  Voicemails left after 4:30 p.m. will not be returned until the following business day.  For prescription refill requests, have your pharmacy contact our office.       Resources For Cancer Patients and their Caregivers ? American Cancer Society: Can assist with transportation, wigs, general needs, runs Look Good Feel Better.        1-888-227-6333 ? Cancer Care: Provides financial assistance, online support groups, medication/co-pay assistance.  1-800-813-HOPE (4673) ? [facility] Assists  [place] cancer patients and their families through emotional , educational and financial support.  336-427-4357 ? [place] DSS Where to apply for food stamps, Medicaid and utility assistance. 336-342-1394 ? [facility]: Transportation to medical appointments. 336-347-2287 ? Social Security Administration: May apply for disability if have a Stage IV cancer. 336-342-7796 1-800-772-1213 ? [place] Aging, Disability and Transit Services: Assists with nutrition, care and transit needs. 336-349-2343  Cancer Center Support Programs: @10RELATIVEDAYS@ > Cancer Support Group  2nd Tuesday of the month 1pm-2pm, Journey Room  > Creative Journey  3rd Tuesday of the month 1130am-1pm, Journey Room  > Look Good Feel Better  1st Wednesday of the month 10am-12 noon, Journey Room (Call American Cancer Society to register 1-800-395-5775)    

## 2016-06-10 LAB — PROTEIN ELECTROPHORESIS, SERUM
A/G Ratio: 1 (ref 0.7–1.7)
ALPHA-2-GLOBULIN: 0.8 g/dL (ref 0.4–1.0)
Albumin ELP: 3.9 g/dL (ref 2.9–4.4)
Alpha-1-Globulin: 0.3 g/dL (ref 0.0–0.4)
BETA GLOBULIN: 0.9 g/dL (ref 0.7–1.3)
GLOBULIN, TOTAL: 3.9 g/dL (ref 2.2–3.9)
Gamma Globulin: 2 g/dL — ABNORMAL HIGH (ref 0.4–1.8)
M-SPIKE, %: 1.7 g/dL — AB
Total Protein ELP: 7.8 g/dL (ref 6.0–8.5)

## 2016-06-10 LAB — KAPPA/LAMBDA LIGHT CHAINS
KAPPA, LAMDA LIGHT CHAIN RATIO: 38.34 — AB (ref 0.26–1.65)
Kappa free light chain: 260.7 mg/L — ABNORMAL HIGH (ref 3.3–19.4)
LAMDA FREE LIGHT CHAINS: 6.8 mg/L (ref 5.7–26.3)

## 2016-06-10 LAB — BETA 2 MICROGLOBULIN, SERUM: BETA 2 MICROGLOBULIN: 2.3 mg/L (ref 0.6–2.4)

## 2016-06-10 NOTE — Progress Notes (Signed)
Please call patient and let her know that there are no new bone lesions on her skeletal survey

## 2016-06-21 DIAGNOSIS — M5416 Radiculopathy, lumbar region: Secondary | ICD-10-CM | POA: Diagnosis not present

## 2016-07-11 ENCOUNTER — Emergency Department (HOSPITAL_COMMUNITY): Payer: Medicare HMO

## 2016-07-11 ENCOUNTER — Encounter (HOSPITAL_COMMUNITY): Payer: Self-pay | Admitting: Emergency Medicine

## 2016-07-11 ENCOUNTER — Emergency Department (HOSPITAL_COMMUNITY)
Admission: EM | Admit: 2016-07-11 | Discharge: 2016-07-11 | Disposition: A | Discharge status: Home Health | Payer: Medicare HMO | Attending: Emergency Medicine | Admitting: Emergency Medicine

## 2016-07-11 DIAGNOSIS — F17210 Nicotine dependence, cigarettes, uncomplicated: Secondary | ICD-10-CM | POA: Insufficient documentation

## 2016-07-11 DIAGNOSIS — Y999 Unspecified external cause status: Secondary | ICD-10-CM | POA: Insufficient documentation

## 2016-07-11 DIAGNOSIS — S9001XA Contusion of right ankle, initial encounter: Secondary | ICD-10-CM | POA: Insufficient documentation

## 2016-07-11 DIAGNOSIS — S82899A Other fracture of unspecified lower leg, initial encounter for closed fracture: Secondary | ICD-10-CM | POA: Diagnosis not present

## 2016-07-11 DIAGNOSIS — M25571 Pain in right ankle and joints of right foot: Secondary | ICD-10-CM

## 2016-07-11 DIAGNOSIS — Y939 Activity, unspecified: Secondary | ICD-10-CM | POA: Insufficient documentation

## 2016-07-11 DIAGNOSIS — W010XXA Fall on same level from slipping, tripping and stumbling without subsequent striking against object, initial encounter: Secondary | ICD-10-CM | POA: Insufficient documentation

## 2016-07-11 DIAGNOSIS — S99921A Unspecified injury of right foot, initial encounter: Secondary | ICD-10-CM | POA: Diagnosis not present

## 2016-07-11 DIAGNOSIS — J449 Chronic obstructive pulmonary disease, unspecified: Secondary | ICD-10-CM | POA: Diagnosis not present

## 2016-07-11 DIAGNOSIS — Y929 Unspecified place or not applicable: Secondary | ICD-10-CM | POA: Diagnosis not present

## 2016-07-11 DIAGNOSIS — S92255A Nondisplaced fracture of navicular [scaphoid] of left foot, initial encounter for closed fracture: Secondary | ICD-10-CM | POA: Diagnosis not present

## 2016-07-11 DIAGNOSIS — S92252A Displaced fracture of navicular [scaphoid] of left foot, initial encounter for closed fracture: Secondary | ICD-10-CM | POA: Diagnosis not present

## 2016-07-11 DIAGNOSIS — R69 Illness, unspecified: Secondary | ICD-10-CM | POA: Diagnosis not present

## 2016-07-11 DIAGNOSIS — S99922A Unspecified injury of left foot, initial encounter: Secondary | ICD-10-CM | POA: Diagnosis present

## 2016-07-11 MED ORDER — NAPROXEN 500 MG PO TABS: 500.0000 mg | ORAL_TABLET | Freq: Two times a day (BID) | ORAL | 0 refills | Status: DC

## 2016-07-11 MED ORDER — HYDROCODONE-ACETAMINOPHEN 5-325 MG PO TABS: ORAL_TABLET | ORAL | 0 refills | Status: DC

## 2016-07-11 MED ORDER — HYDROCODONE-ACETAMINOPHEN 5-325 MG PO TABS
1.0000 | ORAL_TABLET | Freq: Once | ORAL | Status: AC
  Administered 2016-07-11: 1 via ORAL
  Filled 2016-07-11: qty 1

## 2016-07-11 NOTE — ED Triage Notes (Signed)
Patient c/o right foot pain and left ankle pain. Per patient fell yesterday while going out front door yesterday due to missing step. Denies hitting head or LOC. Swelling noted to top of right foot and left ankle.

## 2016-07-11 NOTE — Discharge Instructions (Signed)
Elevate your feet when possible.  Call Dr. Ruthe Mannan office to arrange a follow-up appt

## 2016-07-12 ENCOUNTER — Ambulatory Visit: Payer: Medicare HMO | Admitting: Orthopaedic Surgery

## 2016-07-13 ENCOUNTER — Encounter: Payer: Self-pay | Admitting: Orthopaedic Surgery

## 2016-07-13 ENCOUNTER — Ambulatory Visit (INDEPENDENT_AMBULATORY_CARE_PROVIDER_SITE_OTHER): Payer: Medicare HMO | Admitting: Orthopaedic Surgery

## 2016-07-13 VITALS — BP 118/78 | HR 86 | Temp 97.1°F | Ht 62.0 in | Wt 95.0 lb

## 2016-07-13 DIAGNOSIS — S92255A Nondisplaced fracture of navicular [scaphoid] of left foot, initial encounter for closed fracture: Secondary | ICD-10-CM

## 2016-07-13 NOTE — Progress Notes (Signed)
Subjective:    Patient ID: Misty Richardson, female    DOB: [birth year], 61 y.o.   MRN: [id number]  HPI She took a misstep while leaving her friend's house on 07-11-16 and hurt her left foot.  She was seen in the ER and x-rays were done.  She was given CAM walker.  X-rays showed: IMPRESSION: Small acute chip fracture along the dorsal aspect of the navicular bone.  Right foot was negative.  I have explained the findings to her.  She is doing well.  She has little pain.   Review of Systems  HENT: Negative for congestion.   Respiratory: Positive for shortness of breath. Negative for cough.   Cardiovascular: Negative for chest pain and leg swelling.  Endocrine: Positive for cold intolerance.  Musculoskeletal: Positive for arthralgias, gait problem and joint swelling.  Allergic/Immunologic: Positive for environmental allergies.   Past Medical History:  Diagnosis Date  . COPD (chronic obstructive pulmonary disease) ([place])   . COPD (chronic obstructive pulmonary disease) ([place]) 09/05/2012  . MGUS (monoclonal gammopathy of unknown significance) 09/05/2012  . Osteoporosis 03/14/2014    Past Surgical History:  Procedure Laterality Date  . ABDOMINAL HYSTERECTOMY    . APPENDECTOMY    . BONE BIOPSY Left 03/25/14  . BONE MARROW ASPIRATION Left 03/25/14  . BONE MARROW BIOPSY Left March 2016  . CESAREAN SECTION      Current Outpatient Prescriptions on File Prior to Visit  Medication Sig Dispense Refill  . albuterol (ACCUNEB) 1.25 MG/3ML nebulizer solution Take 1 ampule by nebulization every 6 (six) hours as needed. Reported on 07/30/2015    . albuterol (PROVENTIL HFA;VENTOLIN HFA) 108 (90 BASE) MCG/ACT inhaler Inhale 2 puffs into the lungs every 6 (six) hours as needed for wheezing or shortness of breath. Reported on 02/06/2015    . alendronate (FOSAMAX) 70 MG tablet Take 1 tablet (70 mg total) by mouth once a week. Reported on 02/06/2015 12 tablet 1  . budesonide-formoterol (SYMBICORT) 160-4.5  MCG/ACT inhaler Inhale 2 puffs into the lungs 2 (two) times daily. 1 Inhaler 1  . Calcium Carbonate-Vitamin D (CALCIUM 600+D) 600-200 MG-UNIT TABS Take 1 tablet by mouth 2 (two) times daily.    Marland Kitchen HYDROcodone-acetaminophen (NORCO/VICODIN) 5-325 MG tablet Take one tab po q 4-6 hrs prn pain 20 tablet 0  . naproxen (NAPROSYN) 500 MG tablet Take 1 tablet (500 mg total) by mouth 2 (two) times daily with a meal. 20 tablet 0   No current facility-administered medications on file prior to visit.     Social History   Social History  . Marital status: Divorced    Spouse name: N/A  . Number of children: N/A  . Years of education: N/A   Occupational History  . Not on file.   Social History Main Topics  . Smoking status: Current Every Day Smoker    Packs/day: 1.00    Types: Cigarettes  . Smokeless tobacco: Former Systems developer    Quit date: 07/31/2012  . Alcohol use Yes     Comment: occasionally  . Drug use: No  . Sexual activity: Yes    Birth control/ protection: None   Other Topics Concern  . Not on file   Social History Narrative  . No narrative on file    Family History  Problem Relation Age of Onset  . Heart failure Father     BP 118/78   Pulse 86   Temp 97.1 F (36.2 C)   Ht '5\' 2"'$  (1.575 m)  Wt 95 lb (43.1 kg)   BMI 17.38 kg/m      Objective:   Physical Exam  Constitutional: She is oriented to person, place, and time. She appears well-developed and well-nourished.  HENT:  Head: Normocephalic and atraumatic.  Eyes: Conjunctivae and EOM are normal. Pupils are equal, round, and reactive to light.  Neck: Normal range of motion. Neck supple.  Cardiovascular: Normal rate, regular rhythm and intact distal pulses.   Pulmonary/Chest: Effort normal.  Abdominal: Soft.  Musculoskeletal: She exhibits tenderness (left dorsal foot tender, slight swelling. limp to the left.  Right foot has some ecchymosis around the base of the toes.  NV itnact.).  Neurological: She is alert and  oriented to person, place, and time. She displays normal reflexes. No cranial nerve deficit. She exhibits normal muscle tone. Coordination normal.  Skin: Skin is warm and dry.  Psychiatric: She has a normal mood and affect. Her behavior is normal. Judgment and thought content normal.  Vitals reviewed.         Assessment & Plan:   Encounter Diagnosis  Name Primary?  . Closed nondisplaced fracture of navicular bone of left foot, initial encounter Yes   I have given instructions for Contrast Baths.  Continue CAM walker when out of the house.  Return in two weeks.  X-rays on return.  Call if any problem.  Precautions discussed.   Electronically Signed Sanjuana Kava, MD 6/26/20182:21 PM

## 2016-07-14 NOTE — ED Provider Notes (Signed)
[facility] DEPT Provider Note   CSN: [id number] Arrival date & time: 07/11/16  1409     History   Chief Complaint Chief Complaint  Patient presents with  . Ankle Pain    HPI Misty Richardson is a 61 y.o. female.  HPI  Misty Richardson is a 61 y.o. female who presents to the Emergency Department complaining of bilateral ankle pain secondary to a mechanical fall that occurred one day prior to arrival.  States she tripped on a step and twisting both ankles.  Describes throbbing pain that is worsening.  Unable to bear weight to the left ankle.  She denies head injury, LOC, neck or back pain.  No therapies prior to arrival.    Past Medical History:  Diagnosis Date  . COPD (chronic obstructive pulmonary disease) ([place])   . COPD (chronic obstructive pulmonary disease) ([place]) 09/05/2012  . MGUS (monoclonal gammopathy of unknown significance) 09/05/2012  . Osteoporosis 03/14/2014    Patient Active Problem List   Diagnosis Date Noted  . Osteoporosis 03/14/2014  . Tobacco use disorder 09/19/2012  . MGUS (monoclonal gammopathy of unknown significance) 09/05/2012  . COPD (chronic obstructive pulmonary disease) ([place]) 09/05/2012    Past Surgical History:  Procedure Laterality Date  . ABDOMINAL HYSTERECTOMY    . APPENDECTOMY    . BONE BIOPSY Left 03/25/14  . BONE MARROW ASPIRATION Left 03/25/14  . BONE MARROW BIOPSY Left March 2016  . CESAREAN SECTION      OB History    Gravida Para Term Preterm AB Living   '2 2       2   '$ SAB TAB Ectopic Multiple Live Births                   Home Medications    Prior to Admission medications   Medication Sig Start Date End Date Taking? Authorizing Provider  albuterol (ACCUNEB) 1.25 MG/3ML nebulizer solution Take 1 ampule by nebulization every 6 (six) hours as needed. Reported on 07/30/2015    [provider]  albuterol (PROVENTIL HFA;VENTOLIN HFA) 108 (90 BASE) MCG/ACT inhaler Inhale 2 puffs into the lungs every 6 (six) hours as  needed for wheezing or shortness of breath. Reported on 02/06/2015    [provider]  alendronate (FOSAMAX) 70 MG tablet Take 1 tablet (70 mg total) by mouth once a week. Reported on 02/06/2015 03/10/16   Twana First, MD  budesonide-formoterol [facility]) 160-4.5 MCG/ACT inhaler Inhale 2 puffs into the lungs 2 (two) times daily. 09/23/12   Doree Albee, MD  Calcium Carbonate-Vitamin D (CALCIUM 600+D) 600-200 MG-UNIT TABS Take 1 tablet by mouth 2 (two) times daily.    [provider]  HYDROcodone-acetaminophen (NORCO/VICODIN) 5-325 MG tablet Take one tab po q 4-6 hrs prn pain 07/11/16   Raphael Fitzpatrick, PA-C  naproxen (NAPROSYN) 500 MG tablet Take 1 tablet (500 mg total) by mouth 2 (two) times daily with a meal. 07/11/16   Izmael Duross, PA-C    Family History Family History  Problem Relation Age of Onset  . Heart failure Father     Social History Social History  Substance Use Topics  . Smoking status: Current Every Day Smoker    Packs/day: 1.00    Types: Cigarettes  . Smokeless tobacco: Former Systems developer    Quit date: 07/31/2012  . Alcohol use Yes     Comment: occasionally     Allergies   Avelox [moxifloxacin hcl in nacl]   Review of Systems Review of Systems  Constitutional: Negative for chills and fever.  Genitourinary: Negative for difficulty urinating and dysuria.  Musculoskeletal: Positive for arthralgias and joint swelling.  Skin: Negative for color change and wound.  All other systems reviewed and are negative.    Physical Exam Updated Vital Signs BP 108/67 (BP Location: Right Arm)   Pulse 79   Temp 98.2 F (36.8 C) (Oral)   Resp 18   Ht '5\' 1"'$  (1.549 m)   Wt 42.6 kg (94 lb)   SpO2 98%   BMI 17.76 kg/m   Physical Exam  Constitutional: She is oriented to person, place, and time. She appears well-developed and well-nourished. No distress.  HENT:  Head: Normocephalic and atraumatic.  Cardiovascular: Normal rate, regular rhythm, normal heart  sounds and intact distal pulses.   Pulmonary/Chest: Effort normal and breath sounds normal. She exhibits no tenderness.  Musculoskeletal: She exhibits edema and tenderness.  ttp of the dorsal aspect of the bilateral ankle, left > right. Mild bruising on the right   No abrasion, or bony deformity.  No proximal tenderness.  Neurological: She is alert and oriented to person, place, and time. She exhibits normal muscle tone. Coordination normal.  Skin: Skin is warm and dry. Capillary refill takes less than 2 seconds.  Nursing note and vitals reviewed.    ED Treatments / Results  Labs (all labs ordered are listed, but only abnormal results are displayed) Labs Reviewed - No data to display  EKG  EKG Interpretation None       Radiology Dg Ankle Complete Left  Result Date: 07/11/2016 CLINICAL DATA:  Twisted left foot and ankle going down steps. EXAM: LEFT ANKLE COMPLETE - 3+ VIEW COMPARISON:  10/07/2015 FINDINGS: Chronic regularity over the distal tip of the fibula. Ankle mortise is within normal. There is a subtle chip fracture along the dorsal aspect of the navicular bone likely acute. Remainder of the exam is unchanged. IMPRESSION: Small acute chip fracture along the dorsal aspect of the navicular bone. Electronically Signed   By: Marin Olp M.D.   On: 07/11/2016 14:57   Dg Foot Complete Right  Result Date: 07/11/2016 CLINICAL DATA:  Twisted right foot and ankle going down steps. EXAM: RIGHT FOOT COMPLETE - 3+ VIEW COMPARISON:  None. FINDINGS: Examination demonstrates an old ununited fracture of the fourth middle phalanx. No evidence of acute fracture or dislocation. Mild diffuse osteopenia. Small bone island over the base of the fourth metatarsal. IMPRESSION: No acute findings. Electronically Signed   By: Marin Olp M.D.   On: 07/11/2016 14:55     Procedures Procedures (including critical care time)  Medications Ordered in ED Medications  HYDROcodone-acetaminophen  (NORCO/VICODIN) 5-325 MG per tablet 1 tablet (1 tablet Oral Given 07/11/16 1543)     Initial Impression / Assessment and Plan / ED Course  I have reviewed the triage vital signs and the nursing notes.  Pertinent labs & imaging results that were available during my care of the patient were reviewed by me and considered in my medical decision making (see chart for details).     Discussed XR findings.  NV intact.  compartments are soft.    Cam walker applied to the Left ankle.  ASO brace on the right.  Pt has crutches at home.  Pt agrees to orthopedic f/u.    Final Clinical Impressions(s) / ED Diagnoses   Final diagnoses:  Closed nondisplaced fracture of navicular bone of left foot, initial encounter  Acute right ankle pain    New Prescriptions Discharge  Medication List as of 07/11/2016  3:45 PM    START taking these medications   Details  naproxen (NAPROSYN) 500 MG tablet Take 1 tablet (500 mg total) by mouth 2 (two) times daily with a meal., Starting Sun 07/11/2016, Print         [place], Reeves, PA-C 07/14/16 1634    Nat Christen, MD 07/19/16 1415

## 2016-07-27 ENCOUNTER — Ambulatory Visit (INDEPENDENT_AMBULATORY_CARE_PROVIDER_SITE_OTHER): Payer: Medicare HMO

## 2016-07-27 ENCOUNTER — Ambulatory Visit (INDEPENDENT_AMBULATORY_CARE_PROVIDER_SITE_OTHER): Payer: Self-pay | Admitting: Orthopaedic Surgery

## 2016-07-27 ENCOUNTER — Encounter: Payer: Self-pay | Admitting: Orthopaedic Surgery

## 2016-07-27 DIAGNOSIS — S92255D Nondisplaced fracture of navicular [scaphoid] of left foot, subsequent encounter for fracture with routine healing: Secondary | ICD-10-CM | POA: Diagnosis not present

## 2016-07-27 NOTE — Progress Notes (Signed)
CC:  My right foot hurts now  She has little pain in the left foot.  She has been using the CAM walker.  NV intact.  X-rays were done, reported separately.  Encounter Diagnosis  Name Primary?  . Closed nondisplaced fracture of navicular bone of left foot with routine healing, subsequent encounter Yes   She may have pain in the right foot from the height difference of the CAM walker to her sandals.  Return in one month.  X-rays then.  She may stop the CAM walker.  Call if any problem.  Precautions discussed.   Electronically Signed Sanjuana Kava, MD 7/10/20182:08 PM

## 2016-08-04 DIAGNOSIS — J449 Chronic obstructive pulmonary disease, unspecified: Secondary | ICD-10-CM | POA: Diagnosis not present

## 2016-08-04 DIAGNOSIS — J329 Chronic sinusitis, unspecified: Secondary | ICD-10-CM | POA: Diagnosis not present

## 2016-08-04 DIAGNOSIS — Z681 Body mass index (BMI) 19 or less, adult: Secondary | ICD-10-CM | POA: Diagnosis not present

## 2016-08-04 DIAGNOSIS — J9801 Acute bronchospasm: Secondary | ICD-10-CM | POA: Diagnosis not present

## 2016-08-04 DIAGNOSIS — Z1389 Encounter for screening for other disorder: Secondary | ICD-10-CM | POA: Diagnosis not present

## 2016-08-04 DIAGNOSIS — J418 Mixed simple and mucopurulent chronic bronchitis: Secondary | ICD-10-CM | POA: Diagnosis not present

## 2016-08-04 DIAGNOSIS — R69 Illness, unspecified: Secondary | ICD-10-CM | POA: Diagnosis not present

## 2016-08-25 ENCOUNTER — Ambulatory Visit (INDEPENDENT_AMBULATORY_CARE_PROVIDER_SITE_OTHER): Payer: Medicare HMO

## 2016-08-25 ENCOUNTER — Encounter: Payer: Self-pay | Admitting: Orthopaedic Surgery

## 2016-08-25 ENCOUNTER — Ambulatory Visit (INDEPENDENT_AMBULATORY_CARE_PROVIDER_SITE_OTHER): Payer: Medicare HMO | Admitting: Orthopaedic Surgery

## 2016-08-25 DIAGNOSIS — S92255D Nondisplaced fracture of navicular [scaphoid] of left foot, subsequent encounter for fracture with routine healing: Secondary | ICD-10-CM

## 2016-08-25 NOTE — Progress Notes (Signed)
CC:  My foot is not hurting  She is doing well with the left foot.  She has no pain.  NV intact.  Gait normal.  Encounter Diagnosis  Name Primary?  . Closed nondisplaced fracture of navicular bone of left foot with routine healing, subsequent encounter Yes   X-rays were done, reported separately.  Discharge.  Electronically Signed Sanjuana Kava, MD 8/8/20181:43 PM

## 2016-10-04 DIAGNOSIS — Z719 Counseling, unspecified: Secondary | ICD-10-CM | POA: Diagnosis not present

## 2016-10-04 DIAGNOSIS — Z681 Body mass index (BMI) 19 or less, adult: Secondary | ICD-10-CM | POA: Diagnosis not present

## 2016-10-04 DIAGNOSIS — J449 Chronic obstructive pulmonary disease, unspecified: Secondary | ICD-10-CM | POA: Diagnosis not present

## 2016-10-07 DIAGNOSIS — Z0100 Encounter for examination of eyes and vision without abnormal findings: Secondary | ICD-10-CM | POA: Diagnosis not present

## 2016-10-07 DIAGNOSIS — H5210 Myopia, unspecified eye: Secondary | ICD-10-CM | POA: Diagnosis not present

## 2016-11-04 ENCOUNTER — Other Ambulatory Visit (HOSPITAL_COMMUNITY): Payer: Self-pay | Admitting: Oncology

## 2016-11-04 DIAGNOSIS — M810 Age-related osteoporosis without current pathological fracture: Secondary | ICD-10-CM

## 2016-11-09 ENCOUNTER — Other Ambulatory Visit (HOSPITAL_COMMUNITY): Payer: Self-pay | Admitting: Internal Medicine

## 2016-11-09 DIAGNOSIS — Z1231 Encounter for screening mammogram for malignant neoplasm of breast: Secondary | ICD-10-CM

## 2016-11-17 ENCOUNTER — Encounter (HOSPITAL_COMMUNITY): Payer: Self-pay

## 2016-11-17 ENCOUNTER — Ambulatory Visit (HOSPITAL_COMMUNITY)
Admission: RE | Admit: 2016-11-17 | Discharge: 2016-11-17 | Disposition: A | Discharge status: Home / Self Care | Payer: Medicare HMO | Source: Ambulatory Visit | Attending: Internal Medicine | Admitting: Internal Medicine

## 2016-11-17 DIAGNOSIS — Z1231 Encounter for screening mammogram for malignant neoplasm of breast: Secondary | ICD-10-CM | POA: Insufficient documentation

## 2016-11-19 ENCOUNTER — Other Ambulatory Visit (HOSPITAL_COMMUNITY): Payer: Self-pay

## 2016-11-19 DIAGNOSIS — M810 Age-related osteoporosis without current pathological fracture: Secondary | ICD-10-CM

## 2016-11-19 MED ORDER — ALENDRONATE SODIUM 70 MG PO TABS: 70.0000 mg | ORAL_TABLET | ORAL | 1 refills | Status: DC

## 2016-11-19 NOTE — Telephone Encounter (Signed)
Patient called for refill on fosamax. Reviewed with provider, chart checked, and refilled.

## 2016-12-02 DIAGNOSIS — Z23 Encounter for immunization: Secondary | ICD-10-CM | POA: Diagnosis not present

## 2016-12-02 DIAGNOSIS — R69 Illness, unspecified: Secondary | ICD-10-CM | POA: Diagnosis not present

## 2016-12-02 DIAGNOSIS — Z0000 Encounter for general adult medical examination without abnormal findings: Secondary | ICD-10-CM | POA: Diagnosis not present

## 2016-12-02 DIAGNOSIS — M545 Low back pain: Secondary | ICD-10-CM | POA: Diagnosis not present

## 2016-12-02 DIAGNOSIS — Z681 Body mass index (BMI) 19 or less, adult: Secondary | ICD-10-CM | POA: Diagnosis not present

## 2016-12-02 DIAGNOSIS — Z0001 Encounter for general adult medical examination with abnormal findings: Secondary | ICD-10-CM | POA: Diagnosis not present

## 2016-12-02 DIAGNOSIS — Z1389 Encounter for screening for other disorder: Secondary | ICD-10-CM | POA: Diagnosis not present

## 2016-12-06 DIAGNOSIS — E870 Hyperosmolality and hypernatremia: Secondary | ICD-10-CM | POA: Diagnosis not present

## 2016-12-15 ENCOUNTER — Other Ambulatory Visit: Payer: Self-pay

## 2016-12-15 ENCOUNTER — Encounter (HOSPITAL_COMMUNITY): Payer: Medicare HMO | Attending: Oncology

## 2016-12-15 ENCOUNTER — Encounter (HOSPITAL_COMMUNITY): Payer: Self-pay

## 2016-12-15 ENCOUNTER — Encounter (HOSPITAL_BASED_OUTPATIENT_CLINIC_OR_DEPARTMENT_OTHER): Payer: Medicare HMO | Admitting: Oncology

## 2016-12-15 VITALS — BP 126/74 | HR 81 | Temp 98.1°F | Resp 18 | Wt 94.2 lb

## 2016-12-15 DIAGNOSIS — Z791 Long term (current) use of non-steroidal anti-inflammatories (NSAID): Secondary | ICD-10-CM | POA: Insufficient documentation

## 2016-12-15 DIAGNOSIS — M545 Low back pain: Secondary | ICD-10-CM | POA: Diagnosis not present

## 2016-12-15 DIAGNOSIS — Z7983 Long term (current) use of bisphosphonates: Secondary | ICD-10-CM | POA: Diagnosis not present

## 2016-12-15 DIAGNOSIS — F17210 Nicotine dependence, cigarettes, uncomplicated: Secondary | ICD-10-CM | POA: Insufficient documentation

## 2016-12-15 DIAGNOSIS — Z881 Allergy status to other antibiotic agents status: Secondary | ICD-10-CM | POA: Insufficient documentation

## 2016-12-15 DIAGNOSIS — Z720 Tobacco use: Secondary | ICD-10-CM

## 2016-12-15 DIAGNOSIS — Z90710 Acquired absence of both cervix and uterus: Secondary | ICD-10-CM | POA: Diagnosis not present

## 2016-12-15 DIAGNOSIS — R69 Illness, unspecified: Secondary | ICD-10-CM | POA: Diagnosis not present

## 2016-12-15 DIAGNOSIS — D472 Monoclonal gammopathy: Secondary | ICD-10-CM

## 2016-12-15 DIAGNOSIS — M810 Age-related osteoporosis without current pathological fracture: Secondary | ICD-10-CM | POA: Diagnosis not present

## 2016-12-15 DIAGNOSIS — M818 Other osteoporosis without current pathological fracture: Secondary | ICD-10-CM

## 2016-12-15 DIAGNOSIS — J449 Chronic obstructive pulmonary disease, unspecified: Secondary | ICD-10-CM | POA: Insufficient documentation

## 2016-12-15 LAB — COMPREHENSIVE METABOLIC PANEL
ALT: 15 U/L (ref 14–54)
ANION GAP: 7 (ref 5–15)
AST: 23 U/L (ref 15–41)
Albumin: 4.3 g/dL (ref 3.5–5.0)
Alkaline Phosphatase: 65 U/L (ref 38–126)
BILIRUBIN TOTAL: 0.5 mg/dL (ref 0.3–1.2)
BUN: 20 mg/dL (ref 6–20)
CO2: 22 mmol/L (ref 22–32)
Calcium: 9.5 mg/dL (ref 8.9–10.3)
Chloride: 107 mmol/L (ref 101–111)
Creatinine, Ser: 0.78 mg/dL (ref 0.44–1.00)
GFR calc Af Amer: >60 mL/min (ref >60)
Glucose, Bld: 98 mg/dL (ref 65–99)
POTASSIUM: 3.7 mmol/L (ref 3.5–5.1)
Sodium: 136 mmol/L (ref 135–145)
TOTAL PROTEIN: 8.3 g/dL — AB (ref 6.5–8.1)

## 2016-12-15 LAB — CBC WITH DIFFERENTIAL/PLATELET
Basophils Absolute: 0.1 10*3/uL (ref 0.0–0.1)
Basophils Relative: 1 %
Eosinophils Absolute: 0.7 10*3/uL (ref 0.0–0.7)
Eosinophils Relative: 10 %
HEMATOCRIT: 42.2 % (ref 36.0–46.0)
Hemoglobin: 13.7 g/dL (ref 12.0–15.0)
LYMPHS PCT: 31 %
Lymphs Abs: 2.3 10*3/uL (ref 0.7–4.0)
MCH: 30.4 pg (ref 26.0–34.0)
MCHC: 32.5 g/dL (ref 30.0–36.0)
MCV: 93.6 fL (ref 78.0–100.0)
MONO ABS: 0.4 10*3/uL (ref 0.1–1.0)
MONOS PCT: 5 %
NEUTROS ABS: 4 10*3/uL (ref 1.7–7.7)
Neutrophils Relative %: 53 %
Platelets: 302 10*3/uL (ref 150–400)
RBC: 4.51 MIL/uL (ref 3.87–5.11)
RDW: 13.1 % (ref 11.5–15.5)
WBC: 7.6 10*3/uL (ref 4.0–10.5)

## 2016-12-15 NOTE — Progress Notes (Signed)
      Fusco, Lawrence, MD [address] [place] [address]  No diagnosis found.   CURRENT THERAPY: Observation  INTERVAL HISTORY: Misty Richardson 61 y.o. female returns for followup of MGUS with 3% to 7% marrow plasma cells. Her myeloma markers were slowly increasing and her osteoporosis has shown worsening. She underwent a repeat BMBX in March 2016 with 9% plasma cells.   Patient presents today for continued follow-up of her MGUS.  She states that overall she has been doing well.  She still has pain in her left ankle from her previous fracture, for which she is currently taking meloxicam.  She also has lower back pain as well.  Skeletal survey performed on 06/09/2016 was negative for lytic lesions.  Patient continues to smoke but has cut down to half a pack per day.  She states that she is having more shortness of breath due to the cold weather.  She denies any recent infections, chest pain, abdominal pain, nausea, vomiting, diarrhea, focal weakness, headaches, blurred vision.  Past Medical History:  Diagnosis Date  . COPD (chronic obstructive pulmonary disease) ([facility])   . COPD (chronic obstructive pulmonary disease) ([facility]) 09/05/2012  . MGUS (monoclonal gammopathy of unknown significance) 09/05/2012  . Osteoporosis 03/14/2014    has MGUS (monoclonal gammopathy of unknown significance); COPD (chronic obstructive pulmonary disease) ([facility]); Tobacco use disorder; and Osteoporosis on their problem list.     is allergic to avelox [moxifloxacin hcl in nacl].  Breckyn W. Wageman had no medications administered during this visit.  Past Surgical History:  Procedure Laterality Date  . ABDOMINAL HYSTERECTOMY    . APPENDECTOMY    . BONE BIOPSY Left 03/25/14  . BONE MARROW ASPIRATION Left 03/25/14  . BONE MARROW BIOPSY Left March 2016  . CESAREAN SECTION      Review of Systems  Constitutional: Negative.   HENT: Negative.   Eyes: Negative.   Respiratory: Negative for shortness of  breath.   Cardiovascular: Negative.  Negative for chest pain.  Gastrointestinal: Negative.  Negative for abdominal pain.  Genitourinary: Negative.   Musculoskeletal: Negative for back pain and joint pain.  Skin: Negative.   Neurological: Negative.   Endo/Heme/Allergies: Negative.   Psychiatric/Behavioral: Negative.   All other systems reviewed and are negative. 14 point review of systems was performed and is negative except as detailed under history of present illness and above  PHYSICAL EXAMINATION  ECOG PERFORMANCE STATUS: 0 - Asymptomatic  Vitals:   12/15/16 1354  BP: 126/74  Pulse: 81  Resp: 18  Temp: 98.1 F (36.7 C)  SpO2: 97%   Physical Exam  Constitutional: She is oriented to person, place, and time and well-developed, well-nourished, and in no distress.  HENT:  Head: Normocephalic and atraumatic.  Mouth/Throat: Oropharynx is clear and moist.  Eyes: Conjunctivae and EOM are normal. Pupils are equal, round, and reactive to light. Right eye exhibits no discharge. Left eye exhibits no discharge. No scleral icterus.  Neck: Normal range of motion. Neck supple. No thyromegaly present.  Cardiovascular: Normal rate, regular rhythm and normal heart sounds.  Pulmonary/Chest: Effort normal. No respiratory distress. She has no wheezes. She has no rales. She exhibits no tenderness.  Abdominal: Soft. Bowel sounds are normal. She exhibits no distension and no mass. There is no tenderness. There is no rebound and no guarding.  Musculoskeletal: Normal range of motion. She exhibits no edema.  Lymphadenopathy:    She has no cervical adenopathy.  Neurological: She is alert and oriented   to person, place, and time. No cranial nerve deficit. Gait normal. Coordination normal.  Skin: Skin is warm and dry. No erythema.  Psychiatric: Mood, memory, affect and judgment normal.  Nursing note and vitals reviewed.  LABORATORY DATA: I have reviewed the data as listed  CBC    Component Value  Date/Time   WBC 7.6 12/15/2016 1321   RBC 4.51 12/15/2016 1321   HGB 13.7 12/15/2016 1321   HCT 42.2 12/15/2016 1321   PLT 302 12/15/2016 1321   MCV 93.6 12/15/2016 1321   MCH 30.4 12/15/2016 1321   MCHC 32.5 12/15/2016 1321   RDW 13.1 12/15/2016 1321   LYMPHSABS 2.3 12/15/2016 1321   MONOABS 0.4 12/15/2016 1321   EOSABS 0.7 12/15/2016 1321   BASOSABS 0.1 12/15/2016 1321      Chemistry      Component Value Date/Time   NA 136 12/15/2016 1321   K 3.7 12/15/2016 1321   CL 107 12/15/2016 1321   CO2 22 12/15/2016 1321   BUN 20 12/15/2016 1321   CREATININE 0.78 12/15/2016 1321      Component Value Date/Time   CALCIUM 9.5 12/15/2016 1321   ALKPHOS 65 12/15/2016 1321   AST 23 12/15/2016 1321   ALT 15 12/15/2016 1321   BILITOT 0.5 12/15/2016 1321     PATHOLOGY:   RADIOGRAPHY: I have personally reviewed the radiological images as listed and agreed with the findings in the report. Screening Mammogram Bilateral 11/13/2015  IMPRESSION: No mammographic evidence of malignancy. A result letter of this screening mammogram will be mailed directly to the patient.  CT Head w/o Contrast 07/06/2015 IMPRESSION: No acute intracranial abnormalities. Right periorbital scalp hematoma. Opacification of some of the ethmoid air cells and of the left mastoid air cells. INAL DIAGNOSIS ASSESSMENT and THERAPY PLAN:  IgG kappa MGUS MGUS with 9% plasma cells on BMBX Osteoporosis Normal CBC, normal kidney function, no hypercalcemia Abnormal kappa/lambda light chain ratio/M-spike 1.2 gm/dl Tobacco Abuse  Patient is asymptomatic. Very slow progression on her SPEP; M spike 1.7 (May 2018) from 1.8 (February 2018). SPEP pending from today. Reviewed the labs with the patient. No CRAB symptoms.    Skeletal survey performed on 06/09/2016 was negative for lytic lesions. Repeat skeletal survey in May 2019.  Continue calcium-vitamin D and fosamax for her osetoporosis.  She will return for follow up in  4 months with labs; SPEP, CBC, CMP, Beta 2 microglobulin, light chains.   Orders Placed This Encounter  Procedures  . CBC with Differential    Standing Status:   Future    Standing Expiration Date:   12/15/2017  . Comprehensive metabolic panel    Standing Status:   Future    Standing Expiration Date:   12/15/2017  . Kappa/lambda light chains    Standing Status:   Future    Standing Expiration Date:   12/15/2017  . Beta 2 microglobuline, serum    Standing Status:   Future    Standing Expiration Date:   12/15/2017  . Multiple Myeloma Panel (SPEP&IFE w/QIG)    Standing Status:   Future    Standing Expiration Date:   12/15/2017   All questions were answered. The patient knows to call the clinic with any problems, questions or concerns. We can certainly see the patient much sooner if necessary.    Twana First, MD  12/15/2016  This note was electronically signed

## 2016-12-15 NOTE — Patient Instructions (Signed)
[facility] Discharge Instructions  RECOMMENDATIONS MADE BY THE CONSULTANT AND ANY TEST RESULTS WILL BE SENT TO YOUR REFERRING PHYSICIAN.  You were seen today by Dr. Louise Zhou    Thank you for choosing [facility] at [facility] to provide your oncology and hematology care.  To afford each patient quality time with our provider, please arrive at least 15 minutes before your scheduled appointment time.    If you have a lab appointment with the Cancer Center please come in thru the  Main Entrance and check in at the main information desk  You need to re-schedule your appointment should you arrive 10 or more minutes late.  We strive to give you quality time with our providers, and arriving late affects you and other patients whose appointments are after yours.  Also, if you no show three or more times for appointments you may be dismissed from the clinic at the providers discretion.     Again, thank you for choosing [facility].  Our hope is that these requests will decrease the amount of time that you wait before being seen by our physicians.       _____________________________________________________________  Should you have questions after your visit to [facility], please contact our office at [phone] between the hours of 8:30 a.m. and 4:30 p.m.  Voicemails left after 4:30 p.m. will not be returned until the following business day.  For prescription refill requests, have your pharmacy contact our office.       Resources For Cancer Patients and their Caregivers ? American Cancer Society: Can assist with transportation, wigs, general needs, runs Look Good Feel Better.        1-888-227-6333 ? Cancer Care: Provides financial assistance, online support groups, medication/co-pay assistance.  1-800-813-HOPE (4673) ? [facility] Assists [place] cancer patients and their  families through emotional , educational and financial support.  336-427-4357 ? [place] DSS Where to apply for food stamps, Medicaid and utility assistance. 336-342-1394 ? [facility]: Transportation to medical appointments. 336-347-2287 ? Social Security Administration: May apply for disability if have a Stage IV cancer. 336-342-7796 1-800-772-1213 ? [place] Aging, Disability and Transit Services: Assists with nutrition, care and transit needs. 336-349-2343  Cancer Center Support Programs: @[email or web address]@ > Cancer Support Group  2nd Tuesday of the month 1pm-2pm, Journey Room  > Creative Journey  3rd Tuesday of the month 1130am-1pm, Journey Room  > Look Good Feel Better  1st Wednesday of the month 10am-12 noon, Journey Room (Call American Cancer Society to register 1-800-395-5775)    

## 2016-12-16 LAB — PROTEIN ELECTROPHORESIS, SERUM
A/G Ratio: 1 (ref 0.7–1.7)
Albumin ELP: 3.8 g/dL (ref 2.9–4.4)
Alpha-1-Globulin: 0.3 g/dL (ref 0.0–0.4)
Alpha-2-Globulin: 0.7 g/dL (ref 0.4–1.0)
Beta Globulin: 0.8 g/dL (ref 0.7–1.3)
GAMMA GLOBULIN: 2 g/dL — AB (ref 0.4–1.8)
GLOBULIN, TOTAL: 3.8 g/dL (ref 2.2–3.9)
M-SPIKE, %: 1.8 g/dL — AB
TOTAL PROTEIN ELP: 7.6 g/dL (ref 6.0–8.5)

## 2016-12-16 LAB — KAPPA/LAMBDA LIGHT CHAINS
Kappa free light chain: 324.7 mg/L — ABNORMAL HIGH (ref 3.3–19.4)
Kappa, lambda light chain ratio: 55.98 — ABNORMAL HIGH (ref 0.26–1.65)
Lambda free light chains: 5.8 mg/L (ref 5.7–26.3)

## 2016-12-16 LAB — IGG, IGA, IGM
IGA: 24 mg/dL — AB (ref 87–352)
IGG (IMMUNOGLOBIN G), SERUM: 2179 mg/dL — AB (ref 700–1600)
IgM (Immunoglobulin M), Srm: 28 mg/dL (ref 26–217)

## 2016-12-16 LAB — BETA 2 MICROGLOBULIN, SERUM: BETA 2 MICROGLOBULIN: 2.1 mg/L (ref 0.6–2.4)

## 2017-01-05 DIAGNOSIS — J209 Acute bronchitis, unspecified: Secondary | ICD-10-CM | POA: Diagnosis not present

## 2017-01-05 DIAGNOSIS — H6692 Otitis media, unspecified, left ear: Secondary | ICD-10-CM | POA: Diagnosis not present

## 2017-01-05 DIAGNOSIS — J069 Acute upper respiratory infection, unspecified: Secondary | ICD-10-CM | POA: Diagnosis not present

## 2017-03-23 DIAGNOSIS — R69 Illness, unspecified: Secondary | ICD-10-CM | POA: Diagnosis not present

## 2017-03-25 DIAGNOSIS — R69 Illness, unspecified: Secondary | ICD-10-CM | POA: Diagnosis not present

## 2017-03-31 DIAGNOSIS — R69 Illness, unspecified: Secondary | ICD-10-CM | POA: Diagnosis not present

## 2017-03-31 DIAGNOSIS — J309 Allergic rhinitis, unspecified: Secondary | ICD-10-CM | POA: Diagnosis not present

## 2017-03-31 DIAGNOSIS — Z681 Body mass index (BMI) 19 or less, adult: Secondary | ICD-10-CM | POA: Diagnosis not present

## 2017-03-31 DIAGNOSIS — Z1389 Encounter for screening for other disorder: Secondary | ICD-10-CM | POA: Diagnosis not present

## 2017-04-06 ENCOUNTER — Other Ambulatory Visit (HOSPITAL_COMMUNITY): Payer: Self-pay | Admitting: *Deleted

## 2017-04-06 DIAGNOSIS — D472 Monoclonal gammopathy: Secondary | ICD-10-CM

## 2017-04-07 ENCOUNTER — Inpatient Hospital Stay (HOSPITAL_COMMUNITY): Payer: Medicare HMO | Attending: Internal Medicine

## 2017-04-07 DIAGNOSIS — Z79899 Other long term (current) drug therapy: Secondary | ICD-10-CM | POA: Insufficient documentation

## 2017-04-07 DIAGNOSIS — D472 Monoclonal gammopathy: Secondary | ICD-10-CM | POA: Diagnosis present

## 2017-04-07 DIAGNOSIS — M818 Other osteoporosis without current pathological fracture: Secondary | ICD-10-CM | POA: Insufficient documentation

## 2017-04-07 LAB — COMPREHENSIVE METABOLIC PANEL
ALT: 24 U/L (ref 14–54)
AST: 25 U/L (ref 15–41)
Albumin: 3.9 g/dL (ref 3.5–5.0)
Alkaline Phosphatase: 74 U/L (ref 38–126)
Anion gap: 11 (ref 5–15)
BILIRUBIN TOTAL: 0.6 mg/dL (ref 0.3–1.2)
BUN: 20 mg/dL (ref 6–20)
CO2: 20 mmol/L — ABNORMAL LOW (ref 22–32)
CREATININE: 0.85 mg/dL (ref 0.44–1.00)
Calcium: 9 mg/dL (ref 8.9–10.3)
Chloride: 103 mmol/L (ref 101–111)
Glucose, Bld: 133 mg/dL — ABNORMAL HIGH (ref 65–99)
Potassium: 3.1 mmol/L — ABNORMAL LOW (ref 3.5–5.1)
Sodium: 134 mmol/L — ABNORMAL LOW (ref 135–145)
TOTAL PROTEIN: 8.6 g/dL — AB (ref 6.5–8.1)

## 2017-04-07 LAB — CBC WITH DIFFERENTIAL/PLATELET
BASOS ABS: 0.1 10*3/uL (ref 0.0–0.1)
Basophils Relative: 1 %
Eosinophils Absolute: 0.4 10*3/uL (ref 0.0–0.7)
Eosinophils Relative: 4 %
HEMATOCRIT: 43.8 % (ref 36.0–46.0)
Hemoglobin: 14.4 g/dL (ref 12.0–15.0)
LYMPHS ABS: 2.7 10*3/uL (ref 0.7–4.0)
LYMPHS PCT: 29 %
MCH: 30.2 pg (ref 26.0–34.0)
MCHC: 32.9 g/dL (ref 30.0–36.0)
MCV: 91.8 fL (ref 78.0–100.0)
MONO ABS: 0.5 10*3/uL (ref 0.1–1.0)
MONOS PCT: 6 %
NEUTROS ABS: 5.6 10*3/uL (ref 1.7–7.7)
Neutrophils Relative %: 60 %
Platelets: 324 10*3/uL (ref 150–400)
RBC: 4.77 MIL/uL (ref 3.87–5.11)
RDW: 13.8 % (ref 11.5–15.5)
WBC: 9.2 10*3/uL (ref 4.0–10.5)

## 2017-04-08 LAB — BETA 2 MICROGLOBULIN, SERUM: BETA 2 MICROGLOBULIN: 2.2 mg/L (ref 0.6–2.4)

## 2017-04-08 LAB — KAPPA/LAMBDA LIGHT CHAINS
KAPPA FREE LGHT CHN: 473.3 mg/L — AB (ref 3.3–19.4)
Kappa, lambda light chain ratio: 68.59 — ABNORMAL HIGH (ref 0.26–1.65)
LAMDA FREE LIGHT CHAINS: 6.9 mg/L (ref 5.7–26.3)

## 2017-04-11 LAB — MULTIPLE MYELOMA PANEL, SERUM
ALBUMIN SERPL ELPH-MCNC: 4.1 g/dL (ref 2.9–4.4)
ALPHA 1: 0.3 g/dL (ref 0.0–0.4)
ALPHA2 GLOB SERPL ELPH-MCNC: 0.9 g/dL (ref 0.4–1.0)
Albumin/Glob SerPl: 1 (ref 0.7–1.7)
B-GLOBULIN SERPL ELPH-MCNC: 0.9 g/dL (ref 0.7–1.3)
GAMMA GLOB SERPL ELPH-MCNC: 2.3 g/dL — AB (ref 0.4–1.8)
GLOBULIN, TOTAL: 4.3 g/dL — AB (ref 2.2–3.9)
IGG (IMMUNOGLOBIN G), SERUM: 2688 mg/dL — AB (ref 700–1600)
IgA: 26 mg/dL — ABNORMAL LOW (ref 87–352)
IgM (Immunoglobulin M), Srm: 38 mg/dL (ref 26–217)
M PROTEIN SERPL ELPH-MCNC: 2.1 g/dL — AB
Total Protein ELP: 8.4 g/dL (ref 6.0–8.5)

## 2017-04-14 ENCOUNTER — Other Ambulatory Visit: Payer: Self-pay

## 2017-04-14 ENCOUNTER — Encounter (HOSPITAL_COMMUNITY): Payer: Self-pay | Admitting: Hematology

## 2017-04-14 ENCOUNTER — Inpatient Hospital Stay (HOSPITAL_BASED_OUTPATIENT_CLINIC_OR_DEPARTMENT_OTHER): Payer: Medicare HMO | Admitting: Hematology

## 2017-04-14 VITALS — BP 106/58 | HR 80 | Temp 97.7°F | Resp 16 | Wt 91.6 lb

## 2017-04-14 DIAGNOSIS — M818 Other osteoporosis without current pathological fracture: Secondary | ICD-10-CM | POA: Diagnosis not present

## 2017-04-14 DIAGNOSIS — D472 Monoclonal gammopathy: Secondary | ICD-10-CM | POA: Diagnosis not present

## 2017-04-14 DIAGNOSIS — Z79899 Other long term (current) drug therapy: Secondary | ICD-10-CM | POA: Diagnosis not present

## 2017-04-14 NOTE — Progress Notes (Signed)
Patient Care Team: Redmond School, MD as PCP - General (Internal Medicine)  DIAGNOSIS:  Encounter Diagnosis  Name Primary?  Marland Kitchen MGUS (monoclonal gammopathy of unknown significance) Yes    CHIEF COMPLIANT: Follow-up of MGUS.  INTERVAL HISTORY: Misty Richardson is a very pleasant 62 year old female seen in follow-up for MGUS.  She denies any new onset bone pains.  She denies any fevers, night sweats or weight loss in the last 6 months.  She is continuing to take Fosamax without any problems.  She denies any recent hospitalizations.  REVIEW OF SYSTEMS:   Constitutional: Denies fevers, chills or abnormal weight loss.  Mild fatigue reported. Eyes: Denies blurriness of vision Ears, nose, mouth, throat, and face: Denies mucositis or sore throat Respiratory: Some shortness of breath on exertion from COPD. Cardiovascular: Denies palpitation, chest discomfort Gastrointestinal:  Denies nausea, heartburn or change in bowel habits Skin: Denies abnormal skin rashes Lymphatics: Denies new lymphadenopathy or easy bruising Neurological:Denies numbness, tingling or new weaknesses Behavioral/Psych: Mood is stable, no new changes  Extremities: No lower extremity edema All other systems were reviewed with the patient and are negative.  I have reviewed the past medical history, past surgical history, social history and family history with the patient and they are unchanged from previous note.  ALLERGIES:  is allergic to avelox [moxifloxacin hcl in nacl].  MEDICATIONS:  Current Outpatient Medications  Medication Sig Dispense Refill  . albuterol (ACCUNEB) 1.25 MG/3ML nebulizer solution Take 1 ampule by nebulization every 6 (six) hours as needed. Reported on 07/30/2015    . albuterol (PROVENTIL HFA;VENTOLIN HFA) 108 (90 BASE) MCG/ACT inhaler Inhale 2 puffs into the lungs every 6 (six) hours as needed for wheezing or shortness of breath. Reported on 02/06/2015    . alendronate (FOSAMAX) 70 MG tablet Take  1 tablet (70 mg total) by mouth once a week. Reported on 02/06/2015 12 tablet 1  . ALPRAZolam (XANAX) 1 MG tablet     . budesonide-formoterol (SYMBICORT) 160-4.5 MCG/ACT inhaler Inhale 2 puffs into the lungs 2 (two) times daily. 1 Inhaler 1  . Calcium Carbonate-Vitamin D (CALCIUM 600+D) 600-200 MG-UNIT TABS Take 1 tablet by mouth 2 (two) times daily.    Marland Kitchen gabapentin (NEURONTIN) 300 MG capsule     . HYDROcodone-acetaminophen (NORCO/VICODIN) 5-325 MG tablet Take one tab po q 4-6 hrs prn pain (Patient not taking: Reported on 04/14/2017) 20 tablet 0  . meloxicam (MOBIC) 15 MG tablet      No current facility-administered medications for this visit.     PHYSICAL EXAMINATION: ECOG PERFORMANCE STATUS: 1 - Symptomatic but completely ambulatory  Vitals:   04/14/17 1452  BP: (!) 106/58  Pulse: 80  Resp: 16  Temp: 97.7 F (36.5 C)  SpO2: 100%   Filed Weights   04/14/17 1452  Weight: 91 lb 9.6 oz (41.5 kg)    GENERAL:alert, no distress and comfortable SKIN: skin color, texture, turgor are normal, no rashes or significant lesions EYES: normal, Conjunctiva are pink and non-injected, sclera clear NECK: supple, thyroid normal size, non-tender, without nodularity LYMPH:  no palpable lymphadenopathy in the cervical, axillary or inguinal LUNGS: clear to auscultation and percussion with normal breathing effort HEART: regular rate & rhythm and no murmurs and no lower extremity edema ABDOMEN:abdomen soft, non-tender and normal bowel sounds MUSCULOSKELETAL:no cyanosis of digits and no clubbing   EXTREMITIES: No lower extremity edema LABORATORY DATA:  I have reviewed the data as listed CMP Latest Ref Rng & Units 04/07/2017 12/15/2016 06/09/2016  Glucose 65 -  99 mg/dL 133(H) 98 90  BUN 6 - 20 mg/dL '20 20 13  '$ Creatinine 0.44 - 1.00 mg/dL 0.85 0.78 0.74  Sodium 135 - 145 mmol/L 134(L) 136 134(L)  Potassium 3.5 - 5.1 mmol/L 3.1(L) 3.7 3.9  Chloride 101 - 111 mmol/L 103 107 104  CO2 22 - 32 mmol/L  20(L) 22 24  Calcium 8.9 - 10.3 mg/dL 9.0 9.5 9.4  Total Protein 6.5 - 8.1 g/dL 8.6(H) 8.3(H) 8.1  Total Bilirubin 0.3 - 1.2 mg/dL 0.6 0.5 0.4  Alkaline Phos 38 - 126 U/L 74 65 62  AST 15 - 41 U/L '25 23 19  '$ ALT 14 - 54 U/L '24 15 14   '$ No results found for: [id number]   Lab Results  Component Value Date   WBC 9.2 04/07/2017   HGB 14.4 04/07/2017   HCT 43.8 04/07/2017   MCV 91.8 04/07/2017   PLT 324 04/07/2017   NEUTROABS 5.6 04/07/2017    ASSESSMENT & PLAN:  MGUS (monoclonal gammopathy of unknown significance) 1.  IgG kappa MGUS: Last bone marrow biopsy was done in March 2016 showing 9% plasma cells with kappa restriction.  Last skeletal survey was in May 2018 which did not show any lytic lesions, but showed osteoporosis.  I discussed the findings of the M spike with the patient in detail.  This went up from 1.8 g/dL in November 2018 to 2.1 g/dL.  Kappa free light chains have also increased from 324 to 473.  The ratio has gone up from 55 to 68.  However her hemoglobin is within normal limits.  Her creatinine and calcium were also within normal limits.  As she does not have "crab" features, we will continue to watch closely.  I have asked her to come back in 3 months with a repeat blood work as well as Marine scientist.  If there is any progression to myeloma, she will need further workup including bone marrow biopsy with FISH panel, and other radiological studies.  2.  Osteoporosis: She is continuing Fosamax weekly.  She has history of compression fractures of lumbar vertebra.       Orders Placed This Encounter  Procedures  . DG Bone Survey Met    Standing Status:   Future    Standing Expiration Date:   06/14/2018    Order Specific Question:   Reason for exam:    Answer:   work up for myeloma    Order Specific Question:   Preferred imaging location?    Answer:   [facility]   The patient has a good understanding of the overall plan. she agrees with it. she will call with any  problems that may develop before the next visit here.   Derek Jack, MD 04/14/17

## 2017-04-14 NOTE — Patient Instructions (Signed)
[facility] at [facility] Discharge Instructions  You were seen today by Dr. Katragadda    Thank you for choosing [facility] at [facility] to provide your oncology and hematology care.  To afford each patient quality time with our provider, please arrive at least 15 minutes before your scheduled appointment time.   If you have a lab appointment with the Cancer Center please come in thru the  Main Entrance and check in at the main information desk  You need to re-schedule your appointment should you arrive 10 or more minutes late.  We strive to give you quality time with our providers, and arriving late affects you and other patients whose appointments are after yours.  Also, if you no show three or more times for appointments you may be dismissed from the clinic at the providers discretion.     Again, thank you for choosing [facility].  Our hope is that these requests will decrease the amount of time that you wait before being seen by our physicians.       _____________________________________________________________  Should you have questions after your visit to [facility], please contact our office at [phone] between the hours of 8:30 a.m. and 4:30 p.m.  Voicemails left after 4:30 p.m. will not be returned until the following business day.  For prescription refill requests, have your pharmacy contact our office.       Resources For Cancer Patients and their Caregivers ? American Cancer Society: Can assist with transportation, wigs, general needs, runs Look Good Feel Better.        1-888-227-6333 ? Cancer Care: Provides financial assistance, online support groups, medication/co-pay assistance.  1-800-813-HOPE (4673) ? [facility] Assists [place] cancer patients and their families through emotional , educational and financial support.  336-427-4357 ? [place] DSS Where to  apply for food stamps, Medicaid and utility assistance. 336-342-1394 ? [facility]: Transportation to medical appointments. 336-347-2287 ? Social Security Administration: May apply for disability if have a Stage IV cancer. 336-342-7796 1-800-772-1213 ? [place] Aging, Disability and Transit Services: Assists with nutrition, care and transit needs. 336-349-2343  Cancer Center Support Programs:   > Cancer Support Group  2nd Tuesday of the month 1pm-2pm, Journey Room   > Creative Journey  3rd Tuesday of the month 1130am-1pm, Journey Room    

## 2017-04-14 NOTE — Assessment & Plan Note (Signed)
1.  IgG kappa MGUS: Last bone marrow biopsy was done in March 2016 showing 9% plasma cells with kappa restriction.  Last skeletal survey was in May 2018 which did not show any lytic lesions, but showed osteoporosis.  I discussed the findings of the M spike with the patient in detail.  This went up from 1.8 g/dL in November 2018 to 2.1 g/dL.  Kappa free light chains have also increased from 324 to 473.  The ratio has gone up from 55 to 68.  However her hemoglobin is within normal limits.  Her creatinine and calcium were also within normal limits.  As she does not have "crab" features, we will continue to watch closely.  I have asked her to come back in 3 months with a repeat blood work as well as Marine scientist.  If there is any progression to myeloma, she will need further workup including bone marrow biopsy with FISH panel, and other radiological studies.  2.  Osteoporosis: She is continuing Fosamax weekly.  She has history of compression fractures of lumbar vertebra.

## 2017-04-27 DIAGNOSIS — B078 Other viral warts: Secondary | ICD-10-CM | POA: Diagnosis not present

## 2017-04-27 DIAGNOSIS — B079 Viral wart, unspecified: Secondary | ICD-10-CM | POA: Diagnosis not present

## 2017-05-25 DIAGNOSIS — J441 Chronic obstructive pulmonary disease with (acute) exacerbation: Secondary | ICD-10-CM | POA: Diagnosis not present

## 2017-05-25 DIAGNOSIS — J069 Acute upper respiratory infection, unspecified: Secondary | ICD-10-CM | POA: Diagnosis not present

## 2017-05-25 DIAGNOSIS — J029 Acute pharyngitis, unspecified: Secondary | ICD-10-CM | POA: Diagnosis not present

## 2017-06-01 DIAGNOSIS — R69 Illness, unspecified: Secondary | ICD-10-CM | POA: Diagnosis not present

## 2017-06-01 DIAGNOSIS — J029 Acute pharyngitis, unspecified: Secondary | ICD-10-CM | POA: Diagnosis not present

## 2017-06-01 DIAGNOSIS — Z681 Body mass index (BMI) 19 or less, adult: Secondary | ICD-10-CM | POA: Diagnosis not present

## 2017-06-01 DIAGNOSIS — J449 Chronic obstructive pulmonary disease, unspecified: Secondary | ICD-10-CM | POA: Diagnosis not present

## 2017-07-11 ENCOUNTER — Inpatient Hospital Stay (HOSPITAL_COMMUNITY): Payer: Medicare HMO | Attending: Hematology

## 2017-07-11 ENCOUNTER — Ambulatory Visit (HOSPITAL_COMMUNITY)
Admission: RE | Admit: 2017-07-11 | Discharge: 2017-07-11 | Disposition: A | Discharge status: Home / Self Care | Payer: Medicare HMO | Source: Ambulatory Visit | Attending: Hematology | Admitting: Hematology

## 2017-07-11 DIAGNOSIS — M438X4 Other specified deforming dorsopathies, thoracic region: Secondary | ICD-10-CM | POA: Diagnosis not present

## 2017-07-11 DIAGNOSIS — D472 Monoclonal gammopathy: Secondary | ICD-10-CM | POA: Diagnosis not present

## 2017-07-11 LAB — COMPREHENSIVE METABOLIC PANEL
ALK PHOS: 55 U/L (ref 38–126)
ALT: 18 U/L (ref 14–54)
ANION GAP: 6 (ref 5–15)
AST: 23 U/L (ref 15–41)
Albumin: 3.8 g/dL (ref 3.5–5.0)
BUN: 21 mg/dL — ABNORMAL HIGH (ref 6–20)
CALCIUM: 8.9 mg/dL (ref 8.9–10.3)
CO2: 26 mmol/L (ref 22–32)
Chloride: 108 mmol/L (ref 101–111)
Creatinine, Ser: 0.7 mg/dL (ref 0.44–1.00)
GFR calc non Af Amer: >60 mL/min (ref >60)
Glucose, Bld: 98 mg/dL (ref 65–99)
Potassium: 3.5 mmol/L (ref 3.5–5.1)
SODIUM: 140 mmol/L (ref 135–145)
TOTAL PROTEIN: 7.6 g/dL (ref 6.5–8.1)
Total Bilirubin: 0.5 mg/dL (ref 0.3–1.2)

## 2017-07-11 LAB — CBC WITH DIFFERENTIAL/PLATELET
BASOS ABS: 0.1 10*3/uL (ref 0.0–0.1)
BASOS PCT: 1 %
Eosinophils Absolute: 0.3 10*3/uL (ref 0.0–0.7)
Eosinophils Relative: 5 %
HEMATOCRIT: 40.5 % (ref 36.0–46.0)
Hemoglobin: 13.6 g/dL (ref 12.0–15.0)
LYMPHS PCT: 43 %
Lymphs Abs: 2.5 10*3/uL (ref 0.7–4.0)
MCH: 31.6 pg (ref 26.0–34.0)
MCHC: 33.6 g/dL (ref 30.0–36.0)
MCV: 94.2 fL (ref 78.0–100.0)
Monocytes Absolute: 0.3 10*3/uL (ref 0.1–1.0)
Monocytes Relative: 5 %
NEUTROS ABS: 2.7 10*3/uL (ref 1.7–7.7)
NEUTROS PCT: 46 %
Platelets: 284 10*3/uL (ref 150–400)
RBC: 4.3 MIL/uL (ref 3.87–5.11)
RDW: 13.9 % (ref 11.5–15.5)
WBC: 5.9 10*3/uL (ref 4.0–10.5)

## 2017-07-12 LAB — MULTIPLE MYELOMA PANEL, SERUM
Albumin SerPl Elph-Mcnc: 3.7 g/dL (ref 2.9–4.4)
Albumin/Glob SerPl: 1.1 (ref 0.7–1.7)
Alpha 1: 0.2 g/dL (ref 0.0–0.4)
Alpha2 Glob SerPl Elph-Mcnc: 0.6 g/dL (ref 0.4–1.0)
B-Globulin SerPl Elph-Mcnc: 0.8 g/dL (ref 0.7–1.3)
GAMMA GLOB SERPL ELPH-MCNC: 1.9 g/dL — AB (ref 0.4–1.8)
GLOBULIN, TOTAL: 3.5 g/dL (ref 2.2–3.9)
IGA: 26 mg/dL — AB (ref 87–352)
IgG (Immunoglobin G), Serum: 2197 mg/dL — ABNORMAL HIGH (ref 700–1600)
IgM (Immunoglobulin M), Srm: 34 mg/dL (ref 26–217)
M Protein SerPl Elph-Mcnc: 1.8 g/dL — ABNORMAL HIGH
TOTAL PROTEIN ELP: 7.2 g/dL (ref 6.0–8.5)

## 2017-07-12 LAB — BETA 2 MICROGLOBULIN, SERUM: Beta-2 Microglobulin: 2.3 mg/L (ref 0.6–2.4)

## 2017-07-12 LAB — KAPPA/LAMBDA LIGHT CHAINS
KAPPA FREE LGHT CHN: 335.8 mg/L — AB (ref 3.3–19.4)
Kappa, lambda light chain ratio: 54.16 — ABNORMAL HIGH (ref 0.26–1.65)
LAMDA FREE LIGHT CHAINS: 6.2 mg/L (ref 5.7–26.3)

## 2017-07-12 LAB — IGG, IGA, IGM
IGA: 25 mg/dL — AB (ref 87–352)
IGM (IMMUNOGLOBULIN M), SRM: 29 mg/dL (ref 26–217)
IgG (Immunoglobin G), Serum: 2136 mg/dL — ABNORMAL HIGH (ref 700–1600)

## 2017-07-18 ENCOUNTER — Encounter (HOSPITAL_COMMUNITY): Payer: Self-pay | Admitting: Hematology

## 2017-07-18 ENCOUNTER — Inpatient Hospital Stay (HOSPITAL_COMMUNITY): Payer: Medicare HMO | Attending: Hematology | Admitting: Hematology

## 2017-07-18 VITALS — BP 96/56 | HR 61 | Temp 98.0°F | Resp 14 | Wt 88.8 lb

## 2017-07-18 DIAGNOSIS — J449 Chronic obstructive pulmonary disease, unspecified: Secondary | ICD-10-CM

## 2017-07-18 DIAGNOSIS — M810 Age-related osteoporosis without current pathological fracture: Secondary | ICD-10-CM

## 2017-07-18 DIAGNOSIS — Z87891 Personal history of nicotine dependence: Secondary | ICD-10-CM

## 2017-07-18 DIAGNOSIS — D472 Monoclonal gammopathy: Secondary | ICD-10-CM | POA: Diagnosis not present

## 2017-07-18 MED ORDER — ALENDRONATE SODIUM 70 MG PO TABS: 70.0000 mg | ORAL_TABLET | ORAL | 1 refills | Status: DC

## 2017-07-18 NOTE — Patient Instructions (Signed)
[facility] at [facility] Discharge Instructions  You saw Dr. Katragadda today.   Thank you for choosing [facility] at [facility] to provide your oncology and hematology care.  To afford each patient quality time with our provider, please arrive at least 15 minutes before your scheduled appointment time.   If you have a lab appointment with the Cancer Center please come in thru the  Main Entrance and check in at the main information desk  You need to re-schedule your appointment should you arrive 10 or more minutes late.  We strive to give you quality time with our providers, and arriving late affects you and other patients whose appointments are after yours.  Also, if you no show three or more times for appointments you may be dismissed from the clinic at the providers discretion.     Again, thank you for choosing [facility].  Our hope is that these requests will decrease the amount of time that you wait before being seen by our physicians.       _____________________________________________________________  Should you have questions after your visit to [facility], please contact our office at [phone] between the hours of 8:30 a.m. and 4:30 p.m.  Voicemails left after 4:30 p.m. will not be returned until the following business day.  For prescription refill requests, have your pharmacy contact our office.       Resources For Cancer Patients and their Caregivers ? American Cancer Society: Can assist with transportation, wigs, general needs, runs Look Good Feel Better.        1-888-227-6333 ? Cancer Care: Provides financial assistance, online support groups, medication/co-pay assistance.  1-800-813-HOPE (4673) ? [facility] Assists [place] cancer patients and their families through emotional , educational and financial support.  336-427-4357 ? [place] DSS Where to apply for  food stamps, Medicaid and utility assistance. 336-342-1394 ? RC[facility]: Transportation to medical appointments. 336-347-2287 ? Social Security Administration: May apply for disability if have a Stage IV cancer. 336-342-7796 1-800-772-1213 ? [place] Aging, Disability and Transit Services: Assists with nutrition, care and transit needs. 336-349-2343  Cancer Center Support Programs:   > Cancer Support Group  2nd Tuesday of the month 1pm-2pm, Journey Room   > Creative Journey  3rd Tuesday of the month 1130am-1pm, Journey Room     

## 2017-07-18 NOTE — Progress Notes (Signed)
[name], [place] [address]   CLINIC:  Medical Oncology/Hematology  PCP:  [facility], [place] [address] [phone]   REASON FOR VISIT:  Follow-up for MGUS   INTERVAL HISTORY:  Misty Richardson 62 y.o. female returns for routine follow-up for MGUS. Patient denies any new bone pains. She denies any fevers, night sweats, or weight loss in the past 6 months. She denies any nausea, vomiting, or diarrhea. Patient continues to take her Fosamax without any problems. Overall, she tells me she has been feeling pretty well. Energy levels 75%; appetite 100%.    REVIEW OF SYSTEMS:  Review of Systems  Constitutional: Positive for fatigue.  Respiratory: Positive for shortness of breath (with excertion only).   Neurological: Positive for numbness (Left ankle).  Hematological: Bruises/bleeds easily.  Psychiatric/Behavioral: Positive for sleep disturbance.  All other systems reviewed and are negative.    PAST MEDICAL/SURGICAL HISTORY:  Past Medical History:  Diagnosis Date  . COPD (chronic obstructive pulmonary disease) ([place])   . COPD (chronic obstructive pulmonary disease) ([place]) 09/05/2012  . MGUS (monoclonal gammopathy of unknown significance) 09/05/2012  . Osteoporosis 03/14/2014   Past Surgical History:  Procedure Laterality Date  . ABDOMINAL HYSTERECTOMY    . APPENDECTOMY    . BONE BIOPSY Left 03/25/14  . BONE MARROW ASPIRATION Left 03/25/14  . BONE MARROW BIOPSY Left March 2016  . CESAREAN SECTION       SOCIAL HISTORY:  Social History   Socioeconomic History  . Marital status: Divorced    Spouse name: Not on file  . Number of children: Not on file  . Years of education: Not on file  . Highest education level: Not on file  Occupational History  . Not on file  Social Needs  . Financial resource strain: Not on file  . Food insecurity:    Worry: Not on file    Inability: Not on file  . Transportation  needs:    Medical: Not on file    Non-medical: Not on file  Tobacco Use  . Smoking status: Former Smoker    Packs/day: 1.00    Types: Cigarettes    Last attempt to quit: 07/11/2017    Years since quitting: 0.0  . Smokeless tobacco: Former Systems developer    Quit date: 07/31/2012  Substance and Sexual Activity  . Alcohol use: Yes    Comment: occasionally  . Drug use: No  . Sexual activity: Yes    Birth control/protection: None  Lifestyle  . Physical activity:    Days per week: Not on file    Minutes per session: Not on file  . Stress: Not on file  Relationships  . Social connections:    Talks on phone: Not on file    Gets together: Not on file    Attends religious service: Not on file    Active member of club or organization: Not on file    Attends meetings of clubs or organizations: Not on file    Relationship status: Not on file  . Intimate partner violence:    Fear of current or ex partner: Not on file    Emotionally abused: Not on file    Physically abused: Not on file    Forced sexual activity: Not on file  Other Topics Concern  . Not on file  Social History Narrative  . Not on file    FAMILY HISTORY:  Family History  Problem Relation Age of Onset  .  Heart failure Father     CURRENT MEDICATIONS:  Outpatient Encounter Medications as of 07/18/2017  Medication Sig  . albuterol (ACCUNEB) 1.25 MG/3ML nebulizer solution Take 1 ampule by nebulization every 6 (six) hours as needed. Reported on 07/30/2015  . albuterol (PROVENTIL HFA;VENTOLIN HFA) 108 (90 BASE) MCG/ACT inhaler Inhale 2 puffs into the lungs every 6 (six) hours as needed for wheezing or shortness of breath. Reported on 02/06/2015  . alendronate (FOSAMAX) 70 MG tablet Take 1 tablet (70 mg total) by mouth once a week. Reported on 02/06/2015  . ALPRAZolam (XANAX) 1 MG tablet   . budesonide-formoterol (SYMBICORT) 160-4.5 MCG/ACT inhaler Inhale 2 puffs into the lungs 2 (two) times daily.  . Calcium Carbonate-Vitamin D  (CALCIUM 600+D) 600-200 MG-UNIT TABS Take 1 tablet by mouth 2 (two) times daily.  Marland Kitchen gabapentin (NEURONTIN) 300 MG capsule   . HYDROcodone-acetaminophen (NORCO/VICODIN) 5-325 MG tablet Take one tab po q 4-6 hrs prn pain  . meloxicam (MOBIC) 15 MG tablet   . [DISCONTINUED] alendronate (FOSAMAX) 70 MG tablet Take 1 tablet (70 mg total) by mouth once a week. Reported on 02/06/2015   No facility-administered encounter medications on file as of 07/18/2017.     ALLERGIES:  Allergies  Allergen Reactions  . Avelox [Moxifloxacin Hcl In Nacl] Rash     PHYSICAL EXAM:  ECOG Performance status: 1  Vitals:   07/18/17 1502  BP: (!) 96/56  Pulse: 61  Resp: 14  Temp: 98 F (36.7 C)  SpO2: 100%   Filed Weights   07/18/17 1502  Weight: 88 lb 12.8 oz (40.3 kg)    Physical Exam   LABORATORY DATA:  I have reviewed the labs as listed.  CBC    Component Value Date/Time   WBC 5.9 07/11/2017 1155   RBC 4.30 07/11/2017 1155   HGB 13.6 07/11/2017 1155   HCT 40.5 07/11/2017 1155   PLT 284 07/11/2017 1155   MCV 94.2 07/11/2017 1155   MCH 31.6 07/11/2017 1155   MCHC 33.6 07/11/2017 1155   RDW 13.9 07/11/2017 1155   LYMPHSABS 2.5 07/11/2017 1155   MONOABS 0.3 07/11/2017 1155   EOSABS 0.3 07/11/2017 1155   BASOSABS 0.1 07/11/2017 1155   CMP Latest Ref Rng & Units 07/11/2017 04/07/2017 12/15/2016  Glucose 65 - 99 mg/dL 98 133(H) 98  BUN 6 - 20 mg/dL 21(H) 20 20  Creatinine 0.44 - 1.00 mg/dL 0.70 0.85 0.78  Sodium 135 - 145 mmol/L 140 134(L) 136  Potassium 3.5 - 5.1 mmol/L 3.5 3.1(L) 3.7  Chloride 101 - 111 mmol/L 108 103 107  CO2 22 - 32 mmol/L 26 20(L) 22  Calcium 8.9 - 10.3 mg/dL 8.9 9.0 9.5  Total Protein 6.5 - 8.1 g/dL 7.6 8.6(H) 8.3(H)  Total Bilirubin 0.3 - 1.2 mg/dL 0.5 0.6 0.5  Alkaline Phos 38 - 126 U/L 55 74 65  AST 15 - 41 U/L '23 25 23  '$ ALT 14 - 54 U/L '18 24 15       '$ DIAGNOSTIC IMAGING:  I have independently reviewed the films of skeletal survey dated 07/11/2017 and  discussed with the patient.    ASSESSMENT & PLAN:   MGUS (monoclonal gammopathy of unknown significance) 1.  IgG kappa MGUS: Last bone marrow biopsy was done in March 2016 showing 9% plasma cells with kappa restriction.  Last skeletal survey was in May 2018 which did not show any lytic lesions, but showed osteoporosis. -I discussed the results of the recent blood work from 07/11/2017 which showed  M spike of 1.8 g/dL.  Free light chain ratio is down to 54.  Kappa light chains have also decreased to 335.  Her creatinine and calcium are within normal limits.  She does not have anemia. -I have reviewed skeletal survey from 07/11/2017 which shows stable T7 compression fracture with no new lytic lesions. - As she does not have any "CRAB" features, we will continue to follow her.  She will come back in 4 months with repeat blood work.  2.  Osteoporosis: She is continuing Fosamax weekly.  She has history of compression fractures of lumbar vertebra.      Orders placed this encounter:  Orders Placed This Encounter  Procedures  . DG Bone Density  . Protein electrophoresis, serum  . Lactate dehydrogenase, isoenzymes  . Kappa/lambda light chains  . CBC with Differential/Platelet  . Comprehensive metabolic panel      Derek Jack, MD [place] [phone]

## 2017-07-20 NOTE — Assessment & Plan Note (Signed)
1.  IgG kappa MGUS: Last bone marrow biopsy was done in March 2016 showing 9% plasma cells with kappa restriction.  Last skeletal survey was in May 2018 which did not show any lytic lesions, but showed osteoporosis. -I discussed the results of the recent blood work from 07/11/2017 which showed M spike of 1.8 g/dL.  Free light chain ratio is down to 54.  Kappa light chains have also decreased to 335.  Her creatinine and calcium are within normal limits.  She does not have anemia. -I have reviewed skeletal survey from 07/11/2017 which shows stable T7 compression fracture with no new lytic lesions. - As she does not have any "CRAB" features, we will continue to follow her.  She will come back in 4 months with repeat blood work.  2.  Osteoporosis: She is continuing Fosamax weekly.  She has history of compression fractures of lumbar vertebra.

## 2017-09-26 DIAGNOSIS — M1991 Primary osteoarthritis, unspecified site: Secondary | ICD-10-CM | POA: Diagnosis not present

## 2017-09-26 DIAGNOSIS — J449 Chronic obstructive pulmonary disease, unspecified: Secondary | ICD-10-CM | POA: Diagnosis not present

## 2017-09-26 DIAGNOSIS — Z681 Body mass index (BMI) 19 or less, adult: Secondary | ICD-10-CM | POA: Diagnosis not present

## 2017-09-26 DIAGNOSIS — R69 Illness, unspecified: Secondary | ICD-10-CM | POA: Diagnosis not present

## 2017-09-26 DIAGNOSIS — Z1389 Encounter for screening for other disorder: Secondary | ICD-10-CM | POA: Diagnosis not present

## 2017-11-09 ENCOUNTER — Other Ambulatory Visit (HOSPITAL_COMMUNITY): Payer: Self-pay | Admitting: Nurse Practitioner

## 2017-11-09 DIAGNOSIS — Z1231 Encounter for screening mammogram for malignant neoplasm of breast: Secondary | ICD-10-CM

## 2017-11-14 ENCOUNTER — Inpatient Hospital Stay (HOSPITAL_COMMUNITY): Payer: Medicare HMO | Attending: Hematology

## 2017-11-14 DIAGNOSIS — D472 Monoclonal gammopathy: Secondary | ICD-10-CM | POA: Diagnosis not present

## 2017-11-14 LAB — CBC WITH DIFFERENTIAL/PLATELET
Abs Immature Granulocytes: 0.01 10*3/uL (ref 0.00–0.07)
Basophils Absolute: 0.1 10*3/uL (ref 0.0–0.1)
Basophils Relative: 2 %
EOS ABS: 0.6 10*3/uL — AB (ref 0.0–0.5)
EOS PCT: 9 %
HCT: 44.6 % (ref 36.0–46.0)
Hemoglobin: 14.5 g/dL (ref 12.0–15.0)
Immature Granulocytes: 0 %
Lymphocytes Relative: 34 %
Lymphs Abs: 2.5 10*3/uL (ref 0.7–4.0)
MCH: 30.3 pg (ref 26.0–34.0)
MCHC: 32.5 g/dL (ref 30.0–36.0)
MCV: 93.3 fL (ref 80.0–100.0)
MONO ABS: 0.6 10*3/uL (ref 0.1–1.0)
MONOS PCT: 8 %
Neutro Abs: 3.4 10*3/uL (ref 1.7–7.7)
Neutrophils Relative %: 47 %
PLATELETS: 352 10*3/uL (ref 150–400)
RBC: 4.78 MIL/uL (ref 3.87–5.11)
RDW: 12.9 % (ref 11.5–15.5)
WBC: 7.2 10*3/uL (ref 4.0–10.5)
nRBC: 0 % (ref 0.0–0.2)

## 2017-11-14 LAB — COMPREHENSIVE METABOLIC PANEL
ALT: 20 U/L (ref 0–44)
AST: 25 U/L (ref 15–41)
Albumin: 4.4 g/dL (ref 3.5–5.0)
Alkaline Phosphatase: 56 U/L (ref 38–126)
Anion gap: 8 (ref 5–15)
BUN: 14 mg/dL (ref 8–23)
CHLORIDE: 107 mmol/L (ref 98–111)
CO2: 21 mmol/L — ABNORMAL LOW (ref 22–32)
Calcium: 9.3 mg/dL (ref 8.9–10.3)
Creatinine, Ser: 0.73 mg/dL (ref 0.44–1.00)
Glucose, Bld: 107 mg/dL — ABNORMAL HIGH (ref 70–99)
POTASSIUM: 3.9 mmol/L (ref 3.5–5.1)
Sodium: 136 mmol/L (ref 135–145)
TOTAL PROTEIN: 8.8 g/dL — AB (ref 6.5–8.1)
Total Bilirubin: 0.7 mg/dL (ref 0.3–1.2)

## 2017-11-15 LAB — KAPPA/LAMBDA LIGHT CHAINS
Kappa free light chain: 351.1 mg/L — ABNORMAL HIGH (ref 3.3–19.4)
Kappa, lambda light chain ratio: 57.56 — ABNORMAL HIGH (ref 0.26–1.65)
Lambda free light chains: 6.1 mg/L (ref 5.7–26.3)

## 2017-11-15 LAB — PROTEIN ELECTROPHORESIS, SERUM
A/G Ratio: 1.1 (ref 0.7–1.7)
ALPHA-2-GLOBULIN: 0.8 g/dL (ref 0.4–1.0)
Albumin ELP: 4.4 g/dL (ref 2.9–4.4)
Alpha-1-Globulin: 0.2 g/dL (ref 0.0–0.4)
Beta Globulin: 0.9 g/dL (ref 0.7–1.3)
GLOBULIN, TOTAL: 3.9 g/dL (ref 2.2–3.9)
Gamma Globulin: 2 g/dL — ABNORMAL HIGH (ref 0.4–1.8)
M-Spike, %: 1.9 g/dL — ABNORMAL HIGH
TOTAL PROTEIN ELP: 8.3 g/dL (ref 6.0–8.5)

## 2017-11-17 LAB — LACTATE DEHYDROGENASE, ISOENZYMES
LDH 1: 30 % (ref 17–32)
LDH 2: 34 % (ref 25–40)
LDH 3: 22 % (ref 17–27)
LDH 4: 6 % (ref 5–13)
LDH 5: 8 % (ref 4–20)
LDH Isoenzymes, Total: 120 IU/L (ref 119–226)

## 2017-11-18 ENCOUNTER — Other Ambulatory Visit (HOSPITAL_COMMUNITY): Payer: Medicare HMO

## 2017-11-19 DIAGNOSIS — D472 Monoclonal gammopathy: Secondary | ICD-10-CM | POA: Diagnosis not present

## 2017-11-19 DIAGNOSIS — Z720 Tobacco use: Secondary | ICD-10-CM | POA: Diagnosis not present

## 2017-11-19 DIAGNOSIS — J439 Emphysema, unspecified: Secondary | ICD-10-CM | POA: Diagnosis not present

## 2017-11-19 DIAGNOSIS — R69 Illness, unspecified: Secondary | ICD-10-CM | POA: Diagnosis not present

## 2017-11-19 DIAGNOSIS — R0602 Shortness of breath: Secondary | ICD-10-CM | POA: Diagnosis not present

## 2017-11-21 ENCOUNTER — Encounter (HOSPITAL_COMMUNITY): Payer: Self-pay | Admitting: Hematology

## 2017-11-21 ENCOUNTER — Inpatient Hospital Stay (HOSPITAL_COMMUNITY): Payer: Medicare HMO | Attending: Hematology | Admitting: Hematology

## 2017-11-21 ENCOUNTER — Ambulatory Visit (HOSPITAL_COMMUNITY): Payer: Medicare HMO | Admitting: Hematology

## 2017-11-21 VITALS — BP 117/85 | HR 82 | Temp 98.2°F | Resp 20 | Wt 90.6 lb

## 2017-11-21 DIAGNOSIS — D472 Monoclonal gammopathy: Secondary | ICD-10-CM | POA: Diagnosis not present

## 2017-11-21 DIAGNOSIS — M810 Age-related osteoporosis without current pathological fracture: Secondary | ICD-10-CM

## 2017-11-21 DIAGNOSIS — Z79899 Other long term (current) drug therapy: Secondary | ICD-10-CM | POA: Insufficient documentation

## 2017-11-21 DIAGNOSIS — Z87891 Personal history of nicotine dependence: Secondary | ICD-10-CM | POA: Insufficient documentation

## 2017-11-21 DIAGNOSIS — Z23 Encounter for immunization: Secondary | ICD-10-CM | POA: Insufficient documentation

## 2017-11-21 MED ORDER — INFLUENZA VAC SPLIT QUAD 0.5 ML IM SUSY
0.5000 mL | PREFILLED_SYRINGE | Freq: Once | INTRAMUSCULAR | Status: AC
  Administered 2017-11-21: 0.5 mL via INTRAMUSCULAR
  Filled 2017-11-21: qty 0.5

## 2017-11-21 NOTE — Patient Instructions (Addendum)
[place] at [facility]  Discharge Instructions: Follow up in 4 months with labs   You were seen by Dr. Delton Coombes today.  _______________________________________________________________  Thank you for choosing [place] at [facility] to provide your oncology and hematology care.  To afford each patient quality time with our providers, please arrive at least 15 minutes before your scheduled appointment.  You need to re-schedule your appointment if you arrive 10 or more minutes late.  We strive to give you quality time with our providers, and arriving late affects you and other patients whose appointments are after yours.  Also, if you no show three or more times for appointments you may be dismissed from the clinic.  Again, thank you for choosing [place] at [place] hope is that these requests will allow you access to exceptional care and in a timely manner. _______________________________________________________________  If you have questions after your visit, please contact our office at [phone] between the hours of 8:30 a.m. and 5:00 p.m. Voicemails left after 4:30 p.m. will not be returned until the following business day. _______________________________________________________________  For prescription refill requests, have your pharmacy contact our office. _______________________________________________________________  Recommendations made by the consultant and any test results will be sent to your referring physician. _______________________________________________________________

## 2017-11-21 NOTE — Progress Notes (Signed)
[place], [place] [address]   CLINIC:  Medical Oncology/Hematology  PCP:  [facility], [place] [address] [phone]   REASON FOR VISIT: Follow-up for MGUS  CURRENT THERAPY: Observation   INTERVAL HISTORY:  Misty Richardson 62 y.o. female returns for routine follow-up for MGUS. She is has been doing good since her last visit. She recently was seen in the ER in [place] for COPD exacerbation. She is on prednisone daily for this week. She reports she is normally wheezy however it is worse this week. She denies any nausea, vomiting, or diarrhea. Denies any new pains. Denies any bleeding or easy bruising. Patient reports her appetite at 100% and she is maintaining her weight at this time. Her energy level is 50%. She is currently trying to stop smoking today is day 4 without cigarettes. She has patches at home but is trying to stop without using them. She will let us know if she needs any further assistant in quitting.     REVIEW OF SYSTEMS:  Review of Systems  Constitutional: Positive for fatigue.  All other systems reviewed and are negative.    PAST MEDICAL/SURGICAL HISTORY:  Past Medical History:  Diagnosis Date  . COPD (chronic obstructive pulmonary disease) ([place])   . COPD (chronic obstructive pulmonary disease) ([place]) 09/05/2012  . MGUS (monoclonal gammopathy of unknown significance) 09/05/2012  . Osteoporosis 03/14/2014   Past Surgical History:  Procedure Laterality Date  . ABDOMINAL HYSTERECTOMY    . APPENDECTOMY    . BONE BIOPSY Left 03/25/14  . BONE MARROW ASPIRATION Left 03/25/14  . BONE MARROW BIOPSY Left March 2016  . CESAREAN SECTION       SOCIAL HISTORY:  Social History   Socioeconomic History  . Marital status: Divorced    Spouse name: Not on file  . Number of children: Not on file  . Years of education: Not on file  . Highest education level: Not on file  Occupational History  . Not on file    Social Needs  . Financial resource strain: Not on file  . Food insecurity:    Worry: Not on file    Inability: Not on file  . Transportation needs:    Medical: Not on file    Non-medical: Not on file  Tobacco Use  . Smoking status: Former Smoker    Packs/day: 1.00    Types: Cigarettes    Last attempt to quit: 07/11/2017    Years since quitting: 0.3  . Smokeless tobacco: Former Systems developer    Quit date: 07/31/2012  Substance and Sexual Activity  . Alcohol use: Yes    Comment: occasionally  . Drug use: No  . Sexual activity: Yes    Birth control/protection: None  Lifestyle  . Physical activity:    Days per week: Not on file    Minutes per session: Not on file  . Stress: Not on file  Relationships  . Social connections:    Talks on phone: Not on file    Gets together: Not on file    Attends religious service: Not on file    Active member of club or organization: Not on file    Attends meetings of clubs or organizations: Not on file    Relationship status: Not on file  . Intimate partner violence:    Fear of current or ex partner: Not on file    Emotionally abused: Not on file    Physically abused:  Not on file    Forced sexual activity: Not on file  Other Topics Concern  . Not on file  Social History Narrative  . Not on file    FAMILY HISTORY:  Family History  Problem Relation Age of Onset  . Heart failure Father     CURRENT MEDICATIONS:  Outpatient Encounter Medications as of 11/21/2017  Medication Sig  . albuterol (ACCUNEB) 1.25 MG/3ML nebulizer solution Take 1 ampule by nebulization every 6 (six) hours as needed. Reported on 07/30/2015  . albuterol (PROVENTIL HFA;VENTOLIN HFA) 108 (90 BASE) MCG/ACT inhaler Inhale 2 puffs into the lungs every 6 (six) hours as needed for wheezing or shortness of breath. Reported on 02/06/2015  . alendronate (FOSAMAX) 70 MG tablet Take 1 tablet (70 mg total) by mouth once a week. Reported on 02/06/2015  . ALPRAZolam (XANAX) 1 MG tablet   .  budesonide-formoterol (SYMBICORT) 160-4.5 MCG/ACT inhaler Inhale 2 puffs into the lungs 2 (two) times daily.  . Calcium Carbonate-Vitamin D (CALCIUM 600+D) 600-200 MG-UNIT TABS Take 1 tablet by mouth 2 (two) times daily.  Marland Kitchen doxycycline (VIBRA-TABS) 100 MG tablet   . gabapentin (NEURONTIN) 300 MG capsule   . HYDROcodone-acetaminophen (NORCO/VICODIN) 5-325 MG tablet Take one tab po q 4-6 hrs prn pain  . ibuprofen (ADVIL,MOTRIN) 600 MG tablet   . meloxicam (MOBIC) 15 MG tablet   . predniSONE (DELTASONE) 20 MG tablet   . [EXPIRED] Influenza vac split quadrivalent PF (FLUARIX) injection 0.5 mL    No facility-administered encounter medications on file as of 11/21/2017.     ALLERGIES:  Allergies  Allergen Reactions  . Avelox [Moxifloxacin Hcl In Nacl] Rash     PHYSICAL EXAM:  ECOG Performance status: 1  Vitals:   11/21/17 1134  BP: 117/85  Pulse: 82  Resp: 20  Temp: 98.2 F (36.8 C)  SpO2: 94%   Filed Weights   11/21/17 1134  Weight: 90 lb 9.6 oz (41.1 kg)    Physical Exam  Constitutional: She is oriented to person, place, and time. She appears well-developed and well-nourished.  Cardiovascular: Normal rate, regular rhythm and normal heart sounds.  Pulmonary/Chest: She has wheezes.  Abdominal: Soft.  Musculoskeletal: Normal range of motion.  Neurological: She is alert and oriented to person, place, and time.  Skin: Skin is warm and dry.  Psychiatric: She has a normal mood and affect. Her behavior is normal. Judgment and thought content normal.     LABORATORY DATA:  I have reviewed the labs as listed.  CBC    Component Value Date/Time   WBC 7.2 11/14/2017 1253   RBC 4.78 11/14/2017 1253   HGB 14.5 11/14/2017 1253   HCT 44.6 11/14/2017 1253   PLT 352 11/14/2017 1253   MCV 93.3 11/14/2017 1253   MCH 30.3 11/14/2017 1253   MCHC 32.5 11/14/2017 1253   RDW 12.9 11/14/2017 1253   LYMPHSABS 2.5 11/14/2017 1253   MONOABS 0.6 11/14/2017 1253   EOSABS 0.6 (H) 11/14/2017  1253   BASOSABS 0.1 11/14/2017 1253   CMP Latest Ref Rng & Units 11/14/2017 07/11/2017 04/07/2017  Glucose 70 - 99 mg/dL 107(H) 98 133(H)  BUN 8 - 23 mg/dL 14 21(H) 20  Creatinine 0.44 - 1.00 mg/dL 0.73 0.70 0.85  Sodium 135 - 145 mmol/L 136 140 134(L)  Potassium 3.5 - 5.1 mmol/L 3.9 3.5 3.1(L)  Chloride 98 - 111 mmol/L 107 108 103  CO2 22 - 32 mmol/L 21(L) 26 20(L)  Calcium 8.9 - 10.3 mg/dL 9.3 8.9 9.0  Total Protein 6.5 - 8.1 g/dL 8.8(H) 7.6 8.6(H)  Total Bilirubin 0.3 - 1.2 mg/dL 0.7 0.5 0.6  Alkaline Phos 38 - 126 U/L 56 55 74  AST 15 - 41 U/L '25 23 25  '$ ALT 0 - 44 U/L '20 18 24         '$ ASSESSMENT & PLAN:   MGUS (monoclonal gammopathy of unknown significance) 1.  IgG kappa MGUS:  Last bone marrow biopsy was done in March 2016 showing 9% plasma cells with kappa restriction.  - Skeletal survey from 07/11/2017 shows stable T7 compression deformity with small new lytic lesions. - We have reviewed blood work from 11/14/2017 which shows M spike of 1.9.  Creatinine 0.73.  Calcium 9.3.  Free light chain ratio is 57.56. -She does not have any "CRAB" features.  We will continue to monitor her every 4 to 6 months.  2.  Osteoporosis: -She is continuing Fosamax 70 mg weekly.  She has bone density scheduled later this week. -She will also continue calcium and vitamin D supplements.      Orders placed this encounter:  Orders Placed This Encounter  Procedures  . Lactate dehydrogenase  . Protein electrophoresis, serum  . Kappa/lambda light chains  . CBC with Differential/Platelet  . Comprehensive metabolic panel  . IgG, IgA, IgM      Derek Jack, MD [place] [phone]

## 2017-11-21 NOTE — Progress Notes (Signed)
Patient tolerated injection with no complaints voiced.  Site clean and dry with no bruising or swelling noted.  Band aid applied.  Left ambulatory with no s/s of distress noted.

## 2017-11-21 NOTE — Assessment & Plan Note (Signed)
1.  IgG kappa MGUS:  Last bone marrow biopsy was done in March 2016 showing 9% plasma cells with kappa restriction.  - Skeletal survey from 07/11/2017 shows stable T7 compression deformity with small new lytic lesions. - We have reviewed blood work from 11/14/2017 which shows M spike of 1.9.  Creatinine 0.73.  Calcium 9.3.  Free light chain ratio is 57.56. -She does not have any "CRAB" features.  We will continue to monitor her every 4 to 6 months.  2.  Osteoporosis: -She is continuing Fosamax 70 mg weekly.  She has bone density scheduled later this week. -She will also continue calcium and vitamin D supplements.

## 2017-11-23 ENCOUNTER — Ambulatory Visit (HOSPITAL_COMMUNITY)
Admission: RE | Admit: 2017-11-23 | Discharge: 2017-11-23 | Disposition: A | Discharge status: Home / Self Care | Payer: Medicare HMO | Source: Ambulatory Visit | Attending: Nurse Practitioner | Admitting: Nurse Practitioner

## 2017-11-23 DIAGNOSIS — Z1231 Encounter for screening mammogram for malignant neoplasm of breast: Secondary | ICD-10-CM | POA: Insufficient documentation

## 2017-11-23 DIAGNOSIS — M818 Other osteoporosis without current pathological fracture: Secondary | ICD-10-CM | POA: Diagnosis not present

## 2017-11-23 DIAGNOSIS — M810 Age-related osteoporosis without current pathological fracture: Secondary | ICD-10-CM | POA: Diagnosis not present

## 2017-12-07 DIAGNOSIS — J449 Chronic obstructive pulmonary disease, unspecified: Secondary | ICD-10-CM | POA: Diagnosis not present

## 2017-12-07 DIAGNOSIS — Z681 Body mass index (BMI) 19 or less, adult: Secondary | ICD-10-CM | POA: Diagnosis not present

## 2017-12-07 DIAGNOSIS — Z0001 Encounter for general adult medical examination with abnormal findings: Secondary | ICD-10-CM | POA: Diagnosis not present

## 2017-12-07 DIAGNOSIS — Z1389 Encounter for screening for other disorder: Secondary | ICD-10-CM | POA: Diagnosis not present

## 2017-12-07 DIAGNOSIS — G4709 Other insomnia: Secondary | ICD-10-CM | POA: Diagnosis not present

## 2018-02-27 ENCOUNTER — Other Ambulatory Visit (HOSPITAL_COMMUNITY): Payer: Self-pay | Admitting: Nurse Practitioner

## 2018-02-27 DIAGNOSIS — Z681 Body mass index (BMI) 19 or less, adult: Secondary | ICD-10-CM | POA: Diagnosis not present

## 2018-02-27 DIAGNOSIS — M1991 Primary osteoarthritis, unspecified site: Secondary | ICD-10-CM | POA: Diagnosis not present

## 2018-02-27 DIAGNOSIS — M810 Age-related osteoporosis without current pathological fracture: Secondary | ICD-10-CM

## 2018-02-27 DIAGNOSIS — J449 Chronic obstructive pulmonary disease, unspecified: Secondary | ICD-10-CM | POA: Diagnosis not present

## 2018-02-27 DIAGNOSIS — J9801 Acute bronchospasm: Secondary | ICD-10-CM | POA: Diagnosis not present

## 2018-02-27 DIAGNOSIS — J329 Chronic sinusitis, unspecified: Secondary | ICD-10-CM | POA: Diagnosis not present

## 2018-03-15 ENCOUNTER — Other Ambulatory Visit (HOSPITAL_COMMUNITY): Payer: Medicare HMO

## 2018-03-22 ENCOUNTER — Inpatient Hospital Stay (HOSPITAL_COMMUNITY): Payer: Medicare HMO | Attending: Hematology

## 2018-03-22 ENCOUNTER — Ambulatory Visit (HOSPITAL_COMMUNITY): Payer: Medicare HMO | Admitting: Hematology

## 2018-03-22 ENCOUNTER — Other Ambulatory Visit: Payer: Self-pay

## 2018-03-22 DIAGNOSIS — M818 Other osteoporosis without current pathological fracture: Secondary | ICD-10-CM | POA: Diagnosis not present

## 2018-03-22 DIAGNOSIS — J449 Chronic obstructive pulmonary disease, unspecified: Secondary | ICD-10-CM | POA: Insufficient documentation

## 2018-03-22 DIAGNOSIS — Z87891 Personal history of nicotine dependence: Secondary | ICD-10-CM | POA: Diagnosis not present

## 2018-03-22 DIAGNOSIS — D472 Monoclonal gammopathy: Secondary | ICD-10-CM | POA: Diagnosis present

## 2018-03-22 LAB — COMPREHENSIVE METABOLIC PANEL
ALT: 18 U/L (ref 0–44)
AST: 20 U/L (ref 15–41)
Albumin: 4.4 g/dL (ref 3.5–5.0)
Alkaline Phosphatase: 68 U/L (ref 38–126)
Anion gap: 8 (ref 5–15)
BUN: 18 mg/dL (ref 8–23)
CO2: 23 mmol/L (ref 22–32)
Calcium: 9.5 mg/dL (ref 8.9–10.3)
Chloride: 103 mmol/L (ref 98–111)
Creatinine, Ser: 0.73 mg/dL (ref 0.44–1.00)
GFR calc Af Amer: >60 mL/min (ref >60)
GFR calc non Af Amer: >60 mL/min (ref >60)
Glucose, Bld: 77 mg/dL (ref 70–99)
Potassium: 3.9 mmol/L (ref 3.5–5.1)
Sodium: 134 mmol/L — ABNORMAL LOW (ref 135–145)
Total Bilirubin: 0.3 mg/dL (ref 0.3–1.2)
Total Protein: 8.7 g/dL — ABNORMAL HIGH (ref 6.5–8.1)

## 2018-03-22 LAB — CBC WITH DIFFERENTIAL/PLATELET
ABS IMMATURE GRANULOCYTES: 0.01 10*3/uL (ref 0.00–0.07)
BASOS ABS: 0.1 10*3/uL (ref 0.0–0.1)
Basophils Relative: 1 %
Eosinophils Absolute: 0.3 10*3/uL (ref 0.0–0.5)
Eosinophils Relative: 4 %
HEMATOCRIT: 46.3 % — AB (ref 36.0–46.0)
HEMOGLOBIN: 14.9 g/dL (ref 12.0–15.0)
IMMATURE GRANULOCYTES: 0 %
LYMPHS ABS: 3.6 10*3/uL (ref 0.7–4.0)
LYMPHS PCT: 46 %
MCH: 29.6 pg (ref 26.0–34.0)
MCHC: 32.2 g/dL (ref 30.0–36.0)
MCV: 92 fL (ref 80.0–100.0)
Monocytes Absolute: 0.5 10*3/uL (ref 0.1–1.0)
Monocytes Relative: 7 %
NEUTROS PCT: 42 %
NRBC: 0 % (ref 0.0–0.2)
Neutro Abs: 3.3 10*3/uL (ref 1.7–7.7)
Platelets: 325 10*3/uL (ref 150–400)
RBC: 5.03 MIL/uL (ref 3.87–5.11)
RDW: 13.5 % (ref 11.5–15.5)
WBC: 7.8 10*3/uL (ref 4.0–10.5)

## 2018-03-22 LAB — LACTATE DEHYDROGENASE: LDH: 86 U/L — ABNORMAL LOW (ref 98–192)

## 2018-03-23 LAB — KAPPA/LAMBDA LIGHT CHAINS
KAPPA FREE LGHT CHN: 343.6 mg/L — AB (ref 3.3–19.4)
KAPPA, LAMDA LIGHT CHAIN RATIO: 50.53 — AB (ref 0.26–1.65)
LAMDA FREE LIGHT CHAINS: 6.8 mg/L (ref 5.7–26.3)

## 2018-03-23 LAB — IGG, IGA, IGM
IgA: 39 mg/dL — ABNORMAL LOW (ref 87–352)
IgG (Immunoglobin G), Serum: 2717 mg/dL — ABNORMAL HIGH (ref 700–1600)
IgM (Immunoglobulin M), Srm: 31 mg/dL (ref 26–217)

## 2018-03-24 LAB — PROTEIN ELECTROPHORESIS, SERUM
A/G Ratio: 1.1 (ref 0.7–1.7)
ALPHA-1-GLOBULIN: 0.2 g/dL (ref 0.0–0.4)
Albumin ELP: 4.1 g/dL (ref 2.9–4.4)
Alpha-2-Globulin: 0.7 g/dL (ref 0.4–1.0)
Beta Globulin: 0.9 g/dL (ref 0.7–1.3)
GLOBULIN, TOTAL: 3.8 g/dL (ref 2.2–3.9)
Gamma Globulin: 1.9 g/dL — ABNORMAL HIGH (ref 0.4–1.8)
M-SPIKE, %: 1.7 g/dL — AB
TOTAL PROTEIN ELP: 7.9 g/dL (ref 6.0–8.5)

## 2018-03-29 ENCOUNTER — Ambulatory Visit (HOSPITAL_COMMUNITY): Payer: Medicare HMO | Admitting: Hematology

## 2018-03-31 ENCOUNTER — Other Ambulatory Visit: Payer: Self-pay

## 2018-03-31 ENCOUNTER — Inpatient Hospital Stay (HOSPITAL_BASED_OUTPATIENT_CLINIC_OR_DEPARTMENT_OTHER): Payer: Medicare HMO | Admitting: Hematology

## 2018-03-31 ENCOUNTER — Encounter (HOSPITAL_COMMUNITY): Payer: Self-pay | Admitting: Hematology

## 2018-03-31 ENCOUNTER — Ambulatory Visit (HOSPITAL_COMMUNITY)
Admission: RE | Admit: 2018-03-31 | Discharge: 2018-03-31 | Disposition: A | Discharge status: Home / Self Care | Payer: Medicare HMO | Source: Ambulatory Visit | Attending: Hematology | Admitting: Hematology

## 2018-03-31 VITALS — BP 141/75 | HR 66 | Temp 97.4°F | Resp 18 | Wt 93.8 lb

## 2018-03-31 DIAGNOSIS — M818 Other osteoporosis without current pathological fracture: Secondary | ICD-10-CM

## 2018-03-31 DIAGNOSIS — D472 Monoclonal gammopathy: Secondary | ICD-10-CM | POA: Insufficient documentation

## 2018-03-31 DIAGNOSIS — R0781 Pleurodynia: Secondary | ICD-10-CM | POA: Insufficient documentation

## 2018-03-31 DIAGNOSIS — J449 Chronic obstructive pulmonary disease, unspecified: Secondary | ICD-10-CM

## 2018-03-31 DIAGNOSIS — Z87891 Personal history of nicotine dependence: Secondary | ICD-10-CM | POA: Diagnosis not present

## 2018-03-31 NOTE — Progress Notes (Signed)
 Misty Richardson [address] [place], [place] [address]   CLINIC:  Medical Oncology/Hematology  PCP:  Fusco, Lawrence, MD [address] [place] [address] [phone]   REASON FOR VISIT:  Follow-up for  MGUS  CURRENT THERAPY: Observation    INTERVAL HISTORY:  Misty Richardson 62 y.o. female returns for routine follow-up. She is here today by herself. She states that she has noticed some breast tenderness, but is unsure how long she has had the tenderness. Denies any nausea, vomiting, or diarrhea. Denies any new pains. Had not noticed any recent bleeding such as epistaxis, hematuria or hematochezia. Denies recent chest pain on exertion, shortness of breath on minimal exertion, pre-syncopal episodes, or palpitations. Denies any numbness or tingling in hands or feet. Denies any recent fevers, infections, or recent hospitalizations. Patient reports appetite at 100% and energy level at 100%.   REVIEW OF SYSTEMS:  Review of Systems  Musculoskeletal:       Pain in the anterior ribs.  All other systems reviewed and are negative.    PAST MEDICAL/SURGICAL HISTORY:  Past Medical History:  Diagnosis Date  . COPD (chronic obstructive pulmonary disease) (HCC)   . COPD (chronic obstructive pulmonary disease) (HCC) 09/05/2012  . MGUS (monoclonal gammopathy of unknown significance) 09/05/2012  . Osteoporosis 03/14/2014   Past Surgical History:  Procedure Laterality Date  . ABDOMINAL HYSTERECTOMY    . APPENDECTOMY    . BONE BIOPSY Left 03/25/14  . BONE MARROW ASPIRATION Left 03/25/14  . BONE MARROW BIOPSY Left March 2016  . CESAREAN SECTION       SOCIAL HISTORY:  Social History   Socioeconomic History  . Marital status: Divorced    Spouse name: Not on file  . Number of children: Not on file  . Years of education: Not on file  . Highest education level: Not on file  Occupational History  . Not on file  Social Needs  . Financial resource strain: Not on file  .  Food insecurity:    Worry: Not on file    Inability: Not on file  . Transportation needs:    Medical: Not on file    Non-medical: Not on file  Tobacco Use  . Smoking status: Former Smoker    Packs/day: 1.00    Types: Cigarettes    Last attempt to quit: 07/11/2017    Years since quitting: 0.7  . Smokeless tobacco: Former User    Quit date: 07/31/2012  Substance and Sexual Activity  . Alcohol use: Yes    Comment: occasionally  . Drug use: No  . Sexual activity: Yes    Birth control/protection: None  Lifestyle  . Physical activity:    Days per week: Not on file    Minutes per session: Not on file  . Stress: Not on file  Relationships  . Social connections:    Talks on phone: Not on file    Gets together: Not on file    Attends religious service: Not on file    Active member of club or organization: Not on file    Attends meetings of clubs or organizations: Not on file    Relationship status: Not on file  . Intimate partner violence:    Fear of current or ex partner: Not on file    Emotionally abused: Not on file    Physically abused: Not on file    Forced sexual activity: Not on file  Other Topics Concern  . Not on file    Social History Narrative  . Not on file    FAMILY HISTORY:  Family History  Problem Relation Age of Onset  . Heart failure Father     CURRENT MEDICATIONS:  Outpatient Encounter Medications as of 03/31/2018  Medication Sig  . albuterol (ACCUNEB) 1.25 MG/3ML nebulizer solution Take 1 ampule by nebulization every 6 (six) hours as needed. Reported on 07/30/2015  . albuterol (PROVENTIL HFA;VENTOLIN HFA) 108 (90 BASE) MCG/ACT inhaler Inhale 2 puffs into the lungs every 6 (six) hours as needed for wheezing or shortness of breath. Reported on 02/06/2015  . alendronate (FOSAMAX) 70 MG tablet TAKE 1 TABLET (70 MG TOTAL) BY MOUTH ONCE A WEEK  . ALPRAZolam (XANAX) 1 MG tablet   . budesonide-formoterol (SYMBICORT) 160-4.5 MCG/ACT inhaler Inhale 2 puffs into the  lungs 2 (two) times daily.  . Calcium Carbonate-Vitamin D (CALCIUM 600+D) 600-200 MG-UNIT TABS Take 1 tablet by mouth 2 (two) times daily.  . ibuprofen (ADVIL,MOTRIN) 600 MG tablet   . meloxicam (MOBIC) 15 MG tablet   . gabapentin (NEURONTIN) 300 MG capsule   . HYDROcodone-acetaminophen (NORCO/VICODIN) 5-325 MG tablet Take one tab po q 4-6 hrs prn pain (Patient not taking: Reported on 03/31/2018)  . [DISCONTINUED] doxycycline (VIBRA-TABS) 100 MG tablet   . [DISCONTINUED] predniSONE (DELTASONE) 20 MG tablet    No facility-administered encounter medications on file as of 03/31/2018.     ALLERGIES:  Allergies  Allergen Reactions  . Avelox [Moxifloxacin Hcl In Nacl] Rash     PHYSICAL EXAM:  ECOG Performance status: 1  Vitals:   03/31/18 0829  BP: (!) 141/75  Pulse: 66  Resp: 18  Temp: (!) 97.4 F (36.3 C)  SpO2: 96%   Filed Weights   03/31/18 0829  Weight: 93 lb 12.8 oz (42.5 kg)    Physical Exam Constitutional:      Appearance: Normal appearance.  Cardiovascular:     Rate and Rhythm: Normal rate and regular rhythm.     Pulses: Normal pulses.  Pulmonary:     Effort: Pulmonary effort is normal.     Breath sounds: Normal breath sounds.  Abdominal:     General: Bowel sounds are normal. There is no distension.     Palpations: Abdomen is soft.  Musculoskeletal:     Comments: Slight tenderness in the anterior upper ribs on both sides.  Skin:    General: Skin is warm.  Neurological:     General: No focal deficit present.     Mental Status: She is alert and oriented to person, place, and time.  Psychiatric:        Mood and Affect: Mood normal.        Behavior: Behavior normal.      LABORATORY DATA:  I have reviewed the labs as listed.  CBC    Component Value Date/Time   WBC 7.8 03/22/2018 1044   RBC 5.03 03/22/2018 1044   HGB 14.9 03/22/2018 1044   HCT 46.3 (H) 03/22/2018 1044   PLT 325 03/22/2018 1044   MCV 92.0 03/22/2018 1044   MCH 29.6 03/22/2018 1044    MCHC 32.2 03/22/2018 1044   RDW 13.5 03/22/2018 1044   LYMPHSABS 3.6 03/22/2018 1044   MONOABS 0.5 03/22/2018 1044   EOSABS 0.3 03/22/2018 1044   BASOSABS 0.1 03/22/2018 1044   CMP Latest Ref Rng & Units 03/22/2018 11/14/2017 07/11/2017  Glucose 70 - 99 mg/dL 77 107(H) 98  BUN 8 - 23 mg/dL 18 14 21(H)  Creatinine 0.44 - 1.00 mg/dL 0.73   0.73 0.70  Sodium 135 - 145 mmol/L 134(L) 136 140  Potassium 3.5 - 5.1 mmol/L 3.9 3.9 3.5  Chloride 98 - 111 mmol/L 103 107 108  CO2 22 - 32 mmol/L 23 21(L) 26  Calcium 8.9 - 10.3 mg/dL 9.5 9.3 8.9  Total Protein 6.5 - 8.1 g/dL 8.7(H) 8.8(H) 7.6  Total Bilirubin 0.3 - 1.2 mg/dL 0.3 0.7 0.5  Alkaline Phos 38 - 126 U/L 68 56 55  AST 15 - 41 U/L 20 25 23  ALT 0 - 44 U/L 18 20 18       DIAGNOSTIC IMAGING:  I have independently reviewed the scans and discussed with the patient.   I have reviewed Ashley Travis LPN's note and agree with the documentation.  I personally performed a face-to-face visit, made revisions and my assessment and plan is as follows.    ASSESSMENT & PLAN:   MGUS (monoclonal gammopathy of unknown significance) 1.  IgG kappa MGUS: -Bone marrow biopsy in March 2016 shows 9% plasma cells with kappa restriction. - Skeletal survey from 07/11/2017 shows stable T7 compression deformity with no new lytic lesions. -Blood work from 03/22/2018 shows M spike of 1.7 g/dL (down from 1.9 g 4 months ago), normal creatinine and calcium.  Hemoglobin was normal.  Free light chain ratio was 50.53, previously 57.56.  Kappa free light chains are 343, previously 351. -She does not have any "crab" features. -She complains of tenderness in anterior ribs for the last few weeks.  I will obtain skeletal survey to rule out lytic lesions. -We will see her back in 4 months with repeat blood work.  2.  Osteoporosis: -Bone density test on 11/23/2017 shows T score of -3.5. -She will continue Fosamax 70 mg weekly.  She will also continue calcium and vitamin D  supplements.  3.  Health maintenance: -I have reviewed her mammogram from 11/23/2017 which was BI-RADS 1.       Orders placed this encounter:  Orders Placed This Encounter  Procedures  . DG Bone Survey Met  . CBC with Differential/Platelet  . Comprehensive metabolic panel  . Protein electrophoresis, serum  . Kappa/lambda light chains  . Lactate dehydrogenase      Sreedhar Katragadda, MD [facility] [phone]  

## 2018-03-31 NOTE — Assessment & Plan Note (Addendum)
1.  IgG kappa MGUS: -Bone marrow biopsy in March 2016 shows 9% plasma cells with kappa restriction. - Skeletal survey from 07/11/2017 shows stable T7 compression deformity with no new lytic lesions. -Blood work from 03/22/2018 shows M spike of 1.7 g/dL (down from 1.9 g 4 months ago), normal creatinine and calcium.  Hemoglobin was normal.  Free light chain ratio was 50.53, previously 57.56.  Kappa free light chains are 343, previously 351. -She does not have any "crab" features. -She complains of tenderness in anterior ribs for the last few weeks.  I will obtain skeletal survey to rule out lytic lesions. -We will see her back in 4 months with repeat blood work.  2.  Osteoporosis: -Bone density test on 11/23/2017 shows T score of -3.5. -She will continue Fosamax 70 mg weekly.  She will also continue calcium and vitamin D supplements.  3.  Health maintenance: -I have reviewed her mammogram from 11/23/2017 which was BI-RADS 1.

## 2018-03-31 NOTE — Patient Instructions (Addendum)
[place] at [facility] Discharge Instructions  You were seen today by Dr. Delton Coombes. He went over your recent lab results. He will get a bone survey today and will call with results. He will see you back in 4 months for labs and follow up.    Thank you for choosing [place] at [facility] to provide your oncology and hematology care.  To afford each patient quality time with our provider, please arrive at least 15 minutes before your scheduled appointment time.   If you have a lab appointment with the [facility] please come in thru the  Main Entrance and check in at the main information desk  You need to re-schedule your appointment should you arrive 10 or more minutes late.  We strive to give you quality time with our providers, and arriving late affects you and other patients whose appointments are after yours.  Also, if you no show three or more times for appointments you may be dismissed from the clinic at the providers discretion.     Again, thank you for choosing [facility].  Our hope is that these requests will decrease the amount of time that you wait before being seen by our physicians.       _____________________________________________________________  Should you have questions after your visit to [facility], please contact our office at [phone] between the hours of 8:00 a.m. and 4:30 p.m.  Voicemails left after 4:00 p.m. will not be returned until the following business day.  For prescription refill requests, have your pharmacy contact our office and allow 72 hours.    Cancer Center Support Programs:   > Cancer Support Group  2nd Tuesday of the month 1pm-2pm, Journey Room

## 2018-06-14 DIAGNOSIS — Z681 Body mass index (BMI) 19 or less, adult: Secondary | ICD-10-CM | POA: Diagnosis not present

## 2018-06-14 DIAGNOSIS — J449 Chronic obstructive pulmonary disease, unspecified: Secondary | ICD-10-CM | POA: Diagnosis not present

## 2018-06-14 DIAGNOSIS — Z1389 Encounter for screening for other disorder: Secondary | ICD-10-CM | POA: Diagnosis not present

## 2018-06-14 DIAGNOSIS — R69 Illness, unspecified: Secondary | ICD-10-CM | POA: Diagnosis not present

## 2018-08-01 ENCOUNTER — Inpatient Hospital Stay (HOSPITAL_COMMUNITY): Payer: Medicare HMO | Attending: Hematology

## 2018-08-01 ENCOUNTER — Other Ambulatory Visit: Payer: Self-pay

## 2018-08-01 DIAGNOSIS — Z87891 Personal history of nicotine dependence: Secondary | ICD-10-CM | POA: Insufficient documentation

## 2018-08-01 DIAGNOSIS — M818 Other osteoporosis without current pathological fracture: Secondary | ICD-10-CM | POA: Diagnosis not present

## 2018-08-01 DIAGNOSIS — D472 Monoclonal gammopathy: Secondary | ICD-10-CM

## 2018-08-01 LAB — COMPREHENSIVE METABOLIC PANEL
ALT: 12 U/L (ref 0–44)
AST: 17 U/L (ref 15–41)
Albumin: 3.8 g/dL (ref 3.5–5.0)
Alkaline Phosphatase: 54 U/L (ref 38–126)
Anion gap: 7 (ref 5–15)
BUN: 14 mg/dL (ref 8–23)
CO2: 23 mmol/L (ref 22–32)
Calcium: 8.6 mg/dL — ABNORMAL LOW (ref 8.9–10.3)
Chloride: 107 mmol/L (ref 98–111)
Creatinine, Ser: 0.69 mg/dL (ref 0.44–1.00)
GFR calc Af Amer: >60 mL/min (ref >60)
GFR calc non Af Amer: >60 mL/min (ref >60)
Glucose, Bld: 85 mg/dL (ref 70–99)
Potassium: 3.6 mmol/L (ref 3.5–5.1)
Sodium: 137 mmol/L (ref 135–145)
Total Bilirubin: 0.7 mg/dL (ref 0.3–1.2)
Total Protein: 7.5 g/dL (ref 6.5–8.1)

## 2018-08-01 LAB — CBC WITH DIFFERENTIAL/PLATELET
Abs Immature Granulocytes: 0.01 10*3/uL (ref 0.00–0.07)
Basophils Absolute: 0.1 10*3/uL (ref 0.0–0.1)
Basophils Relative: 1 %
Eosinophils Absolute: 0.3 10*3/uL (ref 0.0–0.5)
Eosinophils Relative: 5 %
HCT: 42 % (ref 36.0–46.0)
Hemoglobin: 14 g/dL (ref 12.0–15.0)
Immature Granulocytes: 0 %
Lymphocytes Relative: 42 %
Lymphs Abs: 2.3 10*3/uL (ref 0.7–4.0)
MCH: 31 pg (ref 26.0–34.0)
MCHC: 33.3 g/dL (ref 30.0–36.0)
MCV: 92.9 fL (ref 80.0–100.0)
Monocytes Absolute: 0.3 10*3/uL (ref 0.1–1.0)
Monocytes Relative: 6 %
Neutro Abs: 2.5 10*3/uL (ref 1.7–7.7)
Neutrophils Relative %: 46 %
Platelets: 306 10*3/uL (ref 150–400)
RBC: 4.52 MIL/uL (ref 3.87–5.11)
RDW: 14.2 % (ref 11.5–15.5)
WBC: 5.5 10*3/uL (ref 4.0–10.5)
nRBC: 0 % (ref 0.0–0.2)

## 2018-08-01 LAB — LACTATE DEHYDROGENASE: LDH: 92 U/L — ABNORMAL LOW (ref 98–192)

## 2018-08-02 LAB — KAPPA/LAMBDA LIGHT CHAINS
Kappa free light chain: 309.8 mg/L — ABNORMAL HIGH (ref 3.3–19.4)
Kappa, lambda light chain ratio: 79.44 — ABNORMAL HIGH (ref 0.26–1.65)
Lambda free light chains: 3.9 mg/L — ABNORMAL LOW (ref 5.7–26.3)

## 2018-08-02 LAB — PROTEIN ELECTROPHORESIS, SERUM
A/G Ratio: 1.3 (ref 0.7–1.7)
Albumin ELP: 4 g/dL (ref 2.9–4.4)
Alpha-1-Globulin: 0.2 g/dL (ref 0.0–0.4)
Alpha-2-Globulin: 0.8 g/dL (ref 0.4–1.0)
Beta Globulin: 0.5 g/dL — ABNORMAL LOW (ref 0.7–1.3)
Gamma Globulin: 1.7 g/dL (ref 0.4–1.8)
Globulin, Total: 3.1 g/dL (ref 2.2–3.9)
M-Spike, %: 1.6 g/dL — ABNORMAL HIGH
Total Protein ELP: 7.1 g/dL (ref 6.0–8.5)

## 2018-08-08 ENCOUNTER — Encounter (HOSPITAL_COMMUNITY): Payer: Self-pay | Admitting: Hematology

## 2018-08-08 ENCOUNTER — Other Ambulatory Visit: Payer: Self-pay

## 2018-08-08 ENCOUNTER — Inpatient Hospital Stay (HOSPITAL_BASED_OUTPATIENT_CLINIC_OR_DEPARTMENT_OTHER): Payer: Medicare HMO | Admitting: Hematology

## 2018-08-08 VITALS — BP 94/63 | HR 98 | Temp 97.1°F | Resp 16 | Wt 92.2 lb

## 2018-08-08 DIAGNOSIS — D472 Monoclonal gammopathy: Secondary | ICD-10-CM

## 2018-08-08 DIAGNOSIS — Z87891 Personal history of nicotine dependence: Secondary | ICD-10-CM | POA: Diagnosis not present

## 2018-08-08 DIAGNOSIS — M818 Other osteoporosis without current pathological fracture: Secondary | ICD-10-CM

## 2018-08-08 DIAGNOSIS — F17210 Nicotine dependence, cigarettes, uncomplicated: Secondary | ICD-10-CM

## 2018-08-08 DIAGNOSIS — R69 Illness, unspecified: Secondary | ICD-10-CM | POA: Diagnosis not present

## 2018-08-08 NOTE — Assessment & Plan Note (Signed)
1.  IgG kappa MGUS: - Bone marrow biopsy in March 2016 shows 9% plasma cells with kappa restriction. -Skeletal survey from 07/11/2017 shows stable T7 compression deformity with no new lytic lesions. - We reviewed blood work from 08/01/2018.  M spike is 1.6.  Creatinine is 0.69.  Calcium is 8.6.  Hemoglobin is 14. - Free light chain ratio has increased to 79.44 from 50.53.  Kappa light chains were 309.  Lambda light chain was 3.9. -In view of the changes in the free light chain ratio, I have recommended repeating scans in 4 months.  2.  Smoking history: -She smoked 1 pack/day for the past 42 years. -I have counseled her about lung cancer screening CT scan which is known to improve survival.  We will arrange it prior to next visit in 4 months.  3.  Osteoporosis: -Bone density on 11/23/2017 shows T score of -3.5. -She is continuing Fosamax 70 mg weekly.  She will continue calcium and vitamin D supplements.  4.  Health maintenance: - Mammogram on 11/23/2017 was BI-RADS 1.

## 2018-08-08 NOTE — Patient Instructions (Addendum)
[facility] at [facility] Discharge Instructions  You were seen today by Dr. Katragadda. He went over your recent lab results. He will see you back in 4 months for labs and follow up.   Thank you for choosing [facility] at [facility] to provide your oncology and hematology care.  To afford each patient quality time with our provider, please arrive at least 15 minutes before your scheduled appointment time.   If you have a lab appointment with the Cancer Center please come in thru the  Main Entrance and check in at the main information desk  You need to re-schedule your appointment should you arrive 10 or more minutes late.  We strive to give you quality time with our providers, and arriving late affects you and other patients whose appointments are after yours.  Also, if you no show three or more times for appointments you may be dismissed from the clinic at the providers discretion.     Again, thank you for choosing [facility].  Our hope is that these requests will decrease the amount of time that you wait before being seen by our physicians.       _____________________________________________________________  Should you have questions after your visit to  Cancer Center, please contact our office at [phone] between the hours of 8:00 a.m. and 4:30 p.m.  Voicemails left after 4:00 p.m. will not be returned until the following business day.  For prescription refill requests, have your pharmacy contact our office and allow 72 hours.    Cancer Center Support Programs:   > Cancer Support Group  2nd Tuesday of the month 1pm-2pm, Journey Room    

## 2018-08-08 NOTE — Progress Notes (Signed)
[place], [place] [address]   CLINIC:  Medical Oncology/Hematology  PCP:  [facility], [place] [address] [phone]   REASON FOR VISIT:  Follow-up for  MGUS  CURRENT THERAPY: Observation    INTERVAL HISTORY:  Misty Richardson 63 y.o. female seen for follow-up of MGUS.  Denies any new onset bone pains.  Shortness of breath on exertion is stable from her COPD.  Pain is reported as 0.  Appetite is 100%.  Energy levels are 50%.  Denies any fevers, night sweats or weight loss in the last 6 months.  Denies any ER visits or hospitalizations.  No nausea, vomiting, diarrhea or constipation reported.  No bleeding per rectum or melena.  REVIEW OF SYSTEMS:  Review of Systems  Respiratory: Positive for shortness of breath.   All other systems reviewed and are negative.    PAST MEDICAL/SURGICAL HISTORY:  Past Medical History:  Diagnosis Date  . COPD (chronic obstructive pulmonary disease) ([place])   . COPD (chronic obstructive pulmonary disease) ([place]) 09/05/2012  . MGUS (monoclonal gammopathy of unknown significance) 09/05/2012  . Osteoporosis 03/14/2014   Past Surgical History:  Procedure Laterality Date  . ABDOMINAL HYSTERECTOMY    . APPENDECTOMY    . BONE BIOPSY Left 03/25/14  . BONE MARROW ASPIRATION Left 03/25/14  . BONE MARROW BIOPSY Left March 2016  . CESAREAN SECTION       SOCIAL HISTORY:  Social History   Socioeconomic History  . Marital status: Divorced    Spouse name: Not on file  . Number of children: Not on file  . Years of education: Not on file  . Highest education level: Not on file  Occupational History  . Not on file  Social Needs  . Financial resource strain: Not on file  . Food insecurity    Worry: Not on file    Inability: Not on file  . Transportation needs    Medical: Not on file    Non-medical: Not on file  Tobacco Use  . Smoking status: Former Smoker    Packs/day: 1.00   Types: Cigarettes    Quit date: 07/11/2017    Years since quitting: 1.0  . Smokeless tobacco: Former Systems developer    Quit date: 07/31/2012  Substance and Sexual Activity  . Alcohol use: Yes    Comment: occasionally  . Drug use: No  . Sexual activity: Yes    Birth control/protection: None  Lifestyle  . Physical activity    Days per week: Not on file    Minutes per session: Not on file  . Stress: Not on file  Relationships  . Social Herbalist on phone: Not on file    Gets together: Not on file    Attends religious service: Not on file    Active member of club or organization: Not on file    Attends meetings of clubs or organizations: Not on file    Relationship status: Not on file  . Intimate partner violence    Fear of current or ex partner: Not on file    Emotionally abused: Not on file    Physically abused: Not on file    Forced sexual activity: Not on file  Other Topics Concern  . Not on file  Social History Narrative  . Not on file    FAMILY HISTORY:  Family History  Problem Relation Age of Onset  . Heart failure Father  CURRENT MEDICATIONS:  Outpatient Encounter Medications as of 08/08/2018  Medication Sig  . albuterol (PROVENTIL) (2.5 MG/3ML) 0.083% nebulizer solution Take 2.5 mg by nebulization every 6 (six) hours as needed for wheezing or shortness of breath.  Marland Kitchen albuterol (ACCUNEB) 1.25 MG/3ML nebulizer solution Take 1 ampule by nebulization every 6 (six) hours as needed. Reported on 07/30/2015  . albuterol (PROVENTIL HFA;VENTOLIN HFA) 108 (90 BASE) MCG/ACT inhaler Inhale 2 puffs into the lungs every 6 (six) hours as needed for wheezing or shortness of breath. Reported on 02/06/2015  . alendronate (FOSAMAX) 70 MG tablet TAKE 1 TABLET (70 MG TOTAL) BY MOUTH ONCE A WEEK  . ALPRAZolam (XANAX) 1 MG tablet Take 1 mg by mouth at bedtime as needed.   . budesonide-formoterol (SYMBICORT) 160-4.5 MCG/ACT inhaler Inhale 2 puffs into the lungs 2 (two) times daily.  .  Calcium Carbonate-Vitamin D (CALCIUM 600+D) 600-200 MG-UNIT TABS Take 1 tablet by mouth 2 (two) times daily.  Marland Kitchen gabapentin (NEURONTIN) 300 MG capsule   . HYDROcodone-acetaminophen (NORCO/VICODIN) 5-325 MG tablet Take one tab po q 4-6 hrs prn pain (Patient not taking: Reported on 03/31/2018)  . ibuprofen (ADVIL,MOTRIN) 600 MG tablet Take 600 mg by mouth as needed.   . meloxicam (MOBIC) 15 MG tablet   . [DISCONTINUED] albuterol (PROVENTIL) (2.5 MG/3ML) 0.083% nebulizer solution    No facility-administered encounter medications on file as of 08/08/2018.     ALLERGIES:  Allergies  Allergen Reactions  . Avelox [Moxifloxacin Hcl In Nacl] Rash     PHYSICAL EXAM:  ECOG Performance status: 1  Vitals:   08/08/18 1100  BP: 94/63  Pulse: 98  Resp: 16  Temp: (!) 97.1 F (36.2 C)  SpO2: 99%   Filed Weights   08/08/18 1100  Weight: 92 lb 3.2 oz (41.8 kg)    Physical Exam Constitutional:      Appearance: Normal appearance.  Cardiovascular:     Rate and Rhythm: Normal rate and regular rhythm.     Pulses: Normal pulses.  Pulmonary:     Effort: Pulmonary effort is normal.     Breath sounds: Normal breath sounds.  Abdominal:     General: Bowel sounds are normal. There is no distension.     Palpations: Abdomen is soft.  Skin:    General: Skin is warm.  Neurological:     General: No focal deficit present.     Mental Status: She is alert and oriented to person, place, and time.  Psychiatric:        Mood and Affect: Mood normal.        Behavior: Behavior normal.      LABORATORY DATA:  I have reviewed the labs as listed.  CBC    Component Value Date/Time   WBC 5.5 08/01/2018 1100   RBC 4.52 08/01/2018 1100   HGB 14.0 08/01/2018 1100   HCT 42.0 08/01/2018 1100   PLT 306 08/01/2018 1100   MCV 92.9 08/01/2018 1100   MCH 31.0 08/01/2018 1100   MCHC 33.3 08/01/2018 1100   RDW 14.2 08/01/2018 1100   LYMPHSABS 2.3 08/01/2018 1100   MONOABS 0.3 08/01/2018 1100   EOSABS 0.3  08/01/2018 1100   BASOSABS 0.1 08/01/2018 1100   CMP Latest Ref Rng & Units 08/01/2018 03/22/2018 11/14/2017  Glucose 70 - 99 mg/dL 85 77 107(H)  BUN 8 - 23 mg/dL '14 18 14  '$ Creatinine 0.44 - 1.00 mg/dL 0.69 0.73 0.73  Sodium 135 - 145 mmol/L 137 134(L) 136  Potassium 3.5 -  5.1 mmol/L 3.6 3.9 3.9  Chloride 98 - 111 mmol/L 107 103 107  CO2 22 - 32 mmol/L 23 23 21(L)  Calcium 8.9 - 10.3 mg/dL 8.6(L) 9.5 9.3  Total Protein 6.5 - 8.1 g/dL 7.5 8.7(H) 8.8(H)  Total Bilirubin 0.3 - 1.2 mg/dL 0.7 0.3 0.7  Alkaline Phos 38 - 126 U/L 54 68 56  AST 15 - 41 U/L '17 20 25  '$ ALT 0 - 44 U/L '12 18 20       '$ DIAGNOSTIC IMAGING:  I have independently reviewed the scans and discussed with the patient.   I have reviewed Venita Lick LPN's note and agree with the documentation.  I personally performed a face-to-face visit, made revisions and my assessment and plan is as follows.    ASSESSMENT & PLAN:   MGUS (monoclonal gammopathy of unknown significance) 1.  IgG kappa MGUS: - Bone marrow biopsy in March 2016 shows 9% plasma cells with kappa restriction. -Skeletal survey from 07/11/2017 shows stable T7 compression deformity with no new lytic lesions. - We reviewed blood work from 08/01/2018.  M spike is 1.6.  Creatinine is 0.69.  Calcium is 8.6.  Hemoglobin is 14. - Free light chain ratio has increased to 79.44 from 50.53.  Kappa light chains were 309.  Lambda light chain was 3.9. -In view of the changes in the free light chain ratio, I have recommended repeating scans in 4 months.  2.  Smoking history: -She smoked 1 pack/day for the past 42 years. -I have counseled her about lung cancer screening CT scan which is known to improve survival.  We will arrange it prior to next visit in 4 months.  3.  Osteoporosis: -Bone density on 11/23/2017 shows T score of -3.5. -She is continuing Fosamax 70 mg weekly.  She will continue calcium and vitamin D supplements.  4.  Health maintenance: - Mammogram on  11/23/2017 was BI-RADS 1.  Total time spent is 25 minutes with more than 50% of time spent face-to-face discussing lab results, counseling and coordination of care.    Orders placed this encounter:  Orders Placed This Encounter  Procedures  . CT CHEST LUNG CA SCREEN LOW DOSE W/O CM  . CBC with Differential/Platelet  . Comprehensive metabolic panel  . Protein electrophoresis, serum  . Kappa/lambda light chains  . Lactate dehydrogenase      Derek Jack, MD [place] [phone]

## 2018-08-09 ENCOUNTER — Encounter (HOSPITAL_COMMUNITY): Payer: Self-pay | Admitting: *Deleted

## 2018-08-28 DIAGNOSIS — G4700 Insomnia, unspecified: Secondary | ICD-10-CM | POA: Diagnosis not present

## 2018-08-28 DIAGNOSIS — J449 Chronic obstructive pulmonary disease, unspecified: Secondary | ICD-10-CM | POA: Diagnosis not present

## 2018-08-28 DIAGNOSIS — J329 Chronic sinusitis, unspecified: Secondary | ICD-10-CM | POA: Diagnosis not present

## 2018-08-28 DIAGNOSIS — R69 Illness, unspecified: Secondary | ICD-10-CM | POA: Diagnosis not present

## 2018-08-28 DIAGNOSIS — Z681 Body mass index (BMI) 19 or less, adult: Secondary | ICD-10-CM | POA: Diagnosis not present

## 2018-09-27 ENCOUNTER — Encounter (HOSPITAL_COMMUNITY): Payer: Self-pay | Admitting: *Deleted

## 2018-10-18 DIAGNOSIS — Z23 Encounter for immunization: Secondary | ICD-10-CM | POA: Diagnosis not present

## 2018-10-26 ENCOUNTER — Encounter (HOSPITAL_COMMUNITY): Payer: Self-pay | Admitting: *Deleted

## 2018-10-26 NOTE — Progress Notes (Signed)
Received referral for initial lung cancer screening scan.  Contacted patient and obtained smoking history (started age 63, current smoker, 46 pack year) as well as answering questions related to the screening process.  Patient denies signs/symptoms of lung cancer such as weight loss or hemoptysis.  Patient denies comorbidity that would prevent curative treatment if lung cancer were to be found.  Patient is scheduled for shared decision making visit and CT scan on 11/13 at 1100.

## 2018-11-09 ENCOUNTER — Other Ambulatory Visit (HOSPITAL_COMMUNITY): Payer: Self-pay | Admitting: Internal Medicine

## 2018-11-09 DIAGNOSIS — Z1231 Encounter for screening mammogram for malignant neoplasm of breast: Secondary | ICD-10-CM

## 2018-11-27 ENCOUNTER — Other Ambulatory Visit: Payer: Self-pay

## 2018-11-27 ENCOUNTER — Ambulatory Visit (HOSPITAL_COMMUNITY)
Admission: RE | Admit: 2018-11-27 | Discharge: 2018-11-27 | Disposition: A | Discharge status: Home / Self Care | Payer: Medicare HMO | Source: Ambulatory Visit | Attending: Internal Medicine | Admitting: Internal Medicine

## 2018-11-27 ENCOUNTER — Other Ambulatory Visit (HOSPITAL_COMMUNITY): Payer: Self-pay | Admitting: Nurse Practitioner

## 2018-11-27 DIAGNOSIS — Z1231 Encounter for screening mammogram for malignant neoplasm of breast: Secondary | ICD-10-CM | POA: Diagnosis not present

## 2018-11-27 DIAGNOSIS — M810 Age-related osteoporosis without current pathological fracture: Secondary | ICD-10-CM

## 2018-12-01 ENCOUNTER — Ambulatory Visit (HOSPITAL_COMMUNITY)
Admission: RE | Admit: 2018-12-01 | Discharge: 2018-12-01 | Disposition: A | Discharge status: Home / Self Care | Payer: Medicare HMO | Source: Ambulatory Visit | Attending: Hematology | Admitting: Hematology

## 2018-12-01 ENCOUNTER — Other Ambulatory Visit: Payer: Self-pay

## 2018-12-01 ENCOUNTER — Other Ambulatory Visit (HOSPITAL_COMMUNITY): Payer: Medicare HMO

## 2018-12-01 ENCOUNTER — Inpatient Hospital Stay (HOSPITAL_COMMUNITY): Payer: Medicare HMO | Attending: Hematology

## 2018-12-01 DIAGNOSIS — F17210 Nicotine dependence, cigarettes, uncomplicated: Secondary | ICD-10-CM

## 2018-12-01 DIAGNOSIS — D472 Monoclonal gammopathy: Secondary | ICD-10-CM | POA: Diagnosis not present

## 2018-12-01 DIAGNOSIS — R69 Illness, unspecified: Secondary | ICD-10-CM | POA: Diagnosis not present

## 2018-12-01 DIAGNOSIS — J449 Chronic obstructive pulmonary disease, unspecified: Secondary | ICD-10-CM | POA: Insufficient documentation

## 2018-12-01 DIAGNOSIS — Z79899 Other long term (current) drug therapy: Secondary | ICD-10-CM | POA: Insufficient documentation

## 2018-12-01 DIAGNOSIS — Z87891 Personal history of nicotine dependence: Secondary | ICD-10-CM | POA: Diagnosis not present

## 2018-12-01 DIAGNOSIS — M818 Other osteoporosis without current pathological fracture: Secondary | ICD-10-CM | POA: Insufficient documentation

## 2018-12-01 LAB — COMPREHENSIVE METABOLIC PANEL
ALT: 16 U/L (ref 0–44)
AST: 19 U/L (ref 15–41)
Albumin: 4.2 g/dL (ref 3.5–5.0)
Alkaline Phosphatase: 70 U/L (ref 38–126)
Anion gap: 7 (ref 5–15)
BUN: 18 mg/dL (ref 8–23)
CO2: 23 mmol/L (ref 22–32)
Calcium: 9 mg/dL (ref 8.9–10.3)
Chloride: 106 mmol/L (ref 98–111)
Creatinine, Ser: 0.7 mg/dL (ref 0.44–1.00)
GFR calc Af Amer: >60 mL/min (ref >60)
GFR calc non Af Amer: >60 mL/min (ref >60)
Glucose, Bld: 89 mg/dL (ref 70–99)
Potassium: 3.9 mmol/L (ref 3.5–5.1)
Sodium: 136 mmol/L (ref 135–145)
Total Bilirubin: 0.5 mg/dL (ref 0.3–1.2)
Total Protein: 8.3 g/dL — ABNORMAL HIGH (ref 6.5–8.1)

## 2018-12-01 LAB — CBC WITH DIFFERENTIAL/PLATELET
Abs Immature Granulocytes: 0.01 10*3/uL (ref 0.00–0.07)
Basophils Absolute: 0.1 10*3/uL (ref 0.0–0.1)
Basophils Relative: 1 %
Eosinophils Absolute: 0.6 10*3/uL — ABNORMAL HIGH (ref 0.0–0.5)
Eosinophils Relative: 10 %
HCT: 43.5 % (ref 36.0–46.0)
Hemoglobin: 14 g/dL (ref 12.0–15.0)
Immature Granulocytes: 0 %
Lymphocytes Relative: 42 %
Lymphs Abs: 2.7 10*3/uL (ref 0.7–4.0)
MCH: 31 pg (ref 26.0–34.0)
MCHC: 32.2 g/dL (ref 30.0–36.0)
MCV: 96.2 fL (ref 80.0–100.0)
Monocytes Absolute: 0.4 10*3/uL (ref 0.1–1.0)
Monocytes Relative: 7 %
Neutro Abs: 2.6 10*3/uL (ref 1.7–7.7)
Neutrophils Relative %: 40 %
Platelets: 348 10*3/uL (ref 150–400)
RBC: 4.52 MIL/uL (ref 3.87–5.11)
RDW: 13.1 % (ref 11.5–15.5)
WBC: 6.4 10*3/uL (ref 4.0–10.5)
nRBC: 0 % (ref 0.0–0.2)

## 2018-12-01 LAB — LACTATE DEHYDROGENASE: LDH: 85 U/L — ABNORMAL LOW (ref 98–192)

## 2018-12-04 ENCOUNTER — Encounter (HOSPITAL_COMMUNITY): Payer: Self-pay | Admitting: *Deleted

## 2018-12-04 ENCOUNTER — Telehealth (HOSPITAL_COMMUNITY): Payer: Self-pay | Admitting: *Deleted

## 2018-12-04 LAB — PROTEIN ELECTROPHORESIS, SERUM
A/G Ratio: 1.1 (ref 0.7–1.7)
Albumin ELP: 3.9 g/dL (ref 2.9–4.4)
Alpha-1-Globulin: 0.3 g/dL (ref 0.0–0.4)
Alpha-2-Globulin: 0.7 g/dL (ref 0.4–1.0)
Beta Globulin: 0.8 g/dL (ref 0.7–1.3)
Gamma Globulin: 1.8 g/dL (ref 0.4–1.8)
Globulin, Total: 3.6 g/dL (ref 2.2–3.9)
M-Spike, %: 1.6 g/dL — ABNORMAL HIGH
Total Protein ELP: 7.5 g/dL (ref 6.0–8.5)

## 2018-12-04 LAB — KAPPA/LAMBDA LIGHT CHAINS
Kappa free light chain: 261.1 mg/L — ABNORMAL HIGH (ref 3.3–19.4)
Kappa, lambda light chain ratio: 24.18 — ABNORMAL HIGH (ref 0.26–1.65)
Lambda free light chains: 10.8 mg/L (ref 5.7–26.3)

## 2018-12-04 NOTE — Telephone Encounter (Signed)
Patient notified via telephone of LDCT lung cancer screening results with recommendations to follow up in 12 months.  Also notified of incidental findings and need to follow up with PCP. Patient verbalizes understanding.  I will also mail her a copy of the results.

## 2018-12-08 ENCOUNTER — Other Ambulatory Visit: Payer: Self-pay

## 2018-12-08 ENCOUNTER — Inpatient Hospital Stay (HOSPITAL_BASED_OUTPATIENT_CLINIC_OR_DEPARTMENT_OTHER): Payer: Medicare HMO | Admitting: Hematology

## 2018-12-08 ENCOUNTER — Ambulatory Visit (HOSPITAL_COMMUNITY): Payer: Medicare HMO | Admitting: Hematology

## 2018-12-08 DIAGNOSIS — J449 Chronic obstructive pulmonary disease, unspecified: Secondary | ICD-10-CM | POA: Diagnosis not present

## 2018-12-08 DIAGNOSIS — D472 Monoclonal gammopathy: Secondary | ICD-10-CM | POA: Diagnosis not present

## 2018-12-08 DIAGNOSIS — M818 Other osteoporosis without current pathological fracture: Secondary | ICD-10-CM | POA: Diagnosis not present

## 2018-12-08 DIAGNOSIS — Z79899 Other long term (current) drug therapy: Secondary | ICD-10-CM | POA: Diagnosis not present

## 2018-12-08 DIAGNOSIS — R69 Illness, unspecified: Secondary | ICD-10-CM | POA: Diagnosis not present

## 2018-12-08 NOTE — Assessment & Plan Note (Signed)
1.  IgG kappa MGUS: - Bone marrow biopsy in March 2016 shows 9% plasma cells with kappa restriction. -Skeletal survey from 07/11/2017 shows stable T7 compression deformity with no new lytic lesions. -Multiple myeloma labs remained stable serum protein electrophoresis 1.6 IFE shows a M spike in the gamma region.  Kappa light chains elevated at 261, lambda light chains 10.8 and ratio 24.18.  She has no evidence of endorgan disease, no anemia, no hypercalcemia, no renal insufficiency. -We will plan to repeat skeletal survey in March 2021 with repeat multiple myeloma labs. -Return to clinic in 4 months.   2.  Smoking history: -She smoked 1 pack/day for the past 42 years.  He is actively trying to stop smoking.  She is using nicotine patches. -Low-dose CT scan did reveal scattered tiny lung nodules bilaterally the largest is in the left lower lobe within the [place] diameter of 2.9 mm.  Lung RADS 2.  Recommendation is to repeat low-dose CT in 12 months.  3.  Osteoporosis: -Bone density on 11/23/2017 shows T score of -3.5. -She is continuing Fosamax 70 mg weekly.  She will continue calcium and vitamin D supplements.  4.  Health maintenance: - Mammogram on 11/23/2017 was BI-RADS 1. -Mammogram on abdomen 10/08/2018 was negative for malignancy.  BI-RADS category 1.

## 2018-12-08 NOTE — Progress Notes (Signed)
[address], [place] [address]   CLINIC:  Medical Oncology/Hematology  PCP:  [facility], [place] [address] [phone]   REASON FOR VISIT:  Follow-up for MGUS  CURRENT THERAPY: Clinical surveillance    INTERVAL HISTORY:  [name] 63 y.o. female presents today for follow-up.  She reports overall doing well.  She denies any significant changes in her health history since her last office visit.  Denies any significant fatigue.  Denies any change in bowel habits.  No weight loss.  Appetite is stable.  Reports shortness of breath on exertion.  Reports chronic nonproductive cough.  She states she has actively been trying to quit smoking.  She is currently using nicotine patches.  She states she has not smoked a cigarette in 1 week.  She is here to discuss most recent scans and labs.   REVIEW OF SYSTEMS:  Review of Systems  Constitutional: Negative.   HENT:  Negative.   Eyes: Negative.   Respiratory: Positive for cough.   Cardiovascular: Negative.   Gastrointestinal: Negative.   Endocrine: Negative.   Genitourinary: Negative.    Musculoskeletal: Negative.   Skin: Negative.   Neurological: Negative.   Hematological: Negative.   Psychiatric/Behavioral: Negative.      PAST MEDICAL/SURGICAL HISTORY:  Past Medical History:  Diagnosis Date  . COPD (chronic obstructive pulmonary disease) ([place])   . COPD (chronic obstructive pulmonary disease) ([place]) 09/05/2012  . MGUS (monoclonal gammopathy of unknown significance) 09/05/2012  . Osteoporosis 03/14/2014   Past Surgical History:  Procedure Laterality Date  . ABDOMINAL HYSTERECTOMY    . APPENDECTOMY    . BONE BIOPSY Left 03/25/14  . BONE MARROW ASPIRATION Left 03/25/14  . BONE MARROW BIOPSY Left March 2016  . CESAREAN SECTION       SOCIAL HISTORY:  Social History   Socioeconomic History  . Marital status: Divorced    Spouse name: Not on file  . Number  of children: Not on file  . Years of education: Not on file  . Highest education level: Not on file  Occupational History  . Not on file  Social Needs  . Financial resource strain: Not on file  . Food insecurity    Worry: Not on file    Inability: Not on file  . Transportation needs    Medical: Not on file    Non-medical: Not on file  Tobacco Use  . Smoking status: Former Smoker    Packs/day: 1.00    Types: Cigarettes    Quit date: 07/11/2017    Years since quitting: 1.4  . Smokeless tobacco: Former Systems developer    Quit date: 07/31/2012  Substance and Sexual Activity  . Alcohol use: Yes    Comment: occasionally  . Drug use: No  . Sexual activity: Yes    Birth control/protection: None  Lifestyle  . Physical activity    Days per week: Not on file    Minutes per session: Not on file  . Stress: Not on file  Relationships  . Social Herbalist on phone: Not on file    Gets together: Not on file    Attends religious service: Not on file    Active member of club or organization: Not on file    Attends meetings of clubs or organizations: Not on file    Relationship status: Not on file  . Intimate partner violence    Fear of current or ex partner:  Not on file    Emotionally abused: Not on file    Physically abused: Not on file    Forced sexual activity: Not on file  Other Topics Concern  . Not on file  Social History Narrative  . Not on file    FAMILY HISTORY:  Family History  Problem Relation Age of Onset  . Heart failure Father     CURRENT MEDICATIONS:  Outpatient Encounter Medications as of 12/08/2018  Medication Sig  . alendronate (FOSAMAX) 70 MG tablet TAKE ONE TABLET (70MG TOTAL) BY MOUTH ONCE A WEEK  . budesonide-formoterol (SYMBICORT) 160-4.5 MCG/ACT inhaler Inhale 2 puffs into the lungs 2 (two) times daily.  . Calcium Carbonate-Vitamin D (CALCIUM 600+D) 600-200 MG-UNIT TABS Take 1 tablet by mouth 2 (two) times daily.  Marland Kitchen ibuprofen (ADVIL,MOTRIN) 600 MG  tablet Take 600 mg by mouth as needed.   Marland Kitchen albuterol (ACCUNEB) 1.25 MG/3ML nebulizer solution Take 1 ampule by nebulization every 6 (six) hours as needed. Reported on 07/30/2015  . albuterol (PROVENTIL HFA;VENTOLIN HFA) 108 (90 BASE) MCG/ACT inhaler Inhale 2 puffs into the lungs every 6 (six) hours as needed for wheezing or shortness of breath. Reported on 02/06/2015  . albuterol (PROVENTIL) (2.5 MG/3ML) 0.083% nebulizer solution Take 2.5 mg by nebulization every 6 (six) hours as needed for wheezing or shortness of breath.  . ALPRAZolam (XANAX) 1 MG tablet Take 1 mg by mouth at bedtime as needed.   . [DISCONTINUED] amoxicillin-clavulanate (AUGMENTIN) 875-125 MG tablet Take 1 tablet by mouth 2 (two) times daily.  . [DISCONTINUED] gabapentin (NEURONTIN) 300 MG capsule   . [DISCONTINUED] HYDROcodone-acetaminophen (NORCO/VICODIN) 5-325 MG tablet Take one tab po q 4-6 hrs prn pain (Patient not taking: Reported on 03/31/2018)  . [DISCONTINUED] meloxicam (MOBIC) 15 MG tablet    No facility-administered encounter medications on file as of 12/08/2018.     ALLERGIES:  Allergies  Allergen Reactions  . Avelox [Moxifloxacin Hcl In Nacl] Rash     PHYSICAL EXAM:  ECOG Performance status: 1  Vitals:   12/08/18 0901  BP: 109/80  Pulse: 84  Resp: 16  Temp: (!) 96.9 F (36.1 C)  SpO2: 96%   Filed Weights   12/08/18 0901  Weight: 96 lb 12.8 oz (43.9 kg)    Physical Exam Constitutional:      Appearance: Normal appearance. She is obese.  HENT:     Head: Normocephalic.     Right Ear: External ear normal.     Left Ear: External ear normal.     Nose: Nose normal.     Mouth/Throat:     Pharynx: Oropharynx is clear.  Eyes:     Conjunctiva/sclera: Conjunctivae normal.  Neck:     Musculoskeletal: Normal range of motion.  Cardiovascular:     Rate and Rhythm: Normal rate and regular rhythm.     Pulses: Normal pulses.     Heart sounds: Normal heart sounds.  Pulmonary:     Effort: Pulmonary  effort is normal.     Breath sounds: Normal breath sounds.  Abdominal:     General: Bowel sounds are normal.  Musculoskeletal: Normal range of motion.  Skin:    General: Skin is warm.  Neurological:     General: No focal deficit present.     Mental Status: She is alert and oriented to person, place, and time.  Psychiatric:        Mood and Affect: Mood normal.        Behavior: Behavior normal.  Thought Content: Thought content normal.        Judgment: Judgment normal.      LABORATORY DATA:  I have reviewed the labs as listed.  CBC    Component Value Date/Time   WBC 6.4 12/01/2018 1018   RBC 4.52 12/01/2018 1018   HGB 14.0 12/01/2018 1018   HCT 43.5 12/01/2018 1018   PLT 348 12/01/2018 1018   MCV 96.2 12/01/2018 1018   MCH 31.0 12/01/2018 1018   MCHC 32.2 12/01/2018 1018   RDW 13.1 12/01/2018 1018   LYMPHSABS 2.7 12/01/2018 1018   MONOABS 0.4 12/01/2018 1018   EOSABS 0.6 (H) 12/01/2018 1018   BASOSABS 0.1 12/01/2018 1018   CMP Latest Ref Rng & Units 12/01/2018 08/01/2018 03/22/2018  Glucose 70 - 99 mg/dL 89 85 77  BUN 8 - 23 mg/dL _0 Creatinine 0.44 - 1.00 mg/dL 0.70 0.69 0.73  Sodium 135 - 145 mmol/L 136 137 134(L)  Potassium 3.5 - 5.1 mmol/L 3.9 3.6 3.9  Chloride 98 - 111 mmol/L 106 107 103  CO2 22 - 32 mmol/L _1 Calcium 8.9 - 10.3 mg/dL 9.0 8.6(L) 9.5  Total Protein 6.5 - 8.1 g/dL 8.3(H) 7.5 8.7(H)  Total Bilirubin 0.3 - 1.2 mg/dL 0.5 0.7 0.3  Alkaline Phos 38 - 126 U/L 70 54 68  AST 15 - 41 U/L _2 ALT 0 - 44 U/L _3 ASSESSMENT & PLAN:   MGUS (monoclonal gammopathy of unknown significance) 1.  IgG kappa MGUS: - Bone marrow biopsy in March 2016 shows 9% plasma cells with kappa restriction. -Skeletal survey from 07/11/2017 shows stable T7 compression deformity with no new lytic lesions. -Multiple myeloma labs remained stable serum protein electrophoresis 1.6 IFE shows a M spike in the gamma region.  Kappa light chains  elevated at 261, lambda light chains 10.8 and ratio 24.18.  She has no evidence of endorgan disease, no anemia, no hypercalcemia, no renal insufficiency. -We will plan to repeat skeletal survey in March 2021 with repeat multiple myeloma labs. -Return to clinic in 4 months.   2.  Smoking history: -She smoked 1 pack/day for the past 42 years.  He is actively trying to stop smoking.  She is using nicotine patches. -Low-dose CT scan did reveal scattered tiny lung nodules bilaterally the largest is in the left lower lobe within the [place] diameter of 2.9 mm.  Lung RADS 2.  Recommendation is to repeat low-dose CT in 12 months.  3.  Osteoporosis: -Bone density on 11/23/2017 shows T score of -3.5. -She is continuing Fosamax 70 mg weekly.  She will continue calcium and vitamin D supplements.  4.  Health maintenance: - Mammogram on 11/23/2017 was BI-RADS 1. -Mammogram on abdomen 10/08/2018 was negative for malignancy.  BI-RADS category 1.      Orders placed this encounter:  No orders of the defined types were placed in this encounter.    [place] [phone]

## 2018-12-22 DIAGNOSIS — Z20828 Contact with and (suspected) exposure to other viral communicable diseases: Secondary | ICD-10-CM | POA: Diagnosis not present

## 2018-12-25 DIAGNOSIS — J22 Unspecified acute lower respiratory infection: Secondary | ICD-10-CM | POA: Diagnosis not present

## 2018-12-25 DIAGNOSIS — J449 Chronic obstructive pulmonary disease, unspecified: Secondary | ICD-10-CM | POA: Diagnosis not present

## 2018-12-25 DIAGNOSIS — Z681 Body mass index (BMI) 19 or less, adult: Secondary | ICD-10-CM | POA: Diagnosis not present

## 2019-03-14 DIAGNOSIS — E782 Mixed hyperlipidemia: Secondary | ICD-10-CM | POA: Diagnosis not present

## 2019-03-14 DIAGNOSIS — Z681 Body mass index (BMI) 19 or less, adult: Secondary | ICD-10-CM | POA: Diagnosis not present

## 2019-03-14 DIAGNOSIS — Z1389 Encounter for screening for other disorder: Secondary | ICD-10-CM | POA: Diagnosis not present

## 2019-03-14 DIAGNOSIS — R69 Illness, unspecified: Secondary | ICD-10-CM | POA: Diagnosis not present

## 2019-03-14 DIAGNOSIS — D472 Monoclonal gammopathy: Secondary | ICD-10-CM | POA: Diagnosis not present

## 2019-03-14 DIAGNOSIS — Z0000 Encounter for general adult medical examination without abnormal findings: Secondary | ICD-10-CM | POA: Diagnosis not present

## 2019-03-14 DIAGNOSIS — J449 Chronic obstructive pulmonary disease, unspecified: Secondary | ICD-10-CM | POA: Diagnosis not present

## 2019-04-04 ENCOUNTER — Other Ambulatory Visit (HOSPITAL_COMMUNITY): Payer: Self-pay | Admitting: *Deleted

## 2019-04-04 DIAGNOSIS — D472 Monoclonal gammopathy: Secondary | ICD-10-CM

## 2019-04-05 ENCOUNTER — Inpatient Hospital Stay (HOSPITAL_COMMUNITY): Payer: Medicare HMO | Attending: Hematology

## 2019-04-05 ENCOUNTER — Other Ambulatory Visit: Payer: Self-pay

## 2019-04-05 DIAGNOSIS — M818 Other osteoporosis without current pathological fracture: Secondary | ICD-10-CM | POA: Insufficient documentation

## 2019-04-05 DIAGNOSIS — D472 Monoclonal gammopathy: Secondary | ICD-10-CM | POA: Diagnosis present

## 2019-04-05 DIAGNOSIS — Z87891 Personal history of nicotine dependence: Secondary | ICD-10-CM | POA: Insufficient documentation

## 2019-04-05 DIAGNOSIS — Z79899 Other long term (current) drug therapy: Secondary | ICD-10-CM | POA: Insufficient documentation

## 2019-04-05 LAB — CBC WITH DIFFERENTIAL/PLATELET
Abs Immature Granulocytes: 0.01 10*3/uL (ref 0.00–0.07)
Basophils Absolute: 0.1 10*3/uL (ref 0.0–0.1)
Basophils Relative: 1 %
Eosinophils Absolute: 0.4 10*3/uL (ref 0.0–0.5)
Eosinophils Relative: 5 %
HCT: 45.1 % (ref 36.0–46.0)
Hemoglobin: 14.5 g/dL (ref 12.0–15.0)
Immature Granulocytes: 0 %
Lymphocytes Relative: 46 %
Lymphs Abs: 3.1 10*3/uL (ref 0.7–4.0)
MCH: 31.2 pg (ref 26.0–34.0)
MCHC: 32.2 g/dL (ref 30.0–36.0)
MCV: 97 fL (ref 80.0–100.0)
Monocytes Absolute: 0.4 10*3/uL (ref 0.1–1.0)
Monocytes Relative: 5 %
Neutro Abs: 3 10*3/uL (ref 1.7–7.7)
Neutrophils Relative %: 43 %
Platelets: 373 10*3/uL (ref 150–400)
RBC: 4.65 MIL/uL (ref 3.87–5.11)
RDW: 14.1 % (ref 11.5–15.5)
WBC: 6.9 10*3/uL (ref 4.0–10.5)
nRBC: 0 % (ref 0.0–0.2)

## 2019-04-05 LAB — COMPREHENSIVE METABOLIC PANEL
ALT: 13 U/L (ref 0–44)
AST: 16 U/L (ref 15–41)
Albumin: 4.2 g/dL (ref 3.5–5.0)
Alkaline Phosphatase: 61 U/L (ref 38–126)
Anion gap: 7 (ref 5–15)
BUN: 14 mg/dL (ref 8–23)
CO2: 26 mmol/L (ref 22–32)
Calcium: 9.1 mg/dL (ref 8.9–10.3)
Chloride: 103 mmol/L (ref 98–111)
Creatinine, Ser: 0.66 mg/dL (ref 0.44–1.00)
GFR calc Af Amer: >60 mL/min (ref >60)
GFR calc non Af Amer: >60 mL/min (ref >60)
Glucose, Bld: 86 mg/dL (ref 70–99)
Potassium: 3.5 mmol/L (ref 3.5–5.1)
Sodium: 136 mmol/L (ref 135–145)
Total Bilirubin: 0.6 mg/dL (ref 0.3–1.2)
Total Protein: 8.2 g/dL — ABNORMAL HIGH (ref 6.5–8.1)

## 2019-04-05 LAB — LACTATE DEHYDROGENASE: LDH: 81 U/L — ABNORMAL LOW (ref 98–192)

## 2019-04-06 ENCOUNTER — Ambulatory Visit: Payer: Medicare HMO | Attending: Internal Medicine

## 2019-04-06 DIAGNOSIS — Z23 Encounter for immunization: Secondary | ICD-10-CM

## 2019-04-06 LAB — KAPPA/LAMBDA LIGHT CHAINS
Kappa free light chain: 277 mg/L — ABNORMAL HIGH (ref 3.3–19.4)
Kappa, lambda light chain ratio: 46.17 — ABNORMAL HIGH (ref 0.26–1.65)
Lambda free light chains: 6 mg/L (ref 5.7–26.3)

## 2019-04-06 NOTE — Progress Notes (Signed)
   Covid-19 Vaccination Clinic  Name:  PIEPER ROLLING    MRN: [id number] DOB: Feb 11, 1955  04/06/2019  Ms. Reeb was observed post Covid-19 immunization for 15 minutes without incident. She was provided with Vaccine Information Sheet and instruction to access the V-Safe system.   Ms. Merkt was instructed to call 911 with any severe reactions post vaccine: Marland Kitchen Difficulty breathing  . Swelling of face and throat  . A fast heartbeat  . A bad rash all over body  . Dizziness and weakness   Immunizations Administered    Name Date Dose VIS Date Route   Moderna COVID-19 Vaccine 04/06/2019 10:09 AM 0.5 mL 12/19/2018 Intramuscular   Manufacturer: Moderna   Lot: [id number]   [id number]

## 2019-04-09 LAB — PROTEIN ELECTROPHORESIS, SERUM
A/G Ratio: 1.1 (ref 0.7–1.7)
Albumin ELP: 4 g/dL (ref 2.9–4.4)
Alpha-1-Globulin: 0.3 g/dL (ref 0.0–0.4)
Alpha-2-Globulin: 0.8 g/dL (ref 0.4–1.0)
Beta Globulin: 0.9 g/dL (ref 0.7–1.3)
Gamma Globulin: 1.9 g/dL — ABNORMAL HIGH (ref 0.4–1.8)
Globulin, Total: 3.8 g/dL (ref 2.2–3.9)
M-Spike, %: 1.6 g/dL — ABNORMAL HIGH
Total Protein ELP: 7.8 g/dL (ref 6.0–8.5)

## 2019-04-12 ENCOUNTER — Inpatient Hospital Stay (HOSPITAL_BASED_OUTPATIENT_CLINIC_OR_DEPARTMENT_OTHER): Payer: Medicare HMO | Admitting: Nurse Practitioner

## 2019-04-12 ENCOUNTER — Encounter (HOSPITAL_COMMUNITY): Payer: Self-pay | Admitting: Nurse Practitioner

## 2019-04-12 ENCOUNTER — Other Ambulatory Visit: Payer: Self-pay

## 2019-04-12 VITALS — BP 114/89 | HR 90 | Temp 96.9°F | Resp 19 | Wt 95.9 lb

## 2019-04-12 DIAGNOSIS — D472 Monoclonal gammopathy: Secondary | ICD-10-CM | POA: Diagnosis not present

## 2019-04-12 DIAGNOSIS — M810 Age-related osteoporosis without current pathological fracture: Secondary | ICD-10-CM

## 2019-04-12 DIAGNOSIS — Z79899 Other long term (current) drug therapy: Secondary | ICD-10-CM | POA: Diagnosis not present

## 2019-04-12 DIAGNOSIS — Z87891 Personal history of nicotine dependence: Secondary | ICD-10-CM | POA: Diagnosis not present

## 2019-04-12 DIAGNOSIS — M818 Other osteoporosis without current pathological fracture: Secondary | ICD-10-CM | POA: Diagnosis not present

## 2019-04-12 NOTE — Progress Notes (Signed)
[place], [place] [address]   CLINIC:  Medical Oncology/Hematology  PCP:  [facility], [place] [address] [phone]   REASON FOR VISIT: Follow-up for MGUS  CURRENT THERAPY: Observation   INTERVAL HISTORY:  Misty Richardson 64 y.o. female returns for routine follow-up MGUS.  Patient reports she is doing well since her last visit.  She has no complaints at this time.  She denies any new bone pain.  Denies any nausea, vomiting, or diarrhea. Denies any new pains. Had not noticed any recent bleeding such as epistaxis, hematuria or hematochezia. Denies recent chest pain on exertion, shortness of breath on minimal exertion, pre-syncopal episodes, or palpitations. Denies any numbness or tingling in hands or feet. Denies any recent fevers, infections, or recent hospitalizations. Patient reports appetite at 100% and energy level at 75%.  She is eating well maintain her weight at this time.    REVIEW OF SYSTEMS:  Review of Systems  Respiratory: Positive for shortness of breath (Smoker).   Neurological: Positive for headaches.  Psychiatric/Behavioral: Positive for depression and sleep disturbance. The patient is nervous/anxious.   All other systems reviewed and are negative.    PAST MEDICAL/SURGICAL HISTORY:  Past Medical History:  Diagnosis Date  . COPD (chronic obstructive pulmonary disease) ([facility])   . COPD (chronic obstructive pulmonary disease) ([facility] [facility]) 09/05/2012  . MGUS (monoclonal gammopathy of unknown significance) 09/05/2012  . Osteoporosis 03/14/2014   Past Surgical History:  Procedure Laterality Date  . ABDOMINAL HYSTERECTOMY    . APPENDECTOMY    . BONE BIOPSY Left 03/25/14  . BONE MARROW ASPIRATION Left 03/25/14  . BONE MARROW BIOPSY Left March 2016  . CESAREAN SECTION       SOCIAL HISTORY:  Social History   Socioeconomic History  . Marital status: Divorced    Spouse name: Not on file  . Number of  children: Not on file  . Years of education: Not on file  . Highest education level: Not on file  Occupational History  . Not on file  Tobacco Use  . Smoking status: Former Smoker    Packs/day: 1.00    Types: Cigarettes    Quit date: 07/11/2017    Years since quitting: 1.7  . Smokeless tobacco: Former Systems developer    Quit date: 07/31/2012  Substance and Sexual Activity  . Alcohol use: Yes    Comment: occasionally  . Drug use: No  . Sexual activity: Yes    Birth control/protection: None  Other Topics Concern  . Not on file  Social History Narrative  . Not on file   Social Determinants of Health   Financial Resource Strain:   . Difficulty of Paying Living Expenses:   Food Insecurity:   . Worried About Charity fundraiser in the Last Year:   . Arboriculturist in the Last Year:   Transportation Needs:   . Film/video editor (Medical):   Marland Kitchen Lack of Transportation (Non-Medical):   Physical Activity:   . Days of Exercise per Week:   . Minutes of Exercise per Session:   Stress:   . Feeling of Stress :   Social Connections:   . Frequency of Communication with Friends and Family:   . Frequency of Social Gatherings with Friends and Family:   . Attends Religious Services:   . Active Member of Clubs or Organizations:   . Attends Archivist Meetings:   Marland Kitchen Marital Status:   Intimate  Partner Violence:   . Fear of Current or Ex-Partner:   . Emotionally Abused:   Marland Kitchen Physically Abused:   . Sexually Abused:     FAMILY HISTORY:  Family History  Problem Relation Age of Onset  . Heart failure Father     CURRENT MEDICATIONS:  Outpatient Encounter Medications as of 04/12/2019  Medication Sig  . alendronate (FOSAMAX) 70 MG tablet TAKE ONE TABLET ('70MG'$  TOTAL) BY MOUTH ONCE A WEEK  . ALPRAZolam (XANAX) 1 MG tablet Take 1 mg by mouth at bedtime as needed.   . budesonide-formoterol (SYMBICORT) 160-4.5 MCG/ACT inhaler Inhale 2 puffs into the lungs 2 (two) times daily.  . Calcium  Carbonate-Vitamin D (CALCIUM 600+D) 600-200 MG-UNIT TABS Take 1 tablet by mouth 2 (two) times daily.  . cholecalciferol (VITAMIN D3) 25 MCG (1000 UNIT) tablet Take 1,000 Units by mouth daily.  Marland Kitchen albuterol (PROVENTIL HFA;VENTOLIN HFA) 108 (90 BASE) MCG/ACT inhaler Inhale 2 puffs into the lungs every 6 (six) hours as needed for wheezing or shortness of breath. Reported on 02/06/2015  . albuterol (PROVENTIL) (2.5 MG/3ML) 0.083% nebulizer solution Take 2.5 mg by nebulization every 6 (six) hours as needed for wheezing or shortness of breath.  Marland Kitchen ibuprofen (ADVIL,MOTRIN) 600 MG tablet Take 600 mg by mouth as needed.   . [DISCONTINUED] albuterol (ACCUNEB) 1.25 MG/3ML nebulizer solution Take 1 ampule by nebulization every 6 (six) hours as needed. Reported on 07/30/2015   No facility-administered encounter medications on file as of 04/12/2019.    ALLERGIES:  Allergies  Allergen Reactions  . Avelox [Moxifloxacin Hcl In Nacl] Rash     PHYSICAL EXAM:  ECOG Performance status: 1  Vitals:   04/12/19 1029  BP: 114/89  Pulse: 90  Resp: 19  Temp: (!) 96.9 F (36.1 C)  SpO2: 97%   Filed Weights   04/12/19 1029  Weight: 95 lb 14.4 oz (43.5 kg)    Physical Exam Constitutional:      Appearance: Normal appearance. She is normal weight.  Cardiovascular:     Rate and Rhythm: Normal rate and regular rhythm.     Heart sounds: Normal heart sounds.  Pulmonary:     Effort: Pulmonary effort is normal.     Breath sounds: Normal breath sounds.  Abdominal:     General: Bowel sounds are normal.     Palpations: Abdomen is soft.  Musculoskeletal:        General: Normal range of motion.  Skin:    General: Skin is warm.  Neurological:     Mental Status: She is alert and oriented to person, place, and time. Mental status is at baseline.  Psychiatric:        Mood and Affect: Mood normal.        Behavior: Behavior normal.        Thought Content: Thought content normal.        Judgment: Judgment normal.       LABORATORY DATA:  I have reviewed the labs as listed.  CBC    Component Value Date/Time   WBC 6.9 04/05/2019 0940   RBC 4.65 04/05/2019 0940   HGB 14.5 04/05/2019 0940   HCT 45.1 04/05/2019 0940   PLT 373 04/05/2019 0940   MCV 97.0 04/05/2019 0940   MCH 31.2 04/05/2019 0940   MCHC 32.2 04/05/2019 0940   RDW 14.1 04/05/2019 0940   LYMPHSABS 3.1 04/05/2019 0940   MONOABS 0.4 04/05/2019 0940   EOSABS 0.4 04/05/2019 0940   BASOSABS 0.1 04/05/2019 0940  CMP Latest Ref Rng & Units 04/05/2019 12/01/2018 08/01/2018  Glucose 70 - 99 mg/dL 86 89 85  BUN 8 - 23 mg/dL '14 18 14  '$ Creatinine 0.44 - 1.00 mg/dL 0.66 0.70 0.69  Sodium 135 - 145 mmol/L 136 136 137  Potassium 3.5 - 5.1 mmol/L 3.5 3.9 3.6  Chloride 98 - 111 mmol/L 103 106 107  CO2 22 - 32 mmol/L '26 23 23  '$ Calcium 8.9 - 10.3 mg/dL 9.1 9.0 8.6(L)  Total Protein 6.5 - 8.1 g/dL 8.2(H) 8.3(H) 7.5  Total Bilirubin 0.3 - 1.2 mg/dL 0.6 0.5 0.7  Alkaline Phos 38 - 126 U/L 61 70 54  AST 15 - 41 U/L '16 19 17  '$ ALT 0 - 44 U/L '13 16 12    '$ I personally performed a face-to-face visit,   All questions were answered to patient's stated satisfaction. Encouraged patient to call with any new concerns or questions before his next visit to the cancer center and we can certain see him sooner, if needed.     ASSESSMENT & PLAN:   MGUS (monoclonal gammopathy of unknown significance) 1.  IgG kappa MGUS: -Bone marrow biopsy in March 2016 showed 9% plasma cells with kappa restriction. -Skeletal survey on 07/11/2017 showed stable T7 compression deformity with no new lytic lesions. -Multiple myeloma labs done on 04/05/2019 remain stable M spike of 1.6 g/dL, kappa light chains 277, lambda light chains 6.0, ratio 46.17. -She has no evidence of endorgan disease, anemia, hypercalcemia or renal sufficiency. -We will repeat a skeletal survey prior to her next visit. -She will follow-up in 4 months with labs  2.  Osteoporosis: -Bone density on 11/23/2017  showed T score of -3.5. -She is on Fosamax 70 mg weekly. -Continues to take calcium and vitamin D supplements daily. -We will repeat bone density prior to her next visit.  3.  Smoking history: -She smoked 1 pack/day for the last 42 years. -She is still smoking however states she is actively trying to quit.  She reports using nicotine patches. -Low-dose CT scan did reveal scattered tiny lung nodules bilaterally and the largest is in the left lower lobe with the diameter 2.9 mm.  Lung RADS 2.   -We will repeat CT scan in November 2021.  4.  Health maintenance: -Last mammogram on 10/08/2018 was negative BI-RADS Category 1.       Orders placed this encounter:  Orders Placed This Encounter  Procedures  . DG Bone Density  . DG Bone Survey Met  . Lactate dehydrogenase  . Protein electrophoresis, serum  . Kappa/lambda light chains  . CBC with Differential/Platelet  . Comprehensive metabolic panel  . Vitamin B12  . VITAMIN D 25 Hydroxy (Vit-D Deficiency, Fractures)      Francene Finders, FNP-C [place] [phone]

## 2019-04-12 NOTE — Assessment & Plan Note (Signed)
1.  IgG kappa MGUS: -Bone marrow biopsy in March 2016 showed 9% plasma cells with kappa restriction. -Skeletal survey on 07/11/2017 showed stable T7 compression deformity with no new lytic lesions. -Multiple myeloma labs done on 04/05/2019 remain stable M spike of 1.6 g/dL, kappa light chains 277, lambda light chains 6.0, ratio 46.17. -She has no evidence of endorgan disease, anemia, hypercalcemia or renal sufficiency. -We will repeat a skeletal survey prior to her next visit. -She will follow-up in 4 months with labs  2.  Osteoporosis: -Bone density on 11/23/2017 showed T score of -3.5. -She is on Fosamax 70 mg weekly. -Continues to take calcium and vitamin D supplements daily. -We will repeat bone density prior to her next visit.  3.  Smoking history: -She smoked 1 pack/day for the last 42 years. -She is still smoking however states she is actively trying to quit.  She reports using nicotine patches. -Low-dose CT scan did reveal scattered tiny lung nodules bilaterally and the largest is in the left lower lobe with the diameter 2.9 mm.  Lung RADS 2.   -We will repeat CT scan in November 2021.  4.  Health maintenance: -Last mammogram on 10/08/2018 was negative BI-RADS Category 1.

## 2019-04-12 NOTE — Patient Instructions (Signed)
[place] at [facility] Discharge Instructions  Follow up in 4 months with labs and bone scan and DEXA    Thank you for choosing [place] at [facility] to provide your oncology and hematology care.  To afford each patient quality time with our provider, please arrive at least 15 minutes before your scheduled appointment time.   If you have a lab appointment with the [facility] please come in thru the Main Entrance and check in at the main information desk.  You need to re-schedule your appointment should you arrive 10 or more minutes late.  We strive to give you quality time with our providers, and arriving late affects you and other patients whose appointments are after yours.  Also, if you no show three or more times for appointments you may be dismissed from the clinic at the providers discretion.     Again, thank you for choosing [facility].  Our hope is that these requests will decrease the amount of time that you wait before being seen by our physicians.       _____________________________________________________________  Should you have questions after your visit to [facility], please contact our office at [phone] between the hours of 8:00 a.m. and 4:30 p.m.  Voicemails left after 4:00 p.m. will not be returned until the following business day.  For prescription refill requests, have your pharmacy contact our office and allow 72 hours.    Due to Covid, you will need to wear a mask upon entering the hospital. If you do not have a mask, a mask will be given to you at the Main Entrance upon arrival. For doctor visits, patients may have 1 support person with them. For treatment visits, patients can not have anyone with them due to social distancing guidelines and our immunocompromised population.

## 2019-05-08 ENCOUNTER — Ambulatory Visit: Payer: Medicare HMO | Attending: Internal Medicine

## 2019-05-08 DIAGNOSIS — Z23 Encounter for immunization: Secondary | ICD-10-CM

## 2019-05-08 NOTE — Progress Notes (Signed)
   Covid-19 Vaccination Clinic  Name:  Misty Richardson    MRN: [id number] DOB: 1955/11/27  05/08/2019  Misty Richardson was observed post Covid-19 immunization for 15 minutes without incident. She was provided with Vaccine Information Sheet and instruction to access the V-Safe system.   Misty Richardson was instructed to call 911 with any severe reactions post vaccine: Marland Kitchen Difficulty breathing  . Swelling of face and throat  . A fast heartbeat  . A bad rash all over body  . Dizziness and weakness   Immunizations Administered    Name Date Dose VIS Date Route   Moderna COVID-19 Vaccine 05/08/2019 10:54 AM 0.5 mL 12/2018 Intramuscular   Manufacturer: Moderna   Lot: [id number]   [id number]

## 2019-06-29 DIAGNOSIS — R69 Illness, unspecified: Secondary | ICD-10-CM | POA: Diagnosis not present

## 2019-07-18 DIAGNOSIS — J029 Acute pharyngitis, unspecified: Secondary | ICD-10-CM | POA: Diagnosis not present

## 2019-07-18 DIAGNOSIS — Z681 Body mass index (BMI) 19 or less, adult: Secondary | ICD-10-CM | POA: Diagnosis not present

## 2019-07-31 DIAGNOSIS — J019 Acute sinusitis, unspecified: Secondary | ICD-10-CM | POA: Diagnosis not present

## 2019-07-31 DIAGNOSIS — B379 Candidiasis, unspecified: Secondary | ICD-10-CM | POA: Diagnosis not present

## 2019-07-31 DIAGNOSIS — J111 Influenza due to unidentified influenza virus with other respiratory manifestations: Secondary | ICD-10-CM | POA: Diagnosis not present

## 2019-08-13 ENCOUNTER — Ambulatory Visit (HOSPITAL_COMMUNITY)
Admission: RE | Admit: 2019-08-13 | Discharge: 2019-08-13 | Disposition: A | Discharge status: Home / Self Care | Payer: Medicare HMO | Source: Ambulatory Visit | Attending: Nurse Practitioner | Admitting: Nurse Practitioner

## 2019-08-13 ENCOUNTER — Other Ambulatory Visit: Payer: Self-pay

## 2019-08-13 ENCOUNTER — Inpatient Hospital Stay (HOSPITAL_COMMUNITY): Payer: Medicare HMO | Attending: Hematology

## 2019-08-13 DIAGNOSIS — M810 Age-related osteoporosis without current pathological fracture: Secondary | ICD-10-CM | POA: Insufficient documentation

## 2019-08-13 DIAGNOSIS — R29890 Loss of height: Secondary | ICD-10-CM | POA: Diagnosis not present

## 2019-08-13 DIAGNOSIS — Z780 Asymptomatic menopausal state: Secondary | ICD-10-CM | POA: Diagnosis not present

## 2019-08-13 DIAGNOSIS — Z87891 Personal history of nicotine dependence: Secondary | ICD-10-CM | POA: Insufficient documentation

## 2019-08-13 DIAGNOSIS — D472 Monoclonal gammopathy: Secondary | ICD-10-CM | POA: Diagnosis not present

## 2019-08-13 LAB — CBC WITH DIFFERENTIAL/PLATELET
Abs Immature Granulocytes: 0.01 10*3/uL (ref 0.00–0.07)
Basophils Absolute: 0 10*3/uL (ref 0.0–0.1)
Basophils Relative: 1 %
Eosinophils Absolute: 0.3 10*3/uL (ref 0.0–0.5)
Eosinophils Relative: 4 %
HCT: 44.9 % (ref 36.0–46.0)
Hemoglobin: 14.6 g/dL (ref 12.0–15.0)
Immature Granulocytes: 0 %
Lymphocytes Relative: 45 %
Lymphs Abs: 3.4 10*3/uL (ref 0.7–4.0)
MCH: 31.4 pg (ref 26.0–34.0)
MCHC: 32.5 g/dL (ref 30.0–36.0)
MCV: 96.6 fL (ref 80.0–100.0)
Monocytes Absolute: 0.4 10*3/uL (ref 0.1–1.0)
Monocytes Relative: 6 %
Neutro Abs: 3.2 10*3/uL (ref 1.7–7.7)
Neutrophils Relative %: 44 %
Platelets: 365 10*3/uL (ref 150–400)
RBC: 4.65 MIL/uL (ref 3.87–5.11)
RDW: 14 % (ref 11.5–15.5)
WBC: 7.4 10*3/uL (ref 4.0–10.5)
nRBC: 0 % (ref 0.0–0.2)

## 2019-08-13 LAB — COMPREHENSIVE METABOLIC PANEL
ALT: 19 U/L (ref 0–44)
AST: 18 U/L (ref 15–41)
Albumin: 4.1 g/dL (ref 3.5–5.0)
Alkaline Phosphatase: 70 U/L (ref 38–126)
Anion gap: 9 (ref 5–15)
BUN: 14 mg/dL (ref 8–23)
CO2: 26 mmol/L (ref 22–32)
Calcium: 9.3 mg/dL (ref 8.9–10.3)
Chloride: 101 mmol/L (ref 98–111)
Creatinine, Ser: 0.7 mg/dL (ref 0.44–1.00)
GFR calc Af Amer: >60 mL/min (ref >60)
GFR calc non Af Amer: >60 mL/min (ref >60)
Glucose, Bld: 90 mg/dL (ref 70–99)
Potassium: 4.1 mmol/L (ref 3.5–5.1)
Sodium: 136 mmol/L (ref 135–145)
Total Bilirubin: 0.8 mg/dL (ref 0.3–1.2)
Total Protein: 8.4 g/dL — ABNORMAL HIGH (ref 6.5–8.1)

## 2019-08-13 LAB — LACTATE DEHYDROGENASE: LDH: 85 U/L — ABNORMAL LOW (ref 98–192)

## 2019-08-13 LAB — VITAMIN B12: Vitamin B-12: 215 pg/mL (ref 180–914)

## 2019-08-13 LAB — VITAMIN D 25 HYDROXY (VIT D DEFICIENCY, FRACTURES): Vit D, 25-Hydroxy: 20.61 ng/mL — ABNORMAL LOW (ref 30–100)

## 2019-08-14 LAB — KAPPA/LAMBDA LIGHT CHAINS
Kappa free light chain: 384.2 mg/L — ABNORMAL HIGH (ref 3.3–19.4)
Kappa, lambda light chain ratio: 48.03 — ABNORMAL HIGH (ref 0.26–1.65)
Lambda free light chains: 8 mg/L (ref 5.7–26.3)

## 2019-08-15 LAB — PROTEIN ELECTROPHORESIS, SERUM
A/G Ratio: 1 (ref 0.7–1.7)
Albumin ELP: 3.8 g/dL (ref 2.9–4.4)
Alpha-1-Globulin: 0.3 g/dL (ref 0.0–0.4)
Alpha-2-Globulin: 0.8 g/dL (ref 0.4–1.0)
Beta Globulin: 0.9 g/dL (ref 0.7–1.3)
Gamma Globulin: 2 g/dL — ABNORMAL HIGH (ref 0.4–1.8)
Globulin, Total: 4 g/dL — ABNORMAL HIGH (ref 2.2–3.9)
M-Spike, %: 1.8 g/dL — ABNORMAL HIGH
Total Protein ELP: 7.8 g/dL (ref 6.0–8.5)

## 2019-08-20 ENCOUNTER — Ambulatory Visit (HOSPITAL_COMMUNITY): Payer: Medicare HMO | Admitting: Hematology

## 2019-08-27 ENCOUNTER — Other Ambulatory Visit: Payer: Self-pay

## 2019-08-27 ENCOUNTER — Inpatient Hospital Stay (HOSPITAL_COMMUNITY): Payer: Medicare HMO | Attending: Hematology | Admitting: Hematology

## 2019-08-27 VITALS — BP 126/78 | HR 76 | Temp 97.1°F | Resp 17 | Wt 90.2 lb

## 2019-08-27 DIAGNOSIS — Z87891 Personal history of nicotine dependence: Secondary | ICD-10-CM | POA: Diagnosis not present

## 2019-08-27 DIAGNOSIS — C9000 Multiple myeloma not having achieved remission: Secondary | ICD-10-CM | POA: Diagnosis not present

## 2019-08-27 DIAGNOSIS — J449 Chronic obstructive pulmonary disease, unspecified: Secondary | ICD-10-CM | POA: Insufficient documentation

## 2019-08-27 DIAGNOSIS — Z79899 Other long term (current) drug therapy: Secondary | ICD-10-CM | POA: Diagnosis not present

## 2019-08-27 DIAGNOSIS — D472 Monoclonal gammopathy: Secondary | ICD-10-CM | POA: Diagnosis not present

## 2019-08-27 DIAGNOSIS — M818 Other osteoporosis without current pathological fracture: Secondary | ICD-10-CM | POA: Diagnosis not present

## 2019-08-27 NOTE — Patient Instructions (Addendum)
Misty Richardson at [facility] Discharge Instructions  You were seen today by Dr. Delton Coombes. He went over your recent results. Take 2,000 units of vitamin D daily for your osteoporosis. Increase your caloric intake to improve your weight. You will be scheduled for an x-ray scan of your bones. Dr. Delton Coombes will see you back in 4 months for labs and follow up.   Thank you for choosing [place] at [facility] to provide your oncology and hematology care.  To afford each patient quality time with our provider, please arrive at least 15 minutes before your scheduled appointment time.   If you have a lab appointment with the [facility] please come in thru the Main Entrance and check in at the main information desk  You need to re-schedule your appointment should you arrive 10 or more minutes late.  We strive to give you quality time with our providers, and arriving late affects you and other patients whose appointments are after yours.  Also, if you no show three or more times for appointments you may be dismissed from the clinic at the providers discretion.     Again, thank you for choosing [facility].  Our hope is that these requests will decrease the amount of time that you wait before being seen by our physicians.       _____________________________________________________________  Should you have questions after your visit to [facility], please contact our office at [phone] between the hours of 8:00 a.m. and 4:30 p.m.  Voicemails left after 4:00 p.m. will not be returned until the following business day.  For prescription refill requests, have your pharmacy contact our office and allow 72 hours.    Cancer Center Support Programs:   > Cancer Support Group  2nd Tuesday of the month 1pm-2pm, Journey Room

## 2019-08-27 NOTE — Progress Notes (Signed)
[facility], [place] [address]   CLINIC:  Medical Oncology/Hematology  PCP:  [facility], [place] / [place] [address]  [phone]  REASON FOR VISIT:  Follow-up for MGUS  PRIOR THERAPY: None  CURRENT THERAPY: Observation  INTERVAL HISTORY:  Misty Richardson, a 65 y.o. female, returns for routine follow-up for her MGUS. Misty Richardson was last seen on 08/08/2018.  Today she reports that her weight fluctuates naturally and she denies trying to lose weight. Her appetite is good but her smoking blunts her appetite. She takes vitamin D, but not on a daily basis. She takes Fosamax weekly since 11/28/2018. She denies any bone pains, including jaw pain.  She tried to quit smoking many years ago and she notes that her weight was around 100 lbs. Her mammogram is coming up in 11/2019.   REVIEW OF SYSTEMS:  Review of Systems  Constitutional: Negative for appetite change and fatigue.  Respiratory: Positive for cough (d/t smoking) and shortness of breath (w/ anxiety).   Musculoskeletal: Negative for arthralgias.  Psychiatric/Behavioral: Positive for sleep disturbance.  All other systems reviewed and are negative.   PAST MEDICAL/SURGICAL HISTORY:  Past Medical History:  Diagnosis Date  . COPD (chronic obstructive pulmonary disease) ([facility])   . COPD (chronic obstructive pulmonary disease) ([facility]) 09/05/2012  . MGUS (monoclonal gammopathy of unknown significance) 09/05/2012  . Osteoporosis 03/14/2014   Past Surgical History:  Procedure Laterality Date  . ABDOMINAL HYSTERECTOMY    . APPENDECTOMY    . BONE BIOPSY Left 03/25/14  . BONE MARROW ASPIRATION Left 03/25/14  . BONE MARROW BIOPSY Left March 2016  . CESAREAN SECTION      SOCIAL HISTORY:  Social History   Socioeconomic History  . Marital status: Divorced    Spouse name: Not on file  . Number of children: Not on file  . Years of education: Not on file  . Highest education  level: Not on file  Occupational History  . Not on file  Tobacco Use  . Smoking status: Former Smoker    Packs/day: 1.00    Types: Cigarettes    Quit date: 07/11/2017    Years since quitting: 2.1  . Smokeless tobacco: Former Systems developer    Quit date: 07/31/2012  Vaping Use  . Vaping Use: Never used  Substance and Sexual Activity  . Alcohol use: Yes    Comment: occasionally  . Drug use: No  . Sexual activity: Yes    Birth control/protection: None  Other Topics Concern  . Not on file  Social History Narrative  . Not on file   Social Determinants of Health   Financial Resource Strain:   . Difficulty of Paying Living Expenses:   Food Insecurity:   . Worried About Charity fundraiser in the Last Year:   . Arboriculturist in the Last Year:   Transportation Needs:   . Film/video editor (Medical):   Marland Kitchen Lack of Transportation (Non-Medical):   Physical Activity:   . Days of Exercise per Week:   . Minutes of Exercise per Session:   Stress:   . Feeling of Stress :   Social Connections:   . Frequency of Communication with Friends and Family:   . Frequency of Social Gatherings with Friends and Family:   . Attends Religious Services:   . Active Member of Clubs or Organizations:   . Attends Archivist Meetings:   Marland Kitchen Marital Status:  Intimate Partner Violence:   . Fear of Current or Ex-Partner:   . Emotionally Abused:   Marland Kitchen Physically Abused:   . Sexually Abused:     FAMILY HISTORY:  Family History  Problem Relation Age of Onset  . Heart failure Father     CURRENT MEDICATIONS:  Current Outpatient Medications  Medication Sig Dispense Refill  . albuterol (PROVENTIL HFA;VENTOLIN HFA) 108 (90 BASE) MCG/ACT inhaler Inhale 2 puffs into the lungs every 6 (six) hours as needed for wheezing or shortness of breath. Reported on 02/06/2015    . albuterol (PROVENTIL) (2.5 MG/3ML) 0.083% nebulizer solution Take 2.5 mg by nebulization every 6 (six) hours as needed for wheezing or  shortness of breath.    Marland Kitchen alendronate (FOSAMAX) 70 MG tablet TAKE ONE TABLET ('70MG'$  TOTAL) BY MOUTH ONCE A WEEK 12 tablet 1  . ALPRAZolam (XANAX) 1 MG tablet Take 1 mg by mouth at bedtime as needed.     . budesonide-formoterol (SYMBICORT) 160-4.5 MCG/ACT inhaler Inhale 2 puffs into the lungs 2 (two) times daily. 1 Inhaler 1  . Calcium Carbonate-Vitamin D (CALCIUM 600+D) 600-200 MG-UNIT TABS Take 1 tablet by mouth 2 (two) times daily.    . cholecalciferol (VITAMIN D3) 25 MCG (1000 UNIT) tablet Take 1,000 Units by mouth daily.    Marland Kitchen ibuprofen (ADVIL,MOTRIN) 600 MG tablet Take 600 mg by mouth as needed.      No current facility-administered medications for this visit.    ALLERGIES:  Allergies  Allergen Reactions  . Avelox [Moxifloxacin Hcl In Nacl] Rash    PHYSICAL EXAM:  Performance status (ECOG): 1 - Symptomatic but completely ambulatory  Vitals:   08/27/19 1418  BP: 126/78  Pulse: 76  Resp: 17  Temp: (!) 97.1 F (36.2 C)  SpO2: 94%   Wt Readings from Last 3 Encounters:  08/27/19 90 lb 3.2 oz (40.9 kg)  04/12/19 95 lb 14.4 oz (43.5 kg)  12/08/18 96 lb 12.8 oz (43.9 kg)   Physical Exam Vitals reviewed.  Constitutional:      Appearance: Normal appearance.  Neurological:     Mental Status: She is alert and oriented to person, place, and time.  Psychiatric:        Mood and Affect: Mood normal.        Behavior: Behavior normal.     LABORATORY DATA:  I have reviewed the labs as listed.  CBC Latest Ref Rng & Units 08/13/2019 04/05/2019 12/01/2018  WBC 4.0 - 10.5 K/uL 7.4 6.9 6.4  Hemoglobin 12.0 - 15.0 g/dL 14.6 14.5 14.0  Hematocrit 36 - 46 % 44.9 45.1 43.5  Platelets 150 - 400 K/uL 365 373 348   CMP Latest Ref Rng & Units 08/13/2019 04/05/2019 12/01/2018  Glucose 70 - 99 mg/dL 90 86 89  BUN 8 - 23 mg/dL '14 14 18  '$ Creatinine 0.44 - 1.00 mg/dL 0.70 0.66 0.70  Sodium 135 - 145 mmol/L 136 136 136  Potassium 3.5 - 5.1 mmol/L 4.1 3.5 3.9  Chloride 98 - 111 mmol/L 101 103 106   CO2 22 - 32 mmol/L '26 26 23  '$ Calcium 8.9 - 10.3 mg/dL 9.3 9.1 9.0  Total Protein 6.5 - 8.1 g/dL 8.4(H) 8.2(H) 8.3(H)  Total Bilirubin 0.3 - 1.2 mg/dL 0.8 0.6 0.5  Alkaline Phos 38 - 126 U/L 70 61 70  AST 15 - 41 U/L '18 16 19  '$ ALT 0 - 44 U/L '19 13 16      '$ Component Value Date/Time   RBC 4.65 08/13/2019  0943   MCV 96.6 08/13/2019 0943   MCH 31.4 08/13/2019 0943   MCHC 32.5 08/13/2019 0943   RDW 14.0 08/13/2019 0943   LYMPHSABS 3.4 08/13/2019 0943   MONOABS 0.4 08/13/2019 0943   EOSABS 0.3 08/13/2019 0943   BASOSABS 0.0 08/13/2019 0943   Lab Results  Component Value Date   VD25OH 20.61 (L) 08/13/2019   VD25OH 31.6 02/06/2015   Lab Results  Component Value Date   LDH 85 (L) 08/13/2019   LDH 81 (L) 04/05/2019   LDH 85 (L) 12/01/2018   Lab Results  Component Value Date   TOTALPROTELP 7.8 08/13/2019   ALBUMINELP 3.8 08/13/2019   A1GS 0.3 08/13/2019   A2GS 0.8 08/13/2019   BETS 0.9 08/13/2019   BETA2SER 3.1 (L) 03/01/2014   GAMS 2.0 (H) 08/13/2019   MSPIKE 1.8 (H) 08/13/2019   SPEI Comment 08/13/2019    Lab Results  Component Value Date   KPAFRELGTCHN 384.2 (H) 08/13/2019   LAMBDASER 8.0 08/13/2019   KAPLAMBRATIO 48.03 (H) 08/13/2019    DIAGNOSTIC IMAGING:  I have independently reviewed the scans and discussed with the patient. DG Bone Density  Result Date: 08/13/2019 EXAM: DUAL X-RAY ABSORPTIOMETRY (DXA) FOR BONE MINERAL DENSITY IMPRESSION: Your patient Misty Richardson completed a BMD test on 08/13/2019 using the [place] (software version: 14.10) manufactured by [employer]. The following summarizes the results of our evaluation. Technologist::TNB PATIENT BIOGRAPHICAL: Name: Misty Richardson, Misty Richardson Patient ID: [id number] Birth Date: 02/14/1955 Height: 62.0 in. Gender: Female Exam Date: 08/13/2019 Weight: 95.9 lbs. Indications: Caucasian, Follow up Osteoporosis, Height Loss, History of Fracture (Adult), Low Body Weight, Low Calcium Intake,  Partial Hysterectomy, Post Menopausal, Tobacco User Fractures: Ankle Treatments: Vitamin D, Fosamax, Calcium DENSITOMETRY RESULTS: Site          Region     Measured Date Measured Age WHO Classification Young Adult T-score BMD         %Change vs. Previous Significant Change (*) DualFemur Neck Left 08/13/2019 63.5 Osteoporosis -3.4 0.563 g/cm2 -8.2% Yes DualFemur Neck Left 11/23/2017 61.8 Osteoporosis -3.1 0.613 g/cm2 11.7% Yes DualFemur Neck Left 02/01/2014 58.0 Osteoporosis -3.5 0.549 g/cm2 -14.1% Yes DualFemur Neck Left 08/28/2008 52.6 Osteoporosis -2.9 0.639 g/cm2 - - DualFemur Total Mean 08/13/2019 63.5 Osteoporosis -3.2 0.604 g/cm2 1.3% - DualFemur Total Mean 11/23/2017 61.8 - - 0.596 g/cm2 -0.3% - DualFemur Total Mean 02/01/2014 58.0 Osteoporosis -3.2 0.598 g/cm2 -11.8% Yes DualFemur Total Mean 08/28/2008 52.6 Osteoporosis -2.6 0.678 g/cm2 - - Right Forearm Radius 33% 08/13/2019 63.5 Osteoporosis -2.6 0.528 g/cm2 -4.9% - Right Forearm Radius 33% 11/23/2017 61.8 Osteopenia -2.2 0.555 g/cm2 -18.2% Yes Right Forearm Radius 33% 08/28/2008 52.6 Normal -0.5 0.679 g/cm2 - - ASSESSMENT: The BMD measured at Femur Neck Left is 0.563 g/cm2 with a T-score of -3.4. This patient is considered osteoporotic according to [facility]) criteria. The scan quality is good. Compared with the prior study on 11/23/2017, the BMD of the left femoral neck shows a statistically significant decrease. Compared with the prior study on 11/23/2017, the BMD of the total mean and right forearm shows no statistically significant change. Lumbar spine was excluded due to advanced degenerative changes. Patient is not a candidate for FRAX assessment due to diagnosis of osteoporosis. World Pharmacologist [facility]) criteria for post-menopausal, Caucasian Women: Normal:       T-score at or above -1 SD Osteopenia:   T-score between -1 and -2.5 SD Osteoporosis: T-score at or below -2.5 SD RECOMMENDATIONS: 1. All patients  should optimize  calcium and vitamin D intake. 2. Consider FDA-approved medical therapies in postmenopausal women and med aged 66 years and older, based on the following: a. A hip or vertebral (clinical or morphometric) fracture b. T-score< -2.5 at the femoral neck or spine after appropriate evaluation to exclude secondary causes c. Low bone mass (T-score between -1.0 and -2.5 at the femoral neck or spine) and a 10-year probability of a hip fracture > 3% or a 10-year probability of a major osteoporosis-related fracture > 20% based on the [place]-adapted WHO algorithm d. Clinician judgment and/or patient preferences may indicate treatment for people with 10-year fracture probabilities above or below these levels FOLLOW-UP: People with diagnosed cases of osteoporosis or at high risk for fracture should have regular bone mineral density tests. For patients eligible for Medicare, routine testing is allowed once every 2 years. The testing frequency can be increased to one year for patients who have rapidly progressing disease, those who are receiving or discontinuing medical therapy to restore bone mass, or have additional risk factors. I have reviewed this report, and agree with the above findings. [facility] Electronically Signed   By: Rolm Baptise M.D.   On: 08/13/2019 11:50     ASSESSMENT:  1.  IgG kappa MGUS: - Bone marrow biopsy in March 2016 shows 9% plasma cells with kappa restriction. -Skeletal survey from 07/11/2017 shows stable T7 compression deformity with no new lytic lesions. - We reviewed blood work from 08/01/2018.  M spike is 1.6.  Creatinine is 0.69.  Calcium is 8.6.  Hemoglobin is 14. - Free light chain ratio has increased to 79.44 from 50.53.  Kappa light chains were 309.  Lambda light chain was 3.9. -In view of the changes in the free light chain ratio, I have recommended repeating scans in 4 months.  2.  Smoking history: -She smoked 1 pack/day for the past 42 years. -CT lung on 12/01/2018 was  lung RADS 2.  3.  Osteoporosis: -Bone density test on 08/13/2019 shows T score -3.4. -She is taking Fosamax weekly.  4.  Health maintenance: -Mammogram on 11/28/2018 was BI-RADS Category 1.   PLAN:  1.  IgG kappa MGUS: -Reviewed myeloma labs from 08/13/2019.  M spike is 1.8 g, slightly up from 1.6 previously. -Kappa light chains increased to 384, but ratio is stable around 48.  Calcium was 9.3 with a normal creatinine.  Hemoglobin was normal. -As there is sudden increase in kappa light chains, I have recommended follow-up in 4 months.  I plan to repeat skeletal survey prior to next visit along with myeloma labs.  2.  Smoking history: -She will have another annual CT lung cancer screening scan in November.  3.  Osteoporosis: -We reviewed results of the bone density.  She was told to take Fosamax on a regular basis as she is missing doses.  We also talked about options of Prolia.  We will continue Fosamax at this time.  She was also instructed to take calcium and vitamin D.  4.  Health maintenance: -She will have mammograms done in November.   Orders placed this encounter:  No orders of the defined types were placed in this encounter.    Derek Jack, MD [place] [phone]   I, Milinda Antis, am acting as a scribe for Dr. Sanda Linger.  I, Derek Jack MD, have reviewed the above documentation for accuracy and completeness, and I agree with the above.

## 2019-08-30 ENCOUNTER — Other Ambulatory Visit (HOSPITAL_COMMUNITY): Payer: Self-pay | Admitting: Hematology

## 2019-08-30 DIAGNOSIS — M810 Age-related osteoporosis without current pathological fracture: Secondary | ICD-10-CM

## 2019-09-28 DIAGNOSIS — R69 Illness, unspecified: Secondary | ICD-10-CM | POA: Diagnosis not present

## 2019-09-28 DIAGNOSIS — J441 Chronic obstructive pulmonary disease with (acute) exacerbation: Secondary | ICD-10-CM | POA: Diagnosis not present

## 2019-09-28 DIAGNOSIS — R0600 Dyspnea, unspecified: Secondary | ICD-10-CM | POA: Diagnosis not present

## 2019-09-29 DIAGNOSIS — R69 Illness, unspecified: Secondary | ICD-10-CM | POA: Diagnosis not present

## 2019-10-03 DIAGNOSIS — J449 Chronic obstructive pulmonary disease, unspecified: Secondary | ICD-10-CM | POA: Diagnosis not present

## 2019-10-03 DIAGNOSIS — G894 Chronic pain syndrome: Secondary | ICD-10-CM | POA: Diagnosis not present

## 2019-10-03 DIAGNOSIS — M1991 Primary osteoarthritis, unspecified site: Secondary | ICD-10-CM | POA: Diagnosis not present

## 2019-11-05 DIAGNOSIS — Z23 Encounter for immunization: Secondary | ICD-10-CM | POA: Diagnosis not present

## 2019-11-06 ENCOUNTER — Other Ambulatory Visit (HOSPITAL_COMMUNITY): Payer: Self-pay

## 2019-11-06 DIAGNOSIS — Z87891 Personal history of nicotine dependence: Secondary | ICD-10-CM

## 2019-11-06 DIAGNOSIS — Z122 Encounter for screening for malignant neoplasm of respiratory organs: Secondary | ICD-10-CM

## 2019-11-06 NOTE — Progress Notes (Signed)
LDCT scheduled for 12/03/2019 at 3p, patient aware.

## 2019-12-03 ENCOUNTER — Other Ambulatory Visit (HOSPITAL_COMMUNITY): Payer: Self-pay | Admitting: Internal Medicine

## 2019-12-03 ENCOUNTER — Ambulatory Visit (HOSPITAL_COMMUNITY)
Admission: RE | Admit: 2019-12-03 | Discharge: 2019-12-03 | Disposition: A | Discharge status: Home / Self Care | Payer: Medicare HMO | Source: Ambulatory Visit | Attending: Oncology | Admitting: Oncology

## 2019-12-03 ENCOUNTER — Other Ambulatory Visit: Payer: Self-pay

## 2019-12-03 DIAGNOSIS — R69 Illness, unspecified: Secondary | ICD-10-CM | POA: Diagnosis not present

## 2019-12-03 DIAGNOSIS — Z122 Encounter for screening for malignant neoplasm of respiratory organs: Secondary | ICD-10-CM | POA: Diagnosis not present

## 2019-12-03 DIAGNOSIS — Z87891 Personal history of nicotine dependence: Secondary | ICD-10-CM | POA: Insufficient documentation

## 2019-12-03 DIAGNOSIS — Z1231 Encounter for screening mammogram for malignant neoplasm of breast: Secondary | ICD-10-CM

## 2019-12-06 ENCOUNTER — Encounter (HOSPITAL_COMMUNITY): Payer: Self-pay

## 2019-12-06 NOTE — Progress Notes (Signed)
Patient notified of LDCT Lung Cancer Screening Results via mail with the recommendation to follow-up in 12 months. Patient's PCP has been sent a copy of results. Results are as follows:  IMPRESSION: 1. Lung-RADS 2S, benign appearance or behavior. Continue annual screening with low-dose chest CT without contrast in 12 months. 2. The "S" modifier above refers to potentially clinically significant non lung cancer related findings. Specifically, there is aortic atherosclerosis, in addition to left main coronary artery disease. Please note that although the presence of coronary artery calcium documents the presence of coronary artery disease, the severity of this disease and any potential stenosis cannot be assessed on this non-gated CT examination. Assessment for potential risk factor modification, dietary therapy or pharmacologic therapy may be warranted, if clinically indicated. 3. Mild diffuse bronchial wall thickening with mild centrilobular and paraseptal emphysema; imaging findings suggestive of underlying COPD.  Aortic Atherosclerosis (ICD10-I70.0) and Emphysema (ICD10-J43.9).

## 2019-12-25 ENCOUNTER — Other Ambulatory Visit: Payer: Self-pay

## 2019-12-25 ENCOUNTER — Inpatient Hospital Stay (HOSPITAL_COMMUNITY): Payer: Medicare HMO | Attending: Hematology and Oncology

## 2019-12-25 ENCOUNTER — Ambulatory Visit (HOSPITAL_COMMUNITY)
Admission: RE | Admit: 2019-12-25 | Discharge: 2019-12-25 | Disposition: A | Discharge status: Home / Self Care | Payer: Medicare HMO | Source: Ambulatory Visit | Attending: Hematology | Admitting: Hematology

## 2019-12-25 DIAGNOSIS — Z79899 Other long term (current) drug therapy: Secondary | ICD-10-CM | POA: Diagnosis not present

## 2019-12-25 DIAGNOSIS — R69 Illness, unspecified: Secondary | ICD-10-CM | POA: Diagnosis not present

## 2019-12-25 DIAGNOSIS — D472 Monoclonal gammopathy: Secondary | ICD-10-CM

## 2019-12-25 DIAGNOSIS — C9000 Multiple myeloma not having achieved remission: Secondary | ICD-10-CM | POA: Diagnosis present

## 2019-12-25 DIAGNOSIS — F17210 Nicotine dependence, cigarettes, uncomplicated: Secondary | ICD-10-CM | POA: Insufficient documentation

## 2019-12-25 LAB — CBC WITH DIFFERENTIAL/PLATELET
Abs Immature Granulocytes: 0.01 10*3/uL (ref 0.00–0.07)
Basophils Absolute: 0.1 10*3/uL (ref 0.0–0.1)
Basophils Relative: 1 %
Eosinophils Absolute: 0.4 10*3/uL (ref 0.0–0.5)
Eosinophils Relative: 6 %
HCT: 42.4 % (ref 36.0–46.0)
Hemoglobin: 14.1 g/dL (ref 12.0–15.0)
Immature Granulocytes: 0 %
Lymphocytes Relative: 38 %
Lymphs Abs: 2.3 10*3/uL (ref 0.7–4.0)
MCH: 32.1 pg (ref 26.0–34.0)
MCHC: 33.3 g/dL (ref 30.0–36.0)
MCV: 96.6 fL (ref 80.0–100.0)
Monocytes Absolute: 0.5 10*3/uL (ref 0.1–1.0)
Monocytes Relative: 8 %
Neutro Abs: 2.9 10*3/uL (ref 1.7–7.7)
Neutrophils Relative %: 47 %
Platelets: 321 10*3/uL (ref 150–400)
RBC: 4.39 MIL/uL (ref 3.87–5.11)
RDW: 12.8 % (ref 11.5–15.5)
WBC: 6.2 10*3/uL (ref 4.0–10.5)
nRBC: 0 % (ref 0.0–0.2)

## 2019-12-25 LAB — COMPREHENSIVE METABOLIC PANEL
ALT: 18 U/L (ref 0–44)
AST: 21 U/L (ref 15–41)
Albumin: 4 g/dL (ref 3.5–5.0)
Alkaline Phosphatase: 62 U/L (ref 38–126)
Anion gap: 7 (ref 5–15)
BUN: 11 mg/dL (ref 8–23)
CO2: 23 mmol/L (ref 22–32)
Calcium: 9.1 mg/dL (ref 8.9–10.3)
Chloride: 106 mmol/L (ref 98–111)
Creatinine, Ser: 0.64 mg/dL (ref 0.44–1.00)
GFR, Estimated: >60 mL/min (ref >60)
Glucose, Bld: 122 mg/dL — ABNORMAL HIGH (ref 70–99)
Potassium: 3.4 mmol/L — ABNORMAL LOW (ref 3.5–5.1)
Sodium: 136 mmol/L (ref 135–145)
Total Bilirubin: 0.5 mg/dL (ref 0.3–1.2)
Total Protein: 8 g/dL (ref 6.5–8.1)

## 2019-12-25 LAB — VITAMIN D 25 HYDROXY (VIT D DEFICIENCY, FRACTURES): Vit D, 25-Hydroxy: 55.41 ng/mL (ref 30–100)

## 2019-12-25 LAB — VITAMIN B12: Vitamin B-12: 184 pg/mL (ref 180–914)

## 2019-12-25 LAB — LACTATE DEHYDROGENASE: LDH: 85 U/L — ABNORMAL LOW (ref 98–192)

## 2019-12-26 LAB — KAPPA/LAMBDA LIGHT CHAINS
Kappa free light chain: 338.7 mg/L — ABNORMAL HIGH (ref 3.3–19.4)
Kappa, lambda light chain ratio: 53.76 — ABNORMAL HIGH (ref 0.26–1.65)
Lambda free light chains: 6.3 mg/L (ref 5.7–26.3)

## 2019-12-26 LAB — PROTEIN ELECTROPHORESIS, SERUM
A/G Ratio: 1.1 (ref 0.7–1.7)
Albumin ELP: 3.9 g/dL (ref 2.9–4.4)
Alpha-1-Globulin: 0.2 g/dL (ref 0.0–0.4)
Alpha-2-Globulin: 0.6 g/dL (ref 0.4–1.0)
Beta Globulin: 0.8 g/dL (ref 0.7–1.3)
Gamma Globulin: 1.9 g/dL — ABNORMAL HIGH (ref 0.4–1.8)
Globulin, Total: 3.5 g/dL (ref 2.2–3.9)
M-Spike, %: 1.8 g/dL — ABNORMAL HIGH
Total Protein ELP: 7.4 g/dL (ref 6.0–8.5)

## 2020-01-01 ENCOUNTER — Ambulatory Visit (HOSPITAL_COMMUNITY): Payer: Medicare HMO | Admitting: Oncology

## 2020-01-08 NOTE — Progress Notes (Signed)
[place] [place], [place] [address]   CLINIC:  Medical Oncology/Hematology  PCP:  [facility], [place] / [place] [address]  [phone]  REASON FOR VISIT:  Follow-up for MGUS  PRIOR THERAPY: None  CURRENT THERAPY: Observation  INTERVAL HISTORY:  Misty Richardson is a 64 y.o. female with a diagnosis of MGUS.  She presents to the clinic today for routine follow-up having last been seen on 08/27/2019 when she saw Dr. Delton Coombes.  Since her last visit she was seen in the emergency room on 09/28/2019 for exacerbation of her COPD.  The patient's recent labs from 12/25/2019 returned with a vitamin D level of 55.41, free kappa light chain of 338.7, free lambda light chain of 6.3, and kappa, lambda light chain ratio of 53.76.  The kappa, lambda light chain ratio was higher then on 08/13/2019 when it returned at 48.03.  An M spike was noted which was stable at 1.8.  A vitamin B12 level returned at 184 with the patient's chemistry panel showing normal values except for a potassium of 3.4 and a glucose of 122.  A CBC returned with a WBC of 6.2, hemoglobin 14.1, hematocrit 42.4 and platelet count of 321.  And LDH returned at 69.  She reports that she has symptoms of a sinus infection.  She declines any antibiotics but plans to begin an over-the-counter agent.  She reports that her appetite is at 100%, energy level at 50% and that she has no pain.  REVIEW OF SYSTEMS:   Review of Systems  Constitutional: Positive for malaise/fatigue. Negative for chills, diaphoresis and fever.  HENT: Positive for congestion. Negative for sinus pain and sore throat.   Eyes: Negative.   Respiratory: Negative for cough, hemoptysis, sputum production, shortness of breath and wheezing.   Cardiovascular: Negative for chest pain, palpitations, orthopnea, claudication and leg swelling.  Gastrointestinal: Negative for abdominal pain, blood in stool, constipation,  diarrhea, heartburn, melena, nausea and vomiting.  Genitourinary: Negative.   Musculoskeletal: Negative for back pain, joint pain, myalgias and neck pain.  Skin: Negative.   Neurological: Negative for dizziness, tingling and headaches.  Endo/Heme/Allergies: Negative.   Psychiatric/Behavioral: Negative.      PAST MEDICAL/SURGICAL HISTORY:  Past Medical History:  Diagnosis Date  . COPD (chronic obstructive pulmonary disease) ([place])   . COPD (chronic obstructive pulmonary disease) ([facility]) 09/05/2012  . MGUS (monoclonal gammopathy of unknown significance) 09/05/2012  . Osteoporosis 03/14/2014   Past Surgical History:  Procedure Laterality Date  . ABDOMINAL HYSTERECTOMY    . APPENDECTOMY    . BONE BIOPSY Left 03/25/14  . BONE MARROW ASPIRATION Left 03/25/14  . BONE MARROW BIOPSY Left March 2016  . CESAREAN SECTION      SOCIAL HISTORY:  Social History   Socioeconomic History  . Marital status: Divorced    Spouse name: Not on file  . Number of children: Not on file  . Years of education: Not on file  . Highest education level: Not on file  Occupational History  . Not on file  Tobacco Use  . Smoking status: Former Smoker    Packs/day: 1.00    Types: Cigarettes    Quit date: 07/11/2017    Years since quitting: 2.4  . Smokeless tobacco: Former Systems developer    Quit date: 07/31/2012  Vaping Use  . Vaping Use: Never used  Substance and Sexual Activity  . Alcohol use: Yes    Comment: occasionally  . Drug use:  No  . Sexual activity: Yes    Birth control/protection: None  Other Topics Concern  . Not on file  Social History Narrative  . Not on file   Social Determinants of Health   Financial Resource Strain: Not on file  Food Insecurity: Not on file  Transportation Needs: Not on file  Physical Activity: Not on file  Stress: Not on file  Social Connections: Not on file  Intimate Partner Violence: Not on file    FAMILY HISTORY:  Family History  Problem Relation Age of Onset  .  Heart failure Father     CURRENT MEDICATIONS:  Current Outpatient Medications  Medication Sig Dispense Refill  . albuterol (PROVENTIL HFA;VENTOLIN HFA) 108 (90 BASE) MCG/ACT inhaler Inhale 2 puffs into the lungs every 6 (six) hours as needed for wheezing or shortness of breath. Reported on 02/06/2015    . albuterol (PROVENTIL) (2.5 MG/3ML) 0.083% nebulizer solution Take 2.5 mg by nebulization every 6 (six) hours as needed for wheezing or shortness of breath.    Marland Kitchen alendronate (FOSAMAX) 70 MG tablet TAKE ONE TABLET (70MG TOTAL) BY MOUTH ONCE A WEEK 12 tablet 1  . ALPRAZolam (XANAX) 1 MG tablet Take 1 mg by mouth at bedtime as needed.     . budesonide-formoterol (SYMBICORT) 160-4.5 MCG/ACT inhaler Inhale 2 puffs into the lungs 2 (two) times daily. 1 Inhaler 1  . Calcium Carbonate-Vitamin D (CALCIUM 600+D) 600-200 MG-UNIT TABS Take 1 tablet by mouth 2 (two) times daily.    . cholecalciferol (VITAMIN D3) 25 MCG (1000 UNIT) tablet Take 1,000 Units by mouth daily.    Marland Kitchen ibuprofen (ADVIL,MOTRIN) 600 MG tablet Take 600 mg by mouth as needed.      No current facility-administered medications for this visit.    ALLERGIES:  Allergies  Allergen Reactions  . Avelox [Moxifloxacin Hcl In Nacl] Rash    PHYSICAL EXAM:  Performance status (ECOG): 1 - Symptomatic but completely ambulatory  There were no vitals filed for this visit. Wt Readings from Last 3 Encounters:  08/27/19 90 lb 3.2 oz (40.9 kg)  04/12/19 95 lb 14.4 oz (43.5 kg)  12/08/18 96 lb 12.8 oz (43.9 kg)   Physical Exam Constitutional:      General: She is not in acute distress.    Appearance: She is not ill-appearing, toxic-appearing or diaphoretic.  HENT:     Head: Normocephalic and atraumatic.     Nose:     Right Sinus: No maxillary sinus tenderness or frontal sinus tenderness.     Left Sinus: No maxillary sinus tenderness or frontal sinus tenderness.  Eyes:     General: No scleral icterus.       Right eye: No discharge.         Left eye: No discharge.     Conjunctiva/sclera: Conjunctivae normal.  Cardiovascular:     Rate and Rhythm: Normal rate and regular rhythm.     Heart sounds: No murmur heard. No friction rub. No gallop.   Pulmonary:     Effort: Pulmonary effort is normal. No respiratory distress.     Breath sounds: Normal breath sounds. No wheezing, rhonchi or rales.  Abdominal:     General: Abdomen is flat. Bowel sounds are normal. There is no distension.     Palpations: Abdomen is soft.     Tenderness: There is no abdominal tenderness. There is no guarding.  Musculoskeletal:     Right lower leg: No edema.     Left lower leg: No edema.  Skin:    General: Skin is warm and dry.     Coloration: Skin is not jaundiced or pale.     Findings: No erythema or rash.  Neurological:     Mental Status: She is alert.     Coordination: Coordination normal.     Gait: Gait normal.  Psychiatric:        Mood and Affect: Mood normal.        Behavior: Behavior normal.        Thought Content: Thought content normal.        Judgment: Judgment normal.     LABORATORY DATA:  I have reviewed the labs as listed.  CBC Latest Ref Rng & Units 12/25/2019 08/13/2019 04/05/2019  WBC 4.0 - 10.5 K/uL 6.2 7.4 6.9  Hemoglobin 12.0 - 15.0 g/dL 14.1 14.6 14.5  Hematocrit 36.0 - 46.0 % 42.4 44.9 45.1  Platelets 150 - 400 K/uL 321 365 373   CMP Latest Ref Rng & Units 12/25/2019 08/13/2019 04/05/2019  Glucose 70 - 99 mg/dL 122(H) 90 86  BUN 8 - 23 mg/dL _0 Creatinine 0.44 - 1.00 mg/dL 0.64 0.70 0.66  Sodium 135 - 145 mmol/L 136 136 136  Potassium 3.5 - 5.1 mmol/L 3.4(L) 4.1 3.5  Chloride 98 - 111 mmol/L 106 101 103  CO2 22 - 32 mmol/L _1 Calcium 8.9 - 10.3 mg/dL 9.1 9.3 9.1  Total Protein 6.5 - 8.1 g/dL 8.0 8.4(H) 8.2(H)  Total Bilirubin 0.3 - 1.2 mg/dL 0.5 0.8 0.6  Alkaline Phos 38 - 126 U/L 62 70 61  AST 15 - 41 U/L _2 ALT 0 - 44 U/L _3 Component Value Date/Time   RBC 4.39 12/25/2019 1324    MCV 96.6 12/25/2019 1324   MCH 32.1 12/25/2019 1324   MCHC 33.3 12/25/2019 1324   RDW 12.8 12/25/2019 1324   LYMPHSABS 2.3 12/25/2019 1324   MONOABS 0.5 12/25/2019 1324   EOSABS 0.4 12/25/2019 1324   BASOSABS 0.1 12/25/2019 1324   Lab Results  Component Value Date   VD25OH 55.41 12/25/2019   VD25OH 20.61 (L) 08/13/2019   VD25OH 31.6 02/06/2015   Lab Results  Component Value Date   LDH 85 (L) 12/25/2019   LDH 85 (L) 08/13/2019   LDH 81 (L) 04/05/2019   Lab Results  Component Value Date   TOTALPROTELP 7.4 12/25/2019   ALBUMINELP 3.9 12/25/2019   A1GS 0.2 12/25/2019   A2GS 0.6 12/25/2019   BETS 0.8 12/25/2019   BETA2SER 3.1 (L) 03/01/2014   GAMS 1.9 (H) 12/25/2019   MSPIKE 1.8 (H) 12/25/2019   SPEI Comment 12/25/2019    Lab Results  Component Value Date   KPAFRELGTCHN 338.7 (H) 12/25/2019   LAMBDASER 6.3 12/25/2019   KAPLAMBRATIO 53.76 (H) 12/25/2019    DIAGNOSTIC IMAGING:  I have independently reviewed the scans and discussed with the patient. DG Bone Survey Met  Result Date: 12/26/2019 CLINICAL DATA:  Monoclonal gammopathy unknown significance. EXAM: METASTATIC BONE SURVEY COMPARISON:  Metastatic bone survey 03/31/2018 FINDINGS: No lytic lesions are seen compatible with myeloma. Chronic compression fracture T7 vertebral body unchanged. No other fracture identified. IMPRESSION: No evidence of multiple myeloma. Electronically Signed   By: Franchot Gallo M.D.   On: 12/26/2019 15:18     ASSESSMENT:  1.  IgG kappa MGUS: - Bone marrow biopsy in March 2016 shows 9% plasma cells with kappa restriction. -Skeletal survey from 07/11/2017 shows stable  T7 compression deformity with no new lytic lesions. - Labs from 12/25/2019 returned showing an M spike of 1.8.  Creatinine 0.64, calcium 9.1, and hemoglobin of 14.1.  - Free light chain ratio has increased to 53.76 from 48.03.  Kappa light chains were 338.7. Lambda light chain was 6.3. -A skeletal survey from 12/25/2019 shows no  evidence of multiple myeloma.  2.  Smoking history: -She smoked 1 pack/day for the past 42 years. -CT lung on 11/24/2019 returned showing a Lung-RADS 2S  3.  Osteoporosis: -Bone density test on 08/13/2019 shows T score -3.4. -She is taking Fosamax weekly and also continue to take calcium and vitamin D.  4.  Health maintenance: -Mammogram on 11/28/2018 was BI-RADS Category 1.   PLAN:  1.  IgG kappa MGUS: -Reviewed myeloma labs from 08/13/2019.  M spike is 1.8 g, slightly up from 1.6 previously. -Kappa light chains increased to 384, but ratio is stable around 48.  Calcium was 9.3 with a normal creatinine.  Hemoglobin was normal. -Skeletal survey from 12/25/2019 shows no evidence of multiple myeloma.  2.  Smoking history: -A CT scan lung cancer screening completed on 11/24/2019 returned showing a Lung-RADS 2S, benign appearance or behavior.  With recommendation for continued continue annual screening with low-dose chest CT without contrast in 12 months.  Additionally the CT scan showedmi ild diffuse bronchial wall thickening with mild centrilobular and paraseptal emphysema; imaging findings suggestive of underlying COPD.  3.  Osteoporosis: -A bone density scan from 08/13/2019 return showing a T-score of -3.4.  She was told to continue Fosamax on a regular basis and also continue to take calcium and vitamin D.  4.  Health maintenance: -A mammogram was completes today.   Orders placed this encounter:  No orders of the defined types were placed in this encounter.    Sandi Mealy, MHS, PA-C Physician Assistant

## 2020-01-09 ENCOUNTER — Ambulatory Visit (HOSPITAL_COMMUNITY): Payer: Medicare HMO

## 2020-01-09 ENCOUNTER — Inpatient Hospital Stay (HOSPITAL_BASED_OUTPATIENT_CLINIC_OR_DEPARTMENT_OTHER): Payer: Medicare HMO | Admitting: Medical

## 2020-01-09 ENCOUNTER — Other Ambulatory Visit: Payer: Self-pay

## 2020-01-09 ENCOUNTER — Ambulatory Visit (HOSPITAL_COMMUNITY)
Admission: RE | Admit: 2020-01-09 | Discharge: 2020-01-09 | Disposition: A | Discharge status: Home / Self Care | Payer: Medicare HMO | Source: Ambulatory Visit | Attending: Internal Medicine | Admitting: Internal Medicine

## 2020-01-09 VITALS — BP 121/72 | HR 78 | Temp 96.8°F | Resp 16 | Wt 94.5 lb

## 2020-01-09 DIAGNOSIS — M818 Other osteoporosis without current pathological fracture: Secondary | ICD-10-CM

## 2020-01-09 DIAGNOSIS — C9000 Multiple myeloma not having achieved remission: Secondary | ICD-10-CM | POA: Diagnosis not present

## 2020-01-09 DIAGNOSIS — Z1231 Encounter for screening mammogram for malignant neoplasm of breast: Secondary | ICD-10-CM

## 2020-01-09 DIAGNOSIS — D472 Monoclonal gammopathy: Secondary | ICD-10-CM

## 2020-01-31 ENCOUNTER — Other Ambulatory Visit (HOSPITAL_COMMUNITY): Payer: Self-pay | Admitting: *Deleted

## 2020-01-31 DIAGNOSIS — D472 Monoclonal gammopathy: Secondary | ICD-10-CM

## 2020-02-07 DIAGNOSIS — Z1389 Encounter for screening for other disorder: Secondary | ICD-10-CM | POA: Diagnosis not present

## 2020-02-07 DIAGNOSIS — Z681 Body mass index (BMI) 19 or less, adult: Secondary | ICD-10-CM | POA: Diagnosis not present

## 2020-02-07 DIAGNOSIS — R69 Illness, unspecified: Secondary | ICD-10-CM | POA: Diagnosis not present

## 2020-02-07 DIAGNOSIS — I700 Atherosclerosis of aorta: Secondary | ICD-10-CM | POA: Diagnosis not present

## 2020-02-16 DIAGNOSIS — R0602 Shortness of breath: Secondary | ICD-10-CM | POA: Diagnosis not present

## 2020-02-16 DIAGNOSIS — R062 Wheezing: Secondary | ICD-10-CM | POA: Diagnosis not present

## 2020-02-16 DIAGNOSIS — R069 Unspecified abnormalities of breathing: Secondary | ICD-10-CM | POA: Diagnosis not present

## 2020-02-16 DIAGNOSIS — R0902 Hypoxemia: Secondary | ICD-10-CM | POA: Diagnosis not present

## 2020-02-17 ENCOUNTER — Encounter: Payer: Self-pay | Admitting: Emergency Medicine

## 2020-02-17 ENCOUNTER — Ambulatory Visit
Admission: EM | Admit: 2020-02-17 | Discharge: 2020-02-17 | Disposition: A | Discharge status: Home / Self Care | Payer: Medicare HMO | Attending: Emergency Medicine | Admitting: Emergency Medicine

## 2020-02-17 ENCOUNTER — Other Ambulatory Visit: Payer: Self-pay

## 2020-02-17 DIAGNOSIS — J441 Chronic obstructive pulmonary disease with (acute) exacerbation: Secondary | ICD-10-CM

## 2020-02-17 MED ORDER — PREDNISONE 10 MG (21) PO TBPK: ORAL_TABLET | Freq: Every day | ORAL | 0 refills | Status: DC

## 2020-02-17 MED ORDER — AZITHROMYCIN 250 MG PO TABS: 250.0000 mg | ORAL_TABLET | Freq: Every day | ORAL | 0 refills | Status: DC

## 2020-02-17 MED ORDER — DEXAMETHASONE SODIUM PHOSPHATE 10 MG/ML IJ SOLN
10.0000 mg | Freq: Once | INTRAMUSCULAR | Status: AC
  Administered 2020-02-17: 10 mg via INTRAMUSCULAR

## 2020-02-17 MED ORDER — BENZONATATE 100 MG PO CAPS: 100.0000 mg | ORAL_CAPSULE | Freq: Three times a day (TID) | ORAL | 0 refills | Status: DC

## 2020-02-17 NOTE — ED Triage Notes (Signed)
Pt states she is having a copd flare. Pt reports coughing.  Has been doing doing breathing tx at home with some relief.

## 2020-02-17 NOTE — Discharge Instructions (Addendum)
Get plenty of rest and push fluids Tessalon Perles prescribed for cough Decadron IM was given in office  Prednisone was prescribed Azithromycin was prescribed Continue to take albuterol as prescribed and directed Follow-up with PCP for further reevaluation Use medications daily for symptom relief Use OTC medications like ibuprofen or tylenol as needed fever or pain Call or go to the ED if you have any new or worsening symptoms such as fever, worsening cough, shortness of breath, chest tightness, chest pain, turning blue, changes in mental status, etc..Marland Kitchen

## 2020-02-17 NOTE — ED Provider Notes (Signed)
[facility]   [id number] 02/17/20 Arrival Time: 0916   Chief Complaint  Patient presents with  . COPD     SUBJECTIVE: History from: patient.  Misty Richardson is a 65 y.o. female who presented to the urgent care with a complaint of COPD flareup for the past few days.  She is complaining of cough with yellowish-brown sputum.  Denies sick exposure to COVID, flu or strep.  Denies recent travel.  Has tried breathing treatment with mild relief.  Denies alleviating or aggravating factors.  Denies previous symptoms in the past.   Denies fever, chills, fatigue, sinus pain, rhinorrhea, sore throat, SOB, wheezing, chest pain, nausea, changes in bowel or bladder habits.     ROS: As per HPI.  All other pertinent ROS negative.      Past Medical History:  Diagnosis Date  . COPD (chronic obstructive pulmonary disease) ([place])   . COPD (chronic obstructive pulmonary disease) ([place]) 09/05/2012  . MGUS (monoclonal gammopathy of unknown significance) 09/05/2012  . Osteoporosis 03/14/2014   Past Surgical History:  Procedure Laterality Date  . ABDOMINAL HYSTERECTOMY    . APPENDECTOMY    . BONE BIOPSY Left 03/25/14  . BONE MARROW ASPIRATION Left 03/25/14  . BONE MARROW BIOPSY Left March 2016  . CESAREAN SECTION     Allergies  Allergen Reactions  . Avelox [Moxifloxacin Hcl In Nacl] Rash   No current facility-administered medications on file prior to encounter.   Current Outpatient Medications on File Prior to Encounter  Medication Sig Dispense Refill  . albuterol (PROVENTIL HFA;VENTOLIN HFA) 108 (90 BASE) MCG/ACT inhaler Inhale 2 puffs into the lungs every 6 (six) hours as needed for wheezing or shortness of breath. Reported on 02/06/2015 (Patient not taking: Reported on 01/09/2020)    . albuterol (PROVENTIL) (2.5 MG/3ML) 0.083% nebulizer solution Take 2.5 mg by nebulization every 6 (six) hours as needed for wheezing or shortness of breath. (Patient not taking: Reported on 01/09/2020)    .  alendronate (FOSAMAX) 70 MG tablet TAKE ONE TABLET (70MG TOTAL) BY MOUTH ONCE A WEEK 12 tablet 1  . ALPRAZolam (XANAX) 1 MG tablet Take 1 mg by mouth at bedtime as needed.  (Patient not taking: Reported on 01/09/2020)    . budesonide-formoterol (SYMBICORT) 160-4.5 MCG/ACT inhaler Inhale 2 puffs into the lungs 2 (two) times daily. 1 Inhaler 1  . Calcium Carbonate-Vitamin D 600-200 MG-UNIT TABS Take 1 tablet by mouth 2 (two) times daily.    . cholecalciferol (VITAMIN D3) 25 MCG (1000 UNIT) tablet Take 1,000 Units by mouth daily.    Marland Kitchen ibuprofen (ADVIL,MOTRIN) 600 MG tablet Take 600 mg by mouth as needed.  (Patient not taking: Reported on 01/09/2020)     Social History   Socioeconomic History  . Marital status: Divorced    Spouse name: Not on file  . Number of children: Not on file  . Years of education: Not on file  . Highest education level: Not on file  Occupational History  . Not on file  Tobacco Use  . Smoking status: Former Smoker    Packs/day: 1.00    Types: Cigarettes    Quit date: 07/11/2017    Years since quitting: 2.6  . Smokeless tobacco: Former Systems developer    Quit date: 07/31/2012  Vaping Use  . Vaping Use: Never used  Substance and Sexual Activity  . Alcohol use: Yes    Comment: occasionally  . Drug use: No  . Sexual activity: Yes    Birth control/protection: None  Other Topics Concern  . Not on file  Social History Narrative  . Not on file   Social Determinants of Health   Financial Resource Strain: Not on file  Food Insecurity: Not on file  Transportation Needs: Not on file  Physical Activity: Not on file  Stress: Not on file  Social Connections: Not on file  Intimate Partner Violence: Not on file   Family History  Problem Relation Age of Onset  . Heart failure Father     OBJECTIVE:  Vitals:   02/17/20 0947  BP: 120/79  Pulse: 86  Resp: (!) 22  Temp: 97.8 F (36.6 C)  TempSrc: Oral  SpO2: (!) 88%     General appearance: alert; appears fatigued, but  nontoxic; speaking in full sentences and tolerating own secretions HEENT: NCAT; Ears: EACs clear, TMs pearly gray; Eyes: PERRL.  EOM grossly intact. Sinuses: nontender; Nose: nares patent without rhinorrhea, Throat: oropharynx clear, tonsils non erythematous or enlarged, uvula midline  Neck: supple without LAD Lungs: unlabored respirations, symmetrical air entry; cough: moderate; no respiratory distress; CTAB Heart: regular rate and rhythm.  Radial pulses 2+ symmetrical bilaterally Skin: warm and dry Psychological: alert and cooperative; normal mood and affect  LABS:  No results found for this or any previous visit (from the past 24 hour(s)).   ASSESSMENT & PLAN:  1. COPD exacerbation ([place])     Meds ordered this encounter  Medications  . predniSONE (STERAPRED UNI-PAK 21 TAB) 10 MG (21) TBPK tablet    Sig: Take by mouth daily. Take 6 tabs by mouth daily  for 2 days, then 5 tabs for 2 days, then 4 tabs for 2 days, then 3 tabs for 2 days, 2 tabs for 2 days, then 1 tab by mouth daily for 2 days    Dispense:  42 tablet    Refill:  0  . azithromycin (ZITHROMAX) 250 MG tablet    Sig: Take 1 tablet (250 mg total) by mouth daily. Take first 2 tablets together, then 1 every day until finished.    Dispense:  6 tablet    Refill:  0  . benzonatate (TESSALON) 100 MG capsule    Sig: Take 1 capsule (100 mg total) by mouth every 8 (eight) hours.    Dispense:  21 capsule    Refill:  0  . dexamethasone (DECADRON) injection 10 mg    Discharge Instructions   Reviewed expectations re: course of current medical issues. Questions answered. Outlined signs and symptoms indicating need for more acute intervention. Patient verbalized understanding. After Visit Summary given.         Emerson Monte, FNP 02/17/20 1013

## 2020-02-20 ENCOUNTER — Ambulatory Visit (HOSPITAL_COMMUNITY)
Admission: RE | Admit: 2020-02-20 | Discharge: 2020-02-20 | Disposition: A | Discharge status: Home / Self Care | Payer: Medicare HMO | Source: Ambulatory Visit | Attending: Internal Medicine | Admitting: Internal Medicine

## 2020-02-20 ENCOUNTER — Other Ambulatory Visit: Payer: Self-pay

## 2020-02-20 DIAGNOSIS — Z1231 Encounter for screening mammogram for malignant neoplasm of breast: Secondary | ICD-10-CM | POA: Insufficient documentation

## 2020-03-07 ENCOUNTER — Other Ambulatory Visit (HOSPITAL_COMMUNITY): Payer: Self-pay | Admitting: Hematology

## 2020-03-07 DIAGNOSIS — M810 Age-related osteoporosis without current pathological fracture: Secondary | ICD-10-CM

## 2020-03-19 DIAGNOSIS — Z1331 Encounter for screening for depression: Secondary | ICD-10-CM | POA: Diagnosis not present

## 2020-03-19 DIAGNOSIS — Z0000 Encounter for general adult medical examination without abnormal findings: Secondary | ICD-10-CM | POA: Diagnosis not present

## 2020-03-19 DIAGNOSIS — Z681 Body mass index (BMI) 19 or less, adult: Secondary | ICD-10-CM | POA: Diagnosis not present

## 2020-03-26 ENCOUNTER — Encounter: Payer: Self-pay | Admitting: Cardiology

## 2020-03-26 NOTE — Progress Notes (Signed)
Cardiology Office Note  Date: 03/27/2020   ID: Misty Richardson 12/18/1955, MRN [id number]  PCP:  Redmond School, MD  Cardiologist:  Rozann Lesches, MD Electrophysiologist:  None   Chief Complaint  Patient presents with  . Coronary artery calcification by CT    History of Present Illness: Misty Richardson is a 65 y.o. female referred for cardiology consultation by Mr. Eston Mould with [name] due to finding of coronary artery calcification by CT imaging back in November 2021.  Study was obtained for lung cancer screening.  She is here today with her mother.  Reports no angina symptoms but chronic dyspnea on exertion in the setting of COPD.  She uses her inhalers on a daily basis.  She states that she is disabled, lives at home, functional with ADLs but does have to pace herself.  She continues to follow in the hematology clinic with history of MGUS.  I did review her recent lab work.  I personally reviewed her ECG today which is normal.  Plan to request most recent lipid panel from [facility].  She does not report any history of hyperlipidemia.  Past Medical History:  Diagnosis Date  . COPD (chronic obstructive pulmonary disease) ([place])   . MGUS (monoclonal gammopathy of unknown significance)   . Osteoporosis     Past Surgical History:  Procedure Laterality Date  . ABDOMINAL HYSTERECTOMY    . APPENDECTOMY    . BONE BIOPSY Left 03/25/14  . BONE MARROW ASPIRATION Left 03/25/14  . BONE MARROW BIOPSY Left March 2016  . CESAREAN SECTION      Current Outpatient Medications  Medication Sig Dispense Refill  . albuterol (PROVENTIL HFA;VENTOLIN HFA) 108 (90 BASE) MCG/ACT inhaler Inhale 2 puffs into the lungs every 6 (six) hours as needed for wheezing or shortness of breath. Reported on 02/06/2015    . albuterol (PROVENTIL) (2.5 MG/3ML) 0.083% nebulizer solution Take 2.5 mg by nebulization every 6 (six) hours as needed for wheezing or shortness of breath.    Marland Kitchen alendronate (FOSAMAX)  70 MG tablet TAKE ONE TABLET ($RemoveBef'70MG'IYrUIbCCOo$  TOTAL) BY MOUTH ONCE A WEEK 12 tablet 1  . ALPRAZolam (XANAX) 1 MG tablet Take 1 mg by mouth at bedtime as needed.    Marland Kitchen aspirin EC 81 MG tablet Take 1 tablet (81 mg total) by mouth daily. Swallow whole. 90 tablet 3  . budesonide-formoterol (SYMBICORT) 160-4.5 MCG/ACT inhaler Inhale 2 puffs into the lungs 2 (two) times daily. 1 Inhaler 1  . Calcium Carbonate-Vitamin D 600-200 MG-UNIT TABS Take 1 tablet by mouth 2 (two) times daily.    . cholecalciferol (VITAMIN D3) 25 MCG (1000 UNIT) tablet Take 1,000 Units by mouth daily.    Marland Kitchen ibuprofen (ADVIL,MOTRIN) 600 MG tablet Take 600 mg by mouth as needed.     No current facility-administered medications for this visit.   Allergies:  Avelox [moxifloxacin hcl in nacl]   Social History: The patient  reports that she has been smoking cigarettes. She has been smoking about 0.50 packs per day. She quit smokeless tobacco use about 7 years ago. She reports current alcohol use. She reports that she does not use drugs.   Family History: The patient's family history includes Arthritis in her mother; Heart failure in her father.   ROS: No palpitations or syncope.  Physical Exam: VS:  BP 110/78   Pulse 95   Ht $R'5\' 1"'fI$  (1.549 m)   Wt 96 lb 9.6 oz (43.8 kg)   SpO2 95%  BMI 18.25 kg/m , BMI Body mass index is 18.25 kg/m.  Wt Readings from Last 3 Encounters:  03/27/20 96 lb 9.6 oz (43.8 kg)  01/09/20 94 lb 8 oz (42.9 kg)  08/27/19 90 lb 3.2 oz (40.9 kg)    General: Thin woman, no distress. HEENT: Conjunctiva and lids normal, wearing a mask. Neck: Supple, no elevated JVP or carotid bruits, no thyromegaly. Lungs: Breath sounds without wheezing, nonlabored breathing at rest. Cardiac: Regular rate and rhythm, no S3 or significant systolic murmur, no pericardial rub. Abdomen: Soft, nontender, bowel sounds present. Extremities: No pitting edema, distal pulses 2+. Skin: Warm and dry. Musculoskeletal: No  kyphosis. Neuropsychiatric: Alert and oriented x3, affect grossly appropriate.  ECG:  An ECG dated 12/03/2012 was personally reviewed today and demonstrated:  Normal sinus rhythm with decreased R wave progression.  Recent Labwork: 12/25/2019: ALT 18; AST 21; BUN 11; Creatinine, Ser 0.64; Hemoglobin 14.1; Platelets 321; Potassium 3.4; Sodium 136   Other Studies Reviewed Today:  Chest CT 12/03/2019: IMPRESSION: 1. Lung-RADS 2S, benign appearance or behavior. Continue annual screening with low-dose chest CT without contrast in 12 months. 2. The "S" modifier above refers to potentially clinically significant non lung cancer related findings. Specifically, there is aortic atherosclerosis, in addition to left main coronary artery disease. Please note that although the presence of coronary artery calcium documents the presence of coronary artery disease, the severity of this disease and any potential stenosis cannot be assessed on this non-gated CT examination. Assessment for potential risk factor modification, dietary therapy or pharmacologic therapy may be warranted, if clinically indicated. 3. Mild diffuse bronchial wall thickening with mild centrilobular and paraseptal emphysema; imaging findings suggestive of underlying COPD.  Aortic Atherosclerosis (ICD10-I70.0) and Emphysema (ICD10-J43.9).  Assessment and Plan:  1.  Multivessel coronary artery calcification as well as aortic atherosclerosis incidentally noted by CT imaging in November 2021.  Baseline ECG is normal and she reports no angina symptoms.  Dyspnea on exertion more chronic in the setting of COPD.  We discussed these findings today and implications.  I recommended that she start aspirin 81 mg daily.  Also requesting lipid panel from [facility] with eye toward statin therapy.  We will arrange routine follow-up over time.  I did explain that if exertional symptomatology develops over baseline, further ischemic testing would be  necessary.  2.  COPD in the setting of tobacco use history.  Medication Adjustments/Labs and Tests Ordered: Current medicines are reviewed at length with the patient today.  Concerns regarding medicines are outlined above.   Tests Ordered: No orders of the defined types were placed in this encounter.   Medication Changes: Meds ordered this encounter  Medications  . aspirin EC 81 MG tablet    Sig: Take 1 tablet (81 mg total) by mouth daily. Swallow whole.    Dispense:  90 tablet    Refill:  3    Disposition:  Follow up 1 year in the [place] office, sooner if needed.  Signed, Satira Sark, MD, [facility] 03/27/2020 3:11 PM    [place] [facility] [address], [place], [place] [address] Phone: [phone]; Fax: [phone]

## 2020-03-27 ENCOUNTER — Other Ambulatory Visit: Payer: Self-pay

## 2020-03-27 ENCOUNTER — Encounter: Payer: Self-pay | Admitting: *Deleted

## 2020-03-27 ENCOUNTER — Ambulatory Visit: Payer: Medicare HMO | Admitting: Cardiology

## 2020-03-27 ENCOUNTER — Encounter: Payer: Self-pay | Admitting: Cardiology

## 2020-03-27 VITALS — BP 110/78 | HR 95 | Ht 61.0 in | Wt 96.6 lb

## 2020-03-27 DIAGNOSIS — I2510 Atherosclerotic heart disease of native coronary artery without angina pectoris: Secondary | ICD-10-CM | POA: Diagnosis not present

## 2020-03-27 DIAGNOSIS — J449 Chronic obstructive pulmonary disease, unspecified: Secondary | ICD-10-CM | POA: Diagnosis not present

## 2020-03-27 DIAGNOSIS — I700 Atherosclerosis of aorta: Secondary | ICD-10-CM | POA: Diagnosis not present

## 2020-03-27 MED ORDER — ASPIRIN EC 81 MG PO TBEC: 81.0000 mg | DELAYED_RELEASE_TABLET | Freq: Every day | ORAL | 3 refills | Status: AC

## 2020-03-27 NOTE — Patient Instructions (Signed)
Medication Instructions:  Your physician recommends that you continue on your current medications as directed. Please refer to the Current Medication list given to you today.  Start Aspirin 81 mg Daily   *If you need a refill on your cardiac medications before your next appointment, please call your pharmacy*   Lab Work: NONE   If you have labs (blood work) drawn today and your tests are completely normal, you will receive your results only by: Marland Kitchen MyChart Message (if you have MyChart) OR . A paper copy in the mail If you have any lab test that is abnormal or we need to change your treatment, we will call you to review the results.   Testing/Procedures: NONE   Follow-Up: At [facility], you and your health needs are our priority.  As part of our continuing mission to provide you with exceptional heart care, we have created designated Provider Care Teams.  These Care Teams include your primary Cardiologist (physician) and Advanced Practice Providers (APPs -  Physician Assistants and Nurse Practitioners) who all work together to provide you with the care you need, when you need it.  We recommend signing up for the patient portal called "MyChart".  Sign up information is provided on this After Visit Summary.  MyChart is used to connect with patients for Virtual Visits (Telemedicine).  Patients are able to view lab/test results, encounter notes, upcoming appointments, etc.  Non-urgent messages can be sent to your provider as well.   To learn more about what you can do with MyChart, go to [email or web address].    Your next appointment:   1 year(s)  The format for your next appointment:   In Person  Provider:   You may see Rozann Lesches, MD or one of the following Advanced Practice Providers on your designated Care Team:    Bernerd Pho, PA-C   Ermalinda Barrios, PA-C     Other Instructions Thank you for choosing [facility]!

## 2020-03-27 NOTE — Addendum Note (Signed)
Addended by: Levonne Hubert on: 03/27/2020 04:20 PM   Modules accepted: Orders

## 2020-04-04 ENCOUNTER — Telehealth: Payer: Self-pay

## 2020-04-04 DIAGNOSIS — I2510 Atherosclerotic heart disease of native coronary artery without angina pectoris: Secondary | ICD-10-CM

## 2020-04-04 MED ORDER — ROSUVASTATIN CALCIUM 5 MG PO TABS: 5.0000 mg | ORAL_TABLET | Freq: Every day | ORAL | 3 refills | Status: DC

## 2020-04-04 NOTE — Telephone Encounter (Signed)
-----   Message from Satira Sark, MD sent at 04/04/2020 12:44 PM EDT ----- Results reviewed.  Please let her know that I reviewed lab work from [place] done last year.  Her LDL cholesterol was 121 at that time.  In light of coronary artery calcification by CT imaging we should start her on statin therapy for further risk reduction.  See if she would be willing to start Crestor 5 mg daily.  We would plan to repeat a fasting lipid profile and LFTs in 3 months if she tolerates.

## 2020-04-04 NOTE — Telephone Encounter (Signed)
Pt will start crestor 5 mg daily, I mailed lab slip

## 2020-04-16 DIAGNOSIS — I2510 Atherosclerotic heart disease of native coronary artery without angina pectoris: Secondary | ICD-10-CM | POA: Diagnosis not present

## 2020-04-16 DIAGNOSIS — J449 Chronic obstructive pulmonary disease, unspecified: Secondary | ICD-10-CM | POA: Diagnosis not present

## 2020-05-26 ENCOUNTER — Inpatient Hospital Stay (HOSPITAL_COMMUNITY): Payer: Medicare HMO | Attending: Hematology

## 2020-05-26 ENCOUNTER — Other Ambulatory Visit: Payer: Self-pay

## 2020-05-26 DIAGNOSIS — J449 Chronic obstructive pulmonary disease, unspecified: Secondary | ICD-10-CM | POA: Diagnosis not present

## 2020-05-26 DIAGNOSIS — F17210 Nicotine dependence, cigarettes, uncomplicated: Secondary | ICD-10-CM | POA: Diagnosis not present

## 2020-05-26 DIAGNOSIS — D472 Monoclonal gammopathy: Secondary | ICD-10-CM

## 2020-05-26 DIAGNOSIS — R69 Illness, unspecified: Secondary | ICD-10-CM | POA: Diagnosis not present

## 2020-05-26 LAB — CBC WITH DIFFERENTIAL/PLATELET
Abs Immature Granulocytes: 0.01 10*3/uL (ref 0.00–0.07)
Basophils Absolute: 0.1 10*3/uL (ref 0.0–0.1)
Basophils Relative: 1 %
Eosinophils Absolute: 0.4 10*3/uL (ref 0.0–0.5)
Eosinophils Relative: 5 %
HCT: 45.9 % (ref 36.0–46.0)
Hemoglobin: 15.1 g/dL — ABNORMAL HIGH (ref 12.0–15.0)
Immature Granulocytes: 0 %
Lymphocytes Relative: 38 %
Lymphs Abs: 2.9 10*3/uL (ref 0.7–4.0)
MCH: 31.6 pg (ref 26.0–34.0)
MCHC: 32.9 g/dL (ref 30.0–36.0)
MCV: 96 fL (ref 80.0–100.0)
Monocytes Absolute: 0.6 10*3/uL (ref 0.1–1.0)
Monocytes Relative: 7 %
Neutro Abs: 3.8 10*3/uL (ref 1.7–7.7)
Neutrophils Relative %: 49 %
Platelets: 359 10*3/uL (ref 150–400)
RBC: 4.78 MIL/uL (ref 3.87–5.11)
RDW: 13.2 % (ref 11.5–15.5)
WBC: 7.8 10*3/uL (ref 4.0–10.5)
nRBC: 0 % (ref 0.0–0.2)

## 2020-05-26 LAB — COMPREHENSIVE METABOLIC PANEL
ALT: 22 U/L (ref 0–44)
AST: 21 U/L (ref 15–41)
Albumin: 4.4 g/dL (ref 3.5–5.0)
Alkaline Phosphatase: 69 U/L (ref 38–126)
Anion gap: 8 (ref 5–15)
BUN: 11 mg/dL (ref 8–23)
CO2: 25 mmol/L (ref 22–32)
Calcium: 8.9 mg/dL (ref 8.9–10.3)
Chloride: 101 mmol/L (ref 98–111)
Creatinine, Ser: 0.75 mg/dL (ref 0.44–1.00)
GFR, Estimated: >60 mL/min (ref >60)
Glucose, Bld: 85 mg/dL (ref 70–99)
Potassium: 3.8 mmol/L (ref 3.5–5.1)
Sodium: 134 mmol/L — ABNORMAL LOW (ref 135–145)
Total Bilirubin: 0.7 mg/dL (ref 0.3–1.2)
Total Protein: 8.7 g/dL — ABNORMAL HIGH (ref 6.5–8.1)

## 2020-05-26 LAB — VITAMIN B12: Vitamin B-12: 240 pg/mL (ref 180–914)

## 2020-05-26 LAB — LACTATE DEHYDROGENASE: LDH: 92 U/L — ABNORMAL LOW (ref 98–192)

## 2020-05-26 LAB — VITAMIN D 25 HYDROXY (VIT D DEFICIENCY, FRACTURES): Vit D, 25-Hydroxy: 63.59 ng/mL (ref 30–100)

## 2020-05-27 LAB — KAPPA/LAMBDA LIGHT CHAINS
Kappa free light chain: 275.7 mg/L — ABNORMAL HIGH (ref 3.3–19.4)
Kappa, lambda light chain ratio: 64.12 — ABNORMAL HIGH (ref 0.26–1.65)
Lambda free light chains: 4.3 mg/L — ABNORMAL LOW (ref 5.7–26.3)

## 2020-05-28 LAB — PROTEIN ELECTROPHORESIS, SERUM
A/G Ratio: 1.1 (ref 0.7–1.7)
Albumin ELP: 4.1 g/dL (ref 2.9–4.4)
Alpha-1-Globulin: 0.2 g/dL (ref 0.0–0.4)
Alpha-2-Globulin: 0.8 g/dL (ref 0.4–1.0)
Beta Globulin: 0.8 g/dL (ref 0.7–1.3)
Gamma Globulin: 2.1 g/dL — ABNORMAL HIGH (ref 0.4–1.8)
Globulin, Total: 3.9 g/dL (ref 2.2–3.9)
M-Spike, %: 1.9 g/dL — ABNORMAL HIGH
Total Protein ELP: 8 g/dL (ref 6.0–8.5)

## 2020-06-03 ENCOUNTER — Inpatient Hospital Stay (HOSPITAL_COMMUNITY): Payer: Medicare HMO | Admitting: Hematology

## 2020-06-03 ENCOUNTER — Other Ambulatory Visit: Payer: Self-pay

## 2020-06-03 VITALS — BP 133/70 | HR 77 | Temp 97.1°F | Resp 17 | Wt 96.1 lb

## 2020-06-03 DIAGNOSIS — D472 Monoclonal gammopathy: Secondary | ICD-10-CM | POA: Diagnosis not present

## 2020-06-03 DIAGNOSIS — R69 Illness, unspecified: Secondary | ICD-10-CM | POA: Diagnosis not present

## 2020-06-03 DIAGNOSIS — M818 Other osteoporosis without current pathological fracture: Secondary | ICD-10-CM

## 2020-06-03 DIAGNOSIS — J449 Chronic obstructive pulmonary disease, unspecified: Secondary | ICD-10-CM | POA: Diagnosis not present

## 2020-06-03 NOTE — Progress Notes (Signed)
[address] [place], [place] [address]   CLINIC:  Medical Oncology/Hematology  PCP:  Redmond School, [place] / [place] [address]  [phone]  REASON FOR VISIT:  Follow-up for MGUS  PRIOR THERAPY: none  CURRENT THERAPY: observation  INTERVAL HISTORY:  [name], a 65 y.o. female, returns for routine follow-up for her MGUS. Misty Richardson was last seen on 01/09/2020. She is taking vitamin D 4000 units daily; 2 tablets daily. She reports chronic pain in left ankle but denies any new pains in her joints. The pain in her left ankle is accompanied by swelling that radiates up to her knee. She denies any infections of fevers within the last 3-4 months. She reports SOB while walking, but it is at baseline; she has COPD and is a smoker.  REVIEW OF SYSTEMS:  Review of Systems  Constitutional: Positive for fatigue (80%). Negative for appetite change.  Respiratory: Positive for shortness of breath (w/ exertion).   Musculoskeletal: Positive for arthralgias (L ankle).  All other systems reviewed and are negative.   PAST MEDICAL/SURGICAL HISTORY:  Past Medical History:  Diagnosis Date  . COPD (chronic obstructive pulmonary disease) ([place])   . MGUS (monoclonal gammopathy of unknown significance)   . Osteoporosis    Past Surgical History:  Procedure Laterality Date  . ABDOMINAL HYSTERECTOMY    . APPENDECTOMY    . BONE BIOPSY Left 03/25/14  . BONE MARROW ASPIRATION Left 03/25/14  . BONE MARROW BIOPSY Left March 2016  . CESAREAN SECTION      SOCIAL HISTORY:  Social History   Socioeconomic History  . Marital status: Divorced    Spouse name: Not on file  . Number of children: Not on file  . Years of education: Not on file  . Highest education level: Not on file  Occupational History  . Not on file  Tobacco Use  . Smoking status: Current Every Day Smoker    Packs/day: 0.50    Types: Cigarettes    Last attempt to quit: 07/11/2017     Years since quitting: 2.8  . Smokeless tobacco: Former Systems developer    Quit date: 07/31/2012  Vaping Use  . Vaping Use: Never used  Substance and Sexual Activity  . Alcohol use: Yes    Comment: occasionally  . Drug use: No  . Sexual activity: Not on file  Other Topics Concern  . Not on file  Social History Narrative  . Not on file   Social Determinants of Health   Financial Resource Strain: Not on file  Food Insecurity: Not on file  Transportation Needs: Not on file  Physical Activity: Not on file  Stress: Not on file  Social Connections: Not on file  Intimate Partner Violence: Not on file    FAMILY HISTORY:  Family History  Problem Relation Age of Onset  . Heart failure Father   . Arthritis Mother     CURRENT MEDICATIONS:  Current Outpatient Medications  Medication Sig Dispense Refill  . albuterol (PROVENTIL HFA;VENTOLIN HFA) 108 (90 BASE) MCG/ACT inhaler Inhale 2 puffs into the lungs every 6 (six) hours as needed for wheezing or shortness of breath. Reported on 02/06/2015    . albuterol (PROVENTIL) (2.5 MG/3ML) 0.083% nebulizer solution Take 2.5 mg by nebulization every 6 (six) hours as needed for wheezing or shortness of breath.    Marland Kitchen alendronate (FOSAMAX) 70 MG tablet TAKE ONE TABLET (70MG TOTAL) BY MOUTH ONCE A WEEK 12 tablet 1  .  ALPRAZolam (XANAX) 1 MG tablet Take 1 mg by mouth at bedtime as needed.    Marland Kitchen aspirin EC 81 MG tablet Take 1 tablet (81 mg total) by mouth daily. Swallow whole. 90 tablet 3  . budesonide-formoterol (SYMBICORT) 160-4.5 MCG/ACT inhaler Inhale 2 puffs into the lungs 2 (two) times daily. 1 Inhaler 1  . Calcium Carbonate-Vitamin D 600-200 MG-UNIT TABS Take 1 tablet by mouth 2 (two) times daily.    . cholecalciferol (VITAMIN D3) 25 MCG (1000 UNIT) tablet Take 1,000 Units by mouth daily.    Marland Kitchen ibuprofen (ADVIL,MOTRIN) 600 MG tablet Take 600 mg by mouth as needed.    . rosuvastatin (CRESTOR) 5 MG tablet Take 1 tablet (5 mg total) by mouth daily. 90 tablet 3    No current facility-administered medications for this visit.    ALLERGIES:  Allergies  Allergen Reactions  . Avelox [Moxifloxacin Hcl In Nacl] Rash    PHYSICAL EXAM:  Performance status (ECOG): 1 - Symptomatic but completely ambulatory  There were no vitals filed for this visit. Wt Readings from Last 3 Encounters:  03/27/20 96 lb 9.6 oz (43.8 kg)  01/09/20 94 lb 8 oz (42.9 kg)  08/27/19 90 lb 3.2 oz (40.9 kg)   Physical Exam Vitals reviewed.  Constitutional:      Appearance: Normal appearance.  Cardiovascular:     Rate and Rhythm: Normal rate and regular rhythm.     Pulses: Normal pulses.     Heart sounds: Normal heart sounds.  Pulmonary:     Effort: Pulmonary effort is normal.     Breath sounds: Normal breath sounds.  Chest:  Breasts:     Right: No axillary adenopathy or supraclavicular adenopathy.     Left: No axillary adenopathy or supraclavicular adenopathy.    Abdominal:     Palpations: Abdomen is soft. There is no hepatomegaly, splenomegaly or mass.     Tenderness: There is no abdominal tenderness.  Musculoskeletal:     Right lower leg: No edema.     Left lower leg: No edema.  Lymphadenopathy:     Cervical:     Right cervical: No superficial cervical adenopathy.    Upper Body:     Right upper body: No supraclavicular, axillary or pectoral adenopathy.     Left upper body: No supraclavicular, axillary or pectoral adenopathy.  Neurological:     General: No focal deficit present.     Mental Status: She is alert and oriented to person, place, and time.  Psychiatric:        Mood and Affect: Mood normal.        Behavior: Behavior normal.     LABORATORY DATA:  I have reviewed the labs as listed.  CBC Latest Ref Rng & Units 05/26/2020 12/25/2019 08/13/2019  WBC 4.0 - 10.5 K/uL 7.8 6.2 7.4  Hemoglobin 12.0 - 15.0 g/dL 15.1(H) 14.1 14.6  Hematocrit 36.0 - 46.0 % 45.9 42.4 44.9  Platelets 150 - 400 K/uL 359 321 365   CMP Latest Ref Rng & Units 05/26/2020  12/25/2019 08/13/2019  Glucose 70 - 99 mg/dL 85 122(H) 90  BUN 8 - 23 mg/dL _0 Creatinine 0.44 - 1.00 mg/dL 0.75 0.64 0.70  Sodium 135 - 145 mmol/L 134(L) 136 136  Potassium 3.5 - 5.1 mmol/L 3.8 3.4(L) 4.1  Chloride 98 - 111 mmol/L 101 106 101  CO2 22 - 32 mmol/L _1 Calcium 8.9 - 10.3 mg/dL 8.9 9.1 9.3  Total Protein 6.5 - 8.1  g/dL 8.7(H) 8.0 8.4(H)  Total Bilirubin 0.3 - 1.2 mg/dL 0.7 0.5 0.8  Alkaline Phos 38 - 126 U/L 69 62 70  AST 15 - 41 U/L _0 ALT 0 - 44 U/L _1 Component Value Date/Time   RBC 4.78 05/26/2020 1340   MCV 96.0 05/26/2020 1340   MCH 31.6 05/26/2020 1340   MCHC 32.9 05/26/2020 1340   RDW 13.2 05/26/2020 1340   LYMPHSABS 2.9 05/26/2020 1340   MONOABS 0.6 05/26/2020 1340   EOSABS 0.4 05/26/2020 1340   BASOSABS 0.1 05/26/2020 1340    DIAGNOSTIC IMAGING:  I have independently reviewed the scans and discussed with the patient. No results found.   ASSESSMENT:  1. IgG kappa MGUS: -Bone marrow biopsy in March 2016 shows 9% plasma cells with kappa restriction. -Skeletal survey from 07/11/2017 shows stable T7 compression deformity with no new lytic lesions. -Labs from 12/25/2019 returned showing an M spike of 1.8.  Creatinine 0.64, calcium 9.1, and hemoglobin of 14.1.  -Free light chain ratio has increased to 53.76 from 48.03. Kappa light chains were 338.7.Lambda light chain was 6.3. -A skeletal survey from 12/25/2019 shows no evidence of multiple myeloma.  2. Smoking history: -She smoked 1 pack/day for the past 42 years. -CT lung on 11/24/2019 returned showing a Lung-RADS 2S  3. Osteoporosis: -Bone density test on 08/13/2019 shows T score -3.4. -She is taking Fosamax weekly and also continue to take calcium and vitamin D.  4. Health maintenance: -Mammogram on 11/28/2018 was BI-RADS Category 1.   PLAN:  1. IgG kappa MGUS: -She does not report any new onset bone pains.  No B symptoms or recurrent infections. -  Reviewed labs from 05/26/2020 which showed M spike increased to 1.9.  Light chain ratio has increased to 64 with kappa light chains at 275.  Hemoglobin and creatinine were normal.  Calcium was normal. - Last skeletal survey on 12/25/2019 was negative for any lytic lesions. - Recommend follow-up in 3 months with repeat myeloma labs.  If the SPEP and free light chain ratio continues to go up, will consider PET scan.  2. Smoking history: -CT chest low-dose on 11/24/2019 was lung RADS 2S.  3. Osteoporosis: -Continue Fosamax.  Continue calcium and vitamin D.  Vitamin D level was normal.  She is taking 4000 units daily..  4. Health maintenance: - Mammogram on 02/20/2020 was BI-RADS Category 1.  Orders placed this encounter:  No orders of the defined types were placed in this encounter.    Derek Jack, MD [place] [phone]   I, Thana Ates, am acting as a scribe for Dr. Derek Jack.  I, Derek Jack MD, have reviewed the above documentation for accuracy and completeness, and I agree with the above.

## 2020-06-03 NOTE — Patient Instructions (Signed)
[facility] at [facility] Discharge Instructions  You were seen today by Dr. Katragadda. He went over your recent results. Dr. Katragadda will see you back in 3 months for labs and follow up.   Thank you for choosing [facility] at [facility] to provide your oncology and hematology care.  To afford each patient quality time with our provider, please arrive at least 15 minutes before your scheduled appointment time.   If you have a lab appointment with the Cancer Center please come in thru the Main Entrance and check in at the main information desk  You need to re-schedule your appointment should you arrive 10 or more minutes late.  We strive to give you quality time with our providers, and arriving late affects you and other patients whose appointments are after yours.  Also, if you no show three or more times for appointments you may be dismissed from the clinic at the providers discretion.     Again, thank you for choosing [facility].  Our hope is that these requests will decrease the amount of time that you wait before being seen by our physicians.       _____________________________________________________________  Should you have questions after your visit to [facility], please contact our office at [phone] between the hours of 8:00 a.m. and 4:30 p.m.  Voicemails left after 4:00 p.m. will not be returned until the following business day.  For prescription refill requests, have your pharmacy contact our office and allow 72 hours.    Cancer Center Support Programs:   > Cancer Support Group  2nd Tuesday of the month 1pm-2pm, Journey Room   

## 2020-07-02 DIAGNOSIS — I2510 Atherosclerotic heart disease of native coronary artery without angina pectoris: Secondary | ICD-10-CM | POA: Diagnosis not present

## 2020-07-03 LAB — HEPATIC FUNCTION PANEL
ALT: 18 IU/L (ref 0–32)
AST: 16 IU/L (ref 0–40)
Albumin: 4.4 g/dL (ref 3.8–4.8)
Alkaline Phosphatase: 71 IU/L (ref 44–121)
Bilirubin Total: 0.5 mg/dL (ref 0.0–1.2)
Bilirubin, Direct: 0.11 mg/dL (ref 0.00–0.40)
Total Protein: 8.2 g/dL (ref 6.0–8.5)

## 2020-07-03 LAB — LIPID PANEL
Chol/HDL Ratio: 3 ratio (ref 0.0–4.4)
Cholesterol, Total: 146 mg/dL (ref 100–199)
HDL: 49 mg/dL (ref >39)
LDL Chol Calc (NIH): 76 mg/dL (ref 0–99)
Triglycerides: 119 mg/dL (ref 0–149)
VLDL Cholesterol Cal: 21 mg/dL (ref 5–40)

## 2020-08-05 DIAGNOSIS — J449 Chronic obstructive pulmonary disease, unspecified: Secondary | ICD-10-CM | POA: Diagnosis not present

## 2020-08-05 DIAGNOSIS — Z681 Body mass index (BMI) 19 or less, adult: Secondary | ICD-10-CM | POA: Diagnosis not present

## 2020-08-05 DIAGNOSIS — G4700 Insomnia, unspecified: Secondary | ICD-10-CM | POA: Diagnosis not present

## 2020-08-05 DIAGNOSIS — R69 Illness, unspecified: Secondary | ICD-10-CM | POA: Diagnosis not present

## 2020-09-01 ENCOUNTER — Other Ambulatory Visit (HOSPITAL_COMMUNITY): Payer: Self-pay | Admitting: Hematology

## 2020-09-01 DIAGNOSIS — M810 Age-related osteoporosis without current pathological fracture: Secondary | ICD-10-CM

## 2020-09-02 ENCOUNTER — Other Ambulatory Visit: Payer: Self-pay | Admitting: Cardiology

## 2020-09-10 ENCOUNTER — Inpatient Hospital Stay (HOSPITAL_COMMUNITY): Payer: Medicare HMO | Attending: Hematology

## 2020-09-10 ENCOUNTER — Other Ambulatory Visit: Payer: Self-pay

## 2020-09-10 DIAGNOSIS — J449 Chronic obstructive pulmonary disease, unspecified: Secondary | ICD-10-CM | POA: Diagnosis not present

## 2020-09-10 DIAGNOSIS — M810 Age-related osteoporosis without current pathological fracture: Secondary | ICD-10-CM | POA: Insufficient documentation

## 2020-09-10 DIAGNOSIS — D472 Monoclonal gammopathy: Secondary | ICD-10-CM

## 2020-09-10 DIAGNOSIS — C9000 Multiple myeloma not having achieved remission: Secondary | ICD-10-CM | POA: Diagnosis present

## 2020-09-10 DIAGNOSIS — M818 Other osteoporosis without current pathological fracture: Secondary | ICD-10-CM

## 2020-09-10 DIAGNOSIS — F17210 Nicotine dependence, cigarettes, uncomplicated: Secondary | ICD-10-CM | POA: Diagnosis not present

## 2020-09-10 DIAGNOSIS — R69 Illness, unspecified: Secondary | ICD-10-CM | POA: Diagnosis not present

## 2020-09-10 LAB — CBC WITH DIFFERENTIAL/PLATELET
Abs Immature Granulocytes: 0.01 10*3/uL (ref 0.00–0.07)
Basophils Absolute: 0.1 10*3/uL (ref 0.0–0.1)
Basophils Relative: 1 %
Eosinophils Absolute: 0.5 10*3/uL (ref 0.0–0.5)
Eosinophils Relative: 7 %
HCT: 43.8 % (ref 36.0–46.0)
Hemoglobin: 14.7 g/dL (ref 12.0–15.0)
Immature Granulocytes: 0 %
Lymphocytes Relative: 37 %
Lymphs Abs: 2.5 10*3/uL (ref 0.7–4.0)
MCH: 31.9 pg (ref 26.0–34.0)
MCHC: 33.6 g/dL (ref 30.0–36.0)
MCV: 95 fL (ref 80.0–100.0)
Monocytes Absolute: 0.4 10*3/uL (ref 0.1–1.0)
Monocytes Relative: 7 %
Neutro Abs: 3.2 10*3/uL (ref 1.7–7.7)
Neutrophils Relative %: 48 %
Platelets: 326 10*3/uL (ref 150–400)
RBC: 4.61 MIL/uL (ref 3.87–5.11)
RDW: 13.5 % (ref 11.5–15.5)
WBC: 6.6 10*3/uL (ref 4.0–10.5)
nRBC: 0 % (ref 0.0–0.2)

## 2020-09-10 LAB — COMPREHENSIVE METABOLIC PANEL
ALT: 20 U/L (ref 0–44)
AST: 24 U/L (ref 15–41)
Albumin: 4.2 g/dL (ref 3.5–5.0)
Alkaline Phosphatase: 66 U/L (ref 38–126)
Anion gap: 5 (ref 5–15)
BUN: 16 mg/dL (ref 8–23)
CO2: 24 mmol/L (ref 22–32)
Calcium: 8.8 mg/dL — ABNORMAL LOW (ref 8.9–10.3)
Chloride: 103 mmol/L (ref 98–111)
Creatinine, Ser: 0.72 mg/dL (ref 0.44–1.00)
GFR, Estimated: >60 mL/min (ref >60)
Glucose, Bld: 97 mg/dL (ref 70–99)
Potassium: 3.9 mmol/L (ref 3.5–5.1)
Sodium: 132 mmol/L — ABNORMAL LOW (ref 135–145)
Total Bilirubin: 0.7 mg/dL (ref 0.3–1.2)
Total Protein: 8.5 g/dL — ABNORMAL HIGH (ref 6.5–8.1)

## 2020-09-10 LAB — LACTATE DEHYDROGENASE: LDH: 92 U/L — ABNORMAL LOW (ref 98–192)

## 2020-09-10 LAB — VITAMIN B12: Vitamin B-12: 255 pg/mL (ref 180–914)

## 2020-09-11 LAB — KAPPA/LAMBDA LIGHT CHAINS
Kappa free light chain: 311.9 mg/L — ABNORMAL HIGH (ref 3.3–19.4)
Kappa, lambda light chain ratio: 47.26 — ABNORMAL HIGH (ref 0.26–1.65)
Lambda free light chains: 6.6 mg/L (ref 5.7–26.3)

## 2020-09-12 LAB — PROTEIN ELECTROPHORESIS, SERUM
A/G Ratio: 1.1 (ref 0.7–1.7)
Albumin ELP: 3.9 g/dL (ref 2.9–4.4)
Alpha-1-Globulin: 0.2 g/dL (ref 0.0–0.4)
Alpha-2-Globulin: 0.7 g/dL (ref 0.4–1.0)
Beta Globulin: 0.8 g/dL (ref 0.7–1.3)
Gamma Globulin: 1.9 g/dL — ABNORMAL HIGH (ref 0.4–1.8)
Globulin, Total: 3.7 g/dL (ref 2.2–3.9)
M-Spike, %: 1.7 g/dL — ABNORMAL HIGH
Total Protein ELP: 7.6 g/dL (ref 6.0–8.5)

## 2020-09-16 NOTE — Progress Notes (Signed)
Virtual Visit via Telephone Note [facility]  I connected with Misty Richardson  on 09/17/20  at 10:31 AM by telephone and verified that I am speaking with the correct person using two identifiers.  Location: Patient: Home Provider: [facility]   I discussed the limitations, risks, security and privacy concerns of performing an evaluation and management service by telephone and the availability of in person appointments. I also discussed with the patient that there may be a patient responsible charge related to this service. The patient expressed understanding and agreed to proceed.   HISTORY OF PRESENT ILLNESS: [name] follows at our clinic for her IgG kappa MGUS.  She was last seen by Dr. Delton Coombes on 06/03/2020.  She returns today for routine follow-up and to discuss labs.  At today's visit, she reports feeling fairly well.  She denies any recent hospital stays, new diagnoses, or change in her baseline health status.  Patient reports she has been doing well since her last visit.  Denies any recent fevers, infections, or hospitalizations.  She denies any new bone pain or recent fractures. She denies any B symptoms.   No new neurologic symptoms such as tinnitus, new-onset hearing loss, or dizziness.  Denies any numbness or tingling in hands or feet.  She does report that she has intermittent blurry vision associated with migraine headaches, but that this returns to baseline with rest and Tylenol.    No thromboembolic events since her last visit. Has not noticed any recent bleeding such as epistaxis, hematuria, hematemesis, melena, or hematochezia.  Denies recent chest pain on exertion, pre-syncopal episodes, or palpitations.  She does have some baseline shortness of breath with exertion related to her COPD, but denies any deviations from baseline. No new masses or lymphadenopathy per her report.   Patient reports appetite at 100% and energy level at  75%. She is maintaining stable weight at this time.    OBSERVATIONS/OBJECTIVE: Review of Systems  Constitutional:  Negative for chills, diaphoresis, fever, malaise/fatigue and weight loss.  Respiratory:  Positive for shortness of breath (COPD). Negative for cough.   Cardiovascular:  Negative for chest pain and palpitations.  Gastrointestinal:  Negative for abdominal pain, blood in stool, melena, nausea and vomiting.  Neurological:  Negative for dizziness and headaches.    PHYSICAL EXAM (per limitations of virtual telephone visit): The patient is alert and oriented x 3, exhibiting adequate mentation, good mood, and ability to speak in full sentences and execute sound judgement.   ASSESSMENT & PLAN: 1.  IgG kappa MGUS - Bone marrow biopsy in March 2016 shows 9% plasma cells with kappa restriction. (Previous biopsy in August 2010 showed 3% plasma cells) - Skeletal survey from 07/11/2017 shows stable T7 compression deformity with no new lytic lesions - Skeletal survey from 12/25/2019 shows no evidence of multiple myeloma, no lytic lesions - Most recent myeloma panel (09/10/2020): SPEP with M spike 1.7 (down from 1.9) and free light chain ratio of 47.26 (down from 64.12) kappa free light chains 311.9; LDH 92 - Most recent labs (09/10/2020): Normal CBC with Hgb 14.7, creatinine 0.72, calcium 8.8, B12 255 - She does not report any new onset bone pains, B symptoms, or recurrent infections  - At last visit, there was concern that her SPEP and FLC ratio had increased slightly, but they have decreased again since that appointment. - PLAN:  Repeat myeloma labs in 3-4 months, if no changes from baseline, we will go back to 66-month  visits at that time.  Skeletal survey due in December 2022.  If she has any significantly increased levels, we will consider PET scan/or repeat bone marrow biopsy.  2.  Smoking history: - She smoked 1 pack/day for the past 42 years. - CT lung on 11/24/2019 returned showing a  Lung-RADS 2S - PLAN: Next LDCT chest due November 2022   3.  Osteoporosis: - Bone density test on 08/13/2019 shows T score -3.4. -She is taking Fosamax weekly and also continue to take calcium and vitamin D. - PLAN: Continue Fosamax, calcium, and vitamin D   4.  Health maintenance: - Mammogram on 02/20/2020 was BI-RADS Category 1, negative - PLAN: Continue screening mammograms via PCP   FOLLOW UP INSTRUCTIONS: - Labs in 3 to 4 months (myeloma panel), plus skeletal surveys - Office visit after labs and x-rays - LDCT chest also due November 2022   I discussed the assessment and treatment plan with the patient. The patient was provided an opportunity to ask questions and all were answered. The patient agreed with the plan and demonstrated an understanding of the instructions.   The patient was advised to call back or seek an in-person evaluation if the symptoms worsen or if the condition fails to improve as anticipated.  I provided 18 minutes of non-face-to-face time during this encounter.  Harriett Rush, PA-C 09/17/20 10:49 AM

## 2020-09-17 ENCOUNTER — Encounter (HOSPITAL_COMMUNITY): Payer: Self-pay | Admitting: *Deleted

## 2020-09-17 ENCOUNTER — Inpatient Hospital Stay (HOSPITAL_BASED_OUTPATIENT_CLINIC_OR_DEPARTMENT_OTHER): Payer: Medicare HMO | Admitting: Physician Assistant

## 2020-09-17 ENCOUNTER — Other Ambulatory Visit: Payer: Self-pay

## 2020-09-17 DIAGNOSIS — D472 Monoclonal gammopathy: Secondary | ICD-10-CM | POA: Diagnosis not present

## 2020-11-12 DIAGNOSIS — H5200 Hypermetropia, unspecified eye: Secondary | ICD-10-CM | POA: Diagnosis not present

## 2020-11-12 DIAGNOSIS — Z0100 Encounter for examination of eyes and vision without abnormal findings: Secondary | ICD-10-CM | POA: Diagnosis not present

## 2020-11-20 DIAGNOSIS — Z23 Encounter for immunization: Secondary | ICD-10-CM | POA: Diagnosis not present

## 2020-12-01 ENCOUNTER — Ambulatory Visit
Admission: EM | Admit: 2020-12-01 | Discharge: 2020-12-01 | Disposition: A | Discharge status: Home / Self Care | Payer: Medicare HMO | Attending: Family Medicine | Admitting: Family Medicine

## 2020-12-01 ENCOUNTER — Other Ambulatory Visit: Payer: Self-pay

## 2020-12-01 DIAGNOSIS — M549 Dorsalgia, unspecified: Secondary | ICD-10-CM | POA: Diagnosis not present

## 2020-12-01 DIAGNOSIS — R1010 Upper abdominal pain, unspecified: Secondary | ICD-10-CM

## 2020-12-01 LAB — POCT URINALYSIS DIP (MANUAL ENTRY)
Bilirubin, UA: NEGATIVE
Blood, UA: NEGATIVE
Glucose, UA: NEGATIVE mg/dL
Ketones, POC UA: NEGATIVE mg/dL
Leukocytes, UA: NEGATIVE
Nitrite, UA: NEGATIVE
Protein Ur, POC: NEGATIVE mg/dL
Spec Grav, UA: 1.015 (ref 1.010–1.025)
Urobilinogen, UA: 0.2 E.U./dL — AB
pH, UA: 8.5 — AB (ref 5.0–8.0)

## 2020-12-01 MED ORDER — KETOROLAC TROMETHAMINE 30 MG/ML IJ SOLN
30.0000 mg | Freq: Once | INTRAMUSCULAR | Status: AC
  Administered 2020-12-01: 30 mg via INTRAMUSCULAR

## 2020-12-01 NOTE — ED Triage Notes (Signed)
Pt presents with c/o body aches with back pain radiating to abdomen

## 2020-12-01 NOTE — Discharge Instructions (Signed)
Go to the emergency department if your pain worsens at any time.

## 2020-12-03 DIAGNOSIS — J449 Chronic obstructive pulmonary disease, unspecified: Secondary | ICD-10-CM | POA: Diagnosis not present

## 2020-12-03 DIAGNOSIS — J22 Unspecified acute lower respiratory infection: Secondary | ICD-10-CM | POA: Diagnosis not present

## 2020-12-03 LAB — COMPREHENSIVE METABOLIC PANEL
ALT: 22 IU/L (ref 0–32)
AST: 29 IU/L (ref 0–40)
Albumin/Globulin Ratio: 1.1 — ABNORMAL LOW (ref 1.2–2.2)
Albumin: 4.4 g/dL (ref 3.8–4.8)
Alkaline Phosphatase: 89 IU/L (ref 44–121)
BUN/Creatinine Ratio: 12 (ref 12–28)
BUN: 9 mg/dL (ref 8–27)
Bilirubin Total: 0.3 mg/dL (ref 0.0–1.2)
CO2: 20 mmol/L (ref 20–29)
Calcium: 9 mg/dL (ref 8.7–10.3)
Chloride: 98 mmol/L (ref 96–106)
Creatinine, Ser: 0.75 mg/dL (ref 0.57–1.00)
Globulin, Total: 3.9 g/dL (ref 1.5–4.5)
Glucose: 84 mg/dL (ref 70–99)
Potassium: 4.1 mmol/L (ref 3.5–5.2)
Sodium: 133 mmol/L — ABNORMAL LOW (ref 134–144)
Total Protein: 8.3 g/dL (ref 6.0–8.5)
eGFR: 89 mL/min/{1.73_m2} (ref >59)

## 2020-12-03 LAB — CBC WITH DIFFERENTIAL/PLATELET
Basophils Absolute: 0.1 10*3/uL (ref 0.0–0.2)
Basos: 1 %
EOS (ABSOLUTE): 0.2 10*3/uL (ref 0.0–0.4)
Eos: 4 %
Hematocrit: 43.5 % (ref 34.0–46.6)
Hemoglobin: 14.4 g/dL (ref 11.1–15.9)
Immature Grans (Abs): 0 10*3/uL (ref 0.0–0.1)
Immature Granulocytes: 0 %
Lymphocytes Absolute: 0.5 10*3/uL — ABNORMAL LOW (ref 0.7–3.1)
Lymphs: 8 %
MCH: 31.6 pg (ref 26.6–33.0)
MCHC: 33.1 g/dL (ref 31.5–35.7)
MCV: 96 fL (ref 79–97)
Monocytes Absolute: 0.4 10*3/uL (ref 0.1–0.9)
Monocytes: 7 %
Neutrophils Absolute: 4.8 10*3/uL (ref 1.4–7.0)
Neutrophils: 80 %
Platelets: 297 10*3/uL (ref 150–450)
RBC: 4.55 x10E6/uL (ref 3.77–5.28)
RDW: 13.2 % (ref 11.7–15.4)
WBC: 6 10*3/uL (ref 3.4–10.8)

## 2020-12-03 LAB — LIPASE: Lipase: 17 U/L (ref 14–72)

## 2020-12-05 NOTE — ED Provider Notes (Signed)
[facility]    CSN: [id number] Arrival date & time: 12/01/20  1701      History   Chief Complaint Chief Complaint  Patient presents with   Back Pain   Abdominal Pain    HPI Misty Richardson is a 65 y.o. female.   Presenting today with new onset RUQ pain radiating toward flank, body aches. States sxs were mild this morning and significantly worsened this afternoon. Denies associated fever, chills, N/V/D, constipation, CP, SOB. So far has tried OTC pain relievers with minimal relief. No past history of similar episodes. Denies known aggravating or relieving factors. PMHx significant for MGUS, COPD. Surgical history significant for hysterectomy and appendectomy.    Past Medical History:  Diagnosis Date   COPD (chronic obstructive pulmonary disease) (HCC)    MGUS (monoclonal gammopathy of unknown significance)    Osteoporosis     Patient Active Problem List   Diagnosis Date Noted   Osteoporosis 03/14/2014   Tobacco use disorder 09/19/2012   MGUS (monoclonal gammopathy of unknown significance) 09/05/2012   COPD (chronic obstructive pulmonary disease) ([place]) 09/05/2012    Past Surgical History:  Procedure Laterality Date   ABDOMINAL HYSTERECTOMY     APPENDECTOMY     BONE BIOPSY Left 03/25/14   BONE MARROW ASPIRATION Left 03/25/14   BONE MARROW BIOPSY Left March 2016   CESAREAN SECTION      OB History     Gravida  2   Para  2   Term      Preterm      AB      Living  2      SAB      IAB      Ectopic      Multiple      Live Births               Home Medications    Prior to Admission medications   Medication Sig Start Date End Date Taking? Authorizing Provider  albuterol (PROVENTIL HFA;VENTOLIN HFA) 108 (90 BASE) MCG/ACT inhaler Inhale 2 puffs into the lungs every 6 (six) hours as needed for wheezing or shortness of breath. Reported on 02/06/2015    [provider]  albuterol (PROVENTIL) (2.5 MG/3ML) 0.083% nebulizer solution  Take 2.5 mg by nebulization every 6 (six) hours as needed for wheezing or shortness of breath.    [provider]  alendronate (FOSAMAX) 70 MG tablet TAKE ONE TABLET (70MG TOTAL) BY MOUTH ONCE A WEEK 09/01/20   Derek Jack, MD  ALPRAZolam Duanne Moron) 1 MG tablet Take 1 mg by mouth at bedtime as needed. 12/02/16   [provider]  aspirin EC 81 MG tablet Take 1 tablet (81 mg total) by mouth daily. Swallow whole. 03/27/20   Satira Sark, MD  budesonide-formoterol [facility]) 160-4.5 MCG/ACT inhaler Inhale 2 puffs into the lungs 2 (two) times daily. 09/23/12   Doree Albee, MD  Calcium Carbonate-Vitamin D 600-200 MG-UNIT TABS Take 1 tablet by mouth 2 (two) times daily.    [provider]  cholecalciferol (VITAMIN D3) 25 MCG (1000 UNIT) tablet Take 1,000 Units by mouth daily.    [provider]  ibuprofen (ADVIL,MOTRIN) 600 MG tablet Take 600 mg by mouth as needed. 09/26/17   [provider]  rosuvastatin (CRESTOR) 5 MG tablet TAKE ONE TABLET (5 MG TOTAL) BY MOUTH DAILY. 09/02/20   Satira Sark, MD    Family History Family History  Problem Relation Age of Onset  Heart failure Father    Arthritis Mother     Social History Social History   Tobacco Use   Smoking status: Every Day    Packs/day: 0.50    Types: Cigarettes    Last attempt to quit: 07/11/2017    Years since quitting: 3.4   Smokeless tobacco: Former    Quit date: 07/31/2012  Vaping Use   Vaping Use: Never used  Substance Use Topics   Alcohol use: Yes    Comment: occasionally   Drug use: No     Allergies   Avelox [moxifloxacin hcl in nacl]   Review of Systems Review of Systems PER HPI  Physical Exam Triage Vital Signs ED Triage Vitals  Enc Vitals Group     BP 12/01/20 1937 119/78     Pulse Rate 12/01/20 1937 99     Resp 12/01/20 1937 (!) 22     Temp 12/01/20 1937 98.6 F (37 C)     Temp src --      SpO2 12/01/20 1937 95 %     Weight --      Height  --      Head Circumference --      Peak Flow --      Pain Score 12/01/20 1935 9     Pain Loc --      Pain Edu? --      Excl. in [place]? --    No data found.  Updated Vital Signs BP 119/78   Pulse 99   Temp 98.6 F (37 C)   Resp (!) 22   SpO2 95%   Visual Acuity Right Eye Distance:   Left Eye Distance:   Bilateral Distance:    Right Eye Near:   Left Eye Near:    Bilateral Near:     Physical Exam Vitals and nursing note reviewed.  Constitutional:      Appearance: Normal appearance.     Comments: Appears uncomfortable  HENT:     Head: Atraumatic.     Mouth/Throat:     Mouth: Mucous membranes are moist.  Eyes:     Extraocular Movements: Extraocular movements intact.     Conjunctiva/sclera: Conjunctivae normal.  Cardiovascular:     Rate and Rhythm: Normal rate and regular rhythm.     Heart sounds: Normal heart sounds.  Pulmonary:     Effort: Pulmonary effort is normal.     Breath sounds: Normal breath sounds.  Abdominal:     General: Bowel sounds are normal. There is no distension.     Palpations: Abdomen is soft. There is no mass.     Tenderness: There is abdominal tenderness. There is guarding. There is no right CVA tenderness, left CVA tenderness or rebound.     Comments: RUQ and epigastric ttp without distention  Musculoskeletal:        General: Normal range of motion.     Cervical back: Normal range of motion and neck supple.  Skin:    General: Skin is warm and dry.  Neurological:     Mental Status: She is alert and oriented to person, place, and time.  Psychiatric:        Mood and Affect: Mood normal.        Thought Content: Thought content normal.        Judgment: Judgment normal.     UC Treatments / Results  Labs (all labs ordered are listed, but only abnormal results are displayed) Labs Reviewed  CBC WITH DIFFERENTIAL/PLATELET - Abnormal;  Notable for the following components:      Result Value   Lymphocytes Absolute 0.5 (*)    All other  components within normal limits   Narrative:    Performed at:  [address] [place], [place]  [id number] Lab Director: Rush Farmer MD, Phone:  [phone]  COMPREHENSIVE METABOLIC PANEL - Abnormal; Notable for the following components:   Sodium 133 (*)    Albumin/Globulin Ratio 1.1 (*)    All other components within normal limits   Narrative:    Performed at:  [facility] [address] [place], [place]  [id number] Lab Director: Rush Farmer MD, Phone:  [phone]  POCT URINALYSIS DIP (MANUAL ENTRY) - Abnormal; Notable for the following components:   pH, UA 8.5 (*)    Urobilinogen, UA 0.2 (*)    All other components within normal limits  LIPASE   Narrative:    Performed at:  [address] [place], [place]  [id number] Lab Director: Rush Farmer MD, Phone:  [phone]    EKG   Radiology No results found.  Procedures Procedures (including critical care time)  Medications Ordered in UC Medications  ketorolac (TORADOL) 30 MG/ML injection 30 mg (30 mg Intramuscular Given 12/01/20 2015)    Initial Impression / Assessment and Plan / UC Course  I have reviewed the triage vital signs and the nursing notes.  Pertinent labs & imaging results that were available during my care of the patient were reviewed by me and considered in my medical decision making (see chart for details).     Vital signs overall reassuring and WNL, exam unrevealing apart from abdominal TTP. U/A without obvious evidence of UTI or kidney stone, basic labs pending for safety check. Discussed recommendation to go to ED given severity of pain for further evaluation and imaging, patient declines at this time and wishes to return home to rest. IM toradol given for pain, discussed supportive home care and strict ED precautions.   Final Clinical Impressions(s) / UC Diagnoses   Final diagnoses:  Upper abdominal pain  Mid back pain      Discharge Instructions      Go to the emergency department if your pain worsens at any time.    ED Prescriptions   None    PDMP not reviewed this encounter.   Merrie Roof [place], [place] 12/05/20 [phone]

## 2020-12-22 ENCOUNTER — Ambulatory Visit (HOSPITAL_COMMUNITY)
Admission: RE | Admit: 2020-12-22 | Discharge: 2020-12-22 | Disposition: A | Discharge status: Home / Self Care | Payer: Medicare HMO | Source: Ambulatory Visit | Attending: Physician Assistant | Admitting: Physician Assistant

## 2020-12-22 ENCOUNTER — Other Ambulatory Visit: Payer: Self-pay

## 2020-12-22 ENCOUNTER — Inpatient Hospital Stay (HOSPITAL_COMMUNITY): Payer: Medicare HMO | Attending: Hematology

## 2020-12-22 DIAGNOSIS — D472 Monoclonal gammopathy: Secondary | ICD-10-CM | POA: Diagnosis not present

## 2020-12-22 LAB — COMPREHENSIVE METABOLIC PANEL
ALT: 25 U/L (ref 0–44)
AST: 28 U/L (ref 15–41)
Albumin: 4.1 g/dL (ref 3.5–5.0)
Alkaline Phosphatase: 73 U/L (ref 38–126)
Anion gap: 5 (ref 5–15)
BUN: 14 mg/dL (ref 8–23)
CO2: 26 mmol/L (ref 22–32)
Calcium: 9.1 mg/dL (ref 8.9–10.3)
Chloride: 103 mmol/L (ref 98–111)
Creatinine, Ser: 0.78 mg/dL (ref 0.44–1.00)
GFR, Estimated: >60 mL/min (ref >60)
Glucose, Bld: 101 mg/dL — ABNORMAL HIGH (ref 70–99)
Potassium: 3.6 mmol/L (ref 3.5–5.1)
Sodium: 134 mmol/L — ABNORMAL LOW (ref 135–145)
Total Bilirubin: 0.7 mg/dL (ref 0.3–1.2)
Total Protein: 8.3 g/dL — ABNORMAL HIGH (ref 6.5–8.1)

## 2020-12-22 LAB — CBC WITH DIFFERENTIAL/PLATELET
Abs Immature Granulocytes: 0.02 10*3/uL (ref 0.00–0.07)
Basophils Absolute: 0.1 10*3/uL (ref 0.0–0.1)
Basophils Relative: 2 %
Eosinophils Absolute: 0.3 10*3/uL (ref 0.0–0.5)
Eosinophils Relative: 5 %
HCT: 42.9 % (ref 36.0–46.0)
Hemoglobin: 14.5 g/dL (ref 12.0–15.0)
Immature Granulocytes: 0 %
Lymphocytes Relative: 40 %
Lymphs Abs: 2.8 10*3/uL (ref 0.7–4.0)
MCH: 32.4 pg (ref 26.0–34.0)
MCHC: 33.8 g/dL (ref 30.0–36.0)
MCV: 95.8 fL (ref 80.0–100.0)
Monocytes Absolute: 0.5 10*3/uL (ref 0.1–1.0)
Monocytes Relative: 7 %
Neutro Abs: 3.2 10*3/uL (ref 1.7–7.7)
Neutrophils Relative %: 46 %
Platelets: 434 10*3/uL — ABNORMAL HIGH (ref 150–400)
RBC: 4.48 MIL/uL (ref 3.87–5.11)
RDW: 13.5 % (ref 11.5–15.5)
WBC: 7 10*3/uL (ref 4.0–10.5)
nRBC: 0 % (ref 0.0–0.2)

## 2020-12-22 LAB — LACTATE DEHYDROGENASE: LDH: 96 U/L — ABNORMAL LOW (ref 98–192)

## 2020-12-23 LAB — KAPPA/LAMBDA LIGHT CHAINS
Kappa free light chain: 438.3 mg/L — ABNORMAL HIGH (ref 3.3–19.4)
Kappa, lambda light chain ratio: 65.42 — ABNORMAL HIGH (ref 0.26–1.65)
Lambda free light chains: 6.7 mg/L (ref 5.7–26.3)

## 2020-12-24 LAB — PROTEIN ELECTROPHORESIS, SERUM
A/G Ratio: 1.1 (ref 0.7–1.7)
Albumin ELP: 4.1 g/dL (ref 2.9–4.4)
Alpha-1-Globulin: 0.2 g/dL (ref 0.0–0.4)
Alpha-2-Globulin: 0.8 g/dL (ref 0.4–1.0)
Beta Globulin: 0.8 g/dL (ref 0.7–1.3)
Gamma Globulin: 2.1 g/dL — ABNORMAL HIGH (ref 0.4–1.8)
Globulin, Total: 3.9 g/dL (ref 2.2–3.9)
M-Spike, %: 1.9 g/dL — ABNORMAL HIGH
Total Protein ELP: 8 g/dL (ref 6.0–8.5)

## 2020-12-29 NOTE — Progress Notes (Signed)
[address] [place], [place] [address]   CLINIC:  Medical Oncology/Hematology  PCP:  [facility], [place] [address] [phone]   REASON FOR VISIT:  Follow-up for MGUS  PRIOR THERAPY: None  CURRENT THERAPY: Observation  INTERVAL HISTORY:  Misty Richardson 65 y.o. female returns for routine follow-up of her MGUS.  She was last evaluated via telemedicine visit with [name] PA-C on 09/17/2020.  At today's visit, she reports feeling fairly well.  No recent hospitalizations, surgeries, or changes in baseline health status.  She has chronic left-sided ankle pain following left ankle fracture many years ago, but denies any new bone pain or recent fractures.   She reports that she had some fever associated with an upper respiratory infection in November 2022, but has not had any other fevers, chills, night sweats, or abnormal weight loss.  She denies any new neurologic symptoms such as tinnitus, new-onset hearing loss, blurred vision, headache, or dizziness.  Denies any numbness or tingling in hands or feet.  No thromboembolic events since her last visit. No new masses or lymphadenopathy per her report.  She reports moderate fatigue.  She has 50% energy and 100% appetite. She endorses that she is maintaining a stable weight.    REVIEW OF SYSTEMS:  Review of Systems  Constitutional:  Positive for fatigue. Negative for appetite change, chills, diaphoresis, fever and unexpected weight change.  HENT:   Negative for lump/mass and nosebleeds.   Eyes:  Negative for eye problems.  Respiratory:  Negative for cough, hemoptysis and shortness of breath.   Cardiovascular:  Negative for chest pain, leg swelling and palpitations.  Gastrointestinal:  Negative for abdominal pain, blood in stool, constipation, diarrhea, nausea and vomiting.  Genitourinary:  Negative for hematuria.   Musculoskeletal:  Positive for arthralgias.  Skin:  Negative.   Neurological:  Negative for dizziness, headaches and light-headedness.  Hematological:  Does not bruise/bleed easily.     PAST MEDICAL/SURGICAL HISTORY:  Past Medical History:  Diagnosis Date   COPD (chronic obstructive pulmonary disease) (HCC)    MGUS (monoclonal gammopathy of unknown significance)    Osteoporosis    Past Surgical History:  Procedure Laterality Date   ABDOMINAL HYSTERECTOMY     APPENDECTOMY     BONE BIOPSY Left 03/25/14   BONE MARROW ASPIRATION Left 03/25/14   BONE MARROW BIOPSY Left March 2016   CESAREAN SECTION       SOCIAL HISTORY:  Social History   Socioeconomic History   Marital status: Divorced    Spouse name: Not on file   Number of children: Not on file   Years of education: Not on file   Highest education level: Not on file  Occupational History   Not on file  Tobacco Use   Smoking status: Every Day    Packs/day: 0.50    Types: Cigarettes    Last attempt to quit: 07/11/2017    Years since quitting: 3.4   Smokeless tobacco: Former    Quit date: 07/31/2012  Vaping Use   Vaping Use: Never used  Substance and Sexual Activity   Alcohol use: Yes    Comment: occasionally   Drug use: No   Sexual activity: Not on file  Other Topics Concern   Not on file  Social History Narrative   Not on file   Social Determinants of Health   Financial Resource Strain: Not on file  Food Insecurity: Not on file  Transportation Needs: Not on  file  Physical Activity: Not on file  Stress: Not on file  Social Connections: Not on file  Intimate Partner Violence: Not on file    FAMILY HISTORY:  Family History  Problem Relation Age of Onset   Heart failure Father    Arthritis Mother     CURRENT MEDICATIONS:  Outpatient Encounter Medications as of 12/30/2020  Medication Sig   albuterol (PROVENTIL HFA;VENTOLIN HFA) 108 (90 BASE) MCG/ACT inhaler Inhale 2 puffs into the lungs every 6 (six) hours as needed for wheezing or shortness of breath.  Reported on 02/06/2015   albuterol (PROVENTIL) (2.5 MG/3ML) 0.083% nebulizer solution Take 2.5 mg by nebulization every 6 (six) hours as needed for wheezing or shortness of breath.   alendronate (FOSAMAX) 70 MG tablet TAKE ONE TABLET ($RemoveBef'70MG'cLqkgGBUGB$  TOTAL) BY MOUTH ONCE A WEEK   ALPRAZolam (XANAX) 1 MG tablet Take 1 mg by mouth at bedtime as needed.   aspirin EC 81 MG tablet Take 1 tablet (81 mg total) by mouth daily. Swallow whole.   budesonide-formoterol (SYMBICORT) 160-4.5 MCG/ACT inhaler Inhale 2 puffs into the lungs 2 (two) times daily.   Calcium Carbonate-Vitamin D 600-200 MG-UNIT TABS Take 1 tablet by mouth 2 (two) times daily.   cholecalciferol (VITAMIN D3) 25 MCG (1000 UNIT) tablet Take 1,000 Units by mouth daily.   ibuprofen (ADVIL,MOTRIN) 600 MG tablet Take 600 mg by mouth as needed.   rosuvastatin (CRESTOR) 5 MG tablet TAKE ONE TABLET (5 MG TOTAL) BY MOUTH DAILY.   No facility-administered encounter medications on file as of 12/30/2020.    ALLERGIES:  Allergies  Allergen Reactions   Avelox [Moxifloxacin Hcl In Nacl] Rash     PHYSICAL EXAM:  ECOG PERFORMANCE STATUS: 1 - Symptomatic but completely ambulatory  There were no vitals filed for this visit. There were no vitals filed for this visit. Physical Exam Constitutional:      Appearance: Normal appearance.  HENT:     Head: Normocephalic and atraumatic.     Mouth/Throat:     Mouth: Mucous membranes are moist.  Eyes:     Extraocular Movements: Extraocular movements intact.     Pupils: Pupils are equal, round, and reactive to light.  Cardiovascular:     Rate and Rhythm: Normal rate and regular rhythm.     Pulses: Normal pulses.     Heart sounds: Normal heart sounds.  Pulmonary:     Effort: Pulmonary effort is normal.     Breath sounds: Normal breath sounds.  Abdominal:     General: Bowel sounds are normal.     Palpations: Abdomen is soft.     Tenderness: There is no abdominal tenderness.  Musculoskeletal:        General:  No swelling.     Right lower leg: No edema.     Left lower leg: No edema.  Lymphadenopathy:     Cervical: No cervical adenopathy.  Skin:    General: Skin is warm and dry.  Neurological:     General: No focal deficit present.     Mental Status: She is alert and oriented to person, place, and time.  Psychiatric:        Mood and Affect: Mood normal.        Behavior: Behavior normal.     LABORATORY DATA:  I have reviewed the labs as listed.  CBC    Component Value Date/Time   WBC 7.0 12/22/2020 1036   RBC 4.48 12/22/2020 1036   HGB 14.5 12/22/2020 1036   HGB  14.4 12/01/2020 2015   HCT 42.9 12/22/2020 1036   HCT 43.5 12/01/2020 2015   PLT 434 (H) 12/22/2020 1036   PLT 297 12/01/2020 2015   MCV 95.8 12/22/2020 1036   MCV 96 12/01/2020 2015   MCH 32.4 12/22/2020 1036   MCHC 33.8 12/22/2020 1036   RDW 13.5 12/22/2020 1036   RDW 13.2 12/01/2020 2015   LYMPHSABS 2.8 12/22/2020 1036   LYMPHSABS 0.5 (L) 12/01/2020 2015   MONOABS 0.5 12/22/2020 1036   EOSABS 0.3 12/22/2020 1036   EOSABS 0.2 12/01/2020 2015   BASOSABS 0.1 12/22/2020 1036   BASOSABS 0.1 12/01/2020 2015   CMP Latest Ref Rng & Units 12/22/2020 12/01/2020 09/10/2020  Glucose 70 - 99 mg/dL 101(H) 84 97  BUN 8 - 23 mg/dL $Remove'14 9 16  'pHdrunJ$ Creatinine 0.44 - 1.00 mg/dL 0.78 0.75 0.72  Sodium 135 - 145 mmol/L 134(L) 133(L) 132(L)  Potassium 3.5 - 5.1 mmol/L 3.6 4.1 3.9  Chloride 98 - 111 mmol/L 103 98 103  CO2 22 - 32 mmol/L $RemoveB'26 20 24  'NEToFyPK$ Calcium 8.9 - 10.3 mg/dL 9.1 9.0 8.8(L)  Total Protein 6.5 - 8.1 g/dL 8.3(H) 8.3 8.5(H)  Total Bilirubin 0.3 - 1.2 mg/dL 0.7 0.3 0.7  Alkaline Phos 38 - 126 U/L 73 89 66  AST 15 - 41 U/L $Remo'28 29 24  'VXKFP$ ALT 0 - 44 U/L $Remo'25 22 20    'oeeBV$ DIAGNOSTIC IMAGING:  I have independently reviewed the relevant imaging and discussed with the patient.  ASSESSMENT & PLAN: 1.  IgG kappa MGUS - Bone marrow biopsy in March 2016 shows 9% plasma cells with kappa restriction. (Previous biopsy in August 2010 showed 3%  plasma cells) - Skeletal survey from 07/11/2017 shows stable T7 compression deformity with no new lytic lesions - Skeletal survey from 12/25/2019 shows no evidence of multiple myeloma, no lytic lesions - Most recent skeletal survey (12/22/2020): Diffuse osteopenia with multiple small lucencies noted over the skull/questionable faint lucency noted over the proximal right humerus and questionable small lucency noted over the right posterior eighth rib; myeloma or metastatic disease cannot be excluded, per radiology report. - Most recent myeloma panel (12/22/2020): SPEP with M spike 1.9 (up from 1.7), and free light chain ratio of 65.42 (up from 47.26) kappa free light chains 438.3; LDH 96 - Most recent labs (12/22/2020): Normal CBC with Hgb 14.5, creatinine 0.70, calcium 9.1 - She does not report any new onset bone pains, B symptoms, or recurrent infections    - Her M spike and FLC ratio have been up and down over the past several months, but overall are trending toward increased levels - PLAN: We will continue to watch closely with repeat MGUS/myeloma panel in 3 months. - Follow possible bone lucencies with repeat Xrays in 3 months - skull, right humerus, and right ribs - Will consider bone marrow biopsy or PET if significant progression or development of CRAB signs/symptoms  2.  Smoking history: - She smoked 1 pack/day for the past 42 years. - CT lung on 11/24/2019 returned showing a Lung-RADS 2S - PLAN: Next LDCT chest due November 2022   3.  Osteoporosis: - Bone density test on 08/13/2019 shows T score -3.4. -She is taking Fosamax weekly and also continue to take calcium and vitamin D. - PLAN: Continue Fosamax, calcium, and vitamin D.  We will repeat bone density scan in July 2023.      4.  Health maintenance: - Mammogram on 02/20/2020 was BI-RADS Category 1, negative - PLAN: Continue  screening mammograms via PCP    PLAN SUMMARY & DISPOSITION: Labs, Xrays, and RTC in 3 months  All questions  were answered. The patient knows to call the clinic with any problems, questions or concerns.  Medical decision making: Moderate  Time spent on visit: I spent 20 minutes counseling the patient face to face. The total time spent in the appointment was 30 minutes and more than 50% was on counseling.   Harriett Rush, PA-C  12/30/20 3:54 PM

## 2020-12-30 ENCOUNTER — Other Ambulatory Visit: Payer: Self-pay

## 2020-12-30 ENCOUNTER — Inpatient Hospital Stay (HOSPITAL_COMMUNITY): Payer: Medicare HMO | Admitting: Physician Assistant

## 2020-12-30 VITALS — BP 126/75 | HR 99 | Temp 98.1°F | Resp 18 | Ht 61.42 in | Wt 95.2 lb

## 2020-12-30 DIAGNOSIS — D472 Monoclonal gammopathy: Secondary | ICD-10-CM

## 2020-12-30 DIAGNOSIS — M818 Other osteoporosis without current pathological fracture: Secondary | ICD-10-CM | POA: Diagnosis not present

## 2020-12-30 DIAGNOSIS — Z79899 Other long term (current) drug therapy: Secondary | ICD-10-CM | POA: Diagnosis not present

## 2020-12-30 DIAGNOSIS — F17210 Nicotine dependence, cigarettes, uncomplicated: Secondary | ICD-10-CM | POA: Insufficient documentation

## 2020-12-30 DIAGNOSIS — R69 Illness, unspecified: Secondary | ICD-10-CM | POA: Diagnosis not present

## 2020-12-30 NOTE — Patient Instructions (Signed)
[place] at [facility] Discharge Instructions  You were seen today by [name] PA-C for your MGUS (monoclonal gammopathy of unknown significance).  As we discussed, MGUS is an abnormal protein in your blood which can progress to a type of cancer called multiple myeloma.  Your labs are very slowly increasing which could show some gradual movement towards multiple myeloma.  Your most recent x-rays also showed some very faint spots ("lucencies") on your skull, right arm, and right rib.  These may be an early sign of developing multiple myeloma lesions, which are called "lytic lesions."  However, they are very faint and small at this point in time, and it is too early to know what they mean for the future.  We will check your labs and x-rays again in 3 months and we will see you for a follow-up visit at that time.   Thank you for choosing [place] at [facility] to provide your oncology and hematology care.  To afford each patient quality time with our provider, please arrive at least 15 minutes before your scheduled appointment time.   If you have a lab appointment with the [place] please come in thru the Main Entrance and check in at the main information desk.  You need to re-schedule your appointment should you arrive 10 or more minutes late.  We strive to give you quality time with our providers, and arriving late affects you and other patients whose appointments are after yours.  Also, if you no show three or more times for appointments you may be dismissed from the clinic at the providers discretion.     Again, thank you for choosing [facility].  Our hope is that these requests will decrease the amount of time that you wait before being seen by our physicians.       _____________________________________________________________  Should you have questions after your visit to [facility], please contact our  office at [phone] and follow the prompts.  Our office hours are 8:00 a.m. and 4:30 p.m. Monday - Friday.  Please note that voicemails left after 4:00 p.m. may not be returned until the following business day.  We are closed weekends and major holidays.  You do have access to a nurse 24-7, just call the main number to the clinic [phone] and do not press any options, hold on the line and a nurse will answer the phone.    For prescription refill requests, have your pharmacy contact our office and allow 72 hours.    Due to Covid, you will need to wear a mask upon entering the hospital. If you do not have a mask, a mask will be given to you at the Main Entrance upon arrival. For doctor visits, patients may have 1 support person age 71 or older with them. For treatment visits, patients can not have anyone with them due to social distancing guidelines and our immunocompromised population.

## 2021-02-23 ENCOUNTER — Other Ambulatory Visit (HOSPITAL_COMMUNITY): Payer: Self-pay

## 2021-02-23 DIAGNOSIS — Z122 Encounter for screening for malignant neoplasm of respiratory organs: Secondary | ICD-10-CM

## 2021-02-23 DIAGNOSIS — Z87891 Personal history of nicotine dependence: Secondary | ICD-10-CM

## 2021-02-23 NOTE — Progress Notes (Signed)
Order placed for LDCT per protocol. Patient scheduled for 04/07/21

## 2021-02-25 ENCOUNTER — Other Ambulatory Visit (HOSPITAL_COMMUNITY): Payer: Self-pay | Admitting: Internal Medicine

## 2021-02-25 DIAGNOSIS — Z1231 Encounter for screening mammogram for malignant neoplasm of breast: Secondary | ICD-10-CM

## 2021-03-02 DIAGNOSIS — J042 Acute laryngotracheitis: Secondary | ICD-10-CM | POA: Diagnosis not present

## 2021-03-02 DIAGNOSIS — R69 Illness, unspecified: Secondary | ICD-10-CM | POA: Diagnosis not present

## 2021-03-02 DIAGNOSIS — Z681 Body mass index (BMI) 19 or less, adult: Secondary | ICD-10-CM | POA: Diagnosis not present

## 2021-03-02 DIAGNOSIS — J449 Chronic obstructive pulmonary disease, unspecified: Secondary | ICD-10-CM | POA: Diagnosis not present

## 2021-03-04 ENCOUNTER — Ambulatory Visit (HOSPITAL_COMMUNITY)
Admission: RE | Admit: 2021-03-04 | Discharge: 2021-03-04 | Disposition: A | Discharge status: Home / Self Care | Payer: Medicare HMO | Source: Ambulatory Visit | Attending: Internal Medicine | Admitting: Internal Medicine

## 2021-03-04 ENCOUNTER — Other Ambulatory Visit: Payer: Self-pay

## 2021-03-04 DIAGNOSIS — Z1231 Encounter for screening mammogram for malignant neoplasm of breast: Secondary | ICD-10-CM | POA: Insufficient documentation

## 2021-03-30 ENCOUNTER — Other Ambulatory Visit (HOSPITAL_COMMUNITY): Payer: Self-pay | Admitting: Hematology

## 2021-03-30 DIAGNOSIS — M810 Age-related osteoporosis without current pathological fracture: Secondary | ICD-10-CM

## 2021-03-31 ENCOUNTER — Inpatient Hospital Stay (HOSPITAL_COMMUNITY): Payer: Medicare HMO | Attending: Hematology

## 2021-03-31 ENCOUNTER — Other Ambulatory Visit: Payer: Self-pay

## 2021-03-31 ENCOUNTER — Ambulatory Visit (HOSPITAL_COMMUNITY)
Admission: RE | Admit: 2021-03-31 | Discharge: 2021-03-31 | Disposition: A | Discharge status: Home / Self Care | Payer: Medicare HMO | Source: Ambulatory Visit | Attending: Physician Assistant | Admitting: Physician Assistant

## 2021-03-31 DIAGNOSIS — D472 Monoclonal gammopathy: Secondary | ICD-10-CM | POA: Insufficient documentation

## 2021-03-31 DIAGNOSIS — M8008XA Age-related osteoporosis with current pathological fracture, vertebra(e), initial encounter for fracture: Secondary | ICD-10-CM | POA: Insufficient documentation

## 2021-03-31 DIAGNOSIS — Z79899 Other long term (current) drug therapy: Secondary | ICD-10-CM | POA: Insufficient documentation

## 2021-03-31 DIAGNOSIS — F17210 Nicotine dependence, cigarettes, uncomplicated: Secondary | ICD-10-CM | POA: Insufficient documentation

## 2021-03-31 DIAGNOSIS — R930 Abnormal findings on diagnostic imaging of skull and head, not elsewhere classified: Secondary | ICD-10-CM | POA: Diagnosis not present

## 2021-03-31 DIAGNOSIS — R937 Abnormal findings on diagnostic imaging of other parts of musculoskeletal system: Secondary | ICD-10-CM | POA: Diagnosis not present

## 2021-03-31 LAB — CBC WITH DIFFERENTIAL/PLATELET
Abs Immature Granulocytes: 0.01 10*3/uL (ref 0.00–0.07)
Basophils Absolute: 0.1 10*3/uL (ref 0.0–0.1)
Basophils Relative: 1 %
Eosinophils Absolute: 0.7 10*3/uL — ABNORMAL HIGH (ref 0.0–0.5)
Eosinophils Relative: 10 %
HCT: 45.4 % (ref 36.0–46.0)
Hemoglobin: 14.9 g/dL (ref 12.0–15.0)
Immature Granulocytes: 0 %
Lymphocytes Relative: 40 %
Lymphs Abs: 2.9 10*3/uL (ref 0.7–4.0)
MCH: 31.3 pg (ref 26.0–34.0)
MCHC: 32.8 g/dL (ref 30.0–36.0)
MCV: 95.4 fL (ref 80.0–100.0)
Monocytes Absolute: 0.5 10*3/uL (ref 0.1–1.0)
Monocytes Relative: 7 %
Neutro Abs: 3 10*3/uL (ref 1.7–7.7)
Neutrophils Relative %: 42 %
Platelets: 367 10*3/uL (ref 150–400)
RBC: 4.76 MIL/uL (ref 3.87–5.11)
RDW: 13.2 % (ref 11.5–15.5)
WBC: 7.3 10*3/uL (ref 4.0–10.5)
nRBC: 0 % (ref 0.0–0.2)

## 2021-03-31 LAB — COMPREHENSIVE METABOLIC PANEL
ALT: 26 U/L (ref 0–44)
AST: 24 U/L (ref 15–41)
Albumin: 4.1 g/dL (ref 3.5–5.0)
Alkaline Phosphatase: 69 U/L (ref 38–126)
Anion gap: 7 (ref 5–15)
BUN: 15 mg/dL (ref 8–23)
CO2: 25 mmol/L (ref 22–32)
Calcium: 9.3 mg/dL (ref 8.9–10.3)
Chloride: 105 mmol/L (ref 98–111)
Creatinine, Ser: 0.81 mg/dL (ref 0.44–1.00)
GFR, Estimated: >60 mL/min (ref >60)
Glucose, Bld: 112 mg/dL — ABNORMAL HIGH (ref 70–99)
Potassium: 3.8 mmol/L (ref 3.5–5.1)
Sodium: 137 mmol/L (ref 135–145)
Total Bilirubin: 0.4 mg/dL (ref 0.3–1.2)
Total Protein: 8.5 g/dL — ABNORMAL HIGH (ref 6.5–8.1)

## 2021-03-31 LAB — LACTATE DEHYDROGENASE: LDH: 86 U/L — ABNORMAL LOW (ref 98–192)

## 2021-04-01 ENCOUNTER — Ambulatory Visit
Admission: EM | Admit: 2021-04-01 | Discharge: 2021-04-01 | Disposition: A | Discharge status: Home / Self Care | Payer: Medicare HMO | Attending: Family Medicine | Admitting: Family Medicine

## 2021-04-01 ENCOUNTER — Other Ambulatory Visit: Payer: Self-pay

## 2021-04-01 ENCOUNTER — Encounter: Payer: Self-pay | Admitting: Emergency Medicine

## 2021-04-01 DIAGNOSIS — J441 Chronic obstructive pulmonary disease with (acute) exacerbation: Secondary | ICD-10-CM

## 2021-04-01 DIAGNOSIS — J069 Acute upper respiratory infection, unspecified: Secondary | ICD-10-CM | POA: Diagnosis not present

## 2021-04-01 LAB — KAPPA/LAMBDA LIGHT CHAINS
Kappa free light chain: 300.2 mg/L — ABNORMAL HIGH (ref 3.3–19.4)
Kappa, lambda light chain ratio: 42.89 — ABNORMAL HIGH (ref 0.26–1.65)
Lambda free light chains: 7 mg/L (ref 5.7–26.3)

## 2021-04-01 LAB — PROTEIN ELECTROPHORESIS, SERUM
A/G Ratio: 1.1 (ref 0.7–1.7)
Albumin ELP: 4.3 g/dL (ref 2.9–4.4)
Alpha-1-Globulin: 0.2 g/dL (ref 0.0–0.4)
Alpha-2-Globulin: 0.7 g/dL (ref 0.4–1.0)
Beta Globulin: 0.8 g/dL (ref 0.7–1.3)
Gamma Globulin: 2.2 g/dL — ABNORMAL HIGH (ref 0.4–1.8)
Globulin, Total: 4 g/dL — ABNORMAL HIGH (ref 2.2–3.9)
M-Spike, %: 1.9 g/dL — ABNORMAL HIGH
Total Protein ELP: 8.3 g/dL (ref 6.0–8.5)

## 2021-04-01 MED ORDER — DEXAMETHASONE SODIUM PHOSPHATE 10 MG/ML IJ SOLN
10.0000 mg | Freq: Once | INTRAMUSCULAR | Status: AC
  Administered 2021-04-01: 10 mg via INTRAMUSCULAR

## 2021-04-01 MED ORDER — PREDNISONE 20 MG PO TABS: 40.0000 mg | ORAL_TABLET | Freq: Every day | ORAL | 0 refills | Status: DC

## 2021-04-01 MED ORDER — AZITHROMYCIN 250 MG PO TABS: ORAL_TABLET | ORAL | 0 refills | Status: DC

## 2021-04-01 NOTE — ED Provider Notes (Signed)
?[place] ? ? ? ?CSN: [id number] ?Arrival date & time: 04/01/21  1503 ? ? ?  ? ?History   ?Chief Complaint ?No chief complaint on file. ? ? ?HPI ?Misty Richardson is a 66 y.o. female.  ? ?Presenting with 3-day history of chest tightness, wheezing, cough, shortness of breath on exertion.  She denies fever, chills, congestion, sore throat, abdominal pain, nausea vomiting or diarrhea.  No new sick contacts recently.  Has been using her inhaler and nebulizer regimen compliantly, states the nebulizer seems to provide her most relief right now.  History of COPD and states this feels like her typical exacerbation. ? ? ?Past Medical History:  ?Diagnosis Date  ? COPD (chronic obstructive pulmonary disease) ([place])   ? MGUS (monoclonal gammopathy of unknown significance)   ? Osteoporosis   ? ? ?Patient Active Problem List  ? Diagnosis Date Noted  ? Osteoporosis 03/14/2014  ? Tobacco use disorder 09/19/2012  ? MGUS (monoclonal gammopathy of unknown significance) 09/05/2012  ? COPD (chronic obstructive pulmonary disease) ([place]) 09/05/2012  ? ? ?Past Surgical History:  ?Procedure Laterality Date  ? ABDOMINAL HYSTERECTOMY    ? APPENDECTOMY    ? BONE BIOPSY Left 03/25/14  ? BONE MARROW ASPIRATION Left 03/25/14  ? BONE MARROW BIOPSY Left March 2016  ? CESAREAN SECTION    ? ? ?OB History   ? ? Gravida  ?2  ? Para  ?2  ? Term  ?   ? Preterm  ?   ? AB  ?   ? Living  ?2  ?  ? ? SAB  ?   ? IAB  ?   ? Ectopic  ?   ? Multiple  ?   ? Live Births  ?   ?   ?  ?  ? ? ? ?Home Medications   ? ?Prior to Admission medications   ?Medication Sig Start Date End Date Taking? Authorizing Provider  ?azithromycin (ZITHROMAX) 250 MG tablet Take first 2 tablets together, then 1 every day until finished. 04/01/21  Yes Volney American, PA-C  ?predniSONE (DELTASONE) 20 MG tablet Take 2 tablets (40 mg total) by mouth daily with breakfast. 04/01/21  Yes Volney American, PA-C  ?albuterol (PROVENTIL HFA;VENTOLIN HFA) 108 (90 BASE) MCG/ACT  inhaler Inhale 2 puffs into the lungs every 6 (six) hours as needed for wheezing or shortness of breath. Reported on 02/06/2015    [provider]  ?albuterol (PROVENTIL) (2.5 MG/3ML) 0.083% nebulizer solution Take 2.5 mg by nebulization every 6 (six) hours as needed for wheezing or shortness of breath.    [provider]  ?alendronate (FOSAMAX) 70 MG tablet TAKE ONE TABLET (70MG TOTAL) BY MOUTH ONCE A WEEK 03/30/21   Harriett Rush, PA-C  ?ALPRAZolam (XANAX) 1 MG tablet Take 1 mg by mouth at bedtime as needed. 12/02/16   [provider]  ?aspirin EC 81 MG tablet Take 1 tablet (81 mg total) by mouth daily. Swallow whole. 03/27/20   Satira Sark, MD  ?budesonide-formoterol [facility]) 160-4.5 MCG/ACT inhaler Inhale 2 puffs into the lungs 2 (two) times daily. 09/23/12   Doree Albee, MD  ?Calcium Carbonate-Vitamin D 600-200 MG-UNIT TABS Take 1 tablet by mouth 2 (two) times daily.    [provider]  ?cholecalciferol (VITAMIN D3) 25 MCG (1000 UNIT) tablet Take 1,000 Units by mouth daily.    [provider]  ?ibuprofen (ADVIL,MOTRIN) 600 MG tablet Take 600 mg by mouth as needed. 09/26/17  [provider]  ?rosuvastatin (CRESTOR) 5 MG tablet TAKE ONE TABLET (5 MG TOTAL) BY MOUTH DAILY. 09/02/20   Satira Sark, MD  ? ? ?Family History ?Family History  ?Problem Relation Age of Onset  ? Heart failure Father   ? Arthritis Mother   ? ? ?Social History ?Social History  ? ?Tobacco Use  ? Smoking status: Every Day  ?  Packs/day: 0.50  ?  Types: Cigarettes  ?  Last attempt to quit: 07/11/2017  ?  Years since quitting: 3.7  ? Smokeless tobacco: Former  ?  Quit date: 07/31/2012  ?Vaping Use  ? Vaping Use: Never used  ?Substance Use Topics  ? Alcohol use: Yes  ?  Comment: occasionally  ? Drug use: No  ? ? ? ?Allergies   ?Avelox [moxifloxacin hcl in nacl] ? ? ?Review of Systems ?Review of Systems ?Per HPI ? ?Physical Exam ?Triage Vital Signs ?ED Triage Vitals  ?Enc  Vitals Group  ?   BP 04/01/21 1514 114/70  ?   Pulse Rate 04/01/21 1514 93  ?   Resp 04/01/21 1514 18  ?   Temp 04/01/21 1514 98.1 ?F (36.7 ?C)  ?   Temp Source 04/01/21 1514 Oral  ?   SpO2 04/01/21 1514 95 %  ?   Weight --   ?   Height --   ?   Head Circumference --   ?   Peak Flow --   ?   Pain Score 04/01/21 1515 0  ?   Pain Loc --   ?   Pain Edu? --   ?   Excl. in [place]? --   ? ?No data found. ? ?Updated Vital Signs ?BP 114/70 (BP Location: Right Arm)   Pulse 93   Temp 98.1 ?F (36.7 ?C) (Oral)   Resp 18   SpO2 95%  ? ?Visual Acuity ?Right Eye Distance:   ?Left Eye Distance:   ?Bilateral Distance:   ? ?Right Eye Near:   ?Left Eye Near:    ?Bilateral Near:    ? ?Physical Exam ?Vitals and nursing note reviewed.  ?Constitutional:   ?   Appearance: Normal appearance. She is not ill-appearing.  ?HENT:  ?   Head: Atraumatic.  ?   Right Ear: Tympanic membrane normal.  ?   Left Ear: Tympanic membrane normal.  ?   Nose: Rhinorrhea present.  ?   Mouth/Throat:  ?   Mouth: Mucous membranes are moist.  ?   Pharynx: Oropharynx is clear. No posterior oropharyngeal erythema.  ?Eyes:  ?   Extraocular Movements: Extraocular movements intact.  ?   Conjunctiva/sclera: Conjunctivae normal.  ?Cardiovascular:  ?   Rate and Rhythm: Normal rate and regular rhythm.  ?   Heart sounds: Normal heart sounds.  ?Pulmonary:  ?   Effort: Pulmonary effort is normal.  ?   Breath sounds: Wheezing present. No rales.  ?Musculoskeletal:     ?   General: Normal range of motion.  ?   Cervical back: Normal range of motion and neck supple.  ?Skin: ?   General: Skin is warm and dry.  ?Neurological:  ?   Mental Status: She is alert and oriented to person, place, and time.  ?Psychiatric:     ?   Mood and Affect: Mood normal.     ?   Thought Content: Thought content normal.     ?   Judgment: Judgment normal.  ? ?UC Treatments / Results  ?Labs ?(all labs ordered  are listed, but only abnormal results are displayed) ?Labs Reviewed - No data to  display ? ?EKG ? ? ?Radiology ?DG Skull 1-3 Views ? ?Result Date: 04/01/2021 ?CLINICAL DATA:  Follow-up lucencies EXAM: SKULL - 2 VIEW COMPARISON:  Skeletal survey dated December 22, 2020 FINDINGS: No new lucencies and previously described subtle skull lucencies demonstrate no significant change. There is no evidence of skull fracture or other focal bone lesions. IMPRESSION: No new lucencies and previously described subtle skull lucencies demonstrate no significant change. Electronically Signed   By: Yetta Glassman M.D.   On: 04/01/2021 09:50   ? ?Procedures ?Procedures (including critical care time) ? ?Medications Ordered in UC ?Medications  ?dexamethasone (DECADRON) injection 10 mg (10 mg Intramuscular Given 04/01/21 1554)  ? ? ?Initial Impression / Assessment and Plan / UC Course  ?I have reviewed the triage vital signs and the nursing notes. ? ?Pertinent labs & imaging results that were available during my care of the patient were reviewed by me and considered in my medical decision making (see chart for details). ? ?  ? ?Oxygen saturation 95% on room air, she appears in no respiratory distress today and lungs with only mild wheezes bilaterally.  We will treat for COPD exacerbation with Zithromax, prednisone in addition to her typical inhaler and nebulizer regimen.  Over-the-counter medications and supportive care reviewed.  Return for acutely worsening symptoms.  She requests an IM Decadron injection which was given ? ?Final Clinical Impressions(s) / UC Diagnoses  ? ?Final diagnoses:  ?Viral URI with cough  ?COPD exacerbation ([place])  ? ?Discharge Instructions   ?None ?  ? ?ED Prescriptions   ? ? Medication Sig Dispense Auth. Provider  ? azithromycin (ZITHROMAX) 250 MG tablet Take first 2 tablets together, then 1 every day until finished. 6 tablet Volney American, [place]  ? predniSONE (DELTASONE) 20 MG tablet Take 2 tablets (40 mg total) by mouth daily with breakfast. 10 tablet Volney American, [place]   ? ?  ? ?PDMP not reviewed this encounter. ?  ?Volney American, PA-C ?04/01/21 1610 ? ?

## 2021-04-01 NOTE — ED Triage Notes (Signed)
States she is having a COPD flare up with a hard time breathing x 3 days.  Had chest xray done yesterday. ?

## 2021-04-06 NOTE — Progress Notes (Signed)
? ?[place] ?[address] ?[place], [place] [address] ? ? ?CLINIC:  ?Medical Oncology/Hematology ? ?PCP:  ?Redmond School, MD ?[address] ?[place] [address] ?[phone] ? ? ?REASON FOR VISIT:  ?Follow-up for MGUS ? ?PRIOR THERAPY: None ? ?CURRENT THERAPY: Observation ? ?INTERVAL HISTORY:  ?Misty Richardson 66 y.o. female returns for routine follow-up of her MGUS.  She was last seen by [name] PA-C on 12/30/2020. ? ?At today's visit, she reports feeling fair, apart from some recent exacerbations of her COPD. ? ?She has chronic intermittent left-sided ankle pain due to remote history of left ankle fracture, but she denies any new bone pain or recent fractures. ?She denies any B symptoms such as fever, chills, night sweats, unintentional weight loss.Marland Kitchen   ?No new neurologic symptoms such as tinnitus, new-onset hearing loss, blurred vision, headache, or dizziness.  Denies any numbness or tingling in hands or feet. ?No thromboembolic events since her last visit.  ?No new masses or lymphadenopathy per her report. ?She reports continued fatigue, which is slightly worse than her last visit, which she relates to her recent COPD exacerbation. ? ?She has 50% energy and 90% appetite. She endorses that she is maintaining a stable weight. ? ? ?REVIEW OF SYSTEMS:  ?Review of Systems  ?Constitutional:  Positive for fatigue. Negative for appetite change, chills, diaphoresis, fever and unexpected weight change.  ?HENT:   Negative for lump/mass and nosebleeds.   ?Eyes:  Negative for eye problems.  ?Respiratory:  Positive for shortness of breath (COPD). Negative for cough and hemoptysis.   ?Cardiovascular:  Positive for chest pain (Occasional, none today). Negative for leg swelling and palpitations.  ?Gastrointestinal:  Negative for abdominal pain, blood in stool, constipation, diarrhea, nausea and vomiting.  ?Genitourinary:  Negative for hematuria.   ?Skin: Negative.   ?Neurological:  Negative for  dizziness, headaches and light-headedness.  ?Hematological:  Does not bruise/bleed easily.  ?Psychiatric/Behavioral:  Positive for sleep disturbance. The patient is nervous/anxious.    ? ? ?PAST MEDICAL/SURGICAL HISTORY:  ?Past Medical History:  ?Diagnosis Date  ? COPD (chronic obstructive pulmonary disease) ([place])   ? MGUS (monoclonal gammopathy of unknown significance)   ? Osteoporosis   ? ?Past Surgical History:  ?Procedure Laterality Date  ? ABDOMINAL HYSTERECTOMY    ? APPENDECTOMY    ? BONE BIOPSY Left 03/25/14  ? BONE MARROW ASPIRATION Left 03/25/14  ? BONE MARROW BIOPSY Left March 2016  ? CESAREAN SECTION    ? ? ? ?SOCIAL HISTORY:  ?Social History  ? ?Socioeconomic History  ? Marital status: Divorced  ?  Spouse name: Not on file  ? Number of children: Not on file  ? Years of education: Not on file  ? Highest education level: Not on file  ?Occupational History  ? Not on file  ?Tobacco Use  ? Smoking status: Every Day  ?  Packs/day: 0.50  ?  Types: Cigarettes  ?  Last attempt to quit: 07/11/2017  ?  Years since quitting: 3.7  ? Smokeless tobacco: Former  ?  Quit date: 07/31/2012  ?Vaping Use  ? Vaping Use: Never used  ?Substance and Sexual Activity  ? Alcohol use: Yes  ?  Comment: occasionally  ? Drug use: No  ? Sexual activity: Not on file  ?Other Topics Concern  ? Not on file  ?Social History Narrative  ? Not on file  ? ?Social Determinants of Health  ? ?Financial Resource Strain: Not on file  ?Food Insecurity: Not on file  ?  Transportation Needs: Not on file  ?Physical Activity: Not on file  ?Stress: Not on file  ?Social Connections: Not on file  ?Intimate Partner Violence: Not on file  ? ? ?FAMILY HISTORY:  ?Family History  ?Problem Relation Age of Onset  ? Heart failure Father   ? Arthritis Mother   ? ? ?CURRENT MEDICATIONS:  ?Outpatient Encounter Medications as of 04/07/2021  ?Medication Sig  ? albuterol (PROVENTIL HFA;VENTOLIN HFA) 108 (90 BASE) MCG/ACT inhaler Inhale 2 puffs into the lungs every 6 (six) hours as  needed for wheezing or shortness of breath. Reported on 02/06/2015  ? albuterol (PROVENTIL) (2.5 MG/3ML) 0.083% nebulizer solution Take 2.5 mg by nebulization every 6 (six) hours as needed for wheezing or shortness of breath.  ? alendronate (FOSAMAX) 70 MG tablet TAKE ONE TABLET ($RemoveBef'70MG'SQIArniFyt$  TOTAL) BY MOUTH ONCE A WEEK  ? ALPRAZolam (XANAX) 1 MG tablet Take 1 mg by mouth at bedtime as needed.  ? aspirin EC 81 MG tablet Take 1 tablet (81 mg total) by mouth daily. Swallow whole.  ? azithromycin (ZITHROMAX) 250 MG tablet Take first 2 tablets together, then 1 every day until finished.  ? budesonide-formoterol (SYMBICORT) 160-4.5 MCG/ACT inhaler Inhale 2 puffs into the lungs 2 (two) times daily.  ? Calcium Carbonate-Vitamin D 600-200 MG-UNIT TABS Take 1 tablet by mouth 2 (two) times daily.  ? cholecalciferol (VITAMIN D3) 25 MCG (1000 UNIT) tablet Take 1,000 Units by mouth daily.  ? ibuprofen (ADVIL,MOTRIN) 600 MG tablet Take 600 mg by mouth as needed.  ? predniSONE (DELTASONE) 20 MG tablet Take 2 tablets (40 mg total) by mouth daily with breakfast.  ? rosuvastatin (CRESTOR) 5 MG tablet TAKE ONE TABLET (5 MG TOTAL) BY MOUTH DAILY.  ? ?No facility-administered encounter medications on file as of 04/07/2021.  ? ? ?ALLERGIES:  ?Allergies  ?Allergen Reactions  ? Avelox [Moxifloxacin Hcl In Nacl] Rash  ? ? ? ?PHYSICAL EXAM:  ?ECOG PERFORMANCE STATUS: 1 - Symptomatic but completely ambulatory ? ?There were no vitals filed for this visit. ?There were no vitals filed for this visit. ?Physical Exam ?Constitutional:   ?   Appearance: Normal appearance.  ?HENT:  ?   Head: Normocephalic and atraumatic.  ?   Mouth/Throat:  ?   Mouth: Mucous membranes are moist.  ?Eyes:  ?   Extraocular Movements: Extraocular movements intact.  ?   Pupils: Pupils are equal, round, and reactive to light.  ?Cardiovascular:  ?   Rate and Rhythm: Normal rate and regular rhythm.  ?   Pulses: Normal pulses.  ?   Heart sounds: Normal heart sounds.  ?Pulmonary:  ?    Effort: Pulmonary effort is normal.  ?   Comments: Coarse breath sounds in all lung fields ?Abdominal:  ?   General: Bowel sounds are normal.  ?   Palpations: Abdomen is soft.  ?   Tenderness: There is no abdominal tenderness.  ?Musculoskeletal:     ?   General: No swelling.  ?   Right lower leg: No edema.  ?   Left lower leg: No edema.  ?Lymphadenopathy:  ?   Cervical: No cervical adenopathy.  ?Skin: ?   General: Skin is warm and dry.  ?Neurological:  ?   General: No focal deficit present.  ?   Mental Status: She is alert and oriented to person, place, and time.  ?Psychiatric:     ?   Mood and Affect: Mood normal.     ?   Behavior: Behavior normal.  ? ? ? ?  LABORATORY DATA:  ?I have reviewed the labs as listed.  ?CBC ?   ?Component Value Date/Time  ? WBC 7.3 03/31/2021 1200  ? RBC 4.76 03/31/2021 1200  ? HGB 14.9 03/31/2021 1200  ? HGB 14.4 12/01/2020 2015  ? HCT 45.4 03/31/2021 1200  ? HCT 43.5 12/01/2020 2015  ? PLT 367 03/31/2021 1200  ? PLT 297 12/01/2020 2015  ? MCV 95.4 03/31/2021 1200  ? MCV 96 12/01/2020 2015  ? MCH 31.3 03/31/2021 1200  ? MCHC 32.8 03/31/2021 1200  ? RDW 13.2 03/31/2021 1200  ? RDW 13.2 12/01/2020 2015  ? LYMPHSABS 2.9 03/31/2021 1200  ? LYMPHSABS 0.5 (L) 12/01/2020 2015  ? MONOABS 0.5 03/31/2021 1200  ? EOSABS 0.7 (H) 03/31/2021 1200  ? EOSABS 0.2 12/01/2020 2015  ? BASOSABS 0.1 03/31/2021 1200  ? BASOSABS 0.1 12/01/2020 2015  ? ?CMP Latest Ref Rng & Units 03/31/2021 12/22/2020 12/01/2020  ?Glucose 70 - 99 mg/dL 112(H) 101(H) 84  ?BUN 8 - 23 mg/dL $Remove'15 14 9  'aaaYvja$ ?Creatinine 0.44 - 1.00 mg/dL 0.81 0.78 0.75  ?Sodium 135 - 145 mmol/L 137 134(L) 133(L)  ?Potassium 3.5 - 5.1 mmol/L 3.8 3.6 4.1  ?Chloride 98 - 111 mmol/L 105 103 98  ?CO2 22 - 32 mmol/L $RemoveB'25 26 20  'UYBhRVRJ$ ?Calcium 8.9 - 10.3 mg/dL 9.3 9.1 9.0  ?Total Protein 6.5 - 8.1 g/dL 8.5(H) 8.3(H) 8.3  ?Total Bilirubin 0.3 - 1.2 mg/dL 0.4 0.7 0.3  ?Alkaline Phos 38 - 126 U/L 69 73 89  ?AST 15 - 41 U/L $Remo'24 28 29  'veOuj$ ?ALT 0 - 44 U/L $Remo'26 25 22  'PzGRx$ ? ? ?DIAGNOSTIC  IMAGING:  ?I have independently reviewed the relevant imaging and discussed with the patient. ? ?ASSESSMENT & PLAN: ?1.  IgG kappa MGUS ?- Bone marrow biopsy in March 2016 shows 9% plasma cells with kappa r

## 2021-04-07 ENCOUNTER — Ambulatory Visit (HOSPITAL_COMMUNITY)
Admission: RE | Admit: 2021-04-07 | Discharge: 2021-04-07 | Disposition: A | Discharge status: Home / Self Care | Payer: Medicare HMO | Source: Ambulatory Visit | Attending: Physician Assistant | Admitting: Physician Assistant

## 2021-04-07 ENCOUNTER — Other Ambulatory Visit: Payer: Self-pay

## 2021-04-07 ENCOUNTER — Inpatient Hospital Stay (HOSPITAL_COMMUNITY): Payer: Medicare HMO | Admitting: Physician Assistant

## 2021-04-07 VITALS — BP 118/77 | HR 88 | Temp 98.7°F | Resp 18 | Ht 61.0 in | Wt 95.0 lb

## 2021-04-07 DIAGNOSIS — Z122 Encounter for screening for malignant neoplasm of respiratory organs: Secondary | ICD-10-CM | POA: Insufficient documentation

## 2021-04-07 DIAGNOSIS — M810 Age-related osteoporosis without current pathological fracture: Secondary | ICD-10-CM | POA: Diagnosis not present

## 2021-04-07 DIAGNOSIS — Z87891 Personal history of nicotine dependence: Secondary | ICD-10-CM | POA: Insufficient documentation

## 2021-04-07 DIAGNOSIS — D472 Monoclonal gammopathy: Secondary | ICD-10-CM

## 2021-04-07 DIAGNOSIS — M8008XA Age-related osteoporosis with current pathological fracture, vertebra(e), initial encounter for fracture: Secondary | ICD-10-CM | POA: Diagnosis not present

## 2021-04-07 DIAGNOSIS — Z79899 Other long term (current) drug therapy: Secondary | ICD-10-CM | POA: Diagnosis not present

## 2021-04-07 DIAGNOSIS — F17210 Nicotine dependence, cigarettes, uncomplicated: Secondary | ICD-10-CM | POA: Diagnosis not present

## 2021-04-07 DIAGNOSIS — R69 Illness, unspecified: Secondary | ICD-10-CM | POA: Diagnosis not present

## 2021-04-07 NOTE — Patient Instructions (Signed)
[place] at [facility] ?Discharge Instructions ? ?You were seen today by [name] PA-C for your MGUS ("monoclonal gammopathy of undetermined significance").  As we have previously discussed, MGUS is essentially an abnormal protein in your blood which can progress to a type of cancer called multiple myeloma. ? ?At this time, your blood work does not show any sign of having progressed to multiple myeloma.  The possible "spots" on your bones are stable compared to your last x-ray, and you do not have any growing spots or new spots.  Your blood work is stable and slightly improved from before. ? ?We will check labs again in 4 months to make sure that your labs are still stable. ? ?OTHER TESTS: ?- Low-dose CT scan of the chest TODAY to screen for lung cancer ?- Bone density scan in July 2023 ? ?FOLLOW-UP APPOINTMENT: Office visit in 4 months, after labs ? ? ?Thank you for choosing [place] at [facility] to provide your oncology and hematology care.  To afford each patient quality time with our provider, please arrive at least 15 minutes before your scheduled appointment time.  ? ?If you have a lab appointment with the [place] please come in thru the Main Entrance and check in at the main information desk. ? ?You need to re-schedule your appointment should you arrive 10 or more minutes late.  We strive to give you quality time with our providers, and arriving late affects you and other patients whose appointments are after yours.  Also, if you no show three or more times for appointments you may be dismissed from the clinic at the providers discretion.     ?Again, thank you for choosing [facility].  Our hope is that these requests will decrease the amount of time that you wait before being seen by our physicians.       ?_____________________________________________________________ ? ?Should you have questions after your visit to [facility], please contact our office at [phone] and follow the prompts.  Our office hours are 8:00 a.m. and 4:30 p.m. Monday - Friday.  Please note that voicemails left after 4:00 p.m. may not be returned until the following business day.  We are closed weekends and major holidays.  You do have access to a nurse 24-7, just call the main number to the clinic [phone] and do not press any options, hold on the line and a nurse will answer the phone.   ? ?For prescription refill requests, have your pharmacy contact our office and allow 72 hours.   ? ?Due to Covid, you will need to wear a mask upon entering the hospital. If you do not have a mask, a mask will be given to you at the Main Entrance upon arrival. For doctor visits, patients may have 1 support person age 54 or older with them. For treatment visits, patients can not have anyone with them due to social distancing guidelines and our immunocompromised population.  ? ? ? ?

## 2021-06-01 ENCOUNTER — Other Ambulatory Visit (HOSPITAL_COMMUNITY): Payer: Self-pay | Admitting: Internal Medicine

## 2021-06-01 ENCOUNTER — Other Ambulatory Visit: Payer: Self-pay | Admitting: Internal Medicine

## 2021-06-01 DIAGNOSIS — R109 Unspecified abdominal pain: Secondary | ICD-10-CM | POA: Diagnosis not present

## 2021-06-01 DIAGNOSIS — F5101 Primary insomnia: Secondary | ICD-10-CM | POA: Diagnosis not present

## 2021-06-01 DIAGNOSIS — E039 Hypothyroidism, unspecified: Secondary | ICD-10-CM | POA: Diagnosis not present

## 2021-06-01 DIAGNOSIS — Z9229 Personal history of other drug therapy: Secondary | ICD-10-CM | POA: Diagnosis not present

## 2021-06-01 DIAGNOSIS — Z1331 Encounter for screening for depression: Secondary | ICD-10-CM | POA: Diagnosis not present

## 2021-06-01 DIAGNOSIS — Z681 Body mass index (BMI) 19 or less, adult: Secondary | ICD-10-CM | POA: Diagnosis not present

## 2021-06-01 DIAGNOSIS — Z0001 Encounter for general adult medical examination with abnormal findings: Secondary | ICD-10-CM | POA: Diagnosis not present

## 2021-06-01 DIAGNOSIS — R911 Solitary pulmonary nodule: Secondary | ICD-10-CM | POA: Diagnosis not present

## 2021-06-01 DIAGNOSIS — J449 Chronic obstructive pulmonary disease, unspecified: Secondary | ICD-10-CM | POA: Diagnosis not present

## 2021-06-01 DIAGNOSIS — M810 Age-related osteoporosis without current pathological fracture: Secondary | ICD-10-CM | POA: Diagnosis not present

## 2021-06-01 DIAGNOSIS — R69 Illness, unspecified: Secondary | ICD-10-CM | POA: Diagnosis not present

## 2021-06-01 DIAGNOSIS — E559 Vitamin D deficiency, unspecified: Secondary | ICD-10-CM | POA: Diagnosis not present

## 2021-06-01 DIAGNOSIS — I700 Atherosclerosis of aorta: Secondary | ICD-10-CM | POA: Diagnosis not present

## 2021-06-01 DIAGNOSIS — E782 Mixed hyperlipidemia: Secondary | ICD-10-CM | POA: Diagnosis not present

## 2021-06-16 ENCOUNTER — Ambulatory Visit (HOSPITAL_COMMUNITY)
Admission: RE | Admit: 2021-06-16 | Discharge: 2021-06-16 | Disposition: A | Discharge status: Home / Self Care | Payer: Medicare HMO | Source: Ambulatory Visit | Attending: Internal Medicine | Admitting: Internal Medicine

## 2021-06-16 DIAGNOSIS — K8020 Calculus of gallbladder without cholecystitis without obstruction: Secondary | ICD-10-CM | POA: Diagnosis not present

## 2021-06-16 DIAGNOSIS — G8918 Other acute postprocedural pain: Secondary | ICD-10-CM | POA: Diagnosis not present

## 2021-06-16 DIAGNOSIS — R109 Unspecified abdominal pain: Secondary | ICD-10-CM | POA: Insufficient documentation

## 2021-07-04 ENCOUNTER — Encounter: Payer: Self-pay | Admitting: Emergency Medicine

## 2021-07-04 ENCOUNTER — Ambulatory Visit
Admission: EM | Admit: 2021-07-04 | Discharge: 2021-07-04 | Disposition: A | Discharge status: Home / Self Care | Payer: Medicare HMO | Attending: Family Medicine | Admitting: Family Medicine

## 2021-07-04 ENCOUNTER — Other Ambulatory Visit: Payer: Self-pay

## 2021-07-04 DIAGNOSIS — R1010 Upper abdominal pain, unspecified: Secondary | ICD-10-CM

## 2021-07-04 DIAGNOSIS — M545 Low back pain, unspecified: Secondary | ICD-10-CM | POA: Diagnosis not present

## 2021-07-04 LAB — POCT URINALYSIS DIP (MANUAL ENTRY)
Glucose, UA: NEGATIVE mg/dL
Ketones, POC UA: NEGATIVE mg/dL
Leukocytes, UA: NEGATIVE
Nitrite, UA: NEGATIVE
Protein Ur, POC: NEGATIVE mg/dL
Spec Grav, UA: >=1.03 — AB (ref 1.010–1.025)
Urobilinogen, UA: 0.2 E.U./dL
pH, UA: 5.5 (ref 5.0–8.0)

## 2021-07-04 MED ORDER — KETOROLAC TROMETHAMINE 30 MG/ML IJ SOLN
30.0000 mg | Freq: Once | INTRAMUSCULAR | Status: AC
  Administered 2021-07-04: 30 mg via INTRAMUSCULAR

## 2021-07-04 NOTE — ED Triage Notes (Addendum)
Pt reports cough, low back pain, and bilateral lower quadrant abd pain since Thursday. Pt reports urinary frequency. Denies fevers, dysuria.  Pt reports history of similar in the past and states had abdominal ultrasound on 5/31 that resulted in being diagnosed with kidney stone in left kidney, gall stones with cholecystitis. Was referred to general surgeon and reports has appt next Tuesday.

## 2021-07-04 NOTE — Discharge Instructions (Signed)
You have been seen today for abdominal pain. Your evaluation was not suggestive of any emergent condition requiring medical intervention at this time. However, some abdominal problems make take more time to appear. Therefore, it is very important for you to pay attention to any new symptoms or worsening of your current condition.  Please return here or to the Emergency Department immediately should you begin to feel worse in any way or have any of the following symptoms: increasing or different abdominal pain, persistent vomiting, inability to drink fluids, fevers, or shaking chills.   Meds ordered this encounter  Medications   ketorolac (TORADOL) 30 MG/ML injection 30 mg    You have had labs (blood work) sent today. We will call you with any significant abnormalities or if there is need to begin or change treatment or pursue further follow up.  You may also review your test results online through [facility]. If you do not have a MyChart account, instructions to sign up should be on your discharge paperwork.

## 2021-07-06 NOTE — ED Provider Notes (Signed)
[place]   [id number] 07/04/21 Arrival Time: [place] PLAN:  1. Acute bilateral low back pain without sciatica   2. Pain of upper abdomen    Known cholecystitis may be playing a part in her upper abd pain. Non-surgical abdomen. She is comfortable with home observation. Discussed possibility of kidney stone but her back pain sounds MSK to me.  Labs Reviewed  POCT URINALYSIS DIP (MANUAL ENTRY) - Abnormal; Notable for the following components:      Result Value   Bilirubin, UA small (*)    Spec Grav, UA >=1.030 (*)    Blood, UA trace-intact (*)    All other components within normal limits  COMPREHENSIVE METABOLIC PANEL  LIPASE, BLOOD  CBC WITH DIFFERENTIAL/PLATELET   CBC, lipase, CMP pending.  Meds ordered this encounter  Medications   ketorolac (TORADOL) 30 MG/ML injection 30 mg     Discharge Instructions      You have been seen today for abdominal pain. Your evaluation was not suggestive of any emergent condition requiring medical intervention at this time. However, some abdominal problems make take more time to appear. Therefore, it is very important for you to pay attention to any new symptoms or worsening of your current condition.  Please return here or to the Emergency Department immediately should you begin to feel worse in any way or have any of the following symptoms: increasing or different abdominal pain, persistent vomiting, inability to drink fluids, fevers, or shaking chills.   Meds ordered this encounter  Medications   ketorolac (TORADOL) 30 MG/ML injection 30 mg    You have had labs (blood work) sent today. We will call you with any significant abnormalities or if there is need to begin or change treatment or pursue further follow up.  You may also review your test results online through [place]. If you do not have a MyChart account, instructions to sign up should be on your discharge paperwork.     Follow-up Information      [facility] EMERGENCY DEPARTMENT.   Specialty: Emergency Medicine Why: If symptoms worsen in any way. Contact information: [address] [id number] [place] [address] [phone]               Reviewed expectations re: course of current medical issues. Questions answered. Outlined signs and symptoms indicating need for more acute intervention. Patient verbalized understanding. After Visit Summary given.   SUBJECTIVE: History from: patient. Misty Richardson is a 66 y.o. female who low back pain, and generalized, more upper though, abd pain; x 1-2 days; grad onset.  Mild urinary freq. No dysuria. No hematuria. Pt reports history of similar in the past and states had abdominal ultrasound on 5/31 that resulted in being diagnosed with kidney stone in left kidney, gall stones with cholecystitis. Was referred to general surgeon and reports has appt next Tuesday.  Afebrile. No specific aggravating or alleviating factors reported. Appetite slightly decreased. Ambulatory here. Normal bowel habits.  No LMP recorded. Patient has had a hysterectomy.  Past Surgical History:  Procedure Laterality Date   ABDOMINAL HYSTERECTOMY     APPENDECTOMY     BONE BIOPSY Left 03/25/14   BONE MARROW ASPIRATION Left 03/25/14   BONE MARROW BIOPSY Left March 2016   CESAREAN SECTION       OBJECTIVE:  Vitals:   07/04/21 1449  BP: 106/67  Pulse: 72  Resp: 18  Temp: 98.4 F (36.9 C)  TempSrc: Oral  SpO2: 92%  General appearance: alert, oriented, no acute distress HEENT: [place]; AT; oropharynx moist Lungs: unlabored respirations Abdomen: soft; without distention; mild  and poorly localized tenderness to palpation over upper abd (mid to R) ; normal bowel sounds; without masses or organomegaly; without guarding or rebound tenderness Back: without reported CVA tenderness; FROM at waist; mild soreness over lumbar paraspinal musculature Extremities: without LE edema; symmetrical;  without gross deformities Skin: warm and dry Neurologic: normal gait Psychological: alert and cooperative; normal mood and affect  Labs: Results for orders placed or performed during the hospital encounter of 07/04/21  POCT urinalysis dipstick  Result Value Ref Range   Color, UA yellow yellow   Clarity, UA clear clear   Glucose, UA negative negative mg/dL   Bilirubin, UA small (A) negative   Ketones, POC UA negative negative mg/dL   Spec Grav, UA >=1.030 (A) 1.010 - 1.025   Blood, UA trace-intact (A) negative   pH, UA 5.5 5.0 - 8.0   Protein Ur, POC negative negative mg/dL   Urobilinogen, UA 0.2 0.2 or 1.0 E.U./dL   Nitrite, UA Negative Negative   Leukocytes, UA Negative Negative   Labs Reviewed  POCT URINALYSIS DIP (MANUAL ENTRY) - Abnormal; Notable for the following components:      Result Value   Bilirubin, UA small (*)    Spec Grav, UA >=1.030 (*)    Blood, UA trace-intact (*)    All other components within normal limits  COMPREHENSIVE METABOLIC PANEL  LIPASE, BLOOD  CBC WITH DIFFERENTIAL/PLATELET     Allergies  Allergen Reactions   Avelox [Moxifloxacin Hcl In Nacl] Rash                                               Past Medical History:  Diagnosis Date   COPD (chronic obstructive pulmonary disease) (HCC)    MGUS (monoclonal gammopathy of unknown significance)    Osteoporosis     Social History   Socioeconomic History   Marital status: Divorced    Spouse name: Not on file   Number of children: Not on file   Years of education: Not on file   Highest education level: Not on file  Occupational History   Not on file  Tobacco Use   Smoking status: Every Day    Packs/day: 0.50    Types: Cigarettes    Last attempt to quit: 07/11/2017    Years since quitting: 3.9   Smokeless tobacco: Former    Quit date: 07/31/2012  Vaping Use   Vaping Use: Never used  Substance and Sexual Activity   Alcohol use: Yes    Comment: occasionally   Drug use: No   Sexual  activity: Not on file  Other Topics Concern   Not on file  Social History Narrative   Not on file   Social Determinants of Health   Financial Resource Strain: Not on file  Food Insecurity: Not on file  Transportation Needs: Not on file  Physical Activity: Not on file  Stress: Not on file  Social Connections: Not on file  Intimate Partner Violence: Not on file    Family History  Problem Relation Age of Onset   Heart failure Father    Arthritis Mother      Vanessa Kick, MD 07/06/21 1129

## 2021-07-07 DIAGNOSIS — R1010 Upper abdominal pain, unspecified: Secondary | ICD-10-CM | POA: Diagnosis not present

## 2021-07-08 LAB — CBC WITH DIFFERENTIAL/PLATELET
Basophils Absolute: 0.1 10*3/uL (ref 0.0–0.2)
Basos: 1 %
EOS (ABSOLUTE): 0.4 10*3/uL (ref 0.0–0.4)
Eos: 5 %
Hematocrit: 43.1 % (ref 34.0–46.6)
Hemoglobin: 14.7 g/dL (ref 11.1–15.9)
Immature Grans (Abs): 0 10*3/uL (ref 0.0–0.1)
Immature Granulocytes: 0 %
Lymphocytes Absolute: 2.7 10*3/uL (ref 0.7–3.1)
Lymphs: 35 %
MCH: 31.1 pg (ref 26.6–33.0)
MCHC: 34.1 g/dL (ref 31.5–35.7)
MCV: 91 fL (ref 79–97)
Monocytes Absolute: 0.5 10*3/uL (ref 0.1–0.9)
Monocytes: 6 %
Neutrophils Absolute: 4.1 10*3/uL (ref 1.4–7.0)
Neutrophils: 53 %
Platelets: 307 10*3/uL (ref 150–450)
RBC: 4.72 x10E6/uL (ref 3.77–5.28)
RDW: 12.8 % (ref 11.7–15.4)
WBC: 7.7 10*3/uL (ref 3.4–10.8)

## 2021-07-08 LAB — COMPREHENSIVE METABOLIC PANEL

## 2021-07-09 ENCOUNTER — Other Ambulatory Visit (HOSPITAL_COMMUNITY): Payer: Self-pay | Admitting: Surgery

## 2021-07-09 ENCOUNTER — Other Ambulatory Visit: Payer: Self-pay | Admitting: Surgery

## 2021-07-09 DIAGNOSIS — R1010 Upper abdominal pain, unspecified: Secondary | ICD-10-CM

## 2021-07-13 ENCOUNTER — Encounter: Payer: Self-pay | Admitting: Internal Medicine

## 2021-07-15 DIAGNOSIS — J209 Acute bronchitis, unspecified: Secondary | ICD-10-CM | POA: Diagnosis not present

## 2021-07-15 DIAGNOSIS — J441 Chronic obstructive pulmonary disease with (acute) exacerbation: Secondary | ICD-10-CM | POA: Diagnosis not present

## 2021-07-15 DIAGNOSIS — M810 Age-related osteoporosis without current pathological fracture: Secondary | ICD-10-CM | POA: Diagnosis not present

## 2021-07-15 DIAGNOSIS — I700 Atherosclerosis of aorta: Secondary | ICD-10-CM | POA: Diagnosis not present

## 2021-07-15 DIAGNOSIS — R69 Illness, unspecified: Secondary | ICD-10-CM | POA: Diagnosis not present

## 2021-07-15 DIAGNOSIS — F411 Generalized anxiety disorder: Secondary | ICD-10-CM | POA: Diagnosis not present

## 2021-07-15 DIAGNOSIS — Z681 Body mass index (BMI) 19 or less, adult: Secondary | ICD-10-CM | POA: Diagnosis not present

## 2021-07-17 ENCOUNTER — Ambulatory Visit: Payer: Medicare HMO | Admitting: Student

## 2021-07-17 ENCOUNTER — Encounter: Payer: Self-pay | Admitting: *Deleted

## 2021-07-17 ENCOUNTER — Encounter: Payer: Self-pay | Admitting: Student

## 2021-07-17 VITALS — BP 128/82 | HR 80 | Ht 61.0 in | Wt 90.4 lb

## 2021-07-17 DIAGNOSIS — E785 Hyperlipidemia, unspecified: Secondary | ICD-10-CM | POA: Diagnosis not present

## 2021-07-17 DIAGNOSIS — I2510 Atherosclerotic heart disease of native coronary artery without angina pectoris: Secondary | ICD-10-CM | POA: Diagnosis not present

## 2021-07-17 DIAGNOSIS — R1013 Epigastric pain: Secondary | ICD-10-CM

## 2021-07-17 NOTE — Patient Instructions (Signed)
Medication Instructions:  Your physician recommends that you continue on your current medications as directed. Please refer to the Current Medication list given to you today.  *If you need a refill on your cardiac medications before your next appointment, please call your pharmacy*   Lab Work: NONE   If you have labs (blood work) drawn today and your tests are completely normal, you will receive your results only by: [place] (if you have MyChart) OR A paper copy in the mail If you have any lab test that is abnormal or we need to change your treatment, we will call you to review the results.   Testing/Procedures: NONE    Follow-Up: At [facility], you and your health needs are our priority.  As part of our continuing mission to provide you with exceptional heart care, we have created designated Provider Care Teams.  These Care Teams include your primary Cardiologist (physician) and Advanced Practice Providers (APPs -  Physician Assistants and Nurse Practitioners) who all work together to provide you with the care you need, when you need it.  We recommend signing up for the patient portal called "MyChart".  Sign up information is provided on this After Visit Summary.  MyChart is used to connect with patients for Virtual Visits (Telemedicine).  Patients are able to view lab/test results, encounter notes, upcoming appointments, etc.  Non-urgent messages can be sent to your provider as well.   To learn more about what you can do with MyChart, go to [email or web address].    Your next appointment:   1 year(s)  The format for your next appointment:   In Person  Provider:   You may see Rozann Lesches, MD or one of the following Advanced Practice Providers on your designated Care Team:   Bernerd Pho, PA-C  Ermalinda Barrios, PA-C     Other Instructions Thank you for choosing [facility]!    Important Information About [place]

## 2021-07-17 NOTE — Progress Notes (Signed)
Cardiology Office Note    Date:  07/17/2021   ID:  Misty, Richardson 12-31-1955, MRN [id number]  PCP:  Redmond School, MD  Cardiologist: Rozann Lesches, MD    Chief Complaint  Patient presents with   Follow-up    Annual Visit    History of Present Illness:    Misty Richardson is a 66 y.o. female with past medical history of coronary calcification by CT, COPD and MGUS who presents to the office today for annual follow-up.  She was last examined by Dr. Domenic Polite in 03/2020 as a new patient referral after being found to have coronary calcification on a lung cancer screening CT. She reported having dyspnea on exertion in the setting of COPD but denied any recent chest pain. She was started on ASA 81 mg daily and labs were requested from [place] with plans to start statin therapy. Was recommended if she developed any exertional symptoms over time, would pursue further ischemic testing.  In talking with the patient today, she reports she has been having epigastric pain which is typically occurring after food consumption. She is being followed by GI and has plans for a hepatic scan next month. Says she feels bloated when this occurs and symptoms can improve with belching. This is not associated with exertion or positional changes. She denies any recent chest pain or palpitations. She does have dyspnea on exertion in the setting of COPD but no acute changes in this. No recent orthopnea, PND or pitting edema. She thought that her visit for today was to have a stress test performed at the time of this visit.   Past Medical History:  Diagnosis Date   COPD (chronic obstructive pulmonary disease) (HCC)    MGUS (monoclonal gammopathy of unknown significance)    Osteoporosis     Past Surgical History:  Procedure Laterality Date   ABDOMINAL HYSTERECTOMY     APPENDECTOMY     BONE BIOPSY Left 03/25/14   BONE MARROW ASPIRATION Left 03/25/14   BONE MARROW BIOPSY Left March 2016   CESAREAN  SECTION      Current Medications: Outpatient Medications Prior to Visit  Medication Sig Dispense Refill   albuterol (PROVENTIL HFA;VENTOLIN HFA) 108 (90 BASE) MCG/ACT inhaler Inhale 2 puffs into the lungs every 6 (six) hours as needed for wheezing or shortness of breath. Reported on 02/06/2015     albuterol (PROVENTIL) (2.5 MG/3ML) 0.083% nebulizer solution Take 2.5 mg by nebulization every 6 (six) hours as needed for wheezing or shortness of breath.     alendronate (FOSAMAX) 70 MG tablet TAKE ONE TABLET ($RemoveBef'70MG'sxZfeyZoCI$  TOTAL) BY MOUTH ONCE A WEEK 12 tablet 1   ALPRAZolam (XANAX) 1 MG tablet Take 1 mg by mouth at bedtime as needed.     aspirin EC 81 MG tablet Take 1 tablet (81 mg total) by mouth daily. Swallow whole. 90 tablet 3   azithromycin (ZITHROMAX) 250 MG tablet Take first 2 tablets together, then 1 every day until finished. 6 tablet 0   budesonide-formoterol (SYMBICORT) 160-4.5 MCG/ACT inhaler Inhale 2 puffs into the lungs 2 (two) times daily. 1 Inhaler 1   Calcium Carbonate-Vitamin D 600-200 MG-UNIT TABS Take 1 tablet by mouth 2 (two) times daily.     cholecalciferol (VITAMIN D3) 25 MCG (1000 UNIT) tablet Take 1,000 Units by mouth daily.     ibuprofen (ADVIL,MOTRIN) 600 MG tablet Take 600 mg by mouth as needed.     predniSONE (DELTASONE) 20 MG tablet Take 2 tablets (40 mg  total) by mouth daily with breakfast. 10 tablet 0   rosuvastatin (CRESTOR) 5 MG tablet TAKE ONE TABLET (5 MG TOTAL) BY MOUTH DAILY. 100 tablet 2   No facility-administered medications prior to visit.     Allergies:   Avelox [moxifloxacin hcl in nacl]   Social History   Socioeconomic History   Marital status: Divorced    Spouse name: Not on file   Number of children: Not on file   Years of education: Not on file   Highest education level: Not on file  Occupational History   Not on file  Tobacco Use   Smoking status: Every Day    Packs/day: 0.50    Types: Cigarettes    Last attempt to quit: 07/11/2017    Years  since quitting: 4.0   Smokeless tobacco: Former    Quit date: 07/31/2012  Vaping Use   Vaping Use: Never used  Substance and Sexual Activity   Alcohol use: Yes    Comment: occasionally   Drug use: No   Sexual activity: Not on file  Other Topics Concern   Not on file  Social History Narrative   Not on file   Social Determinants of Health   Financial Resource Strain: Not on file  Food Insecurity: Not on file  Transportation Needs: Not on file  Physical Activity: Not on file  Stress: Not on file  Social Connections: Not on file     Family History:  The patient's family history includes Arthritis in her mother; Heart failure in her father.   Review of Systems:    Please see the history of present illness.     All other systems reviewed and are otherwise negative except as noted above.   Physical Exam:    VS:  BP 128/82   Pulse 80   Ht $R'5\' 1"'tu$  (1.549 m)   Wt 90 lb 6.4 oz (41 kg)   SpO2 95%   BMI 17.08 kg/m    General: Well developed, well nourished,female appearing in no acute distress. Head: Normocephalic, atraumatic. Neck: No carotid bruits. JVD not elevated.  Lungs: Respirations regular and unlabored, without wheezes or rales.  Heart: Regular rate and rhythm. No S3 or S4.  No murmur, no rubs, or gallops appreciated. Abdomen: Appears non-distended. No obvious abdominal masses. Msk:  Strength and tone appear normal for age. No obvious joint deformities or effusions. Extremities: No clubbing or cyanosis. No pitting edema.  Distal pedal pulses are 2+ bilaterally. Neuro: Alert and oriented X 3. Moves all extremities spontaneously. No focal deficits noted. Psych:  Responds to questions appropriately with a normal affect. Skin: No rashes or lesions noted  Wt Readings from Last 3 Encounters:  07/17/21 90 lb 6.4 oz (41 kg)  04/07/21 95 lb 0.3 oz (43.1 kg)  12/30/20 95 lb 3.8 oz (43.2 kg)     Studies/Labs Reviewed:   EKG:  EKG is ordered today.  The ekg ordered today  demonstrates NSR, HR 83 with no acute ST changes.   Recent Labs: 07/04/2021: ALT CANCELED; BUN CANCELED; Creatinine, Ser CANCELED; Hemoglobin 14.7; Platelets 307; Potassium CANCELED; Sodium CANCELED   Lipid Panel    Component Value Date/Time   CHOL 146 07/02/2020 1147   TRIG 119 07/02/2020 1147   HDL 49 07/02/2020 1147   CHOLHDL 3.0 07/02/2020 1147   LDLCALC 76 07/02/2020 1147    Additional studies/ records that were reviewed today include:   Lung Cancer Screening Chest CT: 03/2021 FINDINGS: Cardiovascular: Heart size is normal. There  is no significant pericardial fluid, thickening or pericardial calcification. There is aortic atherosclerosis, as well as atherosclerosis of the great vessels of the mediastinum and the coronary arteries, including calcified atherosclerotic plaque in the left main coronary artery.   Mediastinum/Nodes: No pathologically enlarged mediastinal or hilar lymph nodes. Please note that accurate exclusion of hilar adenopathy is limited on noncontrast CT scans. Esophagus is unremarkable in appearance. No axillary lymphadenopathy.   Lungs/Pleura: Tiny pulmonary nodules are again noted in the lungs, largest of which is in the periphery of the left upper lobe (axial image 80 of series 3), with a volume derived mean diameter of only 1.8 mm. No larger more suspicious appearing pulmonary nodules or masses are noted. No acute consolidative airspace disease. No pleural effusions. Mild diffuse bronchial wall thickening with mild centrilobular and paraseptal emphysema. Left-sided Bochdalek's hernia incidentally noted.   Upper Abdomen: Aortic atherosclerosis.   Musculoskeletal: Chronic compression fracture of T7 with 40% loss of anterior vertebral body height, similar to the prior study. There are no aggressive appearing lytic or blastic lesions noted in the visualized portions of the skeleton.   IMPRESSION: 1. Lung-RADS 2S, benign appearance or behavior.  Continue annual screening with low-dose chest CT without contrast in 12 months. 2. The "S" modifier above refers to potentially clinically significant non lung cancer related findings. Specifically, there is aortic atherosclerosis, in addition to left main coronary artery disease. Please note that although the presence of coronary artery calcium documents the presence of coronary artery disease, the severity of this disease and any potential stenosis cannot be assessed on this non-gated CT examination. Assessment for potential risk factor modification, dietary therapy or pharmacologic therapy may be warranted, if clinically indicated. 3. Mild diffuse bronchial wall thickening with mild centrilobular and paraseptal emphysema; imaging findings suggestive of underlying COPD.   Aortic Atherosclerosis (ICD10-I70.0) and Emphysema (ICD10-J43.9).     Assessment:    1. Coronary artery calcification seen on CT scan   2. Hyperlipidemia LDL goal <70   3. Epigastric pain      Plan:   In order of problems listed above:  1. Coronary Calcification on CT - Her recent abdominal pain is atypical for a cardiac etiology as it occurs after food consumption and she feels bloated at that time and is not associated with exertion. She does have known coronary calcification by prior CT and has remained on ASA 81 mg daily along with Crestor 5 mg daily. - We did review options for ischemic evaluation including a Coronary CT or Lexiscan Myoview but she wishes to hold off on further testing at this time given all of her upcoming abdominal studies which seems reasonable given no recent cardiac issues. I encouraged her to reach out to make [place] aware if she develops any symptoms in the interim which would warrant testing prior to her next visit.  2. HLD - Will request a copy of most recent labs. She remains on Crestor 5 mg daily.  3. Epigastric Pain - This occurs after food consumption in the setting of  significant bloating at that time. She is being followed by GI with plans for a hepatic study next month.    Medication Adjustments/Labs and Tests Ordered: Current medicines are reviewed at length with the patient today.  Concerns regarding medicines are outlined above.  Medication changes, Labs and Tests ordered today are listed in the Patient Instructions below. Patient Instructions  Medication Instructions:  Your physician recommends that you continue on your current medications as directed. Please  refer to the Current Medication list given to you today.  *If you need a refill on your cardiac medications before your next appointment, please call your pharmacy*   Lab Work: NONE   If you have labs (blood work) drawn today and your tests are completely normal, you will receive your results only by: [place] (if you have MyChart) OR A paper copy in the mail If you have any lab test that is abnormal or we need to change your treatment, we will call you to review the results.   Testing/Procedures: NONE    Follow-Up: At [facility], you and your health needs are our priority.  As part of our continuing mission to provide you with exceptional heart care, we have created designated Provider Care Teams.  These Care Teams include your primary Cardiologist (physician) and Advanced Practice Providers (APPs -  Physician Assistants and Nurse Practitioners) who all work together to provide you with the care you need, when you need it.  We recommend signing up for the patient portal called "MyChart".  Sign up information is provided on this After Visit Summary.  MyChart is used to connect with patients for Virtual Visits (Telemedicine).  Patients are able to view lab/test results, encounter notes, upcoming appointments, etc.  Non-urgent messages can be sent to your provider as well.   To learn more about what you can do with MyChart, go to [email or web address].    Your next  appointment:   1 year(s)  The format for your next appointment:   In Person  Provider:   You may see Rozann Lesches, MD or one of the following Advanced Practice Providers on your designated Care Team:   Bernerd Pho, PA-C  Ermalinda Barrios, PA-C     Other Instructions Thank you for choosing [facility]!    Important Information About [place]         Signed, Erma Heritage, PA-C  07/17/2021 4:52 PM    [facility] [address] [place], [place] [address] Phone: [phone] Fax: [phone]

## 2021-08-03 ENCOUNTER — Ambulatory Visit (HOSPITAL_COMMUNITY)
Admission: RE | Admit: 2021-08-03 | Discharge: 2021-08-03 | Disposition: A | Discharge status: Home / Self Care | Payer: Medicare HMO | Source: Ambulatory Visit | Attending: Surgery | Admitting: Surgery

## 2021-08-03 DIAGNOSIS — R1011 Right upper quadrant pain: Secondary | ICD-10-CM | POA: Diagnosis not present

## 2021-08-03 DIAGNOSIS — R1010 Upper abdominal pain, unspecified: Secondary | ICD-10-CM | POA: Insufficient documentation

## 2021-08-03 DIAGNOSIS — R140 Abdominal distension (gaseous): Secondary | ICD-10-CM | POA: Diagnosis not present

## 2021-08-03 MED ORDER — TECHNETIUM TC 99M MEBROFENIN IV KIT
5.1000 | PACK | Freq: Once | INTRAVENOUS | Status: AC | PRN
  Administered 2021-08-03: 5.1 via INTRAVENOUS

## 2021-08-13 ENCOUNTER — Ambulatory Visit: Payer: Medicare HMO | Admitting: Internal Medicine

## 2021-08-13 ENCOUNTER — Encounter: Payer: Self-pay | Admitting: Internal Medicine

## 2021-08-13 ENCOUNTER — Other Ambulatory Visit (INDEPENDENT_AMBULATORY_CARE_PROVIDER_SITE_OTHER): Payer: Medicare HMO

## 2021-08-13 VITALS — BP 106/68 | HR 87 | Ht 61.0 in | Wt 90.2 lb

## 2021-08-13 DIAGNOSIS — R1013 Epigastric pain: Secondary | ICD-10-CM

## 2021-08-13 DIAGNOSIS — K8020 Calculus of gallbladder without cholecystitis without obstruction: Secondary | ICD-10-CM | POA: Diagnosis not present

## 2021-08-13 DIAGNOSIS — Z1211 Encounter for screening for malignant neoplasm of colon: Secondary | ICD-10-CM

## 2021-08-13 DIAGNOSIS — R634 Abnormal weight loss: Secondary | ICD-10-CM

## 2021-08-13 LAB — TSH: TSH: 3.15 u[IU]/mL (ref 0.35–5.50)

## 2021-08-13 LAB — C-REACTIVE PROTEIN: CRP: <1 mg/dL (ref 0.5–20.0)

## 2021-08-13 LAB — LIPASE: Lipase: 18 U/L (ref 11.0–59.0)

## 2021-08-13 MED ORDER — NA SULFATE-K SULFATE-MG SULF 17.5-3.13-1.6 GM/177ML PO SOLN: 1.0000 | Freq: Once | ORAL | 0 refills | Status: AC

## 2021-08-13 NOTE — Progress Notes (Signed)
Chief Complaint: Bloating, abdominal discomfort  HPI : 66 year old female with history of COPD, gallstones, MGUS, osteoporosis, anxiety presents with bloating and abdominal discomfort  She has felt bloated for the last year. This has been getting worse over time. She does feel some aching and pressure in her upper abdomen. Denies N&V, dysphagia, diarrhea, constipation. She has lost about 5-6 lbs over 4 months, which has been unintentional. When she eats, she gets full really easily. Her ab discomfort tends to worsen with intake of fatty foods. She uses ibuprofen once a week. Grandmother had colon cancer and grandfather had esophageal cancer. She currently smokes a few cigarettes per day. Not on any blood thinners. Denies prior EGD or colonoscopy. Her last stool test for colon cancer was at least over a year ago. She drinks one alcoholic beverage per week. She does drink a lot of sodas. Denies chest burning or regurgitation. She did establish with Dr. Bobbye Morton with general surgery who recommended that she get an EGD for further evaluation.   Wt Readings from Last 3 Encounters:  08/13/21 90 lb 3.2 oz (40.9 kg)  07/17/21 90 lb 6.4 oz (41 kg)  04/07/21 95 lb 0.3 oz (43.1 kg)    Past Medical History:  Diagnosis Date   Anxiety    COPD (chronic obstructive pulmonary disease) (HCC)    Gallstones    MGUS (monoclonal gammopathy of unknown significance)    Osteoporosis     Past Surgical History:  Procedure Laterality Date   ABDOMINAL HYSTERECTOMY     APPENDECTOMY     BONE BIOPSY Left 03/25/2014   BONE MARROW ASPIRATION Left 03/25/2014   BONE MARROW BIOPSY Left 03/19/2014   CESAREAN SECTION     x2   Family History  Problem Relation Age of Onset   Arthritis Mother    Heart failure Father    Colon cancer Maternal Grandmother    Esophageal cancer Paternal Grandfather    Stomach cancer Neg Hx    Colon polyps Neg Hx    Social History   Tobacco Use   Smoking status: Every Day     Packs/day: 0.50    Types: Cigarettes    Last attempt to quit: 07/11/2017    Years since quitting: 4.0   Smokeless tobacco: Former    Quit date: 07/31/2012  Vaping Use   Vaping Use: Never used  Substance Use Topics   Alcohol use: Yes    Comment: occasionally   Drug use: No   Current Outpatient Medications  Medication Sig Dispense Refill   albuterol (PROVENTIL HFA;VENTOLIN HFA) 108 (90 BASE) MCG/ACT inhaler Inhale 2 puffs into the lungs every 6 (six) hours as needed for wheezing or shortness of breath. Reported on 02/06/2015     albuterol (PROVENTIL) (2.5 MG/3ML) 0.083% nebulizer solution Take 2.5 mg by nebulization every 6 (six) hours as needed for wheezing or shortness of breath.     alendronate (FOSAMAX) 70 MG tablet TAKE ONE TABLET (70MG TOTAL) BY MOUTH ONCE A WEEK 12 tablet 1   ALPRAZolam (XANAX) 1 MG tablet Take 1 mg by mouth at bedtime as needed.     aspirin EC 81 MG tablet Take 1 tablet (81 mg total) by mouth daily. Swallow whole. 90 tablet 3   budesonide-formoterol (SYMBICORT) 160-4.5 MCG/ACT inhaler Inhale 2 puffs into the lungs 2 (two) times daily. 1 Inhaler 1   Calcium Carbonate-Vitamin D 600-200 MG-UNIT TABS Take 1 tablet by mouth 2 (two) times daily.     cholecalciferol (VITAMIN D3)  25 MCG (1000 UNIT) tablet Take 1,000 Units by mouth daily.     ibuprofen (ADVIL,MOTRIN) 600 MG tablet Take 600 mg by mouth as needed.     predniSONE (DELTASONE) 20 MG tablet Take 2 tablets (40 mg total) by mouth daily with breakfast. 10 tablet 0   rosuvastatin (CRESTOR) 5 MG tablet TAKE ONE TABLET (5 MG TOTAL) BY MOUTH DAILY. 100 tablet 2   azithromycin (ZITHROMAX) 250 MG tablet Take first 2 tablets together, then 1 every day until finished. (Patient not taking: Reported on 08/13/2021) 6 tablet 0   No current facility-administered medications for this visit.   Allergies  Allergen Reactions   Avelox [Moxifloxacin Hcl In Nacl] Rash   Review of Systems: All systems reviewed and negative except  where noted in HPI.   Physical Exam: BP 106/68   Pulse 87   Ht 5' 1" (1.549 m)   Wt 90 lb 3.2 oz (40.9 kg)   SpO2 95%   BMI 17.04 kg/m  Constitutional: Pleasant,well-developed, female in no acute distress. HEENT: Normocephalic and atraumatic. Conjunctivae are normal. No scleral icterus. Cardiovascular: Normal rate, regular rhythm.  Pulmonary/chest: Effort normal and breath sounds normal. No wheezing, rales or rhonchi. Abdominal: Soft, nondistended, slightly uncomfortable in the epigastric area. Bowel sounds active throughout. There are no masses palpable. No hepatomegaly. Extremities: No edema Neurological: Alert and oriented to person place and time. Skin: Skin is warm and dry. No rashes noted. Psychiatric: Normal mood and affect. Behavior is normal.  Labs 03/2021: CMP unremarkable.  Labs 06/2021: CBC unremarkable.  Ab U/S 06/16/21: IMPRESSION: Cholelithiasis without secondary signs of acute cholecystitis. Possible 4 mm left renal stone.  HIDA scan 08/03/21: IMPRESSION: 1.  Patent cystic and common bile ducts. 2.  Normal gallbladder ejection fraction.  ASSESSMENT AND PLAN: Dyspepsia Epigastric ab discomfort Weight loss Colon cancer screening Gallstones Patient presents with dyspepsia and epigastric ab discomfort that has been worsening over the last year. The patient does note that the discomfort seems to worsen with intake of fatty foods so patient could be suffering from biliary colic. Prior ab U/S did show signs of gallstones. Patient has seen general surgery who recommended a HIDA scan that was normal and a follow up EGD to rule out alternative etiologies of her dyspepsia and abdominal discomfort. Will plan to obtain some labs and plan for EGD to rule out PUD, gastritis/duodenitis, and malignancy. Since patient is due for colon cancer screening, will add on a colonoscopy to her EGD procedure. - Try to reduce soda intake - Check lipase, TTG IgA, IgA, CRP, TSH -  EGD/colonoscopy [facility]  Christia Reading, MD  I spent 60 minutes of time, including in depth chart review, independent review of results as outlined above, communicating results with the patient directly, face-to-face time with the patient, coordinating care, ordering studies and medications as appropriate, and documentation.

## 2021-08-13 NOTE — Patient Instructions (Addendum)
If you are age 66 or older, your body mass index should be between 23-30. Your Body mass index is 17.04 kg/m. If this is out of the aforementioned range listed, please consider follow up with your Primary Care Provider.  If you are age 22 or younger, your body mass index should be between 19-25. Your Body mass index is 17.04 kg/m. If this is out of the aformentioned range listed, please consider follow up with your Primary Care Provider.   You have been scheduled for an endoscopy and colonoscopy. Please follow the written instructions given to you at your visit today. Please pick up your prep supplies at the pharmacy within the next 1-3 days. If you use inhalers (even only as needed), please bring them with you on the day of your procedure.   Your provider has requested that you go to the basement level for lab work before leaving today. Press "B" on the elevator. The lab is located at the first door on the left as you exit the elevator.   The [facility] providers would like to encourage you to use [facility] to communicate with providers for non-urgent requests or questions.  Due to long hold times on the telephone, sending your provider a message by [facility] may be a faster and more efficient way to get a response.  Please allow 48 business hours for a response.  Please remember that this is for non-urgent requests.   Due to recent changes in healthcare laws, you may see the results of your imaging and laboratory studies on MyChart before your provider has had a chance to review them.  We understand that in some cases there may be results that are confusing or concerning to you. Not all laboratory results come back in the same time frame and the provider may be waiting for multiple results in order to interpret others.  Please give [place] 48 hours in order for your provider to thoroughly review all the results before contacting the office for clarification of your results.

## 2021-08-14 ENCOUNTER — Ambulatory Visit
Admission: EM | Admit: 2021-08-14 | Discharge: 2021-08-14 | Disposition: A | Discharge status: Home / Self Care | Payer: Medicare HMO | Attending: Nurse Practitioner | Admitting: Nurse Practitioner

## 2021-08-14 DIAGNOSIS — J441 Chronic obstructive pulmonary disease with (acute) exacerbation: Secondary | ICD-10-CM

## 2021-08-14 MED ORDER — METHYLPREDNISOLONE SODIUM SUCC 125 MG IJ SOLR
60.0000 mg | Freq: Once | INTRAMUSCULAR | Status: AC
  Administered 2021-08-14: 60 mg via INTRAMUSCULAR

## 2021-08-14 MED ORDER — IPRATROPIUM-ALBUTEROL 0.5-2.5 (3) MG/3ML IN SOLN
3.0000 mL | Freq: Once | RESPIRATORY_TRACT | Status: AC
  Administered 2021-08-14: 3 mL via RESPIRATORY_TRACT

## 2021-08-14 MED ORDER — PREDNISONE 20 MG PO TABS: 40.0000 mg | ORAL_TABLET | Freq: Every day | ORAL | 0 refills | Status: AC

## 2021-08-14 MED ORDER — DOXYCYCLINE HYCLATE 100 MG PO CAPS: 100.0000 mg | ORAL_CAPSULE | Freq: Two times a day (BID) | ORAL | 0 refills | Status: AC

## 2021-08-14 NOTE — Discharge Instructions (Signed)
-   Please start using only the nebulizer every 4-6 hours as needed for wheezing/shortness of breath for the next few days - We have given you a breathing treatment and a shot of steroid -Please start the antibiotic today and the prednisone tomorrow morning -Seek care in emergency room if your symptoms worsen or you develop shortness of breath despite the use of your nebulizer machine

## 2021-08-14 NOTE — ED Triage Notes (Signed)
Pt reports cough and shortness of breath x 2-3 days. Inhaler and nebulizer gives some relief. Pt reports her hemoglobin is low, from lab form yesterday at the GI.

## 2021-08-14 NOTE — ED Provider Notes (Addendum)
[facility]    CSN: [id number] Arrival date & time: 08/14/21  1048      History   Chief Complaint Chief Complaint  Patient presents with   Shortness of Breath        Cough    HPI ARBELL WYCOFF is a 66 y.o. female.   Patient presents with cough and shortness of breath for the past 2 to 3 days.  She denies fevers, body aches, chills, sore throat, ear pain or pressure, chest pain, chest tightness, abdominal pain, nausea/vomiting, diarrhea, and fatigue.  She has been taking rescue inhaler/nebulizer which does help temporarily.  She is also concerned about her blood work from the gastroenterologist yesterday-reports she has a low hemoglobin.  Medical history significant for osteoporosis, COPD, anxiety, MGUS.  She was last treated for COPD exacerbation approximately 3 months ago.  Reports she improved all the way after the treatment.  Reports she is compliant with her Symbicort inhaler.    Past Medical History:  Diagnosis Date   Anxiety    COPD (chronic obstructive pulmonary disease) (HCC)    Gallstones    MGUS (monoclonal gammopathy of unknown significance)    Osteoporosis     Patient Active Problem List   Diagnosis Date Noted   Osteoporosis 03/14/2014   Tobacco use disorder 09/19/2012   MGUS (monoclonal gammopathy of unknown significance) 09/05/2012   COPD (chronic obstructive pulmonary disease) ([place]) 09/05/2012    Past Surgical History:  Procedure Laterality Date   ABDOMINAL HYSTERECTOMY     APPENDECTOMY     BONE BIOPSY Left 03/25/2014   BONE MARROW ASPIRATION Left 03/25/2014   BONE MARROW BIOPSY Left 03/19/2014   CESAREAN SECTION     x2    OB History     Gravida  2   Para  2   Term      Preterm      AB      Living  2      SAB      IAB      Ectopic      Multiple      Live Births               Home Medications    Prior to Admission medications   Medication Sig Start Date End Date Taking? Authorizing Provider   doxycycline (VIBRAMYCIN) 100 MG capsule Take 1 capsule (100 mg total) by mouth 2 (two) times daily for 7 days. 08/14/21 08/21/21 Yes Eulogio Bear, NP  albuterol (PROVENTIL HFA;VENTOLIN HFA) 108 (90 BASE) MCG/ACT inhaler Inhale 2 puffs into the lungs every 6 (six) hours as needed for wheezing or shortness of breath. Reported on 02/06/2015    [provider]  albuterol (PROVENTIL) (2.5 MG/3ML) 0.083% nebulizer solution Take 2.5 mg by nebulization every 6 (six) hours as needed for wheezing or shortness of breath.    [provider]  alendronate (FOSAMAX) 70 MG tablet TAKE ONE TABLET (70MG TOTAL) BY MOUTH ONCE A WEEK 03/30/21   Pennington, Rebekah M, PA-C  ALPRAZolam Duanne Moron) 1 MG tablet Take 1 mg by mouth at bedtime as needed. 12/02/16   [provider]  aspirin EC 81 MG tablet Take 1 tablet (81 mg total) by mouth daily. Swallow whole. 03/27/20   Satira Sark, MD  budesonide-formoterol [facility]) 160-4.5 MCG/ACT inhaler Inhale 2 puffs into the lungs 2 (two) times daily. 09/23/12   Doree Albee, MD  Calcium Carbonate-Vitamin D 600-200 MG-UNIT TABS Take 1 tablet by mouth 2 (  two) times daily.    [provider]  cholecalciferol (VITAMIN D3) 25 MCG (1000 UNIT) tablet Take 1,000 Units by mouth daily.    [provider]  ibuprofen (ADVIL,MOTRIN) 600 MG tablet Take 600 mg by mouth as needed. 09/26/17   [provider]  predniSONE (DELTASONE) 20 MG tablet Take 2 tablets (40 mg total) by mouth daily with breakfast for 5 days. 08/14/21 08/19/21  Martinez, Jessica A, NP  rosuvastatin (CRESTOR) 5 MG tablet TAKE ONE TABLET (5 MG TOTAL) BY MOUTH DAILY. 09/02/20   McDowell, Samuel G, MD    Family History Family History  Problem Relation Age of Onset   Arthritis Mother    Heart failure Father    Colon cancer Maternal Grandmother    Esophageal cancer Paternal Grandfather    Stomach cancer Neg Hx    Colon polyps Neg Hx     Social History Social History    Tobacco Use   Smoking status: Every Day    Packs/day: 0.50    Types: Cigarettes    Last attempt to quit: 07/11/2017    Years since quitting: 4.0   Smokeless tobacco: Former    Quit date: 07/31/2012  Vaping Use   Vaping Use: Never used  Substance Use Topics   Alcohol use: Yes    Comment: occasionally   Drug use: No     Allergies   Avelox [moxifloxacin hcl in nacl]   Review of Systems Review of Systems Per HPI  Physical Exam Triage Vital Signs ED Triage Vitals  Enc Vitals Group     BP 08/14/21 1102 122/82     Pulse Rate 08/14/21 1102 94     Resp 08/14/21 1102 (!) 26     Temp 08/14/21 1102 97.8 F (36.6 C)     Temp Source 08/14/21 1102 Oral     SpO2 08/14/21 1102 93 %     Weight --      Height --      Head Circumference --      Peak Flow --      Pain Score 08/14/21 1100 0     Pain Loc --      Pain Edu? --      Excl. in GC? --    No data found.  Updated Vital Signs BP 122/82 (BP Location: Right Arm)   Pulse 94   Temp 97.8 F (36.6 C) (Oral)   Resp (!) 26   SpO2 93%   Visual Acuity Right Eye Distance:   Left Eye Distance:   Bilateral Distance:    Right Eye Near:   Left Eye Near:    Bilateral Near:     Physical Exam Vitals and nursing note reviewed.  Constitutional:      General: She is not in acute distress.    Appearance: She is well-developed. She is not toxic-appearing.  HENT:     Head: Normocephalic and atraumatic.     Mouth/Throat:     Mouth: Mucous membranes are moist.     Pharynx: Oropharynx is clear.  Cardiovascular:     Rate and Rhythm: Normal rate and regular rhythm.  Pulmonary:     Effort: Pulmonary effort is normal. Tachypnea present. No respiratory distress.     Breath sounds: Examination of the right-upper field reveals wheezing. Examination of the left-upper field reveals wheezing. Examination of the right-middle field reveals wheezing. Examination of the left-middle field reveals wheezing. Examination of the right-lower field  reveals wheezing. Examination of the left-lower field reveals   wheezing. Wheezing present.  Chest:     Chest wall: No tenderness.  Abdominal:     Palpations: Abdomen is soft.  Musculoskeletal:     Cervical back: Normal range of motion.     Right lower leg: No tenderness. No edema.     Left lower leg: No tenderness. No edema.  Lymphadenopathy:     Cervical: No cervical adenopathy.  Skin:    General: Skin is warm and dry.     Capillary Refill: Capillary refill takes less than 2 seconds.     Coloration: Skin is not cyanotic or pale.     Findings: No erythema.  Neurological:     Mental Status: She is alert and oriented to person, place, and time.  Psychiatric:        Behavior: Behavior is cooperative.      UC Treatments / Results  Labs (all labs ordered are listed, but only abnormal results are displayed) Labs Reviewed - No data to display  EKG   Radiology No results found.  Procedures Procedures (including critical care time)  Medications Ordered in UC Medications  ipratropium-albuterol (DUONEB) 0.5-2.5 (3) MG/3ML nebulizer solution 3 mL (3 mLs Nebulization Given 08/14/21 1130)  methylPREDNISolone sodium succinate (SOLU-MEDROL) 125 mg/2 mL injection 60 mg (60 mg Intramuscular Given 08/14/21 1144)    Initial Impression / Assessment and Plan / UC Course  I have reviewed the triage vital signs and the nursing notes.  Pertinent labs & imaging results that were available during my care of the patient were reviewed by me and considered in my medical decision making (see chart for details).    Patient is a very pleasant, well-appearing 66 year old female presenting for COPD exacerbation today.  On examination initially, she was audibly wheezing in all of her lung lobes and air movement was decreased.  DuoNeb given with significant increase in air movement, wheezing improved however she still had expiratory wheezing on exam.  We will treat for COPD exacerbation with IM Solu-Medrol  60 mg today in urgent care.  Start prednisone 40 mg daily for 5 days tomorrow.  Also start doxycycline twice daily for 7 days.  Encouraged pulmonary toilet.  Encouraged her to seek care in emergency room if symptoms worsen despite this treatment.  The patient was given the opportunity to ask questions.  All questions answered to their satisfaction.  The patient is in agreement to this plan.   We also briefly discussed her lab work-I informed her that immunoglobulin is not the same as hemoglobin and that she is not anemic. Final Clinical Impressions(s) / UC Diagnoses   Final diagnoses:  COPD exacerbation ([place])     Discharge Instructions      - Please start using only the nebulizer every 4-6 hours as needed for wheezing/shortness of breath for the next few days - We have given you a breathing treatment and a shot of steroid -Please start the antibiotic today and the prednisone tomorrow morning -Seek care in emergency room if your symptoms worsen or you develop shortness of breath despite the use of your nebulizer machine    ED Prescriptions     Medication Sig Dispense Auth. Provider   predniSONE (DELTASONE) 20 MG tablet Take 2 tablets (40 mg total) by mouth daily with breakfast for 5 days. 10 tablet Noemi Chapel A, NP   doxycycline (VIBRAMYCIN) 100 MG capsule Take 1 capsule (100 mg total) by mouth 2 (two) times daily for 7 days. 14 capsule Eulogio Bear, NP  PDMP not reviewed this encounter.   Martinez, Jessica A, NP 08/14/21 1629    Martinez, Jessica A, NP 08/14/21 1630  

## 2021-08-15 LAB — TISSUE TRANSGLUTAMINASE, IGA: (tTG) Ab, IgA: <1 U/mL

## 2021-08-15 LAB — IGA: Immunoglobulin A: 33 mg/dL — ABNORMAL LOW (ref 70–320)

## 2021-08-16 DIAGNOSIS — Z743 Need for continuous supervision: Secondary | ICD-10-CM | POA: Diagnosis not present

## 2021-08-16 DIAGNOSIS — R0600 Dyspnea, unspecified: Secondary | ICD-10-CM | POA: Diagnosis not present

## 2021-08-16 DIAGNOSIS — R000 Tachycardia, unspecified: Secondary | ICD-10-CM | POA: Diagnosis not present

## 2021-08-16 DIAGNOSIS — I10 Essential (primary) hypertension: Secondary | ICD-10-CM | POA: Diagnosis not present

## 2021-08-17 ENCOUNTER — Ambulatory Visit (HOSPITAL_COMMUNITY)
Admission: RE | Admit: 2021-08-17 | Discharge: 2021-08-17 | Disposition: A | Discharge status: Home / Self Care | Payer: Medicare HMO | Source: Ambulatory Visit | Attending: Physician Assistant | Admitting: Physician Assistant

## 2021-08-17 ENCOUNTER — Inpatient Hospital Stay (HOSPITAL_COMMUNITY): Payer: Medicare HMO | Attending: Hematology

## 2021-08-17 ENCOUNTER — Encounter (HOSPITAL_COMMUNITY): Payer: Self-pay

## 2021-08-17 ENCOUNTER — Emergency Department (HOSPITAL_COMMUNITY)
Admission: EM | Admit: 2021-08-17 | Discharge: 2021-08-17 | Disposition: A | Discharge status: Home / Self Care | Payer: Medicare HMO | Attending: Emergency Medicine | Admitting: Emergency Medicine

## 2021-08-17 ENCOUNTER — Other Ambulatory Visit: Payer: Self-pay

## 2021-08-17 ENCOUNTER — Emergency Department (HOSPITAL_COMMUNITY): Payer: Medicare HMO

## 2021-08-17 DIAGNOSIS — Z79899 Other long term (current) drug therapy: Secondary | ICD-10-CM | POA: Insufficient documentation

## 2021-08-17 DIAGNOSIS — J449 Chronic obstructive pulmonary disease, unspecified: Secondary | ICD-10-CM | POA: Diagnosis not present

## 2021-08-17 DIAGNOSIS — M546 Pain in thoracic spine: Secondary | ICD-10-CM | POA: Diagnosis not present

## 2021-08-17 DIAGNOSIS — M810 Age-related osteoporosis without current pathological fracture: Secondary | ICD-10-CM

## 2021-08-17 DIAGNOSIS — J441 Chronic obstructive pulmonary disease with (acute) exacerbation: Secondary | ICD-10-CM | POA: Diagnosis not present

## 2021-08-17 DIAGNOSIS — D472 Monoclonal gammopathy: Secondary | ICD-10-CM | POA: Insufficient documentation

## 2021-08-17 DIAGNOSIS — Z780 Asymptomatic menopausal state: Secondary | ICD-10-CM | POA: Diagnosis not present

## 2021-08-17 DIAGNOSIS — R0602 Shortness of breath: Secondary | ICD-10-CM | POA: Insufficient documentation

## 2021-08-17 LAB — CBC WITH DIFFERENTIAL/PLATELET
Abs Immature Granulocytes: 0.02 10*3/uL (ref 0.00–0.07)
Abs Immature Granulocytes: 0.02 10*3/uL (ref 0.00–0.07)
Basophils Absolute: 0 10*3/uL (ref 0.0–0.1)
Basophils Absolute: 0 10*3/uL (ref 0.0–0.1)
Basophils Relative: 0 %
Basophils Relative: 0 %
Eosinophils Absolute: 0 10*3/uL (ref 0.0–0.5)
Eosinophils Absolute: 0 10*3/uL (ref 0.0–0.5)
Eosinophils Relative: 0 %
Eosinophils Relative: 0 %
HCT: 40.1 % (ref 36.0–46.0)
HCT: 39.4 % (ref 36.0–46.0)
Hemoglobin: 13.3 g/dL (ref 12.0–15.0)
Hemoglobin: 13.3 g/dL (ref 12.0–15.0)
Immature Granulocytes: 0 %
Immature Granulocytes: 0 %
Lymphocytes Relative: 8 %
Lymphocytes Relative: 31 %
Lymphs Abs: 0.8 10*3/uL (ref 0.7–4.0)
Lymphs Abs: 2.7 10*3/uL (ref 0.7–4.0)
MCH: 31.1 pg (ref 26.0–34.0)
MCH: 31.2 pg (ref 26.0–34.0)
MCHC: 33.2 g/dL (ref 30.0–36.0)
MCHC: 33.8 g/dL (ref 30.0–36.0)
MCV: 93.9 fL (ref 80.0–100.0)
MCV: 92.5 fL (ref 80.0–100.0)
Monocytes Absolute: 0.4 10*3/uL (ref 0.1–1.0)
Monocytes Absolute: 0.6 10*3/uL (ref 0.1–1.0)
Monocytes Relative: 4 %
Monocytes Relative: 8 %
Neutro Abs: 8.2 10*3/uL — ABNORMAL HIGH (ref 1.7–7.7)
Neutro Abs: 5.2 10*3/uL (ref 1.7–7.7)
Neutrophils Relative %: 88 %
Neutrophils Relative %: 61 %
Platelets: 331 10*3/uL (ref 150–400)
Platelets: 276 10*3/uL (ref 150–400)
RBC: 4.27 MIL/uL (ref 3.87–5.11)
RBC: 4.26 MIL/uL (ref 3.87–5.11)
RDW: 13.3 % (ref 11.5–15.5)
RDW: 13.4 % (ref 11.5–15.5)
WBC: 9.4 10*3/uL (ref 4.0–10.5)
WBC: 8.5 10*3/uL (ref 4.0–10.5)
nRBC: 0 % (ref 0.0–0.2)
nRBC: 0 % (ref 0.0–0.2)

## 2021-08-17 LAB — COMPREHENSIVE METABOLIC PANEL
ALT: 26 U/L (ref 0–44)
AST: 25 U/L (ref 15–41)
Albumin: 3.9 g/dL (ref 3.5–5.0)
Alkaline Phosphatase: 50 U/L (ref 38–126)
Anion gap: 5 (ref 5–15)
BUN: 24 mg/dL — ABNORMAL HIGH (ref 8–23)
CO2: 25 mmol/L (ref 22–32)
Calcium: 9.5 mg/dL (ref 8.9–10.3)
Chloride: 111 mmol/L (ref 98–111)
Creatinine, Ser: 0.66 mg/dL (ref 0.44–1.00)
GFR, Estimated: >60 mL/min (ref >60)
Glucose, Bld: 97 mg/dL (ref 70–99)
Potassium: 3.4 mmol/L — ABNORMAL LOW (ref 3.5–5.1)
Sodium: 141 mmol/L (ref 135–145)
Total Bilirubin: 0.4 mg/dL (ref 0.3–1.2)
Total Protein: 7.5 g/dL (ref 6.5–8.1)

## 2021-08-17 LAB — LIPASE, BLOOD: Lipase: 31 U/L (ref 11–51)

## 2021-08-17 LAB — BASIC METABOLIC PANEL
Anion gap: 5 (ref 5–15)
BUN: 21 mg/dL (ref 8–23)
CO2: 26 mmol/L (ref 22–32)
Calcium: 9 mg/dL (ref 8.9–10.3)
Chloride: 107 mmol/L (ref 98–111)
Creatinine, Ser: 0.84 mg/dL (ref 0.44–1.00)
GFR, Estimated: >60 mL/min (ref >60)
Glucose, Bld: 156 mg/dL — ABNORMAL HIGH (ref 70–99)
Potassium: 3.5 mmol/L (ref 3.5–5.1)
Sodium: 138 mmol/L (ref 135–145)

## 2021-08-17 LAB — LACTATE DEHYDROGENASE: LDH: 107 U/L (ref 98–192)

## 2021-08-17 MED ORDER — KETOROLAC TROMETHAMINE 30 MG/ML IJ SOLN
30.0000 mg | Freq: Once | INTRAMUSCULAR | Status: AC
  Administered 2021-08-17: 30 mg via INTRAVENOUS
  Filled 2021-08-17: qty 1

## 2021-08-17 MED ORDER — ALBUTEROL SULFATE (2.5 MG/3ML) 0.083% IN NEBU: 2.5000 mg | INHALATION_SOLUTION | Freq: Once | RESPIRATORY_TRACT | Status: DC

## 2021-08-17 MED ORDER — ALBUTEROL SULFATE (2.5 MG/3ML) 0.083% IN NEBU
INHALATION_SOLUTION | RESPIRATORY_TRACT | Status: AC
  Administered 2021-08-17: 2.5 mg
  Filled 2021-08-17: qty 3

## 2021-08-17 MED ORDER — IPRATROPIUM-ALBUTEROL 0.5-2.5 (3) MG/3ML IN SOLN: 3.0000 mL | Freq: Once | RESPIRATORY_TRACT | Status: DC

## 2021-08-17 MED ORDER — IPRATROPIUM-ALBUTEROL 0.5-2.5 (3) MG/3ML IN SOLN
3.0000 mL | Freq: Once | RESPIRATORY_TRACT | Status: AC
Start: 2021-08-17 — End: 2021-08-17
  Administered 2021-08-17: 3 mL via RESPIRATORY_TRACT
  Filled 2021-08-17: qty 3

## 2021-08-17 MED ORDER — IPRATROPIUM-ALBUTEROL 0.5-2.5 (3) MG/3ML IN SOLN
RESPIRATORY_TRACT | Status: AC
  Administered 2021-08-17: 3 mL
  Filled 2021-08-17: qty 3

## 2021-08-17 NOTE — ED Notes (Signed)
ED Provider at bedside. 

## 2021-08-17 NOTE — ED Provider Notes (Signed)
[facility] Provider Note   CSN: [id number] Arrival date & time: 08/17/21  0005     History  Chief Complaint  Patient presents with   Shortness of Breath    Misty Richardson is a 66 y.o. female.  Patient is a 66 year old female with history of COPD and known gallstones.  Patient presenting today for evaluation of shortness of breath.  She reports being at an outdoor event this afternoon in the heat and humidity.  She then began to feel bloated and short of breath.  Ambulance was called and patient transported here.  She denies any fevers or chills.  She denies any productive cough.  She denies any chest pain.  She does describe some abdominal bloating, however this has been an ongoing issue.  She has been diagnosed with a gallstone and is currently undergoing work-up for this.  She denies to me that she is having significant abdominal discomfort.  She does report some pain in her upper back, however this also seems to be a chronic ongoing issue.  The history is provided by the patient.       Home Medications Prior to Admission medications   Medication Sig Start Date End Date Taking? Authorizing Provider  albuterol (PROVENTIL HFA;VENTOLIN HFA) 108 (90 BASE) MCG/ACT inhaler Inhale 2 puffs into the lungs every 6 (six) hours as needed for wheezing or shortness of breath. Reported on 02/06/2015    [provider]  albuterol (PROVENTIL) (2.5 MG/3ML) 0.083% nebulizer solution Take 2.5 mg by nebulization every 6 (six) hours as needed for wheezing or shortness of breath.    [provider]  alendronate (FOSAMAX) 70 MG tablet TAKE ONE TABLET ('70MG'$  TOTAL) BY MOUTH ONCE A WEEK 03/30/21   Pennington, Rebekah M, PA-C  ALPRAZolam Duanne Moron) 1 MG tablet Take 1 mg by mouth at bedtime as needed. 12/02/16   [provider]  aspirin EC 81 MG tablet Take 1 tablet (81 mg total) by mouth daily. Swallow whole. 03/27/20   Satira Sark, MD  budesonide-formoterol  [facility]) 160-4.5 MCG/ACT inhaler Inhale 2 puffs into the lungs 2 (two) times daily. 09/23/12   Doree Albee, MD  Calcium Carbonate-Vitamin D 600-200 MG-UNIT TABS Take 1 tablet by mouth 2 (two) times daily.    [provider]  cholecalciferol (VITAMIN D3) 25 MCG (1000 UNIT) tablet Take 1,000 Units by mouth daily.    [provider]  doxycycline (VIBRAMYCIN) 100 MG capsule Take 1 capsule (100 mg total) by mouth 2 (two) times daily for 7 days. 08/14/21 08/21/21  Eulogio Bear, NP  ibuprofen (ADVIL,MOTRIN) 600 MG tablet Take 600 mg by mouth as needed. 09/26/17   [provider]  predniSONE (DELTASONE) 20 MG tablet Take 2 tablets (40 mg total) by mouth daily with breakfast for 5 days. 08/14/21 08/19/21  Eulogio Bear, NP  rosuvastatin (CRESTOR) 5 MG tablet TAKE ONE TABLET (5 MG TOTAL) BY MOUTH DAILY. 09/02/20   Satira Sark, MD      Allergies    Avelox [moxifloxacin hcl in nacl]    Review of Systems   Review of Systems  All other systems reviewed and are negative.   Physical Exam Updated Vital Signs BP (!) 102/51   Pulse 66   Temp 98.1 F (36.7 C) (Oral)   Resp 16   Ht '5\' 1"'$  (1.549 m)   Wt 40.9 kg   SpO2 92%   BMI 17.04 kg/m  Physical Exam Vitals and nursing note reviewed.  Constitutional:      General: She is not in acute distress.    Appearance: She is well-developed. She is not diaphoretic.  HENT:     Head: Normocephalic and atraumatic.  Cardiovascular:     Rate and Rhythm: Normal rate and regular rhythm.     Heart sounds: No murmur heard.    No friction rub. No gallop.  Pulmonary:     Effort: Pulmonary effort is normal. No respiratory distress.     Breath sounds: Examination of the right-middle field reveals rhonchi. Examination of the left-middle field reveals rhonchi. Rhonchi present. No wheezing.  Abdominal:     General: Bowel sounds are normal. There is no distension.     Palpations: Abdomen is soft.     Tenderness: There is  no abdominal tenderness.  Musculoskeletal:        General: Normal range of motion.     Cervical back: Normal range of motion and neck supple.     Right lower leg: No tenderness. No edema.     Left lower leg: No tenderness. No edema.  Skin:    General: Skin is warm and dry.  Neurological:     General: No focal deficit present.     Mental Status: She is alert and oriented to person, place, and time.     ED Results / Procedures / Treatments   Labs (all labs ordered are listed, but only abnormal results are displayed) Labs Reviewed  CBC WITH DIFFERENTIAL/PLATELET - Abnormal; Notable for the following components:      Result Value   Neutro Abs 8.2 (*)    All other components within normal limits  BASIC METABOLIC PANEL - Abnormal; Notable for the following components:   Glucose, Bld 156 (*)    All other components within normal limits  LIPASE, BLOOD    EKG EKG Interpretation  Date/Time:  Monday August 17 2021 00:13:21 EDT Ventricular Rate:  95 PR Interval:  160 QRS Duration: 96 QT Interval:  366 QTC Calculation: 461 R Axis:   78 Text Interpretation: Sinus rhythm Baseline wander in lead(s) V6 Confirmed by Veryl Speak [phone]) on 08/17/2021 12:41:39 AM  Radiology DG Chest Portable 1 View  Result Date: 08/17/2021 CLINICAL DATA:  Shortness of breath EXAM: PORTABLE CHEST 1 VIEW COMPARISON:  04/07/2021 CT FINDINGS: Cardiac shadow is stable. Aortic calcifications are noted. The lungs are hyperinflated but clear. Focal eventration of left hemidiaphragm is seen. Bony abnormality is noted. IMPRESSION: COPD without acute abnormality. Electronically Signed   By: Inez Catalina M.D.   On: 08/17/2021 02:01    Procedures Procedures    Medications Ordered in ED Medications  albuterol (PROVENTIL) (2.5 MG/3ML) 0.083% nebulizer solution 2.5 mg ( Nebulization Canceled Entry 08/17/21 0145)  ipratropium-albuterol (DUONEB) 0.5-2.5 (3) MG/3ML nebulizer solution 3 mL ( Nebulization Canceled Entry  08/17/21 0145)  ipratropium-albuterol (DUONEB) 0.5-2.5 (3) MG/3ML nebulizer solution (3 mLs  Given 08/17/21 0144)  albuterol (PROVENTIL) (2.5 MG/3ML) 0.083% nebulizer solution (2.5 mg  Given 08/17/21 0144)    ED Course/ Medical Decision Making/ A&P  Patient presenting with complaints of shortness of breath/exacerbation of COPD.  She is feeling better after receiving a DuoNeb here in the ER.  She is already taking prednisone that was prescribed for her 2 days ago.  I will have her continue this and return if symptoms worsen.  Oxygen saturations currently in the 93 to 94% range while on room air.  She feels symptomatically improved and seems appropriate for discharge.  Her work-up including  laboratory studies and chest x-ray are unremarkable.  EKG is unchanged.  Final Clinical Impression(s) / ED Diagnoses Final diagnoses:  None    Rx / DC Orders ED Discharge Orders     None         Veryl Speak, MD 08/17/21 [phone]

## 2021-08-17 NOTE — ED Triage Notes (Signed)
Pt arrived from home via [facility] c/o sob. Pt gave herself a Neb Treatment PTA EMS arrival. Pt reports being at a charity function in the heat and humidity today for too long and Pt reports it has led her to have a COPD exacerbation. Pt endorses abdominal bloat and distention with epigastric tenderness to palpitation. Pt does have Hx of gallstones.

## 2021-08-17 NOTE — ED Notes (Signed)
Pt reports having difficulty with breathing still. RT paged to assess Pt at bedside.

## 2021-08-17 NOTE — Discharge Instructions (Signed)
Continue medications as previously prescribed.  Return to the ER if symptoms significantly worsen or change. 

## 2021-08-18 LAB — KAPPA/LAMBDA LIGHT CHAINS
Kappa free light chain: 174.9 mg/L — ABNORMAL HIGH (ref 3.3–19.4)
Kappa, lambda light chain ratio: 33.63 — ABNORMAL HIGH (ref 0.26–1.65)
Lambda free light chains: 5.2 mg/L — ABNORMAL LOW (ref 5.7–26.3)

## 2021-08-19 LAB — PROTEIN ELECTROPHORESIS, SERUM
A/G Ratio: 1.1 (ref 0.7–1.7)
Albumin ELP: 3.7 g/dL (ref 2.9–4.4)
Alpha-1-Globulin: 0.2 g/dL (ref 0.0–0.4)
Alpha-2-Globulin: 0.8 g/dL (ref 0.4–1.0)
Beta Globulin: 0.7 g/dL (ref 0.7–1.3)
Gamma Globulin: 1.7 g/dL (ref 0.4–1.8)
Globulin, Total: 3.4 g/dL (ref 2.2–3.9)
M-Spike, %: 1.5 g/dL — ABNORMAL HIGH
Total Protein ELP: 7.1 g/dL (ref 6.0–8.5)

## 2021-08-21 ENCOUNTER — Telehealth: Payer: Self-pay | Admitting: Internal Medicine

## 2021-08-21 NOTE — Telephone Encounter (Signed)
I spoke with the pt and advised her that there are no sooner appts available.  She will be added to the cancellation list.  She will continue the recommendations per ED and call back if the symptoms worsen

## 2021-08-21 NOTE — Telephone Encounter (Signed)
Patient called asking if she could move up her endo/colon before 8/30.  Last weekend on Friday, she went to Urgent Care and then to the ER on Sunday for symptoms of pressure under her breast, pain on her back and sides, with the pressure effecting her breathing.  She is in a lot of discomfort and wants to get these procedures done as soon as possible.  The doctor put her on prednisone and doxycycline and she put herself on Prilosec.  She said the only relief she gets is by being on a heating pad, but she said she can't stay on that all day.  Please call patient and advise.  Thank you.

## 2021-08-24 ENCOUNTER — Ambulatory Visit (HOSPITAL_COMMUNITY): Payer: Medicare HMO | Admitting: Physician Assistant

## 2021-08-25 ENCOUNTER — Ambulatory Visit (HOSPITAL_COMMUNITY): Payer: Medicare HMO | Admitting: Physician Assistant

## 2021-08-26 NOTE — Progress Notes (Unsigned)
Misty Richardson, [place] [address]   CLINIC:  Medical Oncology/Hematology  PCP:  [facility], [address] [place] [address] [phone]   REASON FOR VISIT:  Follow-up for MGUS  PRIOR THERAPY: None  CURRENT THERAPY: Observation  INTERVAL HISTORY:  Misty Richardson 66 y.o. female returns for routine follow-up of her MGUS.  She was last seen by [name] PA-C on 04/07/2021.  At today's visit, she reports feeling fair, apart from some recent exacerbations of her COPD. ***   She has chronic intermittent left-sided ankle pain due to remote history of left ankle fracture, but she denies any new bone pain or recent fractures.  *** *** She denies any B symptoms such as fever, chills, night sweats, unintentional weight loss. *** No new neurologic symptoms such as tinnitus, new-onset hearing loss, blurred vision, headache, or dizziness.  Denies any numbness or tingling in hands or feet.  *** *** No thromboembolic events since her last visit. *** No new masses or lymphadenopathy per her report. *** She reports continued fatigue, which is slightly worse than her last visit, which she relates to her recent COPD exacerbation.  She has  *** % energy and  *** % appetite. She endorses that she is maintaining a stable weight. ***    REVIEW OF SYSTEMS:  ***  Review of Systems  Constitutional:  Positive for fatigue. Negative for appetite change, chills, diaphoresis, fever and unexpected weight change.  HENT:   Negative for lump/mass and nosebleeds.   Eyes:  Negative for eye problems.  Respiratory:  Positive for shortness of breath (COPD). Negative for cough and hemoptysis.   Cardiovascular:  Positive for chest pain (Occasional, none today). Negative for leg swelling and palpitations.  Gastrointestinal:  Negative for abdominal pain, blood in stool, constipation, diarrhea, nausea and vomiting.  Genitourinary:  Negative for hematuria.    Skin: Negative.   Neurological:  Negative for dizziness, headaches and light-headedness.  Hematological:  Does not bruise/bleed easily.  Psychiatric/Behavioral:  Positive for sleep disturbance. The patient is nervous/anxious.       PAST MEDICAL/SURGICAL HISTORY:  Past Medical History:  Diagnosis Date   Anxiety    COPD (chronic obstructive pulmonary disease) (HCC)    Gallstones    MGUS (monoclonal gammopathy of unknown significance)    Osteoporosis    Past Surgical History:  Procedure Laterality Date   ABDOMINAL HYSTERECTOMY     APPENDECTOMY     BONE BIOPSY Left 03/25/2014   BONE MARROW ASPIRATION Left 03/25/2014   BONE MARROW BIOPSY Left 03/19/2014   CESAREAN SECTION     x2     SOCIAL HISTORY:  Social History   Socioeconomic History   Marital status: Divorced    Spouse name: Not on file   Number of children: 2   Years of education: Not on file   Highest education level: Not on file  Occupational History   Occupation: Disabled  Tobacco Use   Smoking status: Every Day    Packs/day: 0.50    Types: Cigarettes    Last attempt to quit: 07/11/2017    Years since quitting: 4.1   Smokeless tobacco: Former    Quit date: 07/31/2012  Vaping Use   Vaping Use: Never used  Substance and Sexual Activity   Alcohol use: Yes    Comment: occasionally   Drug use: No   Sexual activity: Not Currently  Other Topics Concern   Not on file  Social History Narrative  Not on file   Social Determinants of Health   Financial Resource Strain: Not on file  Food Insecurity: Not on file  Transportation Needs: Not on file  Physical Activity: Not on file  Stress: Not on file  Social Connections: Not on file  Intimate Partner Violence: Not on file    FAMILY HISTORY:  Family History  Problem Relation Age of Onset   Arthritis Mother    Heart failure Father    Colon cancer Maternal Grandmother    Esophageal cancer Paternal Grandfather    Stomach cancer Neg Hx    Colon polyps Neg  Hx     CURRENT MEDICATIONS:  Outpatient Encounter Medications as of 08/27/2021  Medication Sig   albuterol (PROVENTIL HFA;VENTOLIN HFA) 108 (90 BASE) MCG/ACT inhaler Inhale 2 puffs into the lungs every 6 (six) hours as needed for wheezing or shortness of breath. Reported on 02/06/2015   albuterol (PROVENTIL) (2.5 MG/3ML) 0.083% nebulizer solution Take 2.5 mg by nebulization every 6 (six) hours as needed for wheezing or shortness of breath.   alendronate (FOSAMAX) 70 MG tablet TAKE ONE TABLET ($RemoveBef'70MG'zaYQAZmfOc$  TOTAL) BY MOUTH ONCE A WEEK   ALPRAZolam (XANAX) 1 MG tablet Take 1 mg by mouth at bedtime as needed.   aspirin EC 81 MG tablet Take 1 tablet (81 mg total) by mouth daily. Swallow whole.   budesonide-formoterol (SYMBICORT) 160-4.5 MCG/ACT inhaler Inhale 2 puffs into the lungs 2 (two) times daily.   Calcium Carbonate-Vitamin D 600-200 MG-UNIT TABS Take 1 tablet by mouth 2 (two) times daily.   cholecalciferol (VITAMIN D3) 25 MCG (1000 UNIT) tablet Take 1,000 Units by mouth daily.   ibuprofen (ADVIL,MOTRIN) 600 MG tablet Take 600 mg by mouth as needed.   rosuvastatin (CRESTOR) 5 MG tablet TAKE ONE TABLET (5 MG TOTAL) BY MOUTH DAILY.   No facility-administered encounter medications on file as of 08/27/2021.    ALLERGIES:  Allergies  Allergen Reactions   Avelox [Moxifloxacin Hcl In Nacl] Rash     PHYSICAL EXAM:  ***  ECOG PERFORMANCE STATUS: 1 - Symptomatic but completely ambulatory  There were no vitals filed for this visit. There were no vitals filed for this visit. Physical Exam Constitutional:      Appearance: Normal appearance.  HENT:     Head: Normocephalic and atraumatic.     Mouth/Throat:     Mouth: Mucous membranes are moist.  Eyes:     Extraocular Movements: Extraocular movements intact.     Pupils: Pupils are equal, round, and reactive to light.  Cardiovascular:     Rate and Rhythm: Normal rate and regular rhythm.     Pulses: Normal pulses.     Heart sounds: Normal heart  sounds.  Pulmonary:     Effort: Pulmonary effort is normal.     Comments: Coarse breath sounds in all lung fields Abdominal:     General: Bowel sounds are normal.     Palpations: Abdomen is soft.     Tenderness: There is no abdominal tenderness.  Musculoskeletal:        General: No swelling.     Right lower leg: No edema.     Left lower leg: No edema.  Lymphadenopathy:     Cervical: No cervical adenopathy.  Skin:    General: Skin is warm and dry.  Neurological:     General: No focal deficit present.     Mental Status: She is alert and oriented to person, place, and time.  Psychiatric:  Mood and Affect: Mood normal.        Behavior: Behavior normal.      LABORATORY DATA:  I have reviewed the labs as listed.  CBC    Component Value Date/Time   WBC 8.5 08/17/2021 1338   RBC 4.26 08/17/2021 1338   HGB 13.3 08/17/2021 1338   HGB 14.7 07/04/2021 1529   HCT 39.4 08/17/2021 1338   HCT 43.1 07/04/2021 1529   PLT 276 08/17/2021 1338   PLT 307 07/04/2021 1529   MCV 92.5 08/17/2021 1338   MCV 91 07/04/2021 1529   MCH 31.2 08/17/2021 1338   MCHC 33.8 08/17/2021 1338   RDW 13.4 08/17/2021 1338   RDW 12.8 07/04/2021 1529   LYMPHSABS 2.7 08/17/2021 1338   LYMPHSABS 2.7 07/04/2021 1529   MONOABS 0.6 08/17/2021 1338   EOSABS 0.0 08/17/2021 1338   EOSABS 0.4 07/04/2021 1529   BASOSABS 0.0 08/17/2021 1338   BASOSABS 0.1 07/04/2021 1529      Latest Ref Rng & Units 08/17/2021    1:38 PM 08/17/2021   12:20 AM 07/04/2021    3:29 PM  CMP  Glucose 70 - 99 mg/dL 97  156  CANCELED   BUN 8 - 23 mg/dL 24  21  CANCELED   Creatinine 0.44 - 1.00 mg/dL 0.66  0.84  CANCELED   Sodium 135 - 145 mmol/L 141  138  CANCELED   Potassium 3.5 - 5.1 mmol/L 3.4  3.5  CANCELED   Chloride 98 - 111 mmol/L 111  107  CANCELED   CO2 22 - 32 mmol/L 25  26  CANCELED   Calcium 8.9 - 10.3 mg/dL 9.5  9.0  CANCELED   Total Protein 6.5 - 8.1 g/dL 7.5   CANCELED   Total Bilirubin 0.3 - 1.2 mg/dL 0.4    CANCELED   Alkaline Phos 38 - 126 U/L 50   CANCELED   AST 15 - 41 U/L 25   CANCELED   ALT 0 - 44 U/L 26   CANCELED     DIAGNOSTIC IMAGING:  I have independently reviewed the relevant imaging and discussed with the patient.  ASSESSMENT & PLAN: 1.  IgG kappa MGUS - Bone marrow biopsy in March 2016 shows 9% plasma cells with kappa restriction. (Previous biopsy in August 2010 showed 3% plasma cells) - Skeletal survey from 07/11/2017 shows stable T7 compression deformity with no new lytic lesions - Skeletal survey from 12/25/2019 shows no evidence of multiple myeloma, no lytic lesions - Most recent skeletal survey (12/22/2020): Diffuse osteopenia with multiple small lucencies noted over the skull/questionable faint lucency noted over the proximal right humerus and questionable small lucency noted over the right posterior eighth rib; myeloma or metastatic disease cannot be excluded, per radiology report. - Repeat imaging studies (03/31/2021): Rib x-ray: Lucency previously identified in the right eighth rib is not definitively seen Skull x-ray: No new lucencies and previously described subtle skull lucencies demonstrate no significant change Right humerus x-ray: No suspicious lytic lesion seen within the right humerus; proximal lucency appears unchanged from prior and may be symmetric bilaterally - Myeloma panel (08/17/2021): SPEP with M spike stable at 1.5.  Free light chains have trended down slightly with elevated kappa 174.9, low lambda 5.2, and ratio 33.63.  LDH normal at 107. - Additional labs (08/17/2021): Hgb 13.3, creatinine 0.66, calcium 9.5 - She does not report any new onset bone pains, B symptoms, or recurrent infections    ***  - Her M spike and FLC ratio  have been up and down over the past several months, and have stabilized somewhat from their spike at her visit in ***. - PLAN: We will continue to watch closely with repeat MGUS/myeloma panel in 4 months ***  - We will plan on repeat  skeletal survey prior to her next visit ***  - Will consider bone marrow biopsy or PET if significant progression or development of CRAB signs/symptoms ***    2.  Smoking history: - She smoked 1 pack/day for the past 42 years. - CT lung on 11/24/2019 returned showing a Lung-RADS 2S - LDCT chest (04/07/2021): Lung RADS 2S, benign appearance or behavior - PLAN: Continue annual LDCT chest, next due March 2024.  ***   3.  Osteoporosis: - Bone density test on 08/13/2019 shows T score -3.4. - Most recent bone density/DEXA scan (08/17/2021): Osteoporosis with T-score -3.5 - She is taking Fosamax weekly since *** and also continue to take calcium and vitamin D. - PLAN: Continue Fosamax, calcium, and vitamin D.  We will repeat bone density scan in July  *** .      4.  Health maintenance: - Mammogram on 03/04/2021 was BI-RADS Category 1, negative - PLAN: Continue screening mammograms via PCP   PLAN SUMMARY & DISPOSITION:  ***   All questions were answered. The patient knows to call the clinic with any problems, questions or concerns.  Medical decision making: Moderate ***   Time spent on visit: I spent  ***  minutes counseling the patient face to face. The total time spent in the appointment was  ***  minutes and more than 50% was on counseling.   Harriett Rush, PA-C   ***

## 2021-08-27 ENCOUNTER — Inpatient Hospital Stay: Payer: Medicare HMO | Attending: Physician Assistant | Admitting: Physician Assistant

## 2021-08-27 ENCOUNTER — Other Ambulatory Visit: Payer: Self-pay | Admitting: Physician Assistant

## 2021-08-27 ENCOUNTER — Ambulatory Visit (HOSPITAL_COMMUNITY)
Admission: RE | Admit: 2021-08-27 | Discharge: 2021-08-27 | Disposition: A | Discharge status: Home / Self Care | Payer: Medicare HMO | Source: Ambulatory Visit | Attending: Physician Assistant | Admitting: Physician Assistant

## 2021-08-27 VITALS — BP 97/70 | HR 89 | Temp 98.6°F | Resp 18 | Ht 61.0 in | Wt 88.4 lb

## 2021-08-27 DIAGNOSIS — M546 Pain in thoracic spine: Secondary | ICD-10-CM | POA: Insufficient documentation

## 2021-08-27 DIAGNOSIS — M810 Age-related osteoporosis without current pathological fracture: Secondary | ICD-10-CM

## 2021-08-27 DIAGNOSIS — D472 Monoclonal gammopathy: Secondary | ICD-10-CM

## 2021-08-27 DIAGNOSIS — Z79899 Other long term (current) drug therapy: Secondary | ICD-10-CM | POA: Insufficient documentation

## 2021-08-27 DIAGNOSIS — M549 Dorsalgia, unspecified: Secondary | ICD-10-CM | POA: Diagnosis not present

## 2021-08-27 DIAGNOSIS — M545 Low back pain, unspecified: Secondary | ICD-10-CM | POA: Diagnosis not present

## 2021-08-27 DIAGNOSIS — M5136 Other intervertebral disc degeneration, lumbar region: Secondary | ICD-10-CM | POA: Diagnosis not present

## 2021-08-27 NOTE — Patient Instructions (Signed)
[facility] Discharge Instructions  You were seen today by [name] PA-C for your MGUS ("monoclonal gammopathy of undetermined significance").  As we have previously discussed, MGUS is essentially an abnormal protein in your blood which can progress to a type of cancer called multiple myeloma.  At this time, your blood work does not show any sign of having progressed to multiple myeloma.  Your blood work is stable and slightly improved from before.  We will check labs and Xrays again in 4 months to make sure that your labs are still stable.  Continue to take Fosamax once a week for your osteoporosis.  FOLLOW-UP APPOINTMENT: Office visit in 4 months, after labs   - - - - - - - - - - - - - - - - - -    Thank you for choosing [place] at [facility] to provide your oncology and hematology care.  To afford each patient quality time with our provider, please arrive at least 15 minutes before your scheduled appointment time.   If you have a lab appointment with the [facility] please come in thru the Main Entrance and check in at the main information desk.  You need to re-schedule your appointment should you arrive 10 or more minutes late.  We strive to give you quality time with our providers, and arriving late affects you and other patients whose appointments are after yours.  Also, if you no show three or more times for appointments you may be dismissed from the clinic at the providers discretion.     Again, thank you for choosing [facility].  Our hope is that these requests will decrease the amount of time that you wait before being seen by our physicians.       _____________________________________________________________  Should you have questions after your visit to [facility], please contact our office at [phone] and follow the prompts.  Our office hours are 8:00 a.m. and 4:30 p.m.  Monday - Friday.  Please note that voicemails left after 4:00 p.m. may not be returned until the following business day.  We are closed weekends and major holidays.  You do have access to a nurse 24-7, just call the main number to the clinic [phone] and do not press any options, hold on the line and a nurse will answer the phone.    For prescription refill requests, have your pharmacy contact our office and allow 72 hours.    Due to Covid, you will need to wear a mask upon entering the hospital. If you do not have a mask, a mask will be given to you at the Main Entrance upon arrival. For doctor visits, patients may have 1 support person age 26 or older with them. For treatment visits, patients can not have anyone with them due to social distancing guidelines and our immunocompromised population.

## 2021-08-28 NOTE — Progress Notes (Signed)
Called patient and she is aware of results. She is going to think about this and call us back with a decision of whether she wants to see neurosurgeon or not.

## 2021-09-02 ENCOUNTER — Ambulatory Visit: Payer: Medicare HMO | Admitting: Student

## 2021-09-09 ENCOUNTER — Encounter: Payer: Self-pay | Admitting: Internal Medicine

## 2021-09-13 ENCOUNTER — Encounter: Payer: Self-pay | Admitting: Certified Registered Nurse Anesthetist

## 2021-09-16 ENCOUNTER — Ambulatory Visit (AMBULATORY_SURGERY_CENTER): Payer: Medicare HMO | Admitting: Internal Medicine

## 2021-09-16 ENCOUNTER — Encounter: Payer: Self-pay | Admitting: Internal Medicine

## 2021-09-16 VITALS — BP 103/67 | HR 85 | Temp 98.0°F | Resp 23 | Ht 61.0 in | Wt 90.0 lb

## 2021-09-16 DIAGNOSIS — R634 Abnormal weight loss: Secondary | ICD-10-CM

## 2021-09-16 DIAGNOSIS — D125 Benign neoplasm of sigmoid colon: Secondary | ICD-10-CM

## 2021-09-16 DIAGNOSIS — Z1211 Encounter for screening for malignant neoplasm of colon: Secondary | ICD-10-CM

## 2021-09-16 DIAGNOSIS — D120 Benign neoplasm of cecum: Secondary | ICD-10-CM

## 2021-09-16 DIAGNOSIS — J449 Chronic obstructive pulmonary disease, unspecified: Secondary | ICD-10-CM | POA: Diagnosis not present

## 2021-09-16 DIAGNOSIS — R1013 Epigastric pain: Secondary | ICD-10-CM | POA: Diagnosis not present

## 2021-09-16 DIAGNOSIS — K219 Gastro-esophageal reflux disease without esophagitis: Secondary | ICD-10-CM

## 2021-09-16 DIAGNOSIS — K2289 Other specified disease of esophagus: Secondary | ICD-10-CM | POA: Diagnosis not present

## 2021-09-16 DIAGNOSIS — K2970 Gastritis, unspecified, without bleeding: Secondary | ICD-10-CM | POA: Diagnosis not present

## 2021-09-16 DIAGNOSIS — R69 Illness, unspecified: Secondary | ICD-10-CM | POA: Diagnosis not present

## 2021-09-16 MED ORDER — SODIUM CHLORIDE 0.9 % IV SOLN: 500.0000 mL | Freq: Once | INTRAVENOUS | Status: DC

## 2021-09-16 MED ORDER — OMEPRAZOLE 40 MG PO CPDR: 40.0000 mg | DELAYED_RELEASE_CAPSULE | Freq: Two times a day (BID) | ORAL | 1 refills | Status: DC

## 2021-09-16 NOTE — Progress Notes (Unsigned)
GASTROENTEROLOGY PROCEDURE H&P NOTE   Primary Care Physician: Redmond School, MD    Reason for Procedure:   Dyspepsia, epigastric ab discomfort, weight loss, colon cancer screening  Plan:    EGD/colonoscopy  Patient is appropriate for endoscopic procedure(s) in the ambulatory ([facility]) setting.  The nature of the procedure, as well as the risks, benefits, and alternatives were carefully and thoroughly reviewed with the patient. Ample time for discussion and questions allowed. The patient understood, was satisfied, and agreed to proceed.     HPI: Misty Richardson is a 66 y.o. female who presents for EGD/colonoscopy for evaluation of dyspepsia, epigastric ab discomfort, weight loss, colon cancer screening .  Patient was most recently seen in the Gastroenterology Clinic on 08/13/21.  No interval change in medical history since that appointment. Please refer to that note for full details regarding GI history and clinical presentation.   Past Medical History:  Diagnosis Date   Anxiety    COPD (chronic obstructive pulmonary disease) (HCC)    Gallstones    MGUS (monoclonal gammopathy of unknown significance)    Osteoporosis     Past Surgical History:  Procedure Laterality Date   ABDOMINAL HYSTERECTOMY     APPENDECTOMY     BONE BIOPSY Left 03/25/2014   BONE MARROW ASPIRATION Left 03/25/2014   BONE MARROW BIOPSY Left 03/19/2014   CESAREAN SECTION     x2    Prior to Admission medications   Medication Sig Start Date End Date Taking? Authorizing Provider  albuterol (PROVENTIL HFA;VENTOLIN HFA) 108 (90 BASE) MCG/ACT inhaler Inhale 2 puffs into the lungs every 6 (six) hours as needed for wheezing or shortness of breath. Reported on 02/06/2015   Yes [provider]  albuterol (PROVENTIL) (2.5 MG/3ML) 0.083% nebulizer solution Take 2.5 mg by nebulization every 6 (six) hours as needed for wheezing or shortness of breath.   Yes [provider]  alendronate (FOSAMAX) 70 MG  tablet TAKE ONE TABLET ($RemoveBef'70MG'jRWVUBQPgw$  TOTAL) BY MOUTH ONCE A WEEK 03/30/21  Yes Pennington, Rebekah M, PA-C  ALPRAZolam Duanne Moron) 1 MG tablet Take 1 mg by mouth at bedtime as needed. 12/02/16  Yes [provider]  aspirin EC 81 MG tablet Take 1 tablet (81 mg total) by mouth daily. Swallow whole. 03/27/20  Yes Satira Sark, MD  budesonide-formoterol [facility]) 160-4.5 MCG/ACT inhaler Inhale 2 puffs into the lungs 2 (two) times daily. 09/23/12  Yes Gosrani, Nimish C, MD  Calcium Carbonate-Vitamin D 600-200 MG-UNIT TABS Take 1 tablet by mouth 2 (two) times daily.   Yes [provider]  cholecalciferol (VITAMIN D3) 25 MCG (1000 UNIT) tablet Take 1,000 Units by mouth daily.   Yes [provider]  rosuvastatin (CRESTOR) 5 MG tablet TAKE ONE TABLET (5 MG TOTAL) BY MOUTH DAILY. 09/02/20  Yes Satira Sark, MD  ibuprofen (ADVIL,MOTRIN) 600 MG tablet Take 600 mg by mouth as needed. 09/26/17   [provider]    Current Outpatient Medications  Medication Sig Dispense Refill   albuterol (PROVENTIL HFA;VENTOLIN HFA) 108 (90 BASE) MCG/ACT inhaler Inhale 2 puffs into the lungs every 6 (six) hours as needed for wheezing or shortness of breath. Reported on 02/06/2015     albuterol (PROVENTIL) (2.5 MG/3ML) 0.083% nebulizer solution Take 2.5 mg by nebulization every 6 (six) hours as needed for wheezing or shortness of breath.     alendronate (FOSAMAX) 70 MG tablet TAKE ONE TABLET ($RemoveBef'70MG'rOtcCfnsDD$  TOTAL) BY MOUTH ONCE A WEEK 12 tablet 1   ALPRAZolam (XANAX) 1  MG tablet Take 1 mg by mouth at bedtime as needed.     aspirin EC 81 MG tablet Take 1 tablet (81 mg total) by mouth daily. Swallow whole. 90 tablet 3   budesonide-formoterol (SYMBICORT) 160-4.5 MCG/ACT inhaler Inhale 2 puffs into the lungs 2 (two) times daily. 1 Inhaler 1   Calcium Carbonate-Vitamin D 600-200 MG-UNIT TABS Take 1 tablet by mouth 2 (two) times daily.     cholecalciferol (VITAMIN D3) 25 MCG (1000 UNIT) tablet Take 1,000 Units by  mouth daily.     rosuvastatin (CRESTOR) 5 MG tablet TAKE ONE TABLET (5 MG TOTAL) BY MOUTH DAILY. 100 tablet 2   ibuprofen (ADVIL,MOTRIN) 600 MG tablet Take 600 mg by mouth as needed.     Current Facility-Administered Medications  Medication Dose Route Frequency Provider Last Rate Last Admin   0.9 %  sodium chloride infusion  500 mL Intravenous Once Sharyn Creamer, MD        Allergies as of 09/16/2021 - Review Complete 09/16/2021  Allergen Reaction Noted   Avelox [moxifloxacin hcl in nacl] Rash 07/25/2010    Family History  Problem Relation Age of Onset   Arthritis Mother    Heart failure Father    Colon cancer Maternal Grandmother    Esophageal cancer Paternal Grandfather    Stomach cancer Neg Hx    Colon polyps Neg Hx     Social History   Socioeconomic History   Marital status: Divorced    Spouse name: Not on file   Number of children: 2   Years of education: Not on file   Highest education level: Not on file  Occupational History   Occupation: Disabled  Tobacco Use   Smoking status: Every Day    Packs/day: 0.50    Types: Cigarettes    Last attempt to quit: 07/11/2017    Years since quitting: 4.1   Smokeless tobacco: Former    Quit date: 07/31/2012  Vaping Use   Vaping Use: Never used  Substance and Sexual Activity   Alcohol use: Yes    Comment: occasionally   Drug use: No   Sexual activity: Not Currently  Other Topics Concern   Not on file  Social History Narrative   Not on file   Social Determinants of Health   Financial Resource Strain: Not on file  Food Insecurity: Not on file  Transportation Needs: Not on file  Physical Activity: Not on file  Stress: Not on file  Social Connections: Not on file  Intimate Partner Violence: Not on file    Physical Exam: Vital signs in last 24 hours: BP 118/60   Pulse (!) 107   Temp 98 F (36.7 C) (Temporal)   Ht $R'5\' 1"'ha$  (1.549 m)   Wt 90 lb (40.8 kg)   SpO2 94%   BMI 17.01 kg/m  GEN: NAD EYE: Sclerae  anicteric ENT: MMM CV: Non-tachycardic Pulm: No increased WOB GI: Soft NEURO:  Alert & Oriented   Christia Reading, MD [facility]   09/16/2021 3:10 PM

## 2021-09-16 NOTE — Progress Notes (Unsigned)
Called to room to assist during endoscopic procedure.  Patient ID and intended procedure confirmed with present staff. Received instructions for my participation in the procedure from the performing physician.  

## 2021-09-16 NOTE — Progress Notes (Unsigned)
VSS, transported to PACU °

## 2021-09-16 NOTE — Patient Instructions (Addendum)
Handouts provided on gastritis, polyps, diverticulosis and hemorrhoids.   Use Prilosec (omeprazole) '40mg'$  by mouth twice daily for 8 weeks.   No aspirin, ibuprofen, naproxen, or other non-steriodal anti-inflammatory drugs (NSAIDs). You may use Tylenol if needed for mild pain or fever.   Follow up in clinic in 6 weeks. Appt made for 11/03/21 at 3:00pm.   YOU HAD AN ENDOSCOPIC PROCEDURE TODAY AT [facility]:   Refer to the procedure report that was given to you for any specific questions about what was found during the examination.  If the procedure report does not answer your questions, please call your gastroenterologist to clarify.  If you requested that your care partner not be given the details of your procedure findings, then the procedure report has been included in a sealed envelope for you to review at your convenience later.  YOU SHOULD EXPECT: Some feelings of bloating in the abdomen. Passage of more gas than usual.  Walking can help get rid of the air that was put into your GI tract during the procedure and reduce the bloating. If you had a lower endoscopy (such as a colonoscopy or flexible sigmoidoscopy) you may notice spotting of blood in your stool or on the toilet paper. If you underwent a bowel prep for your procedure, you may not have a normal bowel movement for a few days.  Please Note:  You might notice some irritation and congestion in your nose or some drainage.  This is from the oxygen used during your procedure.  There is no need for concern and it should clear up in a day or so.  SYMPTOMS TO REPORT IMMEDIATELY:  Following lower endoscopy (colonoscopy or flexible sigmoidoscopy):  Excessive amounts of blood in the stool  Significant tenderness or worsening of abdominal pains  Swelling of the abdomen that is new, acute  Fever of 100F or higher  Following upper endoscopy (EGD)  Vomiting of blood or coffee ground material  New chest pain or pain under the  shoulder blades  Painful or persistently difficult swallowing  New shortness of breath  Fever of 100F or higher  Black, tarry-looking stools  For urgent or emergent issues, a gastroenterologist can be reached at any hour by calling [phone]. Do not use MyChart messaging for urgent concerns.    DIET:  We do recommend a small meal at first, but then you may proceed to your regular diet.  Drink plenty of fluids but you should avoid alcoholic beverages for 24 hours.  ACTIVITY:  You should plan to take it easy for the rest of today and you should NOT DRIVE or use heavy machinery until tomorrow (because of the sedation medicines used during the test).    FOLLOW UP: Our staff will call the number listed on your records the next business day following your procedure.  We will call around 7:15- 8:00 am to check on you and address any questions or concerns that you may have regarding the information given to you following your procedure. If we do not reach you, we will leave a message.  If you develop any symptoms (ie: fever, flu-like symptoms, shortness of breath, cough etc.) before then, please call [phone].  If you test positive for Covid 19 in the 2 weeks post procedure, please call and report this information to [place].    If any biopsies were taken you will be contacted by phone or by letter within the next 1-3 weeks.  Please call us at [phone]  if you have not heard about the biopsies in 3 weeks.    SIGNATURES/CONFIDENTIALITY: You and/or your care partner have signed paperwork which will be entered into your electronic medical record.  These signatures attest to the fact that that the information above on your After Visit Summary has been reviewed and is understood.  Full responsibility of the confidentiality of this discharge information lies with you and/or your care-partner.

## 2021-09-16 NOTE — Op Note (Signed)
[facility] Patient Name: Braylea Brancato Procedure Date: 09/16/2021 3:21 PM MRN: [id number] Endoscopist: Sonny Masters "Christia Reading ,  Age: 66 Referring MD:  Date of Birth: 1955/10/07 Gender: Female Account #: [id number] Procedure:                Upper GI endoscopy Indications:              Epigastric abdominal pain, Dyspepsia, Weight loss Medicines:                Monitored Anesthesia Care Procedure:                Pre-Anesthesia Assessment:                           - Prior to the procedure, a History and Physical                            was performed, and patient medications and                            allergies were reviewed. The patient's tolerance of                            previous anesthesia was also reviewed. The risks                            and benefits of the procedure and the sedation                            options and risks were discussed with the patient.                            All questions were answered, and informed consent                            was obtained. Prior Anticoagulants: The patient has                            taken no previous anticoagulant or antiplatelet                            agents. ASA Grade Assessment: II - A patient with                            mild systemic disease. After reviewing the risks                            and benefits, the patient was deemed in                            satisfactory condition to undergo the procedure.                           After obtaining informed consent, the endoscope was  passed under direct vision. Throughout the                            procedure, the patient's blood pressure, pulse, and                            oxygen saturations were monitored continuously. The                            GIF HQ190 #[id number] was introduced through the                            mouth, and advanced to the second part of duodenum.                             The upper GI endoscopy was accomplished without                            difficulty. The patient tolerated the procedure                            well. Scope In: Scope Out: Findings:                 White nummular lesions were noted in the upper                            third of the esophagus. Biopsies were taken with a                            cold forceps for histology.                           Localized mucosal changes characterized by                            nodularity were found in the distal esophagus.                            Biopsies were taken with a cold forceps for                            histology.                           Diffuse inflammation characterized by congestion                            (edema), erosions and erythema was found in the                            entire examined stomach. Biopsies were taken with a                            cold forceps for histology.  Localized mild inflammation characterized by                            congestion (edema) and erythema was found in the                            duodenal bulb. Biopsies were taken with a cold                            forceps for histology. Complications:            No immediate complications. Estimated Blood Loss:     Estimated blood loss was minimal. Impression:               - White nummular lesions in esophageal mucosa.                            Biopsied.                           - Nodular mucosa in the esophagus. Biopsied.                           - Gastritis. Biopsied.                           - Duodenitis. Biopsied. Recommendation:           - No ibuprofen, naproxen, or other non-steroidal                            anti-inflammatory drugs.                           - Use Prilosec (omeprazole) 40 mg PO BID for 8                            weeks.                           - Await pathology results.                           - Perform a  colonoscopy today. Dr Georgian Co "Lyndee Leo" [place],  09/16/2021 3:59:57 PM

## 2021-09-16 NOTE — Op Note (Signed)
[facility] Patient Name: Misty Richardson Procedure Date: 09/16/2021 3:33 PM MRN: [id number] Endoscopist: Sonny Masters "Misty Richardson ,  Age: 66 Referring MD:  Date of Birth: 23-Mar-1955 Gender: Female Account #: [id number] Procedure:                Colonoscopy Indications:              Screening for colorectal malignant neoplasm Medicines:                Monitored Anesthesia Care Procedure:                Pre-Anesthesia Assessment:                           - Prior to the procedure, a History and Physical                            was performed, and patient medications and                            allergies were reviewed. The patient's tolerance of                            previous anesthesia was also reviewed. The risks                            and benefits of the procedure and the sedation                            options and risks were discussed with the patient.                            All questions were answered, and informed consent                            was obtained. Prior Anticoagulants: The patient has                            taken no previous anticoagulant or antiplatelet                            agents. ASA Grade Assessment: II - A patient with                            mild systemic disease. After reviewing the risks                            and benefits, the patient was deemed in                            satisfactory condition to undergo the procedure.                           After obtaining informed consent, the colonoscope  was passed under direct vision. Throughout the                            procedure, the patient's blood pressure, pulse, and                            oxygen saturations were monitored continuously. The                            CF [id number] #[id number] was introduced through the anus                            and advanced to the the terminal ileum. The                            colonoscopy was  performed without difficulty. The                            patient tolerated the procedure well. The quality                            of the bowel preparation was good. The terminal                            ileum, ileocecal valve, appendiceal orifice, and                            rectum were photographed. Scope In: 3:34:40 PM Scope Out: 3:52:40 PM Scope Withdrawal Time: 0 hours 13 minutes 31 seconds  Total Procedure Duration: 0 hours 18 minutes 0 seconds  Findings:                 The terminal ileum appeared normal.                           A 3 mm polyp was found in the cecum. The polyp was                            sessile. The polyp was removed with a cold snare.                            Resection and retrieval were complete.                           Two sessile polyps were found in the sigmoid colon.                            The polyps were 3 to 5 mm in size. These polyps                            were removed with a cold snare. Resection and  retrieval were complete.                           A few diverticula were found in the sigmoid colon.                           Non-bleeding internal hemorrhoids were found during                            retroflexion. Complications:            No immediate complications. Estimated Blood Loss:     Estimated blood loss was minimal. Impression:               - The examined portion of the ileum was normal.                           - One 3 mm polyp in the cecum, removed with a cold                            snare. Resected and retrieved.                           - Two 3 to 5 mm polyps in the sigmoid colon,                            removed with a cold snare. Resected and retrieved.                           - Diverticulosis in the sigmoid colon.                           - Non-bleeding internal hemorrhoids. Recommendation:           - Discharge patient to home (with escort).                            - Await pathology results.                           - The findings and recommendations were discussed                            with the patient.                           - Return to GI clinic in 6 weeks. Dr Georgian Co "[address]" [place],  09/16/2021 4:03:12 PM

## 2021-09-16 NOTE — Progress Notes (Unsigned)
VS completed by DT.   Pt's states no medical or surgical changes since previsit or office visit.    Patient complaining of shortness of breath and abdominal pressure. Patient completed both her Albuterol and Symbicort inhalers in pre-op. Patient's oxygen saturation was 95% on room air and heart rate was 115 bpm. Patient states that she's very anxious and the abdominal pressure she's feeling is the same as the pressure she's previously been feeling for the reason as to why she's having the endoscopy completed today.

## 2021-09-17 ENCOUNTER — Telehealth: Payer: Self-pay

## 2021-09-17 ENCOUNTER — Other Ambulatory Visit: Payer: Self-pay

## 2021-09-17 ENCOUNTER — Ambulatory Visit
Admission: EM | Admit: 2021-09-17 | Discharge: 2021-09-17 | Disposition: A | Discharge status: Home / Self Care | Payer: Medicare HMO | Attending: Family Medicine | Admitting: Family Medicine

## 2021-09-17 ENCOUNTER — Encounter: Payer: Self-pay | Admitting: Emergency Medicine

## 2021-09-17 DIAGNOSIS — R0602 Shortness of breath: Secondary | ICD-10-CM

## 2021-09-17 DIAGNOSIS — J441 Chronic obstructive pulmonary disease with (acute) exacerbation: Secondary | ICD-10-CM

## 2021-09-17 DIAGNOSIS — R000 Tachycardia, unspecified: Secondary | ICD-10-CM

## 2021-09-17 MED ORDER — METHYLPREDNISOLONE SODIUM SUCC 125 MG IJ SOLR
80.0000 mg | Freq: Once | INTRAMUSCULAR | Status: AC
  Administered 2021-09-17: 80 mg via INTRAMUSCULAR

## 2021-09-17 MED ORDER — IPRATROPIUM-ALBUTEROL 0.5-2.5 (3) MG/3ML IN SOLN
3.0000 mL | Freq: Once | RESPIRATORY_TRACT | Status: AC
  Administered 2021-09-17: 3 mL via RESPIRATORY_TRACT

## 2021-09-17 MED ORDER — IPRATROPIUM-ALBUTEROL 0.5-2.5 (3) MG/3ML IN SOLN: 3.0000 mL | RESPIRATORY_TRACT | 0 refills | Status: AC | PRN

## 2021-09-17 NOTE — ED Triage Notes (Signed)
Pt reports increased dyspnea and chest tightness since endoscopy and colonoscopy. Pt reports used albuterol neb at approximately 1700 today with no change in symptoms.   Intermittent dry cough. Audible wheezing heard in triage. Moderate dyspnea. Denies pain.

## 2021-09-17 NOTE — ED Provider Notes (Signed)
[facility]    CSN: [id number] Arrival date & time: 09/17/21  1746      History   Chief Complaint Chief Complaint  Patient presents with   Shortness of Breath    HPI Misty Richardson is a 66 y.o. female.   Patient presenting today with acute on chronic shortness of breath, wheezing, hacking cough.  States she noticed symptoms slightly worse than normal this morning and is progressively worsened throughout the day.  She takes Symbicort almost every day and has an albuterol inhaler, nebulizer machine.  Took a nebulizer treatment earlier today but states it was not enough.  Denies fever, chills, sore throat, congestion, chest pain, abdominal pain, nausea vomiting or diarrhea.  History of COPD, current smoker.    Past Medical History:  Diagnosis Date   Anxiety    COPD (chronic obstructive pulmonary disease) (HCC)    Gallstones    MGUS (monoclonal gammopathy of unknown significance)    Osteoporosis     Patient Active Problem List   Diagnosis Date Noted   Osteoporosis 03/14/2014   Tobacco use disorder 09/19/2012   MGUS (monoclonal gammopathy of unknown significance) 09/05/2012   COPD (chronic obstructive pulmonary disease) ([place]) 09/05/2012    Past Surgical History:  Procedure Laterality Date   ABDOMINAL HYSTERECTOMY     APPENDECTOMY     BONE BIOPSY Left 03/25/2014   BONE MARROW ASPIRATION Left 03/25/2014   BONE MARROW BIOPSY Left 03/19/2014   CESAREAN SECTION     x2    OB History     Gravida  2   Para  2   Term      Preterm      AB      Living  2      SAB      IAB      Ectopic      Multiple      Live Births               Home Medications    Prior to Admission medications   Medication Sig Start Date End Date Taking? Authorizing Provider  ipratropium-albuterol (DUONEB) 0.5-2.5 (3) MG/3ML SOLN Take 3 mLs by nebulization every 4 (four) hours as needed. 09/17/21  Yes Volney American, PA-C  albuterol (PROVENTIL HFA;VENTOLIN  HFA) 108 (90 BASE) MCG/ACT inhaler Inhale 2 puffs into the lungs every 6 (six) hours as needed for wheezing or shortness of breath. Reported on 02/06/2015    [provider]  albuterol (PROVENTIL) (2.5 MG/3ML) 0.083% nebulizer solution Take 2.5 mg by nebulization every 6 (six) hours as needed for wheezing or shortness of breath.    [provider]  alendronate (FOSAMAX) 70 MG tablet TAKE ONE TABLET (70MG TOTAL) BY MOUTH ONCE A WEEK 03/30/21   Pennington, Rebekah M, PA-C  ALPRAZolam Duanne Moron) 1 MG tablet Take 1 mg by mouth at bedtime as needed. 12/02/16   [provider]  aspirin EC 81 MG tablet Take 1 tablet (81 mg total) by mouth daily. Swallow whole. 03/27/20   Satira Sark, MD  budesonide-formoterol [facility]) 160-4.5 MCG/ACT inhaler Inhale 2 puffs into the lungs 2 (two) times daily. 09/23/12   Doree Albee, MD  Calcium Carbonate-Vitamin D 600-200 MG-UNIT TABS Take 1 tablet by mouth 2 (two) times daily.    [provider]  cholecalciferol (VITAMIN D3) 25 MCG (1000 UNIT) tablet Take 1,000 Units by mouth daily.    [provider]  ibuprofen (ADVIL,MOTRIN) 600 MG tablet Take 600 mg  by mouth as needed. 09/26/17   [provider]  omeprazole (PRILOSEC) 40 MG capsule Take 1 capsule (40 mg total) by mouth 2 (two) times daily. 09/16/21   Sharyn Creamer, MD  rosuvastatin (CRESTOR) 5 MG tablet TAKE ONE TABLET (5 MG TOTAL) BY MOUTH DAILY. 09/02/20   Satira Sark, MD    Family History Family History  Problem Relation Age of Onset   Arthritis Mother    Heart failure Father    Colon cancer Maternal Grandmother    Esophageal cancer Paternal Grandfather    Stomach cancer Neg Hx    Colon polyps Neg Hx     Social History Social History   Tobacco Use   Smoking status: Every Day    Packs/day: 0.50    Types: Cigarettes    Last attempt to quit: 07/11/2017    Years since quitting: 4.1   Smokeless tobacco: Former    Quit date: 07/31/2012   Vaping Use   Vaping Use: Never used  Substance Use Topics   Alcohol use: Yes    Comment: occasionally   Drug use: No     Allergies   Avelox [moxifloxacin hcl in nacl]   Review of Systems Review of Systems Per HPI  Physical Exam Triage Vital Signs ED Triage Vitals  Enc Vitals Group     BP 09/17/21 1803 122/75     Pulse Rate 09/17/21 1803 (!) 111     Resp 09/17/21 1803 (!) 26     Temp 09/17/21 1803 98 F (36.7 C)     Temp Source 09/17/21 1803 Oral     SpO2 09/17/21 1803 94 %     Weight --      Height --      Head Circumference --      Peak Flow --      Pain Score 09/17/21 1804 0     Pain Loc --      Pain Edu? --      Excl. in [place]? --    No data found.  Updated Vital Signs BP 122/75 (BP Location: Right Arm)   Pulse (!) 111   Temp 98 F (36.7 C) (Oral)   Resp (!) 26   SpO2 96%   Visual Acuity Right Eye Distance:   Left Eye Distance:   Bilateral Distance:    Right Eye Near:   Left Eye Near:    Bilateral Near:     Physical Exam Vitals and nursing note reviewed.  Constitutional:      Appearance: Normal appearance. She is not ill-appearing.  HENT:     Head: Atraumatic.  Eyes:     Extraocular Movements: Extraocular movements intact.     Conjunctiva/sclera: Conjunctivae normal.  Cardiovascular:     Rate and Rhythm: Normal rate and regular rhythm.     Heart sounds: Normal heart sounds.  Pulmonary:     Effort: Pulmonary effort is normal.     Comments: Decreased breath sounds throughout.  Moderate wheezes bilaterally, no obvious rales on exam.  Speaking in full sentences.  Significant improvement in airflow post DuoNeb. Musculoskeletal:        General: Normal range of motion.     Cervical back: Normal range of motion and neck supple.  Skin:    General: Skin is warm and dry.  Neurological:     Mental Status: She is alert and oriented to person, place, and time.  Psychiatric:        Mood and Affect: Mood normal.  Thought Content: Thought content  normal.        Judgment: Judgment normal.      UC Treatments / Results  Labs (all labs ordered are listed, but only abnormal results are displayed) Labs Reviewed - No data to display  EKG   Radiology No results found.  Procedures Procedures (including critical care time)  Medications Ordered in UC Medications  ipratropium-albuterol (DUONEB) 0.5-2.5 (3) MG/3ML nebulizer solution 3 mL (3 mLs Nebulization Given 09/17/21 1838)  methylPREDNISolone sodium succinate (SOLU-MEDROL) 125 mg/2 mL injection 80 mg (80 mg Intramuscular Given 09/17/21 1835)    Initial Impression / Assessment and Plan / UC Course  I have reviewed the triage vital signs and the nursing notes.  Pertinent labs & imaging results that were available during my care of the patient were reviewed by me and considered in my medical decision making (see chart for details).     Heart rate improved to 74, oxygen improved to 96% on room air from 90 to 93% on room air previously.  We will treat COPD exacerbation with DuoNeb treatment, IM Solu-Medrol.  Significant proved after the DuoNeb treatment, states it worked much better than the albuterol alone.  We will send a supply of this to the pharmacy for home use as needed.  She has a primary care follow-up in the morning to recheck her breathing, discussed keep this appointment.  Continue Symbicort.  Return for worsening symptoms.  Final Clinical Impressions(s) / UC Diagnoses   Final diagnoses:  SOB (shortness of breath)  Tachycardia  COPD exacerbation [facility])   Discharge Instructions   None    ED Prescriptions     Medication Sig Dispense Auth. Provider   ipratropium-albuterol (DUONEB) 0.5-2.5 (3) MG/3ML SOLN Take 3 mLs by nebulization every 4 (four) hours as needed. 360 mL Volney American, [place]      PDMP not reviewed this encounter.   Volney American, [place] 09/17/21 1858

## 2021-09-17 NOTE — Telephone Encounter (Signed)
  Follow up Call-     09/16/2021    2:42 PM  Call back number  Post procedure Call Back phone  # [phone] pt mothers number  Permission to leave phone message Yes     Patient questions:  Do you have a fever, pain , or abdominal swelling? No. Pain Score  0 *  Have you tolerated food without any problems? Yes.    Have you been able to return to your normal activities? Yes.    Do you have any questions about your discharge instructions: Diet   No. Medications  No. Follow up visit  No.  Do you have questions or concerns about your Care? No.  Actions: * If pain score is 4 or above: No action needed, pain <4.

## 2021-09-18 DIAGNOSIS — J441 Chronic obstructive pulmonary disease with (acute) exacerbation: Secondary | ICD-10-CM | POA: Diagnosis not present

## 2021-09-18 DIAGNOSIS — Z681 Body mass index (BMI) 19 or less, adult: Secondary | ICD-10-CM | POA: Diagnosis not present

## 2021-09-18 DIAGNOSIS — R109 Unspecified abdominal pain: Secondary | ICD-10-CM | POA: Diagnosis not present

## 2021-09-23 ENCOUNTER — Encounter: Payer: Self-pay | Admitting: Internal Medicine

## 2021-09-28 ENCOUNTER — Other Ambulatory Visit: Payer: Self-pay | Admitting: Cardiology

## 2021-09-29 DIAGNOSIS — Z23 Encounter for immunization: Secondary | ICD-10-CM | POA: Diagnosis not present

## 2021-10-22 ENCOUNTER — Ambulatory Visit (HOSPITAL_COMMUNITY)
Admission: RE | Admit: 2021-10-22 | Discharge: 2021-10-22 | Disposition: A | Discharge status: Home / Self Care | Payer: Medicare HMO | Source: Ambulatory Visit | Attending: Physician Assistant | Admitting: Physician Assistant

## 2021-10-22 ENCOUNTER — Inpatient Hospital Stay: Payer: Medicare HMO | Attending: Hematology

## 2021-10-22 DIAGNOSIS — D472 Monoclonal gammopathy: Secondary | ICD-10-CM | POA: Insufficient documentation

## 2021-10-22 DIAGNOSIS — R69 Illness, unspecified: Secondary | ICD-10-CM | POA: Diagnosis not present

## 2021-10-22 DIAGNOSIS — F17210 Nicotine dependence, cigarettes, uncomplicated: Secondary | ICD-10-CM | POA: Insufficient documentation

## 2021-10-22 LAB — CBC WITH DIFFERENTIAL/PLATELET
Abs Immature Granulocytes: 0.01 10*3/uL (ref 0.00–0.07)
Basophils Absolute: 0.1 10*3/uL (ref 0.0–0.1)
Basophils Relative: 1 %
Eosinophils Absolute: 0.5 10*3/uL (ref 0.0–0.5)
Eosinophils Relative: 7 %
HCT: 41.5 % (ref 36.0–46.0)
Hemoglobin: 13.7 g/dL (ref 12.0–15.0)
Immature Granulocytes: 0 %
Lymphocytes Relative: 30 %
Lymphs Abs: 2 10*3/uL (ref 0.7–4.0)
MCH: 31.1 pg (ref 26.0–34.0)
MCHC: 33 g/dL (ref 30.0–36.0)
MCV: 94.3 fL (ref 80.0–100.0)
Monocytes Absolute: 0.4 10*3/uL (ref 0.1–1.0)
Monocytes Relative: 7 %
Neutro Abs: 3.7 10*3/uL (ref 1.7–7.7)
Neutrophils Relative %: 55 %
Platelets: 332 10*3/uL (ref 150–400)
RBC: 4.4 MIL/uL (ref 3.87–5.11)
RDW: 13.8 % (ref 11.5–15.5)
WBC: 6.6 10*3/uL (ref 4.0–10.5)
nRBC: 0 % (ref 0.0–0.2)

## 2021-10-22 LAB — COMPREHENSIVE METABOLIC PANEL
ALT: 23 U/L (ref 0–44)
AST: 23 U/L (ref 15–41)
Albumin: 4.1 g/dL (ref 3.5–5.0)
Alkaline Phosphatase: 64 U/L (ref 38–126)
Anion gap: 6 (ref 5–15)
BUN: 17 mg/dL (ref 8–23)
CO2: 24 mmol/L (ref 22–32)
Calcium: 9.1 mg/dL (ref 8.9–10.3)
Chloride: 107 mmol/L (ref 98–111)
Creatinine, Ser: 0.7 mg/dL (ref 0.44–1.00)
GFR, Estimated: >60 mL/min (ref >60)
Glucose, Bld: 100 mg/dL — ABNORMAL HIGH (ref 70–99)
Potassium: 3.5 mmol/L (ref 3.5–5.1)
Sodium: 137 mmol/L (ref 135–145)
Total Bilirubin: 0.5 mg/dL (ref 0.3–1.2)
Total Protein: 8.3 g/dL — ABNORMAL HIGH (ref 6.5–8.1)

## 2021-10-22 LAB — LACTATE DEHYDROGENASE: LDH: 98 U/L (ref 98–192)

## 2021-10-23 LAB — KAPPA/LAMBDA LIGHT CHAINS
Kappa free light chain: 274 mg/L — ABNORMAL HIGH (ref 3.3–19.4)
Kappa, lambda light chain ratio: 42.15 — ABNORMAL HIGH (ref 0.26–1.65)
Lambda free light chains: 6.5 mg/L (ref 5.7–26.3)

## 2021-10-26 LAB — PROTEIN ELECTROPHORESIS, SERUM
A/G Ratio: 1.1 (ref 0.7–1.7)
Albumin ELP: 4 g/dL (ref 2.9–4.4)
Alpha-1-Globulin: 0.3 g/dL (ref 0.0–0.4)
Alpha-2-Globulin: 0.7 g/dL (ref 0.4–1.0)
Beta Globulin: 0.8 g/dL (ref 0.7–1.3)
Gamma Globulin: 1.9 g/dL — ABNORMAL HIGH (ref 0.4–1.8)
Globulin, Total: 3.7 g/dL (ref 2.2–3.9)
M-Spike, %: 1.6 g/dL — ABNORMAL HIGH
Total Protein ELP: 7.7 g/dL (ref 6.0–8.5)

## 2021-10-28 NOTE — Progress Notes (Signed)
[facility] [place], [place] [address]   CLINIC:  Medical Oncology/Hematology  PCP:  [facility], [place] [address] [phone]   REASON FOR VISIT:  Follow-up for MGUS  PRIOR THERAPY: None  CURRENT THERAPY: Observation  INTERVAL HISTORY:  Ms. Grossberg 66 y.o. female returns for routine follow-up of her MGUS.  She was last seen by [name] PA-C on 08/27/2021.  She denies any new bone pain or recent fractures.  She does have chronic intermittent left-sided ankle pain due to remote history of left ankle fracture, as well as intermittent back pain related to chronic T7 compression fracture.  She denies any B symptoms such as fever, chills, or night sweats.  No new neurologic symptoms such as tinnitus, new-onset hearing loss, blurred vision, headache, or dizziness.  Denies any numbness or tingling in hands or feet.  No thromboembolic events since her last visit.   No new masses or lymphadenopathy per her report. She reports continued fatigue, which is slightly worse than her last visit, which she relates to her recent COPD exacerbation.  At her last visit, she had noted significant GI symptoms including bloating, epigastric pain, RUQ pain, and back pain after eating.  She had EGD and colonoscopy and was told that she had acid reflux.  She has been feeling much better after starting antireflux medications.  She has 60% energy and 80% appetite. She has started to regain some of the weight that she previously lost to from her GI symptoms.   REVIEW OF SYSTEMS:    Review of Systems  Constitutional:  Positive for fatigue. Negative for appetite change, chills, diaphoresis, fever and unexpected weight change.  HENT:   Negative for lump/mass and nosebleeds.   Eyes:  Negative for eye problems.  Respiratory:  Positive for shortness of breath (COPD). Negative for cough and hemoptysis.   Cardiovascular:  Negative for chest pain,  leg swelling and palpitations.  Gastrointestinal:  Negative for abdominal pain, blood in stool, constipation, diarrhea, nausea and vomiting.  Genitourinary:  Negative for hematuria.   Musculoskeletal:  Positive for back pain.  Skin: Negative.   Neurological:  Negative for dizziness, headaches and light-headedness.  Hematological:  Does not bruise/bleed easily.  Psychiatric/Behavioral:  Positive for sleep disturbance. The patient is nervous/anxious.     PAST MEDICAL/SURGICAL HISTORY:  Past Medical History:  Diagnosis Date   Anxiety    COPD (chronic obstructive pulmonary disease) (HCC)    Gallstones    MGUS (monoclonal gammopathy of unknown significance)    Osteoporosis    Past Surgical History:  Procedure Laterality Date   ABDOMINAL HYSTERECTOMY     APPENDECTOMY     BONE BIOPSY Left 03/25/2014   BONE MARROW ASPIRATION Left 03/25/2014   BONE MARROW BIOPSY Left 03/19/2014   CESAREAN SECTION     x2    SOCIAL HISTORY:  Social History   Socioeconomic History   Marital status: Divorced    Spouse name: Not on file   Number of children: 2   Years of education: Not on file   Highest education level: Not on file  Occupational History   Occupation: Disabled  Tobacco Use   Smoking status: Every Day    Packs/day: 0.50    Types: Cigarettes    Last attempt to quit: 07/11/2017    Years since quitting: 4.3   Smokeless tobacco: Former    Quit date: 07/31/2012  Vaping Use   Vaping Use: Never used  Substance and  Sexual Activity   Alcohol use: Yes    Comment: occasionally   Drug use: No   Sexual activity: Not Currently  Other Topics Concern   Not on file  Social History Narrative   Not on file   Social Determinants of Health   Financial Resource Strain: Not on file  Food Insecurity: Not on file  Transportation Needs: Not on file  Physical Activity: Not on file  Stress: Not on file  Social Connections: Not on file  Intimate Partner Violence: Not on file    FAMILY  HISTORY:  Family History  Problem Relation Age of Onset   Arthritis Mother    Heart failure Father    Colon cancer Maternal Grandmother    Esophageal cancer Paternal Grandfather    Stomach cancer Neg Hx    Colon polyps Neg Hx     CURRENT MEDICATIONS:  Outpatient Encounter Medications as of 10/29/2021  Medication Sig   albuterol (PROVENTIL HFA;VENTOLIN HFA) 108 (90 BASE) MCG/ACT inhaler Inhale 2 puffs into the lungs every 6 (six) hours as needed for wheezing or shortness of breath. Reported on 02/06/2015   albuterol (PROVENTIL) (2.5 MG/3ML) 0.083% nebulizer solution Take 2.5 mg by nebulization every 6 (six) hours as needed for wheezing or shortness of breath.   alendronate (FOSAMAX) 70 MG tablet TAKE ONE TABLET (70MG TOTAL) BY MOUTH ONCE A WEEK   ALPRAZolam (XANAX) 1 MG tablet Take 1 mg by mouth at bedtime as needed.   aspirin EC 81 MG tablet Take 1 tablet (81 mg total) by mouth daily. Swallow whole.   budesonide-formoterol (SYMBICORT) 160-4.5 MCG/ACT inhaler Inhale 2 puffs into the lungs 2 (two) times daily.   Calcium Carbonate-Vitamin D 600-200 MG-UNIT TABS Take 1 tablet by mouth 2 (two) times daily.   cholecalciferol (VITAMIN D3) 25 MCG (1000 UNIT) tablet Take 1,000 Units by mouth daily.   ibuprofen (ADVIL,MOTRIN) 600 MG tablet Take 600 mg by mouth as needed.   ipratropium-albuterol (DUONEB) 0.5-2.5 (3) MG/3ML SOLN Take 3 mLs by nebulization every 4 (four) hours as needed.   omeprazole (PRILOSEC) 40 MG capsule Take 1 capsule (40 mg total) by mouth 2 (two) times daily.   rosuvastatin (CRESTOR) 5 MG tablet TAKE ONE TABLET (5 MG TOTAL) BY MOUTH DAILY.   No facility-administered encounter medications on file as of 10/29/2021.    ALLERGIES:  Allergies  Allergen Reactions   Avelox [Moxifloxacin Hcl In Nacl] Rash    PHYSICAL EXAM:    ECOG PERFORMANCE STATUS: 1 - Symptomatic but completely ambulatory  There were no vitals filed for this visit. There were no vitals filed for this  visit. Physical Exam Constitutional:      Appearance: Normal appearance.  HENT:     Head: Normocephalic and atraumatic.     Mouth/Throat:     Mouth: Mucous membranes are moist.  Eyes:     Extraocular Movements: Extraocular movements intact.     Pupils: Pupils are equal, round, and reactive to light.  Cardiovascular:     Rate and Rhythm: Normal rate and regular rhythm.     Pulses: Normal pulses.     Heart sounds: Normal heart sounds.  Pulmonary:     Effort: Pulmonary effort is normal.     Comments: Coarse breath sounds in all lung fields Abdominal:     General: Bowel sounds are normal.     Palpations: Abdomen is soft.     Tenderness: There is no abdominal tenderness.  Musculoskeletal:        General:  No swelling.     Right lower leg: No edema.     Left lower leg: No edema.  Lymphadenopathy:     Cervical: No cervical adenopathy.  Skin:    General: Skin is warm and dry.  Neurological:     General: No focal deficit present.     Mental Status: She is alert and oriented to person, place, and time.  Psychiatric:        Mood and Affect: Mood normal.        Behavior: Behavior normal.    LABORATORY DATA:  I have reviewed the labs as listed.  CBC    Component Value Date/Time   WBC 6.6 10/22/2021 1255   RBC 4.40 10/22/2021 1255   HGB 13.7 10/22/2021 1255   HGB 14.7 07/04/2021 1529   HCT 41.5 10/22/2021 1255   HCT 43.1 07/04/2021 1529   PLT 332 10/22/2021 1255   PLT 307 07/04/2021 1529   MCV 94.3 10/22/2021 1255   MCV 91 07/04/2021 1529   MCH 31.1 10/22/2021 1255   MCHC 33.0 10/22/2021 1255   RDW 13.8 10/22/2021 1255   RDW 12.8 07/04/2021 1529   LYMPHSABS 2.0 10/22/2021 1255   LYMPHSABS 2.7 07/04/2021 1529   MONOABS 0.4 10/22/2021 1255   EOSABS 0.5 10/22/2021 1255   EOSABS 0.4 07/04/2021 1529   BASOSABS 0.1 10/22/2021 1255   BASOSABS 0.1 07/04/2021 1529      Latest Ref Rng & Units 10/22/2021   12:55 PM 08/17/2021    1:38 PM 08/17/2021   12:20 AM  CMP  Glucose 70  - 99 mg/dL 100  97  156   BUN 8 - 23 mg/dL _0 Creatinine 0.44 - 1.00 mg/dL 0.70  0.66  0.84   Sodium 135 - 145 mmol/L 137  141  138   Potassium 3.5 - 5.1 mmol/L 3.5  3.4  3.5   Chloride 98 - 111 mmol/L 107  111  107   CO2 22 - 32 mmol/L _1 Calcium 8.9 - 10.3 mg/dL 9.1  9.5  9.0   Total Protein 6.5 - 8.1 g/dL 8.3  7.5    Total Bilirubin 0.3 - 1.2 mg/dL 0.5  0.4    Alkaline Phos 38 - 126 U/L 64  50    AST 15 - 41 U/L 23  25    ALT 0 - 44 U/L 23  26      DIAGNOSTIC IMAGING:  I have independently reviewed the relevant imaging and discussed with the patient.  ASSESSMENT & PLAN: 1.  IgG kappa MGUS - Bone marrow biopsy in March 2016 shows 9% plasma cells with kappa restriction. (Previous biopsy in August 2010 showed 3% plasma cells) - Skeletal survey from 07/11/2017 shows stable T7 compression deformity with no new lytic lesions - Skeletal survey (12/22/2020): Diffuse osteopenia with multiple small lucencies noted over the skull/questionable faint lucency noted over the proximal right humerus and questionable small lucency noted over the right posterior eighth rib; myeloma or metastatic disease cannot be excluded, per radiology report. - Repeat skeletal survey (10/22/2021): No lytic or blastic lesions identified (previously identified lesions are not definitively seen on this study).  Diffuse osteopenia.  Chronic fracture of T7. - Myeloma panel (10/22/2021) is essentially stable. M spike 1.6 Elevated kappa light chains to 74.0, ratio 42.15 Normal LDH No CRAB features (creatinine 0.70, calcium 9.1, Hgb 13.7) - Additional labs (08/17/2021): Hgb 13.3, creatinine 0.66, calcium 9.5 - She  does not report any new onset bone pains, B symptoms, or recurrent infections      - PLAN: We will continue to watch closely with repeat MGUS/myeloma panel in 3 months   - Will consider bone marrow biopsy and/or PET if significant progression or development of CRAB signs/symptoms     2.  Smoking  history: - She smoked 1 pack/day for the past 42 years. - CT lung on 11/24/2019 returned showing a Lung-RADS 2S - LDCT chest (04/07/2021): Lung RADS 2S, benign appearance or behavior - She is trying to quit and has not smoked a cigarette for the past 3 days - PLAN: Continue annual LDCT chest, next due March 2024.     3.  Osteoporosis: - Bone density test on 08/13/2019 shows T score -3.4. - Most recent bone density/DEXA scan (08/17/2021): Osteoporosis with T-score -3.5 - She is taking Fosamax weekly since February 2016 and also continue to take calcium and vitamin D. - PLAN: Continue Fosamax, calcium, and vitamin D.  We will repeat bone density scan in July 2026.  4.  Health maintenance: - Mammogram on 03/04/2021 was BI-RADS Category 1, negative - PLAN: Continue screening mammograms via PCP   PLAN SUMMARY & DISPOSITION: Labs in 3 months Office visit 1 week after labs   All questions were answered. The patient knows to call the clinic with any problems, questions or concerns.  Medical decision making: Moderate    Time spent on visit: I spent 23 minutes counseling the patient face to face. The total time spent in the appointment was 30 minutes and more than 50% was on counseling.   Harriett Rush, PA-C  10/29/2021 6:01 PM

## 2021-10-29 ENCOUNTER — Inpatient Hospital Stay (HOSPITAL_BASED_OUTPATIENT_CLINIC_OR_DEPARTMENT_OTHER): Payer: Medicare HMO | Admitting: Physician Assistant

## 2021-10-29 VITALS — BP 101/60 | HR 87 | Temp 98.3°F | Resp 16 | Wt 93.3 lb

## 2021-10-29 DIAGNOSIS — D472 Monoclonal gammopathy: Secondary | ICD-10-CM | POA: Diagnosis not present

## 2021-10-29 DIAGNOSIS — R69 Illness, unspecified: Secondary | ICD-10-CM | POA: Diagnosis not present

## 2021-10-29 NOTE — Patient Instructions (Addendum)
[facility] [place] [facility] Discharge Instructions  You were seen today by [name] PA-C for your MGUS ("monoclonal gammopathy of undetermined significance").  As we have previously discussed, MGUS is essentially an abnormal protein in your blood which can progress to a type of cancer called multiple myeloma.  At this time, your blood work does not show any sign of having progressed to multiple myeloma.  Your blood work is stable and slightly improved from before.  Your Xrays did not show any abnormal spots on your bones.  Continue to take Fosamax once a week for your osteoporosis.  FOLLOW-UP APPOINTMENT: Office visit in 3 months, after labs   - - - - - - - - - - - - - - - - - -    Thank you for choosing [place] [facility] to provide your oncology and hematology care.  To afford each patient quality time with our provider, please arrive at least 15 minutes before your scheduled appointment time.   If you have a lab appointment with the [facility] please come in thru the Main Entrance and check in at the main information desk.  You need to re-schedule your appointment should you arrive 10 or more minutes late.  We strive to give you quality time with our providers, and arriving late affects you and other patients whose appointments are after yours.  Also, if you no show three or more times for appointments you may be dismissed from the clinic at the providers discretion.     Again, thank you for choosing [facility].  Our hope is that these requests will decrease the amount of time that you wait before being seen by our physicians.       _____________________________________________________________  Should you have questions after your visit to [facility], please contact our office at [phone] and follow the prompts.  Our office hours are 8:00 a.m. and 4:30 p.m. Monday - Friday.  Please note that  voicemails left after 4:00 p.m. may not be returned until the following business day.  We are closed weekends and major holidays.  You do have access to a nurse 24-7, just call the main number to the clinic [phone] and do not press any options, hold on the line and a nurse will answer the phone.    For prescription refill requests, have your pharmacy contact our office and allow 72 hours.    Due to Covid, you will need to wear a mask upon entering the hospital. If you do not have a mask, a mask will be given to you at the Main Entrance upon arrival. For doctor visits, patients may have 1 support person age 17 or older with them. For treatment visits, patients can not have anyone with them due to social distancing guidelines and our immunocompromised population.

## 2021-10-30 DIAGNOSIS — R69 Illness, unspecified: Secondary | ICD-10-CM | POA: Diagnosis not present

## 2021-10-30 DIAGNOSIS — Z681 Body mass index (BMI) 19 or less, adult: Secondary | ICD-10-CM | POA: Diagnosis not present

## 2021-10-30 DIAGNOSIS — F5101 Primary insomnia: Secondary | ICD-10-CM | POA: Diagnosis not present

## 2021-10-30 DIAGNOSIS — J449 Chronic obstructive pulmonary disease, unspecified: Secondary | ICD-10-CM | POA: Diagnosis not present

## 2021-11-03 ENCOUNTER — Encounter: Payer: Self-pay | Admitting: Internal Medicine

## 2021-11-03 ENCOUNTER — Other Ambulatory Visit (INDEPENDENT_AMBULATORY_CARE_PROVIDER_SITE_OTHER): Payer: Medicare HMO

## 2021-11-03 ENCOUNTER — Ambulatory Visit: Payer: Medicare HMO | Admitting: Internal Medicine

## 2021-11-03 VITALS — BP 98/68 | HR 93 | Ht 60.0 in | Wt 93.8 lb

## 2021-11-03 DIAGNOSIS — R5383 Other fatigue: Secondary | ICD-10-CM

## 2021-11-03 DIAGNOSIS — R1013 Epigastric pain: Secondary | ICD-10-CM | POA: Diagnosis not present

## 2021-11-03 DIAGNOSIS — K2990 Gastroduodenitis, unspecified, without bleeding: Secondary | ICD-10-CM

## 2021-11-03 DIAGNOSIS — K2970 Gastritis, unspecified, without bleeding: Secondary | ICD-10-CM

## 2021-11-03 LAB — IBC + FERRITIN
Ferritin: 134.5 ng/mL (ref 10.0–291.0)
Iron: 81 ug/dL (ref 42–145)
Saturation Ratios: 27.6 % (ref 20.0–50.0)
TIBC: 294 ug/dL (ref 250.0–450.0)
Transferrin: 210 mg/dL — ABNORMAL LOW (ref 212.0–360.0)

## 2021-11-03 LAB — VITAMIN B12: Vitamin B-12: 276 pg/mL (ref 211–911)

## 2021-11-03 LAB — VITAMIN D 25 HYDROXY (VIT D DEFICIENCY, FRACTURES): VITD: 58.79 ng/mL (ref 30.00–100.00)

## 2021-11-03 MED ORDER — PANTOPRAZOLE SODIUM 40 MG PO TBEC: 40.0000 mg | DELAYED_RELEASE_TABLET | Freq: Two times a day (BID) | ORAL | 11 refills | Status: DC

## 2021-11-03 NOTE — Patient Instructions (Signed)
Your provider has requested that you go to the basement level for lab work before leaving today. Press "B" on the elevator. The lab is located at the first door on the left as you exit the elevator.  Stop taking omeprazole.   We have sent the following medications to your pharmacy for you to pick up at your convenience: pantoprazole.   You have been given low-fodmap diet.   The [facility] providers would like to encourage you to use [facility] [address] to communicate with providers for non-urgent requests or questions.  Due to long hold times on the telephone, sending your provider a message by [facility] may be a faster and more efficient way to get a response.  Please allow 48 business hours for a response.  Please remember that this is for non-urgent requests.   Due to recent changes in healthcare laws, you may see the results of your imaging and laboratory studies on MyChart before your provider has had a chance to review them.  We understand that in some cases there may be results that are confusing or concerning to you. Not all laboratory results come back in the same time frame and the provider may be waiting for multiple results in order to interpret others.  Please give [place] 48 hours in order for your provider to thoroughly review all the results before contacting the office for clarification of your results.

## 2021-11-03 NOTE — Progress Notes (Signed)
Chief Complaint: Bloating, abdominal discomfort  HPI : 66 year old female with history of COPD, gallstones, MGUS, osteoporosis, anxiety presents for follow up of bloating and abdominal discomfort  Interval History: Patient does have better chest pressure benefit from the omeprazole medication but then develops bloating as the day goes on. She is no longer feeling pressure in her chest. She does still feel a tightness extending from the back and coming around. She has been feeling more tired recently. Mostly in the late evening the bloating is still present. Denies N&V. Denies ab pain. She has having at least 1 BM once a day or every other day.   Wt Readings from Last 3 Encounters:  11/03/21 93 lb 12.8 oz (42.5 kg)  10/29/21 93 lb 4.1 oz (42.3 kg)  09/16/21 90 lb (40.8 kg)   Current Outpatient Medications  Medication Sig Dispense Refill   acetaminophen (TYLENOL) 500 MG tablet Take 500 mg by mouth every 6 (six) hours as needed.     albuterol (PROVENTIL HFA;VENTOLIN HFA) 108 (90 BASE) MCG/ACT inhaler Inhale 2 puffs into the lungs every 6 (six) hours as needed for wheezing or shortness of breath. Reported on 02/06/2015     albuterol (PROVENTIL) (2.5 MG/3ML) 0.083% nebulizer solution Take 2.5 mg by nebulization every 6 (six) hours as needed for wheezing or shortness of breath.     alendronate (FOSAMAX) 70 MG tablet TAKE ONE TABLET ('70MG'$  TOTAL) BY MOUTH ONCE A WEEK 12 tablet 1   ALPRAZolam (XANAX) 1 MG tablet Take 1 mg by mouth at bedtime as needed.     aspirin EC 81 MG tablet Take 1 tablet (81 mg total) by mouth daily. Swallow whole. 90 tablet 3   budesonide-formoterol (SYMBICORT) 160-4.5 MCG/ACT inhaler Inhale 2 puffs into the lungs 2 (two) times daily. 1 Inhaler 1   cholecalciferol (VITAMIN D3) 25 MCG (1000 UNIT) tablet Take 2,000 Units by mouth daily.     ipratropium-albuterol (DUONEB) 0.5-2.5 (3) MG/3ML SOLN Take 3 mLs by nebulization every 4 (four) hours as needed. 360 mL 0   omeprazole  (PRILOSEC) 40 MG capsule Take 1 capsule (40 mg total) by mouth 2 (two) times daily. 60 capsule 1   rosuvastatin (CRESTOR) 5 MG tablet TAKE ONE TABLET (5 MG TOTAL) BY MOUTH DAILY. 100 tablet 2   No current facility-administered medications for this visit.   Allergies  Allergen Reactions   Avelox [Moxifloxacin Hcl In Nacl] Rash   Review of Systems: All systems reviewed and negative except where noted in HPI.   Physical Exam: BP 98/68   Pulse 93   Ht 5' (1.524 m)   Wt 93 lb 12.8 oz (42.5 kg)   SpO2 96%   BMI 18.32 kg/m  Constitutional: Pleasant,well-developed, female in no acute distress. HEENT: Normocephalic and atraumatic. Conjunctivae are normal. No scleral icterus. Cardiovascular: Normal rate, regular rhythm.  Pulmonary/chest: Effort normal and breath sounds normal. No wheezing, rales or rhonchi. Abdominal: Soft, nondistended, slightly uncomfortable in the epigastric area. Bowel sounds active throughout. There are no masses palpable. No hepatomegaly. Extremities: No edema Neurological: Alert and oriented to person place and time. Skin: Skin is warm and dry. No rashes noted. Psychiatric: Normal mood and affect. Behavior is normal.  Labs 03/2021: CMP unremarkable.  Labs 06/2021: CBC unremarkable.  Labs 07/2021: TSH nml.   Labs 10/2021: CMP and CBC unremarkable.  Ab U/S 06/16/21: IMPRESSION: Cholelithiasis without secondary signs of acute cholecystitis. Possible 4 mm left renal stone.  HIDA scan 08/03/21: FINDINGS: Prompt uptake and  biliary excretion of activity by the liver is seen. Gallbladder activity is visualized, consistent with patency of cystic duct. Biliary activity passes into small bowel, consistent with patent common bile duct. Calculated gallbladder ejection fraction is 84%. (Normal gallbladder ejection fraction with Ensure is greater than 33%.) IMPRESSION: 1.  Patent cystic and common bile ducts. 2.  Normal gallbladder ejection fraction.  EGD 09/16/21: -  White nummular lesions in esophageal mucosa. Biopsied. - Nodular mucosa in the esophagus. Biopsied. - Gastritis. Biopsied. - Duodenitis. Biopsied. Path: 1. Surgical [P], duodenal - DUODENAL MUCOSA WITH NO SPECIFIC HISTOPATHOLOGIC CHANGES - NEGATIVE FOR INCREASED INTRAEPITHELIAL LYMPHOCYTES OR VILLOUS ARCHITECTURAL CHANGES 2. Surgical [P], gastric - GASTRIC ANTRAL AND OXYNTIC MUCOSA WITH NO SPECIFIC HISTOPATHOLOGIC CHANGES - HELICOBACTER PYLORI-LIKE ORGANISMS ARE NOT IDENTIFIED ON ROUTINE H&E STAIN 3. Surgical [P], nodular GE junction mucosa - ESOPHAGEAL SQUAMOUS AND CARDIAC MUCOSA WITH REFLUX-ASSOCIATED CHANGES - NEGATIVE FOR INTESTINAL METAPLASIA OR DYSPLASIA 4. Surgical [P], esophagus - ESOPHAGEAL SQUAMOUS MUCOSA WITH MILD VASCULAR CONGESTION, AND FOCAL SQUAMOUS BALLOONING, SUGGESTIVE OF REFLUX ESOPHAGITIS - NEGATIVE FOR INCREASED INTRAEPITHELIAL EOSINOPHILS  Colonoscopy 09/16/21: - The examined portion of the ileum was normal. - One 3 mm polyp in the cecum, removed with a cold snare. Resected and retrieved. - Two 3 to 5 mm polyps in the sigmoid colon, removed with a cold snare. Resected and retrieved. - Diverticulosis in the sigmoid colon. - Non-bleeding internal hemorrhoids. Path: 5. Surgical [P], colon, cecum, polyp (1) - TUBULAR ADENOMA WITHOUT HIGH-GRADE DYSPLASIA OR MALIGNANCY 6. Surgical [P], colon, sigmoid, polyp (2) - TUBULAR ADENOMA WITHOUT HIGH-GRADE DYSPLASIA OR MALIGNANCY - HYPERPLASTIC POLYP(S)  ASSESSMENT AND PLAN: Dyspepsia Fatigue Epigastric ab discomfort - improved with PPI therapy Weight loss History of colon polyps Gallstones Patient still has some symptoms of dyspepsia. Will trial her on the low FODMAP diet to see if this helps with her symptoms. Patient also described some fatigue today so will check some labs to rule out vitamin deficiencies. Patient would like to try another PPI so will switch her to pantoprazole to see if this helps with her  symptoms - Low FODMAP diet - Check vitamin D level, ferritin/IBC, vitamin B12 - Switch from omeprazole 40 mg BID to pantoprazole 40 mg BID - RTC 2 months  Christia Reading, MD  I spent 42 minutes of time, including in depth chart review, independent review of results as outlined above, communicating results with the patient directly, face-to-face time with the patient, coordinating care, ordering studies and medications as appropriate, and documentation.

## 2021-11-16 ENCOUNTER — Telehealth: Payer: Self-pay | Admitting: Internal Medicine

## 2021-11-16 MED ORDER — OMEPRAZOLE 40 MG PO CPDR: 40.0000 mg | DELAYED_RELEASE_CAPSULE | Freq: Two times a day (BID) | ORAL | 2 refills | Status: DC

## 2021-11-16 NOTE — Telephone Encounter (Signed)
Patient aware that Omeprazole '40mg'$  one capsule BID was sent to [facility].  Patient agreed to plan and verbalized understanding.  No further questions.

## 2021-11-16 NOTE — Telephone Encounter (Signed)
OK to send omeprazole '40mg'$  one capsule BID?  Please advise.

## 2021-11-16 NOTE — Telephone Encounter (Signed)
Patient called states she switched again to Omeprazole because it works better for her than the Pantoprazole. Requesting refills on the Omeprazole declining the refills for Pantoprazole.

## 2021-12-19 ENCOUNTER — Ambulatory Visit
Admission: EM | Admit: 2021-12-19 | Discharge: 2021-12-19 | Disposition: A | Discharge status: Home / Self Care | Payer: Medicare HMO | Attending: Family Medicine | Admitting: Family Medicine

## 2021-12-19 DIAGNOSIS — J22 Unspecified acute lower respiratory infection: Secondary | ICD-10-CM | POA: Diagnosis not present

## 2021-12-19 DIAGNOSIS — R0602 Shortness of breath: Secondary | ICD-10-CM | POA: Diagnosis not present

## 2021-12-19 DIAGNOSIS — J441 Chronic obstructive pulmonary disease with (acute) exacerbation: Secondary | ICD-10-CM | POA: Diagnosis not present

## 2021-12-19 HISTORY — DX: Gastro-esophageal reflux disease without esophagitis: K21.9

## 2021-12-19 MED ORDER — METHYLPREDNISOLONE SODIUM SUCC 125 MG IJ SOLR
80.0000 mg | Freq: Once | INTRAMUSCULAR | Status: AC
  Administered 2021-12-19: 80 mg via INTRAMUSCULAR

## 2021-12-19 MED ORDER — AZITHROMYCIN 250 MG PO TABS: ORAL_TABLET | ORAL | 0 refills | Status: DC

## 2021-12-19 MED ORDER — PREDNISONE 20 MG PO TABS: 40.0000 mg | ORAL_TABLET | Freq: Every day | ORAL | 0 refills | Status: DC

## 2021-12-19 NOTE — ED Provider Notes (Signed)
[facility]    CSN: [id number] Arrival date & time: 12/19/21  0927      History   Chief Complaint No chief complaint on file.   HPI Misty Richardson is a 65 y.o. female.   Patient presenting today with 4-day history of progressively worsening chest congestion, wheezing, shortness of breath, chest tightness.  Denies fever, chills, body aches, chest pain, shortness of breath.  History of COPD with frequent exacerbations, using her typical Symbicort and albuterol regimen as well as DuoNebs several times daily since onset of symptoms.  Very mild short-term relief with these medications.  No new sick contacts recently.    Past Medical History:  Diagnosis Date   Anxiety    COPD (chronic obstructive pulmonary disease) (HCC)    Gallstones    GERD (gastroesophageal reflux disease)    MGUS (monoclonal gammopathy of unknown significance)    Osteoporosis     Patient Active Problem List   Diagnosis Date Noted   Osteoporosis 03/14/2014   Tobacco use disorder 09/19/2012   MGUS (monoclonal gammopathy of unknown significance) 09/05/2012   COPD (chronic obstructive pulmonary disease) (HCC) 09/05/2012    Past Surgical History:  Procedure Laterality Date   ABDOMINAL HYSTERECTOMY     APPENDECTOMY     BONE BIOPSY Left 03/25/2014   BONE MARROW ASPIRATION Left 03/25/2014   BONE MARROW BIOPSY Left 03/19/2014   CESAREAN SECTION     x2    OB History     Gravida  2   Para  2   Term      Preterm      AB      Living  2      SAB      IAB      Ectopic      Multiple      Live Births               Home Medications    Prior to Admission medications   Medication Sig Start Date End Date Taking? Authorizing Provider  azithromycin (ZITHROMAX) 250 MG tablet Take first 2 tablets together, then 1 every day until finished. 12/19/21  Yes Lane, Rachel Elizabeth, PA-C  predniSONE (DELTASONE) 20 MG tablet Take 2 tablets (40 mg total) by mouth daily with breakfast.  12/19/21  Yes Lane, Rachel Elizabeth, PA-C  acetaminophen (TYLENOL) 500 MG tablet Take 500 mg by mouth every 6 (six) hours as needed.    [provider]  albuterol (PROVENTIL HFA;VENTOLIN HFA) 108 (90 BASE) MCG/ACT inhaler Inhale 2 puffs into the lungs every 6 (six) hours as needed for wheezing or shortness of breath. Reported on 02/06/2015    [provider]  albuterol (PROVENTIL) (2.5 MG/3ML) 0.083% nebulizer solution Take 2.5 mg by nebulization every 6 (six) hours as needed for wheezing or shortness of breath.    [provider]  alendronate (FOSAMAX) 70 MG tablet TAKE ONE TABLET (70MG TOTAL) BY MOUTH ONCE A WEEK 03/30/21   Pennington, Rebekah M, PA-C  ALPRAZolam (XANAX) 1 MG tablet Take 1 mg by mouth at bedtime as needed. 12/02/16   [provider]  aspirin EC 81 MG tablet Take 1 tablet (81 mg total) by mouth daily. Swallow whole. 03/27/20   McDowell, Samuel G, MD  budesonide-formoterol (SYMBICORT) 160-4.5 MCG/ACT inhaler Inhale 2 puffs into the lungs 2 (two) times daily. 09/23/12   Gosrani, Nimish C, MD  cholecalciferol (VITAMIN D3) 25 MCG (1000 UNIT) tablet Take 2,000 Units by mouth daily.      [provider]  ipratropium-albuterol (DUONEB) 0.5-2.5 (3) MG/3ML SOLN Take 3 mLs by nebulization every 4 (four) hours as needed. 09/17/21   Volney American, PA-C  omeprazole (PRILOSEC) 40 MG capsule Take 1 capsule (40 mg total) by mouth 2 (two) times daily. 11/16/21   Sharyn Creamer, MD  rosuvastatin (CRESTOR) 5 MG tablet TAKE ONE TABLET (5 MG TOTAL) BY MOUTH DAILY. 09/28/21   Satira Sark, MD    Family History Family History  Problem Relation Age of Onset   Arthritis Mother    Heart failure Father    Colon cancer Maternal Grandmother    Esophageal cancer Paternal Grandfather    Stomach cancer Neg Hx    Colon polyps Neg Hx     Social History Social History   Tobacco Use   Smoking status: Every Day    Packs/day: 0.50    Types: Cigarettes     Last attempt to quit: 07/11/2017    Years since quitting: 4.4   Smokeless tobacco: Former    Quit date: 07/31/2012  Vaping Use   Vaping Use: Never used  Substance Use Topics   Alcohol use: Yes    Comment: occasionally   Drug use: No     Allergies   Avelox [moxifloxacin hcl in nacl]   Review of Systems Review of Systems Per HPI  Physical Exam Triage Vital Signs ED Triage Vitals  Enc Vitals Group     BP 12/19/21 1038 112/78     Pulse Rate 12/19/21 1038 94     Resp 12/19/21 1038 20     Temp 12/19/21 1038 98 F (36.7 C)     Temp Source 12/19/21 1038 Oral     SpO2 12/19/21 1038 91 %     Weight --      Height --      Head Circumference --      Peak Flow --      Pain Score 12/19/21 1045 0     Pain Loc --      Pain Edu? --      Excl. in [place]? --    No data found.  Updated Vital Signs BP 112/78 (BP Location: Right Arm)   Pulse 94   Temp 98 F (36.7 C) (Oral)   Resp 20   SpO2 91%   Visual Acuity Right Eye Distance:   Left Eye Distance:   Bilateral Distance:    Right Eye Near:   Left Eye Near:    Bilateral Near:     Physical Exam Vitals and nursing note reviewed.  Constitutional:      Appearance: Normal appearance.  HENT:     Head: Atraumatic.     Right Ear: Tympanic membrane and external ear normal.     Left Ear: Tympanic membrane and external ear normal.     Nose: Nose normal. No congestion.     Mouth/Throat:     Mouth: Mucous membranes are moist.     Pharynx: Posterior oropharyngeal erythema present. No oropharyngeal exudate.  Eyes:     Extraocular Movements: Extraocular movements intact.     Conjunctiva/sclera: Conjunctivae normal.  Cardiovascular:     Rate and Rhythm: Normal rate and regular rhythm.     Heart sounds: Normal heart sounds.  Pulmonary:     Effort: Pulmonary effort is normal. No respiratory distress.     Breath sounds: Wheezing present. No rales.     Comments: Decreased breath sounds throughout Musculoskeletal:        General:  Normal range of motion.     Cervical back: Normal range of motion and neck supple.  Skin:    General: Skin is warm and dry.  Neurological:     Mental Status: She is alert and oriented to person, place, and time.  Psychiatric:        Mood and Affect: Mood normal.        Thought Content: Thought content normal.      UC Treatments / Results  Labs (all labs ordered are listed, but only abnormal results are displayed) Labs Reviewed - No data to display  EKG   Radiology No results found.  Procedures Procedures (including critical care time)  Medications Ordered in UC Medications  methylPREDNISolone sodium succinate (SOLU-MEDROL) 125 mg/2 mL injection 80 mg (80 mg Intramuscular Given 12/19/21 1124)    Initial Impression / Assessment and Plan / UC Course  I have reviewed the triage vital signs and the nursing notes.  Pertinent labs & imaging results that were available during my care of the patient were reviewed by me and considered in my medical decision making (see chart for details).     Treat for asthma exacerbation with IM Solu-Medrol, continued inhaler and DuoNeb regimen.  If worsening or not resolving over the next week, may start the oral prednisone and azithromycin.  Follow-up for recheck if still not resolving.  Final Clinical Impressions(s) / UC Diagnoses   Final diagnoses:  COPD exacerbation (HCC)  Lower respiratory infection  SOB (shortness of breath)     Discharge Instructions      We have given you a shot of steroid today that should last a good 4 to 5 days.  If this plus your nebulizer treatments and home inhalers do not help your symptoms, I have also sent over antibiotics and an oral steroid but try not to take them unless you are worsening at that point.    ED Prescriptions     Medication Sig Dispense Auth. Provider   predniSONE (DELTASONE) 20 MG tablet Take 2 tablets (40 mg total) by mouth daily with breakfast. 10 tablet Lane, Rachel Elizabeth,  PA-C   azithromycin (ZITHROMAX) 250 MG tablet Take first 2 tablets together, then 1 every day until finished. 6 tablet Lane, Rachel Elizabeth, PA-C      PDMP not reviewed this encounter.   Lane, Rachel Elizabeth, PA-C 12/19/21 1424  

## 2021-12-19 NOTE — Discharge Instructions (Signed)
We have given you a shot of steroid today that should last a good 4 to 5 days.  If this plus your nebulizer treatments and home inhalers do not help your symptoms, I have also sent over antibiotics and an oral steroid but try not to take them unless you are worsening at that point.

## 2021-12-19 NOTE — ED Triage Notes (Signed)
Pt reports she has chest congestion, ears itching right one mostly, legs hurting, coughing, SOB (has COPD) Wednesday. Wheezing Took alburetol and vics which helped a bit.

## 2021-12-28 ENCOUNTER — Other Ambulatory Visit: Payer: Self-pay | Admitting: Physician Assistant

## 2021-12-28 DIAGNOSIS — M810 Age-related osteoporosis without current pathological fracture: Secondary | ICD-10-CM

## 2022-01-05 ENCOUNTER — Encounter: Payer: Self-pay | Admitting: Internal Medicine

## 2022-01-05 ENCOUNTER — Ambulatory Visit: Payer: Medicare HMO | Admitting: Internal Medicine

## 2022-01-05 VITALS — BP 98/68 | HR 89 | Ht 60.0 in | Wt 97.1 lb

## 2022-01-05 DIAGNOSIS — R1013 Epigastric pain: Secondary | ICD-10-CM | POA: Diagnosis not present

## 2022-01-05 DIAGNOSIS — K219 Gastro-esophageal reflux disease without esophagitis: Secondary | ICD-10-CM

## 2022-01-05 NOTE — Patient Instructions (Signed)
_______________________________________________________  If you are age 66 or older, your body mass index should be between 23-30. Your Body mass index is 18.97 kg/m. If this is out of the aforementioned range listed, please consider follow up with your Primary Care Provider. ________________________________________________________  The [facility] providers would like to encourage you to use [facility] to communicate with providers for non-urgent requests or questions.  Due to long hold times on the telephone, sending your provider a message by [facility] may be a faster and more efficient way to get a response.  Please allow 48 business hours for a response.  Please remember that this is for non-urgent requests.  _______________________________________________________  We have sent the following medications to your pharmacy for you to pick up at your convenience:  Omeprazole  You will need a follow up in 6 months (June 2024).  We will contact you to schedule this appointment.   Thank you for entrusting me with your care and choosing [facility].  Dr Lorenso Courier

## 2022-01-05 NOTE — Progress Notes (Signed)
Chief Complaint: Bloating, abdominal discomfort  HPI : 66 year old female with history of COPD, gallstones, MGUS, osteoporosis, anxiety presents for follow up of bloating and abdominal discomfort  Interval History: She has been doing better overall. Sometimes she does still have some bloating. She has been avoiding spicy foods and tomatoes. She will have some burning in her stomach. She has been drinking more tea. She did give pantoprazole a trial but felt like the omeprazole worked better so she has switched back to the omeprazole. She is having on average one stool every day or every other day.  Wt Readings from Last 3 Encounters:  01/05/22 97 lb 2 oz (44.1 kg)  11/03/21 93 lb 12.8 oz (42.5 kg)  10/29/21 93 lb 4.1 oz (42.3 kg)   Current Outpatient Medications  Medication Sig Dispense Refill   acetaminophen (TYLENOL) 500 MG tablet Take 500 mg by mouth every 6 (six) hours as needed.     albuterol (PROVENTIL HFA;VENTOLIN HFA) 108 (90 BASE) MCG/ACT inhaler Inhale 2 puffs into the lungs every 6 (six) hours as needed for wheezing or shortness of breath. Reported on 02/06/2015     albuterol (PROVENTIL) (2.5 MG/3ML) 0.083% nebulizer solution Take 2.5 mg by nebulization every 6 (six) hours as needed for wheezing or shortness of breath.     alendronate (FOSAMAX) 70 MG tablet TAKE ONE TABLET ('70MG'$  TOTAL) BY MOUTH ONCE A WEEK 12 tablet 1   ALPRAZolam (XANAX) 1 MG tablet Take 1 mg by mouth at bedtime as needed.     aspirin EC 81 MG tablet Take 1 tablet (81 mg total) by mouth daily. Swallow whole. 90 tablet 3   budesonide-formoterol (SYMBICORT) 160-4.5 MCG/ACT inhaler Inhale 2 puffs into the lungs 2 (two) times daily. 1 Inhaler 1   cholecalciferol (VITAMIN D3) 25 MCG (1000 UNIT) tablet Take 2,000 Units by mouth daily.     ipratropium-albuterol (DUONEB) 0.5-2.5 (3) MG/3ML SOLN Take 3 mLs by nebulization every 4 (four) hours as needed. 360 mL 0   omeprazole (PRILOSEC) 40 MG capsule Take 1 capsule (40 mg  total) by mouth 2 (two) times daily. 60 capsule 2   predniSONE (DELTASONE) 20 MG tablet Take 2 tablets (40 mg total) by mouth daily with breakfast. 10 tablet 0   rosuvastatin (CRESTOR) 5 MG tablet TAKE ONE TABLET (5 MG TOTAL) BY MOUTH DAILY. 100 tablet 2   No current facility-administered medications for this visit.   Allergies  Allergen Reactions   Avelox [Moxifloxacin Hcl In Nacl] Rash   Review of Systems: All systems reviewed and negative except where noted in HPI.   Physical Exam: BP 98/68 (BP Location: Left Arm, Patient Position: Sitting, Cuff Size: Normal)   Pulse 89   Ht 5' (1.524 m)   Wt 97 lb 2 oz (44.1 kg)   SpO2 90%   BMI 18.97 kg/m  Constitutional: Pleasant,well-developed, female in no acute distress. HEENT: Normocephalic and atraumatic. Conjunctivae are normal. No scleral icterus. Cardiovascular: Normal rate, regular rhythm.  Pulmonary/chest: Effort normal and breath sounds normal. No wheezing, rales or rhonchi. Abdominal: Soft, nondistended, non-tender. Bowel sounds active throughout. There are no masses palpable. No hepatomegaly. Extremities: No edema Neurological: Alert and oriented to person place and time. Skin: Skin is warm and dry. No rashes noted. Psychiatric: Normal mood and affect. Behavior is normal.  Labs 03/2021: CMP unremarkable.  Labs 06/2021: CBC unremarkable.  Labs 07/2021: TSH nml.   Labs 10/2021: CMP and CBC unremarkable.  Ab U/S 06/16/21: IMPRESSION: Cholelithiasis without secondary  signs of acute cholecystitis. Possible 4 mm left renal stone.  HIDA scan 08/03/21: FINDINGS: Prompt uptake and biliary excretion of activity by the liver is seen. Gallbladder activity is visualized, consistent with patency of cystic duct. Biliary activity passes into small bowel, consistent with patent common bile duct. Calculated gallbladder ejection fraction is 84%. (Normal gallbladder ejection fraction with Ensure is greater than 33%.) IMPRESSION: 1.   Patent cystic and common bile ducts. 2.  Normal gallbladder ejection fraction.  EGD 09/16/21: - White nummular lesions in esophageal mucosa. Biopsied. - Nodular mucosa in the esophagus. Biopsied. - Gastritis. Biopsied. - Duodenitis. Biopsied. Path: 1. Surgical [P], duodenal - DUODENAL MUCOSA WITH NO SPECIFIC HISTOPATHOLOGIC CHANGES - NEGATIVE FOR INCREASED INTRAEPITHELIAL LYMPHOCYTES OR VILLOUS ARCHITECTURAL CHANGES 2. Surgical [P], gastric - GASTRIC ANTRAL AND OXYNTIC MUCOSA WITH NO SPECIFIC HISTOPATHOLOGIC CHANGES - HELICOBACTER PYLORI-LIKE ORGANISMS ARE NOT IDENTIFIED ON ROUTINE H&E STAIN 3. Surgical [P], nodular GE junction mucosa - ESOPHAGEAL SQUAMOUS AND CARDIAC MUCOSA WITH REFLUX-ASSOCIATED CHANGES - NEGATIVE FOR INTESTINAL METAPLASIA OR DYSPLASIA 4. Surgical [P], esophagus - ESOPHAGEAL SQUAMOUS MUCOSA WITH MILD VASCULAR CONGESTION, AND FOCAL SQUAMOUS BALLOONING, SUGGESTIVE OF REFLUX ESOPHAGITIS - NEGATIVE FOR INCREASED INTRAEPITHELIAL EOSINOPHILS  Colonoscopy 09/16/21: - The examined portion of the ileum was normal. - One 3 mm polyp in the cecum, removed with a cold snare. Resected and retrieved. - Two 3 to 5 mm polyps in the sigmoid colon, removed with a cold snare. Resected and retrieved. - Diverticulosis in the sigmoid colon. - Non-bleeding internal hemorrhoids. Path: 5. Surgical [P], colon, cecum, polyp (1) - TUBULAR ADENOMA WITHOUT HIGH-GRADE DYSPLASIA OR MALIGNANCY 6. Surgical [P], colon, sigmoid, polyp (2) - TUBULAR ADENOMA WITHOUT HIGH-GRADE DYSPLASIA OR MALIGNANCY - HYPERPLASTIC POLYP(S)  ASSESSMENT AND PLAN: Dyspepsia GERD Epigastric ab discomfort - improved with PPI therapy Weight loss History of colon polyps Gallstones Patient still has some symptoms of dyspepsia but this is overall improved. She is fairly happy with her regimen at this time. Weight has been stable/slightly increased. Will maintain her on PPI BID therapy for now, and she can use Tums PRN  for breakthrough symptoms, which have helped in the past. The patient lost her low FODMAP diet handout from her last clinic visit so will give her another diet hand out today. - Low FODMAP diet - Continue omeprazole 40 mg BID. Refilled. - Okay to use Tums PRN for breakthrough symptoms - RTC 6 months  Christia Reading, MD  I spent 38 minutes of time, including in depth chart review, independent review of results as outlined above, communicating results with the patient directly, face-to-face time with the patient, coordinating care, ordering studies and medications as appropriate, and documentation.

## 2022-01-28 ENCOUNTER — Inpatient Hospital Stay: Payer: Medicare HMO | Attending: Hematology

## 2022-01-28 DIAGNOSIS — F17210 Nicotine dependence, cigarettes, uncomplicated: Secondary | ICD-10-CM | POA: Diagnosis not present

## 2022-01-28 DIAGNOSIS — M810 Age-related osteoporosis without current pathological fracture: Secondary | ICD-10-CM | POA: Diagnosis not present

## 2022-01-28 DIAGNOSIS — D472 Monoclonal gammopathy: Secondary | ICD-10-CM | POA: Diagnosis not present

## 2022-01-28 DIAGNOSIS — R69 Illness, unspecified: Secondary | ICD-10-CM | POA: Diagnosis not present

## 2022-01-28 LAB — CBC WITH DIFFERENTIAL/PLATELET
Abs Immature Granulocytes: 0.01 10*3/uL (ref 0.00–0.07)
Basophils Absolute: 0.1 10*3/uL (ref 0.0–0.1)
Basophils Relative: 1 %
Eosinophils Absolute: 0.4 10*3/uL (ref 0.0–0.5)
Eosinophils Relative: 5 %
HCT: 46.2 % — ABNORMAL HIGH (ref 36.0–46.0)
Hemoglobin: 15 g/dL (ref 12.0–15.0)
Immature Granulocytes: 0 %
Lymphocytes Relative: 37 %
Lymphs Abs: 2.4 10*3/uL (ref 0.7–4.0)
MCH: 30.1 pg (ref 26.0–34.0)
MCHC: 32.5 g/dL (ref 30.0–36.0)
MCV: 92.6 fL (ref 80.0–100.0)
Monocytes Absolute: 0.5 10*3/uL (ref 0.1–1.0)
Monocytes Relative: 7 %
Neutro Abs: 3.2 10*3/uL (ref 1.7–7.7)
Neutrophils Relative %: 50 %
Platelets: 333 10*3/uL (ref 150–400)
RBC: 4.99 MIL/uL (ref 3.87–5.11)
RDW: 12.8 % (ref 11.5–15.5)
WBC: 6.5 10*3/uL (ref 4.0–10.5)
nRBC: 0 % (ref 0.0–0.2)

## 2022-01-28 LAB — COMPREHENSIVE METABOLIC PANEL
ALT: 23 U/L (ref 0–44)
AST: 25 U/L (ref 15–41)
Albumin: 4.5 g/dL (ref 3.5–5.0)
Alkaline Phosphatase: 63 U/L (ref 38–126)
Anion gap: 8 (ref 5–15)
BUN: 15 mg/dL (ref 8–23)
CO2: 26 mmol/L (ref 22–32)
Calcium: 9.1 mg/dL (ref 8.9–10.3)
Chloride: 97 mmol/L — ABNORMAL LOW (ref 98–111)
Creatinine, Ser: 0.88 mg/dL (ref 0.44–1.00)
GFR, Estimated: >60 mL/min (ref >60)
Glucose, Bld: 87 mg/dL (ref 70–99)
Potassium: 4.1 mmol/L (ref 3.5–5.1)
Sodium: 131 mmol/L — ABNORMAL LOW (ref 135–145)
Total Bilirubin: 0.6 mg/dL (ref 0.3–1.2)
Total Protein: 8.9 g/dL — ABNORMAL HIGH (ref 6.5–8.1)

## 2022-01-28 LAB — LACTATE DEHYDROGENASE: LDH: 105 U/L (ref 98–192)

## 2022-01-29 LAB — KAPPA/LAMBDA LIGHT CHAINS
Kappa free light chain: 294.4 mg/L — ABNORMAL HIGH (ref 3.3–19.4)
Kappa, lambda light chain ratio: 41.46 — ABNORMAL HIGH (ref 0.26–1.65)
Lambda free light chains: 7.1 mg/L (ref 5.7–26.3)

## 2022-02-01 LAB — PROTEIN ELECTROPHORESIS, SERUM
A/G Ratio: 1.1 (ref 0.7–1.7)
Albumin ELP: 4.3 g/dL (ref 2.9–4.4)
Alpha-1-Globulin: 0.3 g/dL (ref 0.0–0.4)
Alpha-2-Globulin: 0.8 g/dL (ref 0.4–1.0)
Beta Globulin: 0.9 g/dL (ref 0.7–1.3)
Gamma Globulin: 2 g/dL — ABNORMAL HIGH (ref 0.4–1.8)
Globulin, Total: 3.9 g/dL (ref 2.2–3.9)
M-Spike, %: 1.8 g/dL — ABNORMAL HIGH
Total Protein ELP: 8.2 g/dL (ref 6.0–8.5)

## 2022-02-04 ENCOUNTER — Other Ambulatory Visit (HOSPITAL_COMMUNITY): Payer: Self-pay | Admitting: Internal Medicine

## 2022-02-04 ENCOUNTER — Inpatient Hospital Stay: Payer: Medicare HMO | Admitting: Hematology

## 2022-02-04 ENCOUNTER — Other Ambulatory Visit: Payer: Self-pay | Admitting: Physician Assistant

## 2022-02-04 ENCOUNTER — Encounter (HOSPITAL_COMMUNITY): Payer: Self-pay | Admitting: Hematology

## 2022-02-04 DIAGNOSIS — Z1231 Encounter for screening mammogram for malignant neoplasm of breast: Secondary | ICD-10-CM

## 2022-02-04 DIAGNOSIS — Z122 Encounter for screening for malignant neoplasm of respiratory organs: Secondary | ICD-10-CM

## 2022-02-04 DIAGNOSIS — D472 Monoclonal gammopathy: Secondary | ICD-10-CM

## 2022-02-04 DIAGNOSIS — M810 Age-related osteoporosis without current pathological fracture: Secondary | ICD-10-CM | POA: Diagnosis not present

## 2022-02-04 DIAGNOSIS — Z87891 Personal history of nicotine dependence: Secondary | ICD-10-CM

## 2022-02-04 DIAGNOSIS — R69 Illness, unspecified: Secondary | ICD-10-CM | POA: Diagnosis not present

## 2022-02-04 MED ORDER — ALENDRONATE SODIUM 70 MG PO TABS: 70.0000 mg | ORAL_TABLET | ORAL | 3 refills | Status: DC

## 2022-02-04 NOTE — Patient Instructions (Addendum)
[facility]  Discharge Instructions  You were seen and examined today by Dr. Delton Coombes.  Dr. Delton Coombes discussed your most recent lab work which revealed that everything looks stable.  Dr. Delton Coombes is ordering as low dose lung CT scan to be done in March 2024. [name], PA-C will send the Fosamax in for you.  Follow-up as scheduled in 4 months.    Thank you for choosing [facility] to provide your oncology and hematology care.   To afford each patient quality time with our provider, please arrive at least 15 minutes before your scheduled appointment time. You may need to reschedule your appointment if you arrive late (10 or more minutes). Arriving late affects you and other patients whose appointments are after yours.  Also, if you miss three or more appointments without notifying the office, you may be dismissed from the clinic at the provider's discretion.    Again, thank you for choosing [facility]  Our hope is that these requests will decrease the amount of time that you wait before being seen by our physicians.   If you have a lab appointment with the [facility] please come in thru the Main Entrance and check in at the main information desk.           _____________________________________________________________  Should you have questions after your visit to [facility], please contact our office at [phone] and follow the prompts.  Our office hours are 8:00 a.m. to 4:30 p.m. Monday - Thursday and 8:00 a.m. to 2:30 p.m. Friday.  Please note that voicemails left after 4:00 p.m. may not be returned until the following business day.  We are closed weekends and all major holidays.  You do have access to a nurse 24-7, just call the main number to the clinic [phone] and do not press any options, hold on the line and a nurse will answer the phone.    For prescription refill requests,  have your pharmacy contact our office and allow 72 hours.    Masks are optional in the cancer centers. If you would like for your care team to wear a mask while they are taking care of you, please let them know. You may have one support person who is at least 67 years old accompany you for your appointments.

## 2022-02-04 NOTE — Progress Notes (Signed)
Misty Richardson,  [id number]   CLINIC:  Medical Oncology/Hematology  PCP:  Misty Richardson, [place] / [place] [address]  [phone]  REASON FOR VISIT:  Follow-up for MGUS  PRIOR THERAPY: none  CURRENT THERAPY: observation  INTERVAL HISTORY:  [name], a 67 y.o. female, seen for follow-up of MGUS.  Denies any new onset bone pains.  Denies any recent infections or hospitalizations.  Energy levels are 50%.  Chronic back and knee pain is stable.  REVIEW OF SYSTEMS:  Review of Systems  Constitutional:  Negative for appetite change.  Musculoskeletal:  Positive for arthralgias (Back and knee).  All other systems reviewed and are negative.   PAST MEDICAL/SURGICAL HISTORY:  Past Medical History:  Diagnosis Date   Anxiety    COPD (chronic obstructive pulmonary disease) (HCC)    Gallstones    GERD (gastroesophageal reflux disease)    MGUS (monoclonal gammopathy of unknown significance)    Osteoporosis    Past Surgical History:  Procedure Laterality Date   ABDOMINAL HYSTERECTOMY     APPENDECTOMY     BONE BIOPSY Left 03/25/2014   BONE MARROW ASPIRATION Left 03/25/2014   BONE MARROW BIOPSY Left 03/19/2014   CESAREAN SECTION     x2    SOCIAL HISTORY:  Social History   Socioeconomic History   Marital status: Divorced    Spouse name: Not on file   Number of children: 2   Years of education: Not on file   Highest education level: Not on file  Occupational History   Occupation: Disabled  Tobacco Use   Smoking status: Every Day    Packs/day: 0.50    Types: Cigarettes    Last attempt to quit: 07/11/2017    Years since quitting: 4.5   Smokeless tobacco: Former    Quit date: 07/31/2012  Vaping Use   Vaping Use: Never used  Substance and Sexual Activity   Alcohol use: Yes    Comment: occasionally   Drug use: No   Sexual activity: Not Currently  Other Topics Concern   Not on file  Social History  Narrative   Not on file   Social Determinants of Health   Financial Resource Strain: Not on file  Food Insecurity: Not on file  Transportation Needs: Not on file  Physical Activity: Not on file  Stress: Not on file  Social Connections: Not on file  Intimate Partner Violence: Not on file    FAMILY HISTORY:  Family History  Problem Relation Age of Onset   Arthritis Mother    Heart failure Father    Colon cancer Maternal Grandmother    Esophageal cancer Paternal Grandfather    Stomach cancer Neg Hx    Colon polyps Neg Hx     CURRENT MEDICATIONS:  Current Outpatient Medications  Medication Sig Dispense Refill   acetaminophen (TYLENOL) 500 MG tablet Take 500 mg by mouth every 6 (six) hours as needed.     albuterol (PROVENTIL HFA;VENTOLIN HFA) 108 (90 BASE) MCG/ACT inhaler Inhale 2 puffs into the lungs every 6 (six) hours as needed for wheezing or shortness of breath. Reported on 02/06/2015     albuterol (PROVENTIL) (2.5 MG/3ML) 0.083% nebulizer solution Take 2.5 mg by nebulization every 6 (six) hours as needed for wheezing or shortness of breath.     ALPRAZolam (XANAX) 1 MG tablet Take 1 mg by mouth at bedtime as needed.     aspirin EC 81 MG tablet  Take 1 tablet (81 mg total) by mouth daily. Swallow whole. 90 tablet 3   budesonide-formoterol (SYMBICORT) 160-4.5 MCG/ACT inhaler Inhale 2 puffs into the lungs 2 (two) times daily. 1 Inhaler 1   cholecalciferol (VITAMIN D3) 25 MCG (1000 UNIT) tablet Take 2,000 Units by mouth daily.     ipratropium-albuterol (DUONEB) 0.5-2.5 (3) MG/3ML SOLN Take 3 mLs by nebulization every 4 (four) hours as needed. 360 mL 0   omeprazole (PRILOSEC) 40 MG capsule Take 1 capsule (40 mg total) by mouth 2 (two) times daily. 60 capsule 2   predniSONE (DELTASONE) 20 MG tablet Take 2 tablets (40 mg total) by mouth daily with breakfast. (Patient taking differently: Take 40 mg by mouth as needed.) 10 tablet 0   rosuvastatin (CRESTOR) 5 MG tablet TAKE ONE TABLET (5 MG  TOTAL) BY MOUTH DAILY. 100 tablet 2   alendronate (FOSAMAX) 70 MG tablet TAKE ONE TABLET ('70MG'$  TOTAL) BY MOUTH ONCE A WEEK (Patient not taking: Reported on 02/04/2022) 12 tablet 1   No current facility-administered medications for this visit.    ALLERGIES:  Allergies  Allergen Reactions   Avelox [Moxifloxacin Hcl In Nacl] Rash    PHYSICAL EXAM:  Performance status (ECOG): 1 - Symptomatic but completely ambulatory  There were no vitals filed for this visit. Wt Readings from Last 3 Encounters:  01/05/22 97 lb 2 oz (44.1 kg)  11/03/21 93 lb 12.8 oz (42.5 kg)  10/29/21 93 lb 4.1 oz (42.3 kg)   Physical Exam Vitals reviewed.  Constitutional:      Appearance: Normal appearance.  Cardiovascular:     Rate and Rhythm: Normal rate and regular rhythm.     Pulses: Normal pulses.     Heart sounds: Normal heart sounds.  Pulmonary:     Effort: Pulmonary effort is normal.     Breath sounds: Normal breath sounds.  Abdominal:     Palpations: Abdomen is soft. There is no hepatomegaly, splenomegaly or mass.     Tenderness: There is no abdominal tenderness.  Musculoskeletal:     Right lower leg: No edema.     Left lower leg: No edema.  Lymphadenopathy:     Cervical:     Right cervical: No superficial cervical adenopathy.    Upper Body:     Right upper body: No supraclavicular, axillary or pectoral adenopathy.     Left upper body: No supraclavicular, axillary or pectoral adenopathy.  Neurological:     General: No focal deficit present.     Mental Status: She is alert and oriented to person, place, and time.  Psychiatric:        Mood and Affect: Mood normal.        Behavior: Behavior normal.     LABORATORY DATA:  I have reviewed the labs as listed.     Latest Ref Rng & Units 01/28/2022    2:13 PM 10/22/2021   12:55 PM 08/17/2021    1:38 PM  CBC  WBC 4.0 - 10.5 K/uL 6.5  6.6  8.5   Hemoglobin 12.0 - 15.0 g/dL 15.0  13.7  13.3   Hematocrit 36.0 - 46.0 % 46.2  41.5  39.4   Platelets  150 - 400 K/uL 333  332  276       Latest Ref Rng & Units 01/28/2022    2:13 PM 10/22/2021   12:55 PM 08/17/2021    1:38 PM  CMP  Glucose 70 - 99 mg/dL 87  100  97   BUN 8 -  23 mg/dL '15  17  24   '$ Creatinine 0.44 - 1.00 mg/dL 0.88  0.70  0.66   Sodium 135 - 145 mmol/L 131  137  141   Potassium 3.5 - 5.1 mmol/L 4.1  3.5  3.4   Chloride 98 - 111 mmol/L 97  107  111   CO2 22 - 32 mmol/L '26  24  25   '$ Calcium 8.9 - 10.3 mg/dL 9.1  9.1  9.5   Total Protein 6.5 - 8.1 g/dL 8.9  8.3  7.5   Total Bilirubin 0.3 - 1.2 mg/dL 0.6  0.5  0.4   Alkaline Phos 38 - 126 U/L 63  64  50   AST 15 - 41 U/L '25  23  25   '$ ALT 0 - 44 U/L '23  23  26       '$ Component Value Date/Time   RBC 4.99 01/28/2022 1413   MCV 92.6 01/28/2022 1413   MCV 91 07/04/2021 1529   MCH 30.1 01/28/2022 1413   MCHC 32.5 01/28/2022 1413   RDW 12.8 01/28/2022 1413   RDW 12.8 07/04/2021 1529   LYMPHSABS 2.4 01/28/2022 1413   LYMPHSABS 2.7 07/04/2021 1529   MONOABS 0.5 01/28/2022 1413   EOSABS 0.4 01/28/2022 1413   EOSABS 0.4 07/04/2021 1529   BASOSABS 0.1 01/28/2022 1413   BASOSABS 0.1 07/04/2021 1529    DIAGNOSTIC IMAGING:  I have independently reviewed the scans and discussed with the patient. No results found.   ASSESSMENT:  1.  IgG kappa MGUS: - Bone marrow biopsy in March 2016 shows 9% plasma cells with kappa restriction. -Skeletal survey from 07/11/2017 shows stable T7 compression deformity with no new lytic lesions. - Labs from 12/25/2019 returned showing an M spike of 1.8.  Creatinine 0.64, calcium 9.1, and hemoglobin of 14.1.  - Free light chain ratio has increased to 53.76 from 48.03.  Kappa light chains were 338.7. Lambda light chain was 6.3. -A skeletal survey from 12/25/2019 shows no evidence of multiple myeloma.   2.  Smoking history: -She smoked 1 pack/day for the past 42 years. -CT lung on 11/24/2019 returned showing a Lung-RADS 2S   3.  Osteoporosis: -Bone density test on 08/13/2019 shows T score  -3.4. -She is taking Fosamax weekly and also continue to take calcium and vitamin D.   4.  Health maintenance: -Mammogram on 11/28/2018 was BI-RADS Category 1.   PLAN:  1.  IgG kappa MGUS: - I reviewed labs from 1 11/19/2022.  Creatinine and calcium are normal.  M spike is 1.8 g, slightly up from 1.6 g.  Hemoglobin is 15.  Kappa light chains at 294 and ratio is 41. - Last skeletal survey was on 10/22/2021: Diffuse osteopenia with no lytic lesions.  Chronic fracture of T7. - No "crab" features.  RTC 4 months with repeat myeloma labs.   2.  Smoking history: - We will schedule her for CT low-dose scan end of March.   3.  Osteoporosis: - Continue Fosamax weekly.  Last vitamin D level was 58.     Orders placed this encounter:  No orders of the defined types were placed in this encounter.    Derek Jack, MD [facility] [phone]

## 2022-03-08 ENCOUNTER — Ambulatory Visit (HOSPITAL_COMMUNITY)
Admission: RE | Admit: 2022-03-08 | Discharge: 2022-03-08 | Disposition: A | Discharge status: Home / Self Care | Payer: Medicare HMO | Source: Ambulatory Visit | Attending: Internal Medicine | Admitting: Internal Medicine

## 2022-03-08 DIAGNOSIS — Z1231 Encounter for screening mammogram for malignant neoplasm of breast: Secondary | ICD-10-CM | POA: Insufficient documentation

## 2022-03-19 ENCOUNTER — Telehealth: Payer: Self-pay | Admitting: Internal Medicine

## 2022-03-19 NOTE — Telephone Encounter (Signed)
This needs to go to the CMA I will send to her now

## 2022-03-19 NOTE — Telephone Encounter (Signed)
PT is calling to get a refill for omeprazole. She explained that her pharmacy faxed over a request but she hadnt heard of an update. Please advise.

## 2022-03-22 ENCOUNTER — Other Ambulatory Visit: Payer: Self-pay

## 2022-03-22 ENCOUNTER — Encounter: Payer: Self-pay | Admitting: Internal Medicine

## 2022-03-22 MED ORDER — OMEPRAZOLE 40 MG PO CPDR: 40.0000 mg | DELAYED_RELEASE_CAPSULE | Freq: Two times a day (BID) | ORAL | 2 refills | Status: DC

## 2022-03-26 ENCOUNTER — Encounter (HOSPITAL_COMMUNITY): Payer: Self-pay | Admitting: Hematology

## 2022-03-26 DIAGNOSIS — Z681 Body mass index (BMI) 19 or less, adult: Secondary | ICD-10-CM | POA: Diagnosis not present

## 2022-03-26 DIAGNOSIS — M5412 Radiculopathy, cervical region: Secondary | ICD-10-CM | POA: Diagnosis not present

## 2022-03-26 DIAGNOSIS — M48062 Spinal stenosis, lumbar region with neurogenic claudication: Secondary | ICD-10-CM | POA: Diagnosis not present

## 2022-03-26 DIAGNOSIS — D472 Monoclonal gammopathy: Secondary | ICD-10-CM | POA: Diagnosis not present

## 2022-03-26 DIAGNOSIS — R69 Illness, unspecified: Secondary | ICD-10-CM | POA: Diagnosis not present

## 2022-03-26 DIAGNOSIS — I700 Atherosclerosis of aorta: Secondary | ICD-10-CM | POA: Diagnosis not present

## 2022-03-26 DIAGNOSIS — M810 Age-related osteoporosis without current pathological fracture: Secondary | ICD-10-CM | POA: Diagnosis not present

## 2022-04-09 ENCOUNTER — Ambulatory Visit (HOSPITAL_COMMUNITY)
Admission: RE | Admit: 2022-04-09 | Discharge: 2022-04-09 | Disposition: A | Discharge status: Home / Self Care | Payer: Medicare HMO | Source: Ambulatory Visit | Attending: Hematology | Admitting: Hematology

## 2022-04-09 DIAGNOSIS — Z87891 Personal history of nicotine dependence: Secondary | ICD-10-CM | POA: Diagnosis not present

## 2022-04-09 DIAGNOSIS — Z122 Encounter for screening for malignant neoplasm of respiratory organs: Secondary | ICD-10-CM | POA: Diagnosis not present

## 2022-04-09 DIAGNOSIS — D472 Monoclonal gammopathy: Secondary | ICD-10-CM | POA: Diagnosis not present

## 2022-04-09 DIAGNOSIS — F17210 Nicotine dependence, cigarettes, uncomplicated: Secondary | ICD-10-CM | POA: Diagnosis not present

## 2022-04-09 DIAGNOSIS — R69 Illness, unspecified: Secondary | ICD-10-CM | POA: Diagnosis not present

## 2022-04-14 DIAGNOSIS — M7542 Impingement syndrome of left shoulder: Secondary | ICD-10-CM | POA: Diagnosis not present

## 2022-04-23 NOTE — Progress Notes (Signed)
Patient notified of LDCT Lung Cancer Screening Results via mail with the recommendation to follow-up in 12 months. Patient's referring provider has been sent a copy of results. Results are as follows:   IMPRESSION: 1. Lung-RADS 2, benign appearance or behavior. Continue annual screening with low-dose chest CT without contrast in 12 months. 2.  Aortic atherosclerosis (ICD10-I70.0). 3.  Emphysema (ICD10-J43.9). 

## 2022-04-28 DIAGNOSIS — M25512 Pain in left shoulder: Secondary | ICD-10-CM | POA: Diagnosis not present

## 2022-05-05 DIAGNOSIS — M25512 Pain in left shoulder: Secondary | ICD-10-CM | POA: Diagnosis not present

## 2022-05-19 DIAGNOSIS — M25512 Pain in left shoulder: Secondary | ICD-10-CM | POA: Diagnosis not present

## 2022-05-24 DIAGNOSIS — M25512 Pain in left shoulder: Secondary | ICD-10-CM | POA: Diagnosis not present

## 2022-05-26 DIAGNOSIS — M25512 Pain in left shoulder: Secondary | ICD-10-CM | POA: Diagnosis not present

## 2022-05-27 ENCOUNTER — Inpatient Hospital Stay: Payer: Medicare HMO | Attending: Hematology

## 2022-05-27 DIAGNOSIS — F17210 Nicotine dependence, cigarettes, uncomplicated: Secondary | ICD-10-CM | POA: Insufficient documentation

## 2022-05-27 DIAGNOSIS — D472 Monoclonal gammopathy: Secondary | ICD-10-CM | POA: Diagnosis not present

## 2022-05-27 LAB — CBC WITH DIFFERENTIAL/PLATELET
Abs Immature Granulocytes: 0.02 10*3/uL (ref 0.00–0.07)
Basophils Absolute: 0.1 10*3/uL (ref 0.0–0.1)
Basophils Relative: 1 %
Eosinophils Absolute: 0.1 10*3/uL (ref 0.0–0.5)
Eosinophils Relative: 2 %
HCT: 41.6 % (ref 36.0–46.0)
Hemoglobin: 13.8 g/dL (ref 12.0–15.0)
Immature Granulocytes: 0 %
Lymphocytes Relative: 29 %
Lymphs Abs: 2 10*3/uL (ref 0.7–4.0)
MCH: 30.5 pg (ref 26.0–34.0)
MCHC: 33.2 g/dL (ref 30.0–36.0)
MCV: 92 fL (ref 80.0–100.0)
Monocytes Absolute: 0.4 10*3/uL (ref 0.1–1.0)
Monocytes Relative: 6 %
Neutro Abs: 4.1 10*3/uL (ref 1.7–7.7)
Neutrophils Relative %: 62 %
Platelets: 355 10*3/uL (ref 150–400)
RBC: 4.52 MIL/uL (ref 3.87–5.11)
RDW: 14.5 % (ref 11.5–15.5)
WBC: 6.7 10*3/uL (ref 4.0–10.5)
nRBC: 0 % (ref 0.0–0.2)

## 2022-05-27 LAB — COMPREHENSIVE METABOLIC PANEL
ALT: 22 U/L (ref 0–44)
AST: 26 U/L (ref 15–41)
Albumin: 4.1 g/dL (ref 3.5–5.0)
Alkaline Phosphatase: 61 U/L (ref 38–126)
Anion gap: 8 (ref 5–15)
BUN: 15 mg/dL (ref 8–23)
CO2: 22 mmol/L (ref 22–32)
Calcium: 8.7 mg/dL — ABNORMAL LOW (ref 8.9–10.3)
Chloride: 102 mmol/L (ref 98–111)
Creatinine, Ser: 0.73 mg/dL (ref 0.44–1.00)
GFR, Estimated: >60 mL/min (ref >60)
Glucose, Bld: 92 mg/dL (ref 70–99)
Potassium: 3.7 mmol/L (ref 3.5–5.1)
Sodium: 132 mmol/L — ABNORMAL LOW (ref 135–145)
Total Bilirubin: 0.6 mg/dL (ref 0.3–1.2)
Total Protein: 8.1 g/dL (ref 6.5–8.1)

## 2022-05-27 LAB — LACTATE DEHYDROGENASE: LDH: 96 U/L — ABNORMAL LOW (ref 98–192)

## 2022-05-28 LAB — KAPPA/LAMBDA LIGHT CHAINS
Kappa free light chain: 233.8 mg/L — ABNORMAL HIGH (ref 3.3–19.4)
Kappa, lambda light chain ratio: 38.33 — ABNORMAL HIGH (ref 0.26–1.65)
Lambda free light chains: 6.1 mg/L (ref 5.7–26.3)

## 2022-06-02 LAB — PROTEIN ELECTROPHORESIS, SERUM
A/G Ratio: 1 (ref 0.7–1.7)
Albumin ELP: 4 g/dL (ref 2.9–4.4)
Alpha-1-Globulin: 0.3 g/dL (ref 0.0–0.4)
Alpha-2-Globulin: 0.8 g/dL (ref 0.4–1.0)
Beta Globulin: 0.9 g/dL (ref 0.7–1.3)
Gamma Globulin: 1.9 g/dL — ABNORMAL HIGH (ref 0.4–1.8)
Globulin, Total: 3.9 g/dL (ref 2.2–3.9)
M-Spike, %: 1.6 g/dL — ABNORMAL HIGH
Total Protein ELP: 7.9 g/dL (ref 6.0–8.5)

## 2022-06-02 NOTE — Progress Notes (Signed)
[facility] [address], [place] [address]   CLINIC:  Medical Oncology/Hematology  PCP:  Elfredia Nevins, MD [address] [place] [address] [phone]   REASON FOR VISIT:  Follow-up for MGUS   PRIOR THERAPY: None   CURRENT THERAPY: Observation  INTERVAL HISTORY:   [name] 67 y.o. female returns for routine follow-up of MGUS.  She was last seen by Dr. Ellin Saba on 02/04/2022.  She denies any new bone pain or recent fractures, but has some chronic achy in her left shoulder.  She does have chronic intermittent left-sided ankle pain due to remote history of left ankle fracture, as well as intermittent back pain related to chronic T7 compression fracture.  She denies any B symptoms such as fever, chills, or night sweats.  She has been losing weight, and has lost about 5 pounds in the past 4 months - she admits that she does not always eat as much as she should, and reports that her "breakfast" is often just coffee and cigarette.   No new neurologic symptoms such as tinnitus, new-onset hearing loss, blurred vision, headache, or dizziness. Denies any numbness or tingling in hands or feet.  No new masses or lymphadenopathy per her report.   She denies any unusual fatigue.  She has 80% energy and 75% appetite. She endorses that she is maintaining a stable weight.  ASSESSMENT & PLAN:  1.  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 (Previous biopsy in August 2010 showed 3% plasma cells) - Skeletal survey from 07/11/2017 shows stable T7 compression deformity with no new lytic lesions - Skeletal survey (12/22/2020): Diffuse osteopenia with multiple small lucencies noted over the skull/questionable faint lucency noted over the proximal right humerus and questionable small lucency noted over the right posterior eighth rib; myeloma or metastatic disease cannot be excluded, per  radiology report. - Repeat skeletal survey (10/22/2021): No lytic or blastic lesions identified (previously identified lesions are not definitively seen on this study).  Diffuse osteopenia.  Chronic fracture of T7. - Myeloma panel (05/27/2022) is essentially stable. M spike 1.6 Elevated kappa light chains 233.8, normal lambda light chain 6.1, elevated ratio 38.33. Normal LDH No CRAB features (creatinine 0.73, calcium 8.7, Hgb 13.8) - She does not report any new onset bone pains, B symptoms, or recurrent infections      - PLAN: 24-hour urine with UPEP/UIFE to obtain baseline, since this has never been done -- We will continue to watch closely with repeat MGUS/myeloma panel in 4 months   - Will consider bone marrow biopsy and/or PET if significant progression or development of CRAB signs/symptoms     2.  Smoking history: - She smoked 1 pack/day for the past 42 years - currently smoking 0.75 PPD - CT lung on 11/24/2019 returned showing a Lung-RADS 2S - LDCT chest (04/12/2022): Lung RADS 2, benign appearance or behavior - PLAN: Continue annual LDCT chest, next due March 2025.     3.  Osteoporosis: - Bone density test on 08/13/2019 shows T score -3.4. - Most recent bone density/DEXA scan (08/17/2021): Osteoporosis with T-score -3.5 - She is taking Fosamax weekly since February 2016 and also continue to take calcium and vitamin D. - Last vitamin D normal at 58.79 (11/03/2021) - PLAN: Continue Fosamax, calcium, and vitamin D.  We will repeat bone density scan in July 2026. - She will have completed 10 years of treatment with Fosamax  in February 2026    4.  Health maintenance: - Mammogram on 03/08/2022 was BI-RADS Category 1, negative - PLAN: Continue screening mammograms via PCP  PLAN SUMMARY: >> 24-hour urine w/ UPEP/UIFE >> Labs in 4 months = CBC/D, CMP, SPEP, light chains, LDH >> OFFICE visit in 4 months (1 week after labs)      REVIEW OF SYSTEMS:   Review of Systems  Constitutional:   Positive for fatigue. Negative for appetite change, chills, diaphoresis, fever and unexpected weight change.  HENT:   Negative for lump/mass and nosebleeds.   Eyes:  Negative for eye problems.  Respiratory:  Positive for cough and shortness of breath (COPD). Negative for hemoptysis.   Cardiovascular:  Negative for chest pain, leg swelling and palpitations.  Gastrointestinal:  Negative for abdominal pain, blood in stool, constipation, diarrhea, nausea and vomiting.  Genitourinary:  Negative for hematuria.   Musculoskeletal:  Positive for arthralgias (Left shoulder) and back pain.  Skin: Negative.   Neurological:  Negative for dizziness, headaches and light-headedness.  Hematological:  Does not bruise/bleed easily.  Psychiatric/Behavioral:  Positive for sleep disturbance. The patient is nervous/anxious.      PHYSICAL EXAM:  ECOG PERFORMANCE STATUS: 1 - Symptomatic but completely ambulatory  There were no vitals filed for this visit. There were no vitals filed for this visit. Physical Exam Constitutional:      Appearance: Normal appearance.  HENT:     Head: Normocephalic and atraumatic.     Mouth/Throat:     Mouth: Mucous membranes are moist.  Eyes:     Extraocular Movements: Extraocular movements intact.     Pupils: Pupils are equal, round, and reactive to light.  Cardiovascular:     Rate and Rhythm: Normal rate and regular rhythm.     Pulses: Normal pulses.     Heart sounds: Normal heart sounds.  Pulmonary:     Effort: Pulmonary effort is normal.     Comments: Decreased breath sounds in all lung fields Abdominal:     General: Bowel sounds are normal.     Palpations: Abdomen is soft.     Tenderness: There is no abdominal tenderness.  Musculoskeletal:        General: No swelling.     Right lower leg: No edema.     Left lower leg: No edema.  Lymphadenopathy:     Cervical: No cervical adenopathy.  Skin:    General: Skin is warm and dry.  Neurological:     General: No focal  deficit present.     Mental Status: She is alert and oriented to person, place, and time.  Psychiatric:        Mood and Affect: Mood normal.        Behavior: Behavior normal.     PAST MEDICAL/SURGICAL HISTORY:  Past Medical History:  Diagnosis Date   Anxiety    COPD (chronic obstructive pulmonary disease) (HCC)    Gallstones    GERD (gastroesophageal reflux disease)    MGUS (monoclonal gammopathy of unknown significance)    Osteoporosis    Past Surgical History:  Procedure Laterality Date   ABDOMINAL HYSTERECTOMY     APPENDECTOMY     BONE BIOPSY Left 03/25/2014   BONE MARROW ASPIRATION Left 03/25/2014   BONE MARROW BIOPSY Left 03/19/2014   CESAREAN SECTION     x2    SOCIAL HISTORY:  Social History   Socioeconomic History   Marital status: Divorced    Spouse name: Not on file   Number of  children: 2   Years of education: Not on file   Highest education level: Not on file  Occupational History   Occupation: Disabled  Tobacco Use   Smoking status: Every Day    Packs/day: .5    Types: Cigarettes    Last attempt to quit: 07/11/2017    Years since quitting: 4.8   Smokeless tobacco: Former    Quit date: 07/31/2012  Vaping Use   Vaping Use: Never used  Substance and Sexual Activity   Alcohol use: Yes    Comment: occasionally   Drug use: No   Sexual activity: Not Currently  Other Topics Concern   Not on file  Social History Narrative   Not on file   Social Determinants of Health   Financial Resource Strain: Not on file  Food Insecurity: Not on file  Transportation Needs: Not on file  Physical Activity: Not on file  Stress: Not on file  Social Connections: Not on file  Intimate Partner Violence: Not on file    FAMILY HISTORY:  Family History  Problem Relation Age of Onset   Arthritis Mother    Heart failure Father    Colon cancer Maternal Grandmother    Esophageal cancer Paternal Grandfather    Stomach cancer Neg Hx    Colon polyps Neg Hx      CURRENT MEDICATIONS:  Outpatient Encounter Medications as of 06/03/2022  Medication Sig   acetaminophen (TYLENOL) 500 MG tablet Take 500 mg by mouth every 6 (six) hours as needed.   albuterol (PROVENTIL HFA;VENTOLIN HFA) 108 (90 BASE) MCG/ACT inhaler Inhale 2 puffs into the lungs every 6 (six) hours as needed for wheezing or shortness of breath. Reported on 02/06/2015   albuterol (PROVENTIL) (2.5 MG/3ML) 0.083% nebulizer solution Take 2.5 mg by nebulization every 6 (six) hours as needed for wheezing or shortness of breath.   alendronate (FOSAMAX) 70 MG tablet Take 1 tablet (70 mg total) by mouth once a week. Take with a full glass of water on an empty stomach.   ALPRAZolam (XANAX) 1 MG tablet Take 1 mg by mouth at bedtime as needed.   aspirin EC 81 MG tablet Take 1 tablet (81 mg total) by mouth daily. Swallow whole.   budesonide-formoterol (SYMBICORT) 160-4.5 MCG/ACT inhaler Inhale 2 puffs into the lungs 2 (two) times daily.   cholecalciferol (VITAMIN D3) 25 MCG (1000 UNIT) tablet Take 2,000 Units by mouth daily.   ipratropium-albuterol (DUONEB) 0.5-2.5 (3) MG/3ML SOLN Take 3 mLs by nebulization every 4 (four) hours as needed.   omeprazole (PRILOSEC) 40 MG capsule Take 1 capsule (40 mg total) by mouth 2 (two) times daily.   predniSONE (DELTASONE) 20 MG tablet Take 2 tablets (40 mg total) by mouth daily with breakfast. (Patient taking differently: Take 40 mg by mouth as needed.)   rosuvastatin (CRESTOR) 5 MG tablet TAKE ONE TABLET (5 MG TOTAL) BY MOUTH DAILY.   No facility-administered encounter medications on file as of 06/03/2022.    ALLERGIES:  Allergies  Allergen Reactions   Avelox [Moxifloxacin Hcl In Nacl] Rash    LABORATORY DATA:  I have reviewed the labs as listed.  CBC    Component Value Date/Time   WBC 6.7 05/27/2022 1319   RBC 4.52 05/27/2022 1319   HGB 13.8 05/27/2022 1319   HGB 14.7 07/04/2021 1529   HCT 41.6 05/27/2022 1319   HCT 43.1 07/04/2021 1529   PLT 355  05/27/2022 1319   PLT 307 07/04/2021 1529   MCV 92.0 05/27/2022 1319  MCV 91 07/04/2021 1529   MCH 30.5 05/27/2022 1319   MCHC 33.2 05/27/2022 1319   RDW 14.5 05/27/2022 1319   RDW 12.8 07/04/2021 1529   LYMPHSABS 2.0 05/27/2022 1319   LYMPHSABS 2.7 07/04/2021 1529   MONOABS 0.4 05/27/2022 1319   EOSABS 0.1 05/27/2022 1319   EOSABS 0.4 07/04/2021 1529   BASOSABS 0.1 05/27/2022 1319   BASOSABS 0.1 07/04/2021 1529      Latest Ref Rng & Units 05/27/2022    1:19 PM 01/28/2022    2:13 PM 10/22/2021   12:55 PM  CMP  Glucose 70 - 99 mg/dL 92  87  811   BUN 8 - 23 mg/dL 15  15  17    Creatinine 0.44 - 1.00 mg/dL 9.14  7.82  9.56   Sodium 135 - 145 mmol/L 132  131  137   Potassium 3.5 - 5.1 mmol/L 3.7  4.1  3.5   Chloride 98 - 111 mmol/L 102  97  107   CO2 22 - 32 mmol/L 22  26  24    Calcium 8.9 - 10.3 mg/dL 8.7  9.1  9.1   Total Protein 6.5 - 8.1 g/dL 8.1  8.9  8.3   Total Bilirubin 0.3 - 1.2 mg/dL 0.6  0.6  0.5   Alkaline Phos 38 - 126 U/L 61  63  64   AST 15 - 41 U/L 26  25  23    ALT 0 - 44 U/L 22  23  23      DIAGNOSTIC IMAGING:  I have independently reviewed the relevant imaging and discussed with the patient.   WRAP UP:  All questions were answered. The patient knows to call the clinic with any problems, questions or concerns.  Medical decision making: Moderate  Time spent on visit: I spent 20 minutes counseling the patient face to face. The total time spent in the appointment was 30 minutes and more than 50% was on counseling.  [name], PA-C  06/03/22 2:19 PM

## 2022-06-03 ENCOUNTER — Inpatient Hospital Stay: Payer: Medicare HMO | Admitting: Physician Assistant

## 2022-06-03 ENCOUNTER — Ambulatory Visit: Payer: Medicare HMO | Admitting: Physician Assistant

## 2022-06-03 ENCOUNTER — Other Ambulatory Visit: Payer: Self-pay

## 2022-06-03 VITALS — BP 102/64 | HR 96 | Temp 98.0°F | Resp 18 | Ht 60.0 in | Wt 92.8 lb

## 2022-06-03 DIAGNOSIS — D472 Monoclonal gammopathy: Secondary | ICD-10-CM

## 2022-06-03 DIAGNOSIS — F17210 Nicotine dependence, cigarettes, uncomplicated: Secondary | ICD-10-CM | POA: Diagnosis not present

## 2022-06-03 NOTE — Patient Instructions (Addendum)
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   MGUS ("monoclonal gammopathy of undetermined significance") versus "Smoldering Myeloma" As we discussed, this is a "precancerous" condition where you have slightly increased plasma cells making a slightly increased amount of abnormal immunoglobulin proteins. Although MGUS is not causing any current problems in your body, it has a 1% each year risk of progression to multiple myeloma cancer. At this time, your blood work does not show any sign of having progressed to multiple myeloma cancer.  Your blood work is stable.  However, you most likely fall in the "gray area" between MGUS and multiple myeloma, which is referred to as "smoldering myeloma." We will continue to monitor your labs every 4 months and check whole-body x-rays once a year (next due October 2024) Please see the attached handout for further information regarding MGUS.   We will also check a 24-hour urine study.  Kit has been provided for you. FIRST MORNING: Discard first urine of the morning into the toilet. Collect the rest of your urine in the orange jug for the next 24 hours. SECOND MORNING: Collect your first urine of the morning in the jug.  This ends your 24-hour urine collection. **Store the urine jug in the refrigerator when it is not being used. Return urine jug to fourth floor front desk as soon as it is completed.  Continue to take Fosamax once a week for your osteoporosis.  FOLLOW-UP APPOINTMENT: Office visit in 4 months, after labs   ** Thank you for trusting me with your healthcare!  I strive to provide all of my patients with quality care at each visit.  If you receive a survey for this visit, I would be so grateful to you for taking the time to provide feedback.  Thank you in advance!  ~ Maayan Jenning                   Dr.  Doreatha Massed   &   Rojelio Brenner, PA-C   - - - - - - - - - - - - - - - - - -   Thank you for choosing [facility] at [facility] to provide your oncology and hematology care.  To afford each patient quality time with our provider, please arrive at least 15 minutes before your scheduled appointment time.   If you have a lab appointment with the Cancer Center please come in thru the Main Entrance and check in at the main information desk.  You need to re-schedule your appointment should you arrive 10 or more minutes late.  We strive to give you quality time with our providers, and arriving late affects you and other patients whose appointments are after yours.  Also, if you no show three or more times for appointments you may be dismissed from the clinic at the providers discretion.     Again, thank you for choosing [facility].  Our hope is that these requests will decrease the amount of time that you wait before being seen by our physicians.       _____________________________________________________________  Should you have questions after your visit to [facility], please contact our office at [phone] and follow the prompts.  Our office hours are 8:00 a.m. and 4:30 p.m. Monday - Friday.  Please note that voicemails left after 4:00 p.m. may  not be returned until the following business day.  We are closed weekends and major holidays.  You do have access to a nurse 24-7, just call the main number to the clinic [phone] and do not press any options, hold on the line and a nurse will answer the phone.    For prescription refill requests, have your pharmacy contact our office and allow 72 hours.    Due to Covid, you will need to wear a mask upon entering the hospital. If you do not have a mask, a mask will be given to you at the Main Entrance upon arrival. For doctor visits, patients may have 1 support person age 62 or older with  them. For treatment visits, patients can not have anyone with them due to social distancing guidelines and our immunocompromised population.

## 2022-06-04 DIAGNOSIS — F419 Anxiety disorder, unspecified: Secondary | ICD-10-CM | POA: Diagnosis not present

## 2022-06-04 DIAGNOSIS — Z681 Body mass index (BMI) 19 or less, adult: Secondary | ICD-10-CM | POA: Diagnosis not present

## 2022-06-04 DIAGNOSIS — E559 Vitamin D deficiency, unspecified: Secondary | ICD-10-CM | POA: Diagnosis not present

## 2022-06-04 DIAGNOSIS — Z0001 Encounter for general adult medical examination with abnormal findings: Secondary | ICD-10-CM | POA: Diagnosis not present

## 2022-06-04 DIAGNOSIS — J449 Chronic obstructive pulmonary disease, unspecified: Secondary | ICD-10-CM | POA: Diagnosis not present

## 2022-06-04 DIAGNOSIS — I700 Atherosclerosis of aorta: Secondary | ICD-10-CM | POA: Diagnosis not present

## 2022-06-04 DIAGNOSIS — Z1331 Encounter for screening for depression: Secondary | ICD-10-CM | POA: Diagnosis not present

## 2022-06-04 DIAGNOSIS — M5412 Radiculopathy, cervical region: Secondary | ICD-10-CM | POA: Diagnosis not present

## 2022-06-04 DIAGNOSIS — M810 Age-related osteoporosis without current pathological fracture: Secondary | ICD-10-CM | POA: Diagnosis not present

## 2022-06-04 DIAGNOSIS — G9332 Myalgic encephalomyelitis/chronic fatigue syndrome: Secondary | ICD-10-CM | POA: Diagnosis not present

## 2022-06-04 DIAGNOSIS — R911 Solitary pulmonary nodule: Secondary | ICD-10-CM | POA: Diagnosis not present

## 2022-06-04 DIAGNOSIS — M48062 Spinal stenosis, lumbar region with neurogenic claudication: Secondary | ICD-10-CM | POA: Diagnosis not present

## 2022-06-04 DIAGNOSIS — D518 Other vitamin B12 deficiency anemias: Secondary | ICD-10-CM | POA: Diagnosis not present

## 2022-06-04 DIAGNOSIS — F5101 Primary insomnia: Secondary | ICD-10-CM | POA: Diagnosis not present

## 2022-06-04 DIAGNOSIS — D472 Monoclonal gammopathy: Secondary | ICD-10-CM | POA: Diagnosis not present

## 2022-06-07 ENCOUNTER — Other Ambulatory Visit (HOSPITAL_COMMUNITY)
Admission: RE | Admit: 2022-06-07 | Discharge: 2022-06-07 | Disposition: A | Discharge status: Home / Self Care | Payer: Medicare HMO | Source: Ambulatory Visit | Attending: Physician Assistant | Admitting: Physician Assistant

## 2022-06-07 DIAGNOSIS — D472 Monoclonal gammopathy: Secondary | ICD-10-CM | POA: Diagnosis not present

## 2022-06-09 LAB — UPEP/UIFE/LIGHT CHAINS/TP, 24-HR UR
% BETA, Urine: 23.6 %
ALPHA 1 URINE: 13.4 %
Albumin, U: 28.7 %
Alpha 2, Urine: 19.3 %
Free Kappa Lt Chains,Ur: 13.6 mg/L (ref 1.17–86.46)
Free Kappa/Lambda Ratio: 8.05 (ref 1.83–14.26)
Free Lambda Lt Chains,Ur: 1.69 mg/L (ref 0.27–15.21)
GAMMA GLOBULIN URINE: 15 %
Total Protein, Urine-Ur/day: 75 mg/24 hr (ref 30–150)
Total Protein, Urine: 8.1 mg/dL
Total Volume: 925

## 2022-07-15 ENCOUNTER — Ambulatory Visit: Payer: Medicare HMO | Admitting: Internal Medicine

## 2022-07-15 VITALS — BP 118/70 | HR 91 | Ht 60.0 in | Wt 92.0 lb

## 2022-07-15 DIAGNOSIS — K219 Gastro-esophageal reflux disease without esophagitis: Secondary | ICD-10-CM | POA: Diagnosis not present

## 2022-07-15 DIAGNOSIS — R140 Abdominal distension (gaseous): Secondary | ICD-10-CM | POA: Diagnosis not present

## 2022-07-15 DIAGNOSIS — R634 Abnormal weight loss: Secondary | ICD-10-CM

## 2022-07-15 DIAGNOSIS — R1013 Epigastric pain: Secondary | ICD-10-CM

## 2022-07-15 MED ORDER — OMEPRAZOLE 40 MG PO CPDR: 40.0000 mg | DELAYED_RELEASE_CAPSULE | Freq: Every day | ORAL | 2 refills | Status: DC

## 2022-07-15 NOTE — Patient Instructions (Addendum)
Start taking Miralax daily  Encourage drinking 8 cups of water daily Encourage drinking ensure as a meal supplement    We have sent the following medications to your pharmacy for you to pick up at your convenience: Omeprazole  You are schedule for a follow up visit on 11/02/22 at 2:10 pm  If your blood pressure at your visit was 140/90 or greater, please contact your primary care physician to follow up on this.  _______________________________________________________  If you are age 85 or older, your body mass index should be between 23-30. Your Body mass index is 17.97 kg/m. If this is out of the aforementioned range listed, please consider follow up with your Primary Care Provider.  If you are age 63 or younger, your body mass index should be between 19-25. Your Body mass index is 17.97 kg/m. If this is out of the aformentioned range listed, please consider follow up with your Primary Care Provider.   ________________________________________________________  The [facility] providers would like to encourage you to use [facility] to communicate with providers for non-urgent requests or questions.  Due to long hold times on the telephone, sending your provider a message by [facility] may be a faster and more efficient way to get a response.  Please allow 48 business hours for a response.  Please remember that this is for non-urgent requests.      Thank you for entrusting me with your care and for choosing [facility], Dr. Eulah Pont

## 2022-07-15 NOTE — Progress Notes (Signed)
Chief Complaint: Bloating, abdominal discomfort  HPI : 67 year old female with history of COPD, gallstones, MGUS, osteoporosis, anxiety presents for follow up of bloating and abdominal discomfort  Interval History: She has GERD at times. She is still on omeprazole 40 mg BID. In the mornings, she feels good. Then the second half of the day, then she starts to get bloated. This bloating can occur before she eats or takes the second dose of her PPI. She has one BM every other day. The bloating does not make her feel good. She is still uncomfortable wearing her bra due to tightness. She has cut down on soft drinks and is now just drinking tea. She is losing some weight because she doesn't eat as much when she is bloated. Endorses some SOB due to the humidity and her known COPD.  Wt Readings from Last 3 Encounters:  07/15/22 92 lb (41.7 kg)  06/03/22 92 lb 12.8 oz (42.1 kg)  01/05/22 97 lb 2 oz (44.1 kg)   Current Outpatient Medications  Medication Sig Dispense Refill   acetaminophen (TYLENOL) 500 MG tablet Take 500 mg by mouth every 6 (six) hours as needed.     albuterol (PROVENTIL HFA;VENTOLIN HFA) 108 (90 BASE) MCG/ACT inhaler Inhale 2 puffs into the lungs every 6 (six) hours as needed for wheezing or shortness of breath. Reported on 02/06/2015     albuterol (PROVENTIL) (2.5 MG/3ML) 0.083% nebulizer solution Take 2.5 mg by nebulization every 6 (six) hours as needed for wheezing or shortness of breath.     alendronate (FOSAMAX) 70 MG tablet Take 1 tablet (70 mg total) by mouth once a week. Take with a full glass of water on an empty stomach. 12 tablet 3   ALPRAZolam (XANAX) 1 MG tablet Take 1 mg by mouth at bedtime as needed.     aspirin EC 81 MG tablet Take 1 tablet (81 mg total) by mouth daily. Swallow whole. 90 tablet 3   cholecalciferol (VITAMIN D3) 25 MCG (1000 UNIT) tablet Take 2,000 Units by mouth daily.     ipratropium-albuterol (DUONEB) 0.5-2.5 (3) MG/3ML SOLN Take 3 mLs by nebulization  every 4 (four) hours as needed. 360 mL 0   omeprazole (PRILOSEC) 40 MG capsule Take 1 capsule (40 mg total) by mouth 2 (two) times daily. 60 capsule 2   rosuvastatin (CRESTOR) 5 MG tablet TAKE ONE TABLET (5 MG TOTAL) BY MOUTH DAILY. 100 tablet 2   predniSONE (DELTASONE) 20 MG tablet Take 2 tablets (40 mg total) by mouth daily with breakfast. (Patient not taking: Reported on 07/15/2022) 10 tablet 0   No current facility-administered medications for this visit.   Allergies  Allergen Reactions   Avelox [Moxifloxacin Hcl In Nacl] Rash   Review of Systems: All systems reviewed and negative except where noted in HPI.   Physical Exam: BP 118/70   Pulse 91   Ht 5' (1.524 m)   Wt 92 lb (41.7 kg)   BMI 17.97 kg/m  Constitutional: Pleasant,well-developed, female in no acute distress. HEENT: Normocephalic and atraumatic. Conjunctivae are normal. No scleral icterus. Cardiovascular: Normal rate, regular rhythm.  Pulmonary/chest: Wheezing present Abdominal: Soft, nondistended, tender in scattered areas around the abdomen. Bowel sounds active throughout. There are no masses palpable. No hepatomegaly. Extremities: No edema Neurological: Alert and oriented to person place and time. Skin: Skin is warm and dry. No rashes noted. Psychiatric: Normal mood and affect. Behavior is normal.  Labs 03/2021: CMP unremarkable.  Labs 06/2021: CBC unremarkable.  Labs  07/2021: TSH nml.   Labs 10/2021: CMP and CBC unremarkable.  Ab U/S 06/16/21: IMPRESSION: Cholelithiasis without secondary signs of acute cholecystitis. Possible 4 mm left renal stone.  HIDA scan 08/03/21: FINDINGS: Prompt uptake and biliary excretion of activity by the liver is seen. Gallbladder activity is visualized, consistent with patency of cystic duct. Biliary activity passes into small bowel, consistent with patent common bile duct. Calculated gallbladder ejection fraction is 84%. (Normal gallbladder ejection fraction with Ensure is  greater than 33%.) IMPRESSION: 1.  Patent cystic and common bile ducts. 2.  Normal gallbladder ejection fraction.  EGD 09/16/21: - White nummular lesions in esophageal mucosa. Biopsied. - Nodular mucosa in the esophagus. Biopsied. - Gastritis. Biopsied. - Duodenitis. Biopsied. Path: 1. Surgical [P], duodenal - DUODENAL MUCOSA WITH NO SPECIFIC HISTOPATHOLOGIC CHANGES - NEGATIVE FOR INCREASED INTRAEPITHELIAL LYMPHOCYTES OR VILLOUS ARCHITECTURAL CHANGES 2. Surgical [P], gastric - GASTRIC ANTRAL AND OXYNTIC MUCOSA WITH NO SPECIFIC HISTOPATHOLOGIC CHANGES - HELICOBACTER PYLORI-LIKE ORGANISMS ARE NOT IDENTIFIED ON ROUTINE H&E STAIN 3. Surgical [P], nodular GE junction mucosa - ESOPHAGEAL SQUAMOUS AND CARDIAC MUCOSA WITH REFLUX-ASSOCIATED CHANGES - NEGATIVE FOR INTESTINAL METAPLASIA OR DYSPLASIA 4. Surgical [P], esophagus - ESOPHAGEAL SQUAMOUS MUCOSA WITH MILD VASCULAR CONGESTION, AND FOCAL SQUAMOUS BALLOONING, SUGGESTIVE OF REFLUX ESOPHAGITIS - NEGATIVE FOR INCREASED INTRAEPITHELIAL EOSINOPHILS  Colonoscopy 09/16/21: - The examined portion of the ileum was normal. - One 3 mm polyp in the cecum, removed with a cold snare. Resected and retrieved. - Two 3 to 5 mm polyps in the sigmoid colon, removed with a cold snare. Resected and retrieved. - Diverticulosis in the sigmoid colon. - Non-bleeding internal hemorrhoids. Path: 5. Surgical [P], colon, cecum, polyp (1) - TUBULAR ADENOMA WITHOUT HIGH-GRADE DYSPLASIA OR MALIGNANCY 6. Surgical [P], colon, sigmoid, polyp (2) - TUBULAR ADENOMA WITHOUT HIGH-GRADE DYSPLASIA OR MALIGNANCY - HYPERPLASTIC POLYP(S)  ASSESSMENT AND PLAN: Dyspepsia Bloating GERD Constipation Weight loss History of colon polyps Gallstones Patient presents with bloating that worsens throughout the day.  I did tell the patient that some degree of bloating is physiologic, but will try some therapies to see if this helps with her bloating.  Patient does describe some  underlying constipation so we will start her on daily MiraLAX.  I also encouraged her to drink enough water since her recent labs have suggested that she is dehydrated.  I encouraged her to follow the low FODMAP diet to see if this helps with her bloating as well.  Patient has dropped a few pounds since her last visit so I instructed her to use nutritional supplements to prevent weight loss.  Will attempt to decrease her omeprazole from twice daily to daily in order to get her on the lowest dose that is still effective for keeping her symptoms under control. - Will give another handout of low FODMAP diet - Encourage drinking 8 cups of water per day - Encourage use of nutritional supplements to prevent weight loss - Decrease omeprazole 40 mg from BID to every day before dinner - Start Miralax every day - Okay to use Tums PRN for breakthrough symptoms - Recommend that she follow up with PCP about wheezing heard on exam and some SOB - RTC 2 months  Eulah Pont, MD  I spent 40 minutes of time, including in depth chart review, independent review of results as outlined above, communicating results with the patient directly, face-to-face time with the patient, coordinating care, ordering studies and medications as appropriate, and documentation.

## 2022-07-16 ENCOUNTER — Ambulatory Visit
Admission: EM | Admit: 2022-07-16 | Discharge: 2022-07-16 | Disposition: A | Discharge status: Home / Self Care | Payer: Medicare HMO

## 2022-07-16 ENCOUNTER — Encounter: Payer: Self-pay | Admitting: Emergency Medicine

## 2022-07-16 DIAGNOSIS — J441 Chronic obstructive pulmonary disease with (acute) exacerbation: Secondary | ICD-10-CM | POA: Diagnosis not present

## 2022-07-16 MED ORDER — DEXAMETHASONE SODIUM PHOSPHATE 10 MG/ML IJ SOLN
10.0000 mg | Freq: Once | INTRAMUSCULAR | Status: AC
  Administered 2022-07-16: 10 mg via INTRAMUSCULAR

## 2022-07-16 MED ORDER — PREDNISONE 20 MG PO TABS: 40.0000 mg | ORAL_TABLET | Freq: Every day | ORAL | 0 refills | Status: DC

## 2022-07-16 NOTE — ED Provider Notes (Signed)
[facility]    CSN: [id number] Arrival date & time: 07/16/22  1157      History   Chief Complaint No chief complaint on file.   HPI Misty Richardson is a 67 y.o. female.   Patient presented today with 1 week history of progressively worsening shortness of breath, wheezing, cough.  Denies fever, chills, congestion, chest pain, abdominal pain, nausea vomiting or diarrhea.  History of severe COPD on Breo, DuoNeb treatments, albuterol as needed.  States with the humidity outside the inhalers and breathing treatments have not been enough to keep her symptoms under control.    Past Medical History:  Diagnosis Date   Anxiety    COPD (chronic obstructive pulmonary disease) (HCC)    Gallstones    GERD (gastroesophageal reflux disease)    MGUS (monoclonal gammopathy of unknown significance)    Osteoporosis     Patient Active Problem List   Diagnosis Date Noted   Osteoporosis 03/14/2014   Tobacco use disorder 09/19/2012   MGUS (monoclonal gammopathy of unknown significance) 09/05/2012   COPD (chronic obstructive pulmonary disease) (HCC) 09/05/2012    Past Surgical History:  Procedure Laterality Date   ABDOMINAL HYSTERECTOMY     APPENDECTOMY     BONE BIOPSY Left 03/25/2014   BONE MARROW ASPIRATION Left 03/25/2014   BONE MARROW BIOPSY Left 03/19/2014   CESAREAN SECTION     x2    OB History     Gravida  2   Para  2   Term      Preterm      AB      Living  2      SAB      IAB      Ectopic      Multiple      Live Births               Home Medications    Prior to Admission medications   Medication Sig Start Date End Date Taking? Authorizing Provider  fluticasone furoate-vilanterol (BREO ELLIPTA) 100-25 MCG/ACT AEPB Inhale 1 puff into the lungs daily.   Yes [provider]  predniSONE (DELTASONE) 20 MG tablet Take 2 tablets (40 mg total) by mouth daily with breakfast. 07/16/22  Yes Particia Nearing, PA-C  acetaminophen  (TYLENOL) 500 MG tablet Take 500 mg by mouth every 6 (six) hours as needed.    [provider]  albuterol (PROVENTIL HFA;VENTOLIN HFA) 108 (90 BASE) MCG/ACT inhaler Inhale 2 puffs into the lungs every 6 (six) hours as needed for wheezing or shortness of breath. Reported on 02/06/2015    [provider]  albuterol (PROVENTIL) (2.5 MG/3ML) 0.083% nebulizer solution Take 2.5 mg by nebulization every 6 (six) hours as needed for wheezing or shortness of breath.    [provider]  alendronate (FOSAMAX) 70 MG tablet Take 1 tablet (70 mg total) by mouth once a week. Take with a full glass of water on an empty stomach. 02/04/22   [name], PA-C  ALPRAZolam Prudy Feeler) 1 MG tablet Take 1 mg by mouth at bedtime as needed. 12/02/16   [provider]  aspirin EC 81 MG tablet Take 1 tablet (81 mg total) by mouth daily. Swallow whole. 03/27/20   Jonelle Sidle, MD  cholecalciferol (VITAMIN D3) 25 MCG (1000 UNIT) tablet Take 2,000 Units by mouth daily.    [provider]  ipratropium-albuterol (DUONEB) 0.5-2.5 (3) MG/3ML SOLN Take 3 mLs by nebulization every 4 (four) hours as  needed. 09/17/21   Particia Nearing, PA-C  omeprazole (PRILOSEC) 40 MG capsule Take 1 capsule (40 mg total) by mouth daily. 07/15/22   Imogene Burn, MD  rosuvastatin (CRESTOR) 5 MG tablet TAKE ONE TABLET (5 MG TOTAL) BY MOUTH DAILY. 09/28/21   Jonelle Sidle, MD    Family History Family History  Problem Relation Age of Onset   Arthritis Mother    Heart failure Father    Colon cancer Maternal Grandmother    Esophageal cancer Paternal Grandfather    Stomach cancer Neg Hx    Colon polyps Neg Hx     Social History Social History   Tobacco Use   Smoking status: Every Day    Packs/day: .5    Types: Cigarettes    Last attempt to quit: 07/11/2017    Years since quitting: 5.0   Smokeless tobacco: Former    Quit date: 07/31/2012  Vaping Use   Vaping Use: Never used   Substance Use Topics   Alcohol use: Yes    Comment: occasionally   Drug use: No     Allergies   Avelox [moxifloxacin hcl in nacl]   Review of Systems Review of Systems Per HPI  Physical Exam Triage Vital Signs ED Triage Vitals  Enc Vitals Group     BP 07/16/22 1328 115/75     Pulse Rate 07/16/22 1328 89     Resp 07/16/22 1328 18     Temp 07/16/22 1328 98 F (36.7 C)     Temp Source 07/16/22 1328 Oral     SpO2 07/16/22 1328 90 %     Weight --      Height --      Head Circumference --      Peak Flow --      Pain Score 07/16/22 1330 5     Pain Loc --      Pain Edu? --      Excl. in GC? --    No data found.  Updated Vital Signs BP 115/75 (BP Location: Right Arm)   Pulse 89   Temp 98 F (36.7 C) (Oral)   Resp 18   SpO2 90%   Visual Acuity Right Eye Distance:   Left Eye Distance:   Bilateral Distance:    Right Eye Near:   Left Eye Near:    Bilateral Near:     Physical Exam Vitals and nursing note reviewed.  Constitutional:      Appearance: Normal appearance.  HENT:     Head: Atraumatic.     Right Ear: Tympanic membrane and external ear normal.     Left Ear: Tympanic membrane and external ear normal.     Nose: Nose normal.     Mouth/Throat:     Mouth: Mucous membranes are moist.     Pharynx: No posterior oropharyngeal erythema.  Eyes:     Extraocular Movements: Extraocular movements intact.     Conjunctiva/sclera: Conjunctivae normal.  Cardiovascular:     Rate and Rhythm: Normal rate and regular rhythm.     Heart sounds: Normal heart sounds.  Pulmonary:     Effort: Pulmonary effort is normal. No respiratory distress.     Breath sounds: Wheezing present. No rales.  Musculoskeletal:        General: Normal range of motion.     Cervical back: Normal range of motion and neck supple.  Skin:    General: Skin is warm and dry.  Neurological:     Mental Status:  She is alert and oriented to person, place, and time.  Psychiatric:        Mood and  Affect: Mood normal.        Thought Content: Thought content normal.      UC Treatments / Results  Labs (all labs ordered are listed, but only abnormal results are displayed) Labs Reviewed - No data to display  EKG   Radiology No results found.  Procedures Procedures (including critical care time)  Medications Ordered in UC Medications  dexamethasone (DECADRON) injection 10 mg (10 mg Intramuscular Given 07/16/22 1358)    Initial Impression / Assessment and Plan / UC Course  I have reviewed the triage vital signs and the nursing notes.  Pertinent labs & imaging results that were available during my care of the patient were reviewed by me and considered in my medical decision making (see chart for details).     Consistent with COPD exacerbation.  Oxygen saturation at 90% on room air, patient appears in no acute distress today and this is her baseline oxygen saturation.  Treated with IM Decadron, continued nebulizer treatments and inhaler regimen.  Prednisone sent in case not improving over the next 4 to 5 days.  Follow-up with primary care for recheck, return sooner for worsening symptoms.  Final Clinical Impressions(s) / UC Diagnoses   Final diagnoses:  COPD exacerbation [facility])   Discharge Instructions   None    ED Prescriptions     Medication Sig Dispense Auth. Provider   predniSONE (DELTASONE) 20 MG tablet Take 2 tablets (40 mg total) by mouth daily with breakfast. 10 tablet Particia Nearing, [place]      PDMP not reviewed this encounter.   Particia Nearing, [place] 07/16/22 1413

## 2022-07-16 NOTE — ED Triage Notes (Signed)
SOB and productive cough x 1 week.

## 2022-08-06 ENCOUNTER — Other Ambulatory Visit: Payer: Self-pay

## 2022-08-06 ENCOUNTER — Encounter: Payer: Self-pay | Admitting: Cardiology

## 2022-08-06 ENCOUNTER — Ambulatory Visit: Payer: Medicare HMO | Attending: Cardiology | Admitting: Cardiology

## 2022-08-06 VITALS — BP 118/70 | HR 92 | Ht 60.0 in | Wt 92.0 lb

## 2022-08-06 DIAGNOSIS — I2510 Atherosclerotic heart disease of native coronary artery without angina pectoris: Secondary | ICD-10-CM

## 2022-08-06 DIAGNOSIS — E782 Mixed hyperlipidemia: Secondary | ICD-10-CM | POA: Diagnosis not present

## 2022-08-06 MED ORDER — ROSUVASTATIN CALCIUM 10 MG PO TABS: 10.0000 mg | ORAL_TABLET | Freq: Every day | ORAL | 3 refills | Status: DC

## 2022-08-06 NOTE — Progress Notes (Signed)
    Cardiology Office Note  Date: 08/06/2022   ID: [name], [name] Aug 14, 1955, MRN [id number]  History of Present Illness: Misty Richardson is a 67 y.o. female last seen in June 2023 by Ms. Strader PA-C, I reviewed the note. She is here for a routing visit. Reports stable NYHA class 2-3 dyspnea in the setting of COPD. No chest pain or palpitations.  I reviewed her medications. We discussed increasing Crestor to 10 mg daily in light of LDL 86 in May. She is also on ASA.  I reviewed her chest CT report from March.  ECG today shows sinus rhythm.  Physical Exam: VS:  BP 118/70 (BP Location: Right Arm, Patient Position: Sitting, Cuff Size: Normal)   Pulse 92   Ht 5' (1.524 m)   Wt 92 lb (41.7 kg)   SpO2 95%   BMI 17.97 kg/m , BMI Body mass index is 17.97 kg/m.  Wt Readings from Last 3 Encounters:  08/06/22 92 lb (41.7 kg)  07/15/22 92 lb (41.7 kg)  06/03/22 92 lb 12.8 oz (42.1 kg)    General: Patient appears comfortable at rest. HEENT: Conjunctiva and lids normal Neck: Supple, no elevated JVP or carotid bruits. Lungs: Decreased breath sounds without wheezing, nonlabored breathing at rest. Cardiac: Regular rate and rhythm, no S3 or significant systolic murmur. Extremities: No pitting edema.  ECG:  An ECG dated 08/17/2021 was personally reviewed today and demonstrated:  Sinus rhythm.  Labwork: 08/13/2021: TSH 3.15 05/27/2022: ALT 22; AST 26; BUN 15; Creatinine, Ser 0.73; Hemoglobin 13.8; Platelets 355; Potassium 3.7; Sodium 132  May 2024: Cholesterol 155, triglycerides 99, HDL 51, LDL 86  Other Studies Reviewed Today:  Chest CT 04/09/2022: IMPRESSION: 1. Lung-RADS 2, benign appearance or behavior. Continue annual screening with low-dose chest CT without contrast in 12 months. 2.  Aortic atherosclerosis (ICD10-I70.0). 3.  Emphysema (ICD10-J43.9).  Assessment and Plan:  1. Coronary and aortic calcification by CT imaging. No angina and ECG normal. Plan to continue  medication therapy including ASA and statin. Have discussed warning signs and symptoms that would prompt ischemic testing.  2. Mixed hyperlipidemia, LDL 86 in May. Increase Crestor to 10 mg daily to get toward goal with vascular disease.  3. COPD.  4. MGUS, followed by Hematology.  Disposition:  Follow up  1 year.  Signed, Jonelle Sidle, M.D., F.A.C.C. [facility] at [facility]

## 2022-08-06 NOTE — Patient Instructions (Signed)
Medication Instructions:   INCREASE Crestor to 10 mg daily Labwork: None today  Testing/Procedures: None today  Follow-Up: 1 year  Any Other Special Instructions Will Be Listed Below (If Applicable).  If you need a refill on your cardiac medications before your next appointment, please call your pharmacy.

## 2022-08-09 NOTE — Addendum Note (Signed)
Addended by: Kerney Elbe on: 08/09/2022 08:21 AM   Modules accepted: Orders

## 2022-08-10 ENCOUNTER — Other Ambulatory Visit: Payer: Self-pay

## 2022-08-10 ENCOUNTER — Ambulatory Visit
Admission: EM | Admit: 2022-08-10 | Discharge: 2022-08-10 | Disposition: A | Discharge status: Home / Self Care | Payer: Medicare HMO | Attending: Nurse Practitioner | Admitting: Nurse Practitioner

## 2022-08-10 ENCOUNTER — Encounter: Payer: Self-pay | Admitting: Emergency Medicine

## 2022-08-10 DIAGNOSIS — J441 Chronic obstructive pulmonary disease with (acute) exacerbation: Secondary | ICD-10-CM | POA: Diagnosis not present

## 2022-08-10 MED ORDER — IPRATROPIUM-ALBUTEROL 0.5-2.5 (3) MG/3ML IN SOLN
3.0000 mL | Freq: Once | RESPIRATORY_TRACT | Status: AC
  Administered 2022-08-10: 3 mL via RESPIRATORY_TRACT

## 2022-08-10 MED ORDER — AZITHROMYCIN 250 MG PO TABS: ORAL_TABLET | ORAL | 0 refills | Status: DC

## 2022-08-10 MED ORDER — DEXAMETHASONE SODIUM PHOSPHATE 10 MG/ML IJ SOLN
10.0000 mg | Freq: Once | INTRAMUSCULAR | Status: AC
  Administered 2022-08-10: 10 mg via INTRAMUSCULAR

## 2022-08-10 MED ORDER — KETOROLAC TROMETHAMINE 30 MG/ML IJ SOLN
30.0000 mg | Freq: Once | INTRAMUSCULAR | Status: AC
  Administered 2022-08-10: 30 mg via INTRAMUSCULAR

## 2022-08-10 NOTE — ED Provider Notes (Signed)
[facility]    CSN: [id number] Arrival date & time: 08/10/22  1703      History   Chief Complaint Chief Complaint  Patient presents with   Cough    HPI SHANNEL ZAHM is a 67 y.o. female.   Patient presents today with 1 to 2-day history of congested cough with pale yellow/clear mucus, shortness of breath, chest tightness and bilateral lower rib cage pain from coughing, wheezing, decreased appetite, and fatigue.  Reports symptoms began after mowing her yard.  No fever, body aches or chills, chest pain, runny or stuffy nose, sore throat, headache, ear pain, abdominal pain, nausea/vomiting, or diarrhea.  No known sick contacts.  Has been using nebulizer at home, albuterol rescue inhaler with minimal improvement in symptoms.  Reports breathing improved when she got to a cold environment in urgent care.  Reports she was treated for COPD exacerbation approximately 1 month ago and fully got better until yesterday.  Last antibiotic use and azithromycin more than 6 months ago.  She is taking Breo daily as prescribed by PCP.    Past Medical History:  Diagnosis Date   Anxiety    COPD (chronic obstructive pulmonary disease) (HCC)    Gallstones    GERD (gastroesophageal reflux disease)    MGUS (monoclonal gammopathy of unknown significance)    Osteoporosis     Patient Active Problem List   Diagnosis Date Noted   Osteoporosis 03/14/2014   Tobacco use disorder 09/19/2012   MGUS (monoclonal gammopathy of unknown significance) 09/05/2012   COPD (chronic obstructive pulmonary disease) (HCC) 09/05/2012    Past Surgical History:  Procedure Laterality Date   ABDOMINAL HYSTERECTOMY     APPENDECTOMY     BONE BIOPSY Left 03/25/2014   BONE MARROW ASPIRATION Left 03/25/2014   BONE MARROW BIOPSY Left 03/19/2014   CESAREAN SECTION     x2    OB History     Gravida  2   Para  2   Term      Preterm      AB      Living  2      SAB      IAB      Ectopic       Multiple      Live Births               Home Medications    Prior to Admission medications   Medication Sig Start Date End Date Taking? Authorizing Provider  azithromycin (ZITHROMAX) 250 MG tablet Take (2) tablets by mouth on day 1, then take (1) tablet by mouth on days 2-5. 08/10/22  Yes Valentino Nose, NP  acetaminophen (TYLENOL) 500 MG tablet Take 500 mg by mouth every 6 (six) hours as needed.    [provider]  albuterol (PROVENTIL HFA;VENTOLIN HFA) 108 (90 BASE) MCG/ACT inhaler Inhale 2 puffs into the lungs every 6 (six) hours as needed for wheezing or shortness of breath. Reported on 02/06/2015    [provider]  albuterol (PROVENTIL) (2.5 MG/3ML) 0.083% nebulizer solution Take 2.5 mg by nebulization every 6 (six) hours as needed for wheezing or shortness of breath.    [provider]  alendronate (FOSAMAX) 70 MG tablet Take 1 tablet (70 mg total) by mouth once a week. Take with a full glass of water on an empty stomach. 02/04/22   [name], PA-C  ALPRAZolam Prudy Feeler) 1 MG tablet Take 1 mg by mouth at bedtime as needed. 12/02/16  [provider]  aspirin EC 81 MG tablet Take 1 tablet (81 mg total) by mouth daily. Swallow whole. 03/27/20   Jonelle Sidle, MD  cholecalciferol (VITAMIN D3) 25 MCG (1000 UNIT) tablet Take 2,000 Units by mouth daily.    [provider]  fluticasone furoate-vilanterol (BREO ELLIPTA) 100-25 MCG/ACT AEPB Inhale 1 puff into the lungs daily.    [provider]  ipratropium-albuterol (DUONEB) 0.5-2.5 (3) MG/3ML SOLN Take 3 mLs by nebulization every 4 (four) hours as needed. 09/17/21   Particia Nearing, PA-C  omeprazole (PRILOSEC) 40 MG capsule Take 1 capsule (40 mg total) by mouth daily. 07/15/22   Imogene Burn, MD  predniSONE (DELTASONE) 20 MG tablet Take 2 tablets (40 mg total) by mouth daily with breakfast. 07/16/22   Particia Nearing, PA-C  rosuvastatin (CRESTOR) 10 MG tablet  Take 1 tablet (10 mg total) by mouth daily. 08/06/22 11/04/22  Jonelle Sidle, MD    Family History Family History  Problem Relation Age of Onset   Arthritis Mother    Heart failure Father    Colon cancer Maternal Grandmother    Esophageal cancer Paternal Grandfather    Stomach cancer Neg Hx    Colon polyps Neg Hx     Social History Social History   Tobacco Use   Smoking status: Every Day    Current packs/day: 0.00    Types: Cigarettes    Last attempt to quit: 07/11/2017    Years since quitting: 5.0   Smokeless tobacco: Former    Quit date: 07/31/2012  Vaping Use   Vaping status: Never Used  Substance Use Topics   Alcohol use: Yes    Comment: occasionally   Drug use: No     Allergies   Avelox [moxifloxacin hcl in nacl]   Review of Systems Review of Systems Per HPI  Physical Exam Triage Vital Signs ED Triage Vitals  Encounter Vitals Group     BP 08/10/22 1744 104/66     Systolic BP Percentile --      Diastolic BP Percentile --      Pulse Rate 08/10/22 1744 95     Resp 08/10/22 1744 20     Temp 08/10/22 1744 97.9 F (36.6 C)     Temp Source 08/10/22 1744 Oral     SpO2 08/10/22 1744 91 %     Weight --      Height --      Head Circumference --      Peak Flow --      Pain Score 08/10/22 1743 4     Pain Loc --      Pain Education --      Exclude from Growth Chart --    No data found.  Updated Vital Signs BP 104/66 (BP Location: Right Arm)   Pulse 95   Temp 97.9 F (36.6 C) (Oral)   Resp 20   SpO2 91%   SpO2 recheck after breathing treatment: 95% room air  Visual Acuity Right Eye Distance:   Left Eye Distance:   Bilateral Distance:    Right Eye Near:   Left Eye Near:    Bilateral Near:     Physical Exam Vitals and nursing note reviewed.  Constitutional:      General: She is not in acute distress.    Appearance: Normal appearance. She is not ill-appearing or toxic-appearing.  HENT:     Head: Normocephalic and atraumatic.     Right  Ear: Tympanic  membrane, ear canal and external ear normal.     Left Ear: Tympanic membrane, ear canal and external ear normal.     Nose: No congestion or rhinorrhea.     Mouth/Throat:     Mouth: Mucous membranes are moist.     Pharynx: Oropharynx is clear. No oropharyngeal exudate or posterior oropharyngeal erythema.  Eyes:     General: No scleral icterus.    Extraocular Movements: Extraocular movements intact.  Cardiovascular:     Rate and Rhythm: Normal rate and regular rhythm.     Heart sounds: Normal heart sounds. No murmur heard. Pulmonary:     Effort: Pulmonary effort is normal. No respiratory distress.     Breath sounds: Decreased air movement present. Wheezing present. No rhonchi or rales.  Musculoskeletal:     Cervical back: Normal range of motion and neck supple.  Lymphadenopathy:     Cervical: No cervical adenopathy.  Skin:    General: Skin is warm and dry.     Coloration: Skin is not jaundiced or pale.     Findings: No erythema or rash.  Neurological:     Mental Status: She is alert and oriented to person, place, and time.  Psychiatric:        Behavior: Behavior is cooperative.      UC Treatments / Results  Labs (all labs ordered are listed, but only abnormal results are displayed) Labs Reviewed - No data to display  EKG   Radiology No results found.  Procedures Procedures (including critical care time)  Medications Ordered in UC Medications  ipratropium-albuterol (DUONEB) 0.5-2.5 (3) MG/3ML nebulizer solution 3 mL (3 mLs Nebulization Given 08/10/22 1826)  ketorolac (TORADOL) 30 MG/ML injection 30 mg (30 mg Intramuscular Given 08/10/22 1848)  dexamethasone (DECADRON) injection 10 mg (10 mg Intramuscular Given 08/10/22 1848)    Initial Impression / Assessment and Plan / UC Course  I have reviewed the triage vital signs and the nursing notes.  Pertinent labs & imaging results that were available during my care of the patient were reviewed by me and  considered in my medical decision making (see chart for details).   Patient is well-appearing, normotensive, afebrile, not tachycardic, not tachypneic, oxygenating well on room air.    1. COPD exacerbation (HCC) DuoNeb breathing treatment given with improvement in SpO2 from 91% on room air to 95% on room air Suspect COPD exacerbation secondary to allergies/humidity Patient declines viral testing today including COVID-19 testing Continue breathing treatments at home, Breo Treat with azithromycin, IM Decadron 10 mg in urgent care today Start Mucinex, push hydration with fluids Strict ER return precautions discussed with patient  The patient was given the opportunity to ask questions.  All questions answered to their satisfaction.  The patient is in agreement to this plan.    Final Clinical Impressions(s) / UC Diagnoses   Final diagnoses:  COPD exacerbation [facility])     Discharge Instructions      EKG today looks stable.  Breathing treatment helped increase your oxygen levels.  Continue breathing treatments at home.  We have given you a shot of Decadron and Toradol today for the pain and to help with inflammation in your lungs.  Take the azithromycin as prescribed to help treat the COPD exacerbation.  Follow-up with PCP if symptoms persist or worsen despite treatment.     ED Prescriptions     Medication Sig Dispense Auth. Provider   azithromycin (ZITHROMAX) 250 MG tablet Take (2) tablets by mouth on day 1, then  take (1) tablet by mouth on days 2-5. 6 tablet Valentino Nose, NP      PDMP not reviewed this encounter.   Valentino Nose, NP 08/10/22 [phone]

## 2022-08-10 NOTE — Discharge Instructions (Signed)
EKG today looks stable.  Breathing treatment helped increase your oxygen levels.  Continue breathing treatments at home.  We have given you a shot of Decadron and Toradol today for the pain and to help with inflammation in your lungs.  Take the azithromycin as prescribed to help treat the COPD exacerbation.  Follow-up with PCP if symptoms persist or worsen despite treatment.

## 2022-08-10 NOTE — ED Triage Notes (Signed)
Pt reports bilateral lower rib cage pain, cough, intermittent wheezing since yesterday after mowing yard. Has been using at home nebs/inhaler with no change in dyspnea. Airway patent. Pt able to speak clear completed sentences.

## 2022-08-14 ENCOUNTER — Encounter (HOSPITAL_COMMUNITY): Payer: Self-pay | Admitting: *Deleted

## 2022-08-14 ENCOUNTER — Emergency Department (HOSPITAL_COMMUNITY)
Admission: EM | Admit: 2022-08-14 | Discharge: 2022-08-14 | Disposition: A | Discharge status: Home / Self Care | Payer: Medicare HMO | Attending: Emergency Medicine | Admitting: Emergency Medicine

## 2022-08-14 ENCOUNTER — Other Ambulatory Visit: Payer: Self-pay

## 2022-08-14 ENCOUNTER — Emergency Department (HOSPITAL_COMMUNITY): Payer: Medicare HMO

## 2022-08-14 DIAGNOSIS — Z7951 Long term (current) use of inhaled steroids: Secondary | ICD-10-CM | POA: Insufficient documentation

## 2022-08-14 DIAGNOSIS — Z7982 Long term (current) use of aspirin: Secondary | ICD-10-CM | POA: Insufficient documentation

## 2022-08-14 DIAGNOSIS — J441 Chronic obstructive pulmonary disease with (acute) exacerbation: Secondary | ICD-10-CM | POA: Insufficient documentation

## 2022-08-14 DIAGNOSIS — F17200 Nicotine dependence, unspecified, uncomplicated: Secondary | ICD-10-CM | POA: Diagnosis not present

## 2022-08-14 DIAGNOSIS — R0602 Shortness of breath: Secondary | ICD-10-CM | POA: Diagnosis not present

## 2022-08-14 LAB — BASIC METABOLIC PANEL
Anion gap: 7 (ref 5–15)
BUN: 16 mg/dL (ref 8–23)
CO2: 23 mmol/L (ref 22–32)
Calcium: 8.5 mg/dL — ABNORMAL LOW (ref 8.9–10.3)
Chloride: 107 mmol/L (ref 98–111)
Creatinine, Ser: 0.71 mg/dL (ref 0.44–1.00)
GFR, Estimated: >60 mL/min (ref >60)
Glucose, Bld: 83 mg/dL (ref 70–99)
Potassium: 3.7 mmol/L (ref 3.5–5.1)
Sodium: 137 mmol/L (ref 135–145)

## 2022-08-14 LAB — CBC
HCT: 41.9 % (ref 36.0–46.0)
Hemoglobin: 13.8 g/dL (ref 12.0–15.0)
MCH: 30.7 pg (ref 26.0–34.0)
MCHC: 32.9 g/dL (ref 30.0–36.0)
MCV: 93.1 fL (ref 80.0–100.0)
Platelets: 311 10*3/uL (ref 150–400)
RBC: 4.5 MIL/uL (ref 3.87–5.11)
RDW: 13.3 % (ref 11.5–15.5)
WBC: 9.1 10*3/uL (ref 4.0–10.5)
nRBC: 0 % (ref 0.0–0.2)

## 2022-08-14 LAB — TROPONIN I (HIGH SENSITIVITY): Troponin I (High Sensitivity): <2 ng/L (ref <18)

## 2022-08-14 LAB — D-DIMER, QUANTITATIVE: D-Dimer, Quant: 0.33 ug/mL-FEU (ref 0.00–0.50)

## 2022-08-14 MED ORDER — DOXYCYCLINE HYCLATE 100 MG PO CAPS: 100.0000 mg | ORAL_CAPSULE | Freq: Two times a day (BID) | ORAL | 0 refills | Status: DC

## 2022-08-14 MED ORDER — METHYLPREDNISOLONE 4 MG PO TBPK: ORAL_TABLET | ORAL | 0 refills | Status: DC

## 2022-08-14 MED ORDER — MAGNESIUM SULFATE 2 GM/50ML IV SOLN
2.0000 g | INTRAVENOUS | Status: AC
  Administered 2022-08-14: 2 g via INTRAVENOUS
  Filled 2022-08-14: qty 50

## 2022-08-14 MED ORDER — ALBUTEROL SULFATE (2.5 MG/3ML) 0.083% IN NEBU
10.0000 mg | INHALATION_SOLUTION | RESPIRATORY_TRACT | Status: AC
  Administered 2022-08-14: 10 mg via RESPIRATORY_TRACT
  Filled 2022-08-14: qty 12

## 2022-08-14 MED ORDER — KETOROLAC TROMETHAMINE 30 MG/ML IJ SOLN
30.0000 mg | Freq: Once | INTRAMUSCULAR | Status: AC
  Administered 2022-08-14: 30 mg via INTRAVENOUS
  Filled 2022-08-14: qty 1

## 2022-08-14 MED ORDER — METHYLPREDNISOLONE SODIUM SUCC 125 MG IJ SOLR
125.0000 mg | Freq: Once | INTRAMUSCULAR | Status: AC
  Administered 2022-08-14: 125 mg via INTRAVENOUS
  Filled 2022-08-14: qty 2

## 2022-08-14 NOTE — ED Triage Notes (Signed)
Pt c/o feeling sob and back pain; pt states she is coughing up yellow sputum; pt states she was seen at urgent care earlier in the week and given z-pack with nebs and prednisone;

## 2022-08-14 NOTE — ED Provider Notes (Signed)
[place] EMERGENCY DEPARTMENT AT [facility] Provider Note   CSN: [id number] Arrival date & time: 08/14/22  1422     History  Chief Complaint  Patient presents with   Shortness of Breath    Misty Richardson is a 67 y.o. female.   Shortness of Breath  This patient is a 67 year old female, history of COPD, 1 pack/day smoker who also has high cholesterol, she presents to the hospital with increasing chest discomfort shortness of breath wheezing and coughing, productive of yellow sputum, seen in urgent care several days ago and given Zithromax which she has been taking, she was given a shot of Decadron and a shot of Toradol.  She reports that she gets some temporary relief when she takes her albuterol inhaler but then the symptoms come back.  She has not had fevers or swelling of her legs, she just saw her cardiologist recently who upped her cholesterol medicine but she is not known to have any coronary disease.  She does not have any chest pain at this time other than a generalized tightness which she states occurs when she has a COPD flareup.    Home Medications Prior to Admission medications   Medication Sig Start Date End Date Taking? Authorizing Provider  albuterol (PROVENTIL HFA;VENTOLIN HFA) 108 (90 BASE) MCG/ACT inhaler Inhale 2 puffs into the lungs every 6 (six) hours as needed for wheezing or shortness of breath. Reported on 02/06/2015   Yes [provider]  albuterol (PROVENTIL) (2.5 MG/3ML) 0.083% nebulizer solution Take 2.5 mg by nebulization every 6 (six) hours as needed for wheezing or shortness of breath.   Yes [provider]  alendronate (FOSAMAX) 70 MG tablet Take 1 tablet (70 mg total) by mouth once a week. Take with a full glass of water on an empty stomach. 02/04/22  Yes Pennington, Rebekah M, PA-C  ALPRAZolam Prudy Feeler) 1 MG tablet Take 1 mg by mouth at bedtime as needed. 12/02/16  Yes [provider]  aspirin EC 81 MG tablet Take 1  tablet (81 mg total) by mouth daily. Swallow whole. 03/27/20  Yes Jonelle Sidle, MD  azithromycin (ZITHROMAX) 250 MG tablet Take (2) tablets by mouth on day 1, then take (1) tablet by mouth on days 2-5. 08/10/22  Yes Valentino Nose, NP  cholecalciferol (VITAMIN D3) 25 MCG (1000 UNIT) tablet Take 2,000 Units by mouth daily.   Yes [provider]  doxycycline (VIBRAMYCIN) 100 MG capsule Take 1 capsule (100 mg total) by mouth 2 (two) times daily. 08/14/22  Yes Eber Hong, MD  fluticasone furoate-vilanterol (BREO ELLIPTA) 100-25 MCG/ACT AEPB Inhale 1 puff into the lungs daily.   Yes [provider]  ipratropium-albuterol (DUONEB) 0.5-2.5 (3) MG/3ML SOLN Take 3 mLs by nebulization every 4 (four) hours as needed. 09/17/21  Yes Particia Nearing, PA-C  methylPREDNISolone (MEDROL DOSEPAK) 4 MG TBPK tablet Taper over 6 days, 08/14/22  Yes Eber Hong, MD  omeprazole (PRILOSEC) 40 MG capsule Take 1 capsule (40 mg total) by mouth daily. 07/15/22  Yes Imogene Burn, MD  rosuvastatin (CRESTOR) 10 MG tablet Take 1 tablet (10 mg total) by mouth daily. 08/06/22 11/04/22 Yes Jonelle Sidle, MD  acetaminophen (TYLENOL) 500 MG tablet Take 500 mg by mouth every 6 (six) hours as needed.    [provider]      Allergies    Avelox [moxifloxacin hcl in nacl]    Review of Systems   Review of Systems  Respiratory:  Positive for shortness of breath.   All other systems reviewed and are negative.   Physical Exam Updated Vital Signs BP 130/79   Pulse 88   Temp 98.6 F (37 C)   Resp 15   Ht 1.524 m (5')   Wt 41.7 kg   SpO2 92%   BMI 17.95 kg/m  Physical Exam Vitals and nursing note reviewed.  Constitutional:      General: She is not in acute distress.    Appearance: She is well-developed.  HENT:     Head: Normocephalic and atraumatic.     Mouth/Throat:     Pharynx: No oropharyngeal exudate.  Eyes:     General: No scleral icterus.       Right eye: No  discharge.        Left eye: No discharge.     Conjunctiva/sclera: Conjunctivae normal.     Pupils: Pupils are equal, round, and reactive to light.  Neck:     Thyroid: No thyromegaly.     Vascular: No JVD.  Cardiovascular:     Rate and Rhythm: Normal rate and regular rhythm.     Heart sounds: Normal heart sounds. No murmur heard.    No friction rub. No gallop.  Pulmonary:     Effort: Respiratory distress present.     Breath sounds: Wheezing present. No rales.     Comments: There is some expiratory pursed lip breathing, she is able to speak in shortened sentences, she has no rales or rhonchi Abdominal:     General: Bowel sounds are normal. There is no distension.     Palpations: Abdomen is soft. There is no mass.     Tenderness: There is no abdominal tenderness.  Musculoskeletal:        General: No tenderness. Normal range of motion.     Cervical back: Normal range of motion and neck supple.  Lymphadenopathy:     Cervical: No cervical adenopathy.  Skin:    General: Skin is warm and dry.     Findings: No erythema or rash.  Neurological:     Mental Status: She is alert.     Coordination: Coordination normal.  Psychiatric:        Behavior: Behavior normal.     ED Results / Procedures / Treatments   Labs (all labs ordered are listed, but only abnormal results are displayed) Labs Reviewed  BASIC METABOLIC PANEL - Abnormal; Notable for the following components:      Result Value   Calcium 8.5 (*)    All other components within normal limits  CBC  D-DIMER, QUANTITATIVE  TROPONIN I (HIGH SENSITIVITY)    EKG EKG Interpretation Date/Time:  Saturday August 14 2022 15:27:47 EDT Ventricular Rate:  81 PR Interval:  172 QRS Duration:  85 QT Interval:  387 QTC Calculation: 450 R Axis:   88  Text Interpretation: Sinus rhythm Borderline right axis deviation Baseline wander in lead(s) V6 Confirmed by Eber Hong ([id number]) on 08/14/2022 3:40:16 PM  Radiology DG Chest Portable 1  View  Result Date: 08/14/2022 CLINICAL DATA:  sob EXAM: PORTABLE CHEST 1 VIEW COMPARISON:  August 17, 2021 , April 09, 2022 FINDINGS: The cardiomediastinal silhouette is unchanged in contour.Emphysematous changes. No pleural effusion. No pneumothorax. No acute pleuroparenchymal abnormality. IMPRESSION: No acute cardiopulmonary abnormality. Electronically Signed   By: Meda Klinefelter M.D.   On: 08/14/2022 15:17    Procedures Procedures    Medications Ordered in ED Medications  albuterol (PROVENTIL) (2.5 MG/3ML) 0.083% nebulizer solution  10 mg (0 mg Nebulization Stopped 08/14/22 1838)  methylPREDNISolone sodium succinate (SOLU-MEDROL) 125 mg/2 mL injection 125 mg (125 mg Intravenous Given 08/14/22 1551)  magnesium sulfate IVPB 2 g 50 mL (0 g Intravenous Stopped 08/14/22 1634)    ED Course/ Medical Decision Making/ A&P                             Medical Decision Making Amount and/or Complexity of Data Reviewed Labs: ordered. Radiology: ordered.  Risk Prescription drug management.    This patient presents to the ED for concern of worsening shortness of breath and chest discomfort, this involves an extensive number of treatment options, and is a complaint that carries with it a high risk of complications and morbidity.  The differential diagnosis includes COPD exacerbation most likely, consider pneumonia, consider allergy or seasonal related COPD flareup as well.  Could be pulmonary embolism but seems much less likely and she is not tachycardic nor does she have any edema of her legs.  Her exam is more consistent with reactive airway disease.   Co morbidities that complicate the patient evaluation  Chronic tobacco user, still smokes   Additional history obtained:  Additional history obtained from medical record External records from outside source obtained and reviewed including multiple ER visits many for COPD.  She ended up being intubated for her COPD back in 2014 when she was  admitted to the hospital, this was approximately 10 years ago.   Lab Tests:  I Ordered, and personally interpreted labs.  The pertinent results include: Metabolic panel unremarkable, D-dimer negative, CBC without leukocytosis, troponin is negative   Imaging Studies ordered:  I ordered imaging studies including chest x-ray I independently visualized and interpreted imaging which showed hyperexpansion but no acute abnormalities I agree with the radiologist interpretation   Cardiac Monitoring: / EKG:  The patient was maintained on a cardiac monitor.  I personally viewed and interpreted the cardiac monitored which showed an underlying rhythm of: Normal sinus rhythm, heart rate of 88 at discharge   Consultations Obtained: None, patient declines admission and wants to go home   Problem List / ED Course / Critical interventions / Medication management  I had a long discussion with the patient regarding the risk benefits and alternatives of going home versus coming in, she has improved significantly, has ambulated to the bathroom twice, she is not hypoxic when she ambulates measuring between 93 and 95%.  Her visual improvement is significant, she is now speaking in full sentences and though she still has some expiratory wheezing she states that she wants to go home, she understands indications for return and is agreeable. I ordered medication including continuous nebulizer and Solu-Medrol for reactive airway disease Reevaluation of the patient after these medicines showed that the patient significant improvement I have reviewed the patients home medicines and have made adjustments as needed   Social Determinants of Health:  Chronic tobacco use, patient counseled on this   Test / Admission - Considered:  Offered admission but she declines, she is agreeable to return should symptoms worsen, she has medical decision-making capacity.        Final Clinical Impression(s) / ED  Diagnoses Final diagnoses:  COPD exacerbation (HCC)    Rx / DC Orders ED Discharge Orders          Ordered    methylPREDNISolone (MEDROL DOSEPAK) 4 MG TBPK tablet        08/14/22 1855  doxycycline (VIBRAMYCIN) 100 MG capsule  2 times daily        08/14/22 Herbie [place]              Eber Hong, MD 08/14/22 [phone]

## 2022-08-14 NOTE — ED Notes (Signed)
Patient reports, "I am  breathing better, the medication helped." Encourage pt to continue focus on breathing, lights dimmed in room and temperature in room is cooler.

## 2022-08-14 NOTE — ED Notes (Signed)
Patient ambulating in the hallway to bathroom. No signs of dyspnea noted. O2 sat. 96% on  room air while ambulating.

## 2022-08-14 NOTE — Discharge Instructions (Signed)
Your testing has been reassuring, your x-ray shows no pneumonia, your lab work has been reassuring, I suspect that your shortness of breath is coming from your lungs and your chronic lung disease.  Please take the following medications  Medrol Dosepak, this is taken over 6 days, take 6 pills on the first day, 5 pills on the second day, 4 pills on the third day, 3 pills on the fourth day, 2 pills on the fifth and 1 pill on the 6-day.  Use your albuterol inhaler or nebulizer every 4 hours for the next couple of days  Doxycycline twice a day for 10 days  Mandatory follow-up with your doctor within 72 hours, ER for worsening symptoms

## 2022-08-14 NOTE — ED Notes (Signed)
Patient continues to states she is having difficulty breathing, encourage pt to take deep breath, nebulizer treatment continues . O2 sat 100% Repositioned pt. Decreased temperature in room per pt request.  HOB elevated. Respiratory and EDP notified.

## 2022-09-18 DIAGNOSIS — J209 Acute bronchitis, unspecified: Secondary | ICD-10-CM | POA: Diagnosis not present

## 2022-09-18 DIAGNOSIS — J45901 Unspecified asthma with (acute) exacerbation: Secondary | ICD-10-CM | POA: Diagnosis not present

## 2022-09-18 DIAGNOSIS — Z681 Body mass index (BMI) 19 or less, adult: Secondary | ICD-10-CM | POA: Diagnosis not present

## 2022-09-18 DIAGNOSIS — J9801 Acute bronchospasm: Secondary | ICD-10-CM | POA: Diagnosis not present

## 2022-09-18 DIAGNOSIS — R030 Elevated blood-pressure reading, without diagnosis of hypertension: Secondary | ICD-10-CM | POA: Diagnosis not present

## 2022-09-23 DIAGNOSIS — J441 Chronic obstructive pulmonary disease with (acute) exacerbation: Secondary | ICD-10-CM | POA: Diagnosis not present

## 2022-09-23 DIAGNOSIS — Z7952 Long term (current) use of systemic steroids: Secondary | ICD-10-CM | POA: Diagnosis not present

## 2022-09-23 DIAGNOSIS — R0602 Shortness of breath: Secondary | ICD-10-CM | POA: Diagnosis not present

## 2022-09-23 DIAGNOSIS — R0789 Other chest pain: Secondary | ICD-10-CM | POA: Diagnosis not present

## 2022-09-23 DIAGNOSIS — R918 Other nonspecific abnormal finding of lung field: Secondary | ICD-10-CM | POA: Diagnosis not present

## 2022-09-23 DIAGNOSIS — J449 Chronic obstructive pulmonary disease, unspecified: Secondary | ICD-10-CM | POA: Diagnosis not present

## 2022-09-23 DIAGNOSIS — Z87891 Personal history of nicotine dependence: Secondary | ICD-10-CM | POA: Diagnosis not present

## 2022-10-04 ENCOUNTER — Inpatient Hospital Stay: Payer: Medicare HMO | Attending: Hematology

## 2022-10-04 DIAGNOSIS — Z87891 Personal history of nicotine dependence: Secondary | ICD-10-CM | POA: Insufficient documentation

## 2022-10-04 DIAGNOSIS — M8008XA Age-related osteoporosis with current pathological fracture, vertebra(e), initial encounter for fracture: Secondary | ICD-10-CM | POA: Insufficient documentation

## 2022-10-04 DIAGNOSIS — D472 Monoclonal gammopathy: Secondary | ICD-10-CM | POA: Insufficient documentation

## 2022-10-04 LAB — CBC WITH DIFFERENTIAL/PLATELET
Abs Immature Granulocytes: 0.03 10*3/uL (ref 0.00–0.07)
Basophils Absolute: 0 10*3/uL (ref 0.0–0.1)
Basophils Relative: 0 %
Eosinophils Absolute: 0.6 10*3/uL — ABNORMAL HIGH (ref 0.0–0.5)
Eosinophils Relative: 6 %
HCT: 43.4 % (ref 36.0–46.0)
Hemoglobin: 14.3 g/dL (ref 12.0–15.0)
Immature Granulocytes: 0 %
Lymphocytes Relative: 27 %
Lymphs Abs: 2.7 10*3/uL (ref 0.7–4.0)
MCH: 30.1 pg (ref 26.0–34.0)
MCHC: 32.9 g/dL (ref 30.0–36.0)
MCV: 91.4 fL (ref 80.0–100.0)
Monocytes Absolute: 0.8 10*3/uL (ref 0.1–1.0)
Monocytes Relative: 8 %
Neutro Abs: 5.7 10*3/uL (ref 1.7–7.7)
Neutrophils Relative %: 59 %
Platelets: 332 10*3/uL (ref 150–400)
RBC: 4.75 MIL/uL (ref 3.87–5.11)
RDW: 13.6 % (ref 11.5–15.5)
WBC: 9.8 10*3/uL (ref 4.0–10.5)
nRBC: 0 % (ref 0.0–0.2)

## 2022-10-04 LAB — LACTATE DEHYDROGENASE: LDH: 94 U/L — ABNORMAL LOW (ref 98–192)

## 2022-10-04 LAB — COMPREHENSIVE METABOLIC PANEL
ALT: 30 U/L (ref 0–44)
AST: 22 U/L (ref 15–41)
Albumin: 3.4 g/dL — ABNORMAL LOW (ref 3.5–5.0)
Alkaline Phosphatase: 63 U/L (ref 38–126)
Anion gap: 8 (ref 5–15)
BUN: 13 mg/dL (ref 8–23)
CO2: 25 mmol/L (ref 22–32)
Calcium: 8.7 mg/dL — ABNORMAL LOW (ref 8.9–10.3)
Chloride: 102 mmol/L (ref 98–111)
Creatinine, Ser: 0.82 mg/dL (ref 0.44–1.00)
GFR, Estimated: >60 mL/min (ref >60)
Glucose, Bld: 47 mg/dL — ABNORMAL LOW (ref 70–99)
Potassium: 3.8 mmol/L (ref 3.5–5.1)
Sodium: 135 mmol/L (ref 135–145)
Total Bilirubin: 0.6 mg/dL (ref 0.3–1.2)
Total Protein: 7.2 g/dL (ref 6.5–8.1)

## 2022-10-05 LAB — KAPPA/LAMBDA LIGHT CHAINS
Kappa free light chain: 202.4 mg/L — ABNORMAL HIGH (ref 3.3–19.4)
Kappa, lambda light chain ratio: 30.67 — ABNORMAL HIGH (ref 0.26–1.65)
Lambda free light chains: 6.6 mg/L (ref 5.7–26.3)

## 2022-10-08 LAB — PROTEIN ELECTROPHORESIS, SERUM
A/G Ratio: 0.9 (ref 0.7–1.7)
Albumin ELP: 3.3 g/dL (ref 2.9–4.4)
Alpha-1-Globulin: 0.3 g/dL (ref 0.0–0.4)
Alpha-2-Globulin: 0.8 g/dL (ref 0.4–1.0)
Beta Globulin: 0.8 g/dL (ref 0.7–1.3)
Gamma Globulin: 1.7 g/dL (ref 0.4–1.8)
Globulin, Total: 3.5 g/dL (ref 2.2–3.9)
M-Spike, %: 1.5 g/dL — ABNORMAL HIGH
Total Protein ELP: 6.8 g/dL (ref 6.0–8.5)

## 2022-10-09 NOTE — Progress Notes (Unsigned)
[facility] [address], [place] [address]   CLINIC:  Medical Oncology/Hematology  PCP:  Elfredia Nevins, MD [address] [place] [address] [phone]   REASON FOR VISIT:  Follow-up for MGUS   PRIOR THERAPY: None   CURRENT THERAPY: Observation  INTERVAL HISTORY:   Misty Richardson 67 y.o. female returns for routine follow-up of MGUS.  She was last seen by Rojelio Brenner, PA-C on 06/03/2022.  She denies any new bone pain or recent fractures, but has some chronic aching in her left shoulder.  She does have chronic intermittent left-sided ankle pain due to remote history of left ankle fracture, as well as intermittent back pain related to chronic T7 compression fracture.  She denies any B symptoms such as fever, chills, or night sweats.  Since her last visit, she has significantly cut back on cigarettes.  She reports that pack of cigarettes is now lasting her about a month.  She has noticed improved breathing and better appetite and has started to regain some of the weight she lost.  No new neurologic symptoms such as tinnitus, new-onset hearing loss, blurred vision, headache, or dizziness. Denies any numbness or tingling in hands or feet.  No new masses or lymphadenopathy per her report.  She denies any unusual fatigue.  She has 75% energy and 100% appetite. She endorses that she is maintaining a stable weight.  ASSESSMENT & PLAN:  1.  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 (Previous biopsy in August 2010 showed 3% plasma cells) - Skeletal survey from 07/11/2017 shows stable T7 compression deformity with no new lytic lesions - Skeletal survey (12/22/2020): Diffuse osteopenia with multiple small lucencies noted over the skull/questionable faint lucency noted over the proximal right humerus and questionable small lucency noted over the right posterior eighth rib;  myeloma or metastatic disease cannot be excluded, per radiology report. - Repeat skeletal survey (10/22/2021): No lytic or blastic lesions identified (previously identified lesions are not definitively seen on this study).  Diffuse osteopenia.  Chronic fracture of T7. - 24-hour urine/UPEP (06/07/2022): Positive for kappa type Bence-Jones protein, but negative for M spike - Myeloma panel (10/04/2022) is essentially stable. M spike 1.5 Elevated kappa light chains 202.4, normal lambda light chain 6.6, elevated ratio 30.67. Normal LDH No CRAB features (creatinine 0.82, calcium 8.7, Hgb 14.3) - She does not report any new onset bone pains, B symptoms, or recurrent infections      - PLAN: SKELETAL SURVEY due October 2024 . -- We will continue to watch closely with repeat MGUS/myeloma panel in 4 months   - Will consider bone marrow biopsy and/or PET if significant progression or development of CRAB signs/symptoms     2.  Smoking history: - She smoked 1 pack/day for the past 42 years - currently cutting back and trying to quit, smoking about a pack per month - CT lung on 11/24/2019 returned showing a Lung-RADS 2S - LDCT chest (04/12/2022): Lung RADS 2, benign appearance or behavior - PLAN: Continue annual LDCT chest, next due March 2025.     3.  Osteoporosis: - Bone density test on 08/13/2019 shows T score -3.4. - Most recent bone density/DEXA scan (08/17/2021): Osteoporosis with T-score -3.5 - She is taking Fosamax weekly since February 2016 and also continue to take calcium and vitamin D. - Last vitamin D normal at 58.79 (11/03/2021) - PLAN: Continue Fosamax, calcium, and vitamin D.  We will repeat bone density scan in July 2026. - She will have completed 10 years of treatment with Fosamax in February 2026    4.  Health maintenance: - Mammogram on 03/08/2022 was BI-RADS Category 1, negative - PLAN: Continue screening mammograms via PCP  PLAN SUMMARY: >> SKELETAL SURVEY in 1 month >> Labs in 4  months = CBC/D, CMP, SPEP, light chains, LDH >> OFFICE visit in 4 months (1 week after labs)      REVIEW OF SYSTEMS:   Review of Systems  Constitutional:  Positive for fatigue. Negative for appetite change, chills, diaphoresis, fever and unexpected weight change.  HENT:   Negative for lump/mass and nosebleeds.   Eyes:  Negative for eye problems.  Respiratory:  Positive for cough and shortness of breath (COPD). Negative for hemoptysis.   Cardiovascular:  Negative for chest pain, leg swelling and palpitations.  Gastrointestinal:  Negative for abdominal pain, blood in stool, constipation, diarrhea, nausea and vomiting.  Genitourinary:  Negative for hematuria.   Musculoskeletal:  Positive for arthralgias (Left shoulder) and back pain.  Skin: Negative.   Neurological:  Negative for dizziness, headaches and light-headedness.  Hematological:  Does not bruise/bleed easily.  Psychiatric/Behavioral:  Negative for sleep disturbance. The patient is not nervous/anxious.      PHYSICAL EXAM:  ECOG PERFORMANCE STATUS: 1 - Symptomatic but completely ambulatory  There were no vitals filed for this visit. There were no vitals filed for this visit. Physical Exam Constitutional:      Appearance: Normal appearance.  HENT:     Head: Normocephalic and atraumatic.     Mouth/Throat:     Mouth: Mucous membranes are moist.  Eyes:     Extraocular Movements: Extraocular movements intact.     Pupils: Pupils are equal, round, and reactive to light.  Cardiovascular:     Rate and Rhythm: Normal rate and regular rhythm.     Pulses: Normal pulses.     Heart sounds: Normal heart sounds.  Pulmonary:     Effort: Pulmonary effort is normal.     Comments: Decreased breath sounds in all lung fields Abdominal:     General: Bowel sounds are normal.     Palpations: Abdomen is soft.     Tenderness: There is no abdominal tenderness.  Musculoskeletal:        General: No swelling.     Right lower leg: No edema.      Left lower leg: No edema.  Lymphadenopathy:     Cervical: No cervical adenopathy.  Skin:    General: Skin is warm and dry.  Neurological:     General: No focal deficit present.     Mental Status: She is alert and oriented to person, place, and time.  Psychiatric:        Mood and Affect: Mood normal.        Behavior: Behavior normal.    PAST MEDICAL/SURGICAL HISTORY:  Past Medical History:  Diagnosis Date   Anxiety    COPD (chronic obstructive pulmonary disease) (HCC)    Gallstones    GERD (gastroesophageal reflux disease)    MGUS (monoclonal gammopathy of unknown significance)    Osteoporosis    Past Surgical History:  Procedure Laterality Date   ABDOMINAL HYSTERECTOMY     APPENDECTOMY     BONE BIOPSY Left 03/25/2014   BONE MARROW ASPIRATION Left 03/25/2014   BONE MARROW BIOPSY Left 03/19/2014   CESAREAN SECTION     x2    SOCIAL HISTORY:  Social History  Socioeconomic History   Marital status: Divorced    Spouse name: Not on file   Number of children: 2   Years of education: Not on file   Highest education level: Not on file  Occupational History   Occupation: Disabled  Tobacco Use   Smoking status: Every Day    Current packs/day: 0.00    Types: Cigarettes    Last attempt to quit: 07/11/2017    Years since quitting: 5.2   Smokeless tobacco: Former    Quit date: 07/31/2012  Vaping Use   Vaping status: Never Used  Substance and Sexual Activity   Alcohol use: Yes    Comment: occasionally   Drug use: No   Sexual activity: Not Currently  Other Topics Concern   Not on file  Social History Narrative   Not on file   Social Determinants of Health   Financial Resource Strain: Not on file  Food Insecurity: Not on file  Transportation Needs: Not on file  Physical Activity: Not on file  Stress: Not on file  Social Connections: Not on file  Intimate Partner Violence: Not on file    FAMILY HISTORY:  Family History  Problem Relation Age of Onset    Arthritis Mother    Heart failure Father    Colon cancer Maternal Grandmother    Esophageal cancer Paternal Grandfather    Stomach cancer Neg Hx    Colon polyps Neg Hx     CURRENT MEDICATIONS:  Outpatient Encounter Medications as of 10/11/2022  Medication Sig   acetaminophen (TYLENOL) 500 MG tablet Take 500 mg by mouth every 6 (six) hours as needed.   albuterol (PROVENTIL HFA;VENTOLIN HFA) 108 (90 BASE) MCG/ACT inhaler Inhale 2 puffs into the lungs every 6 (six) hours as needed for wheezing or shortness of breath. Reported on 02/06/2015   albuterol (PROVENTIL) (2.5 MG/3ML) 0.083% nebulizer solution Take 2.5 mg by nebulization every 6 (six) hours as needed for wheezing or shortness of breath.   alendronate (FOSAMAX) 70 MG tablet Take 1 tablet (70 mg total) by mouth once a week. Take with a full glass of water on an empty stomach.   ALPRAZolam (XANAX) 1 MG tablet Take 1 mg by mouth at bedtime as needed.   aspirin EC 81 MG tablet Take 1 tablet (81 mg total) by mouth daily. Swallow whole.   azithromycin (ZITHROMAX) 250 MG tablet Take (2) tablets by mouth on day 1, then take (1) tablet by mouth on days 2-5.   cholecalciferol (VITAMIN D3) 25 MCG (1000 UNIT) tablet Take 2,000 Units by mouth daily.   doxycycline (VIBRAMYCIN) 100 MG capsule Take 1 capsule (100 mg total) by mouth 2 (two) times daily.   fluticasone furoate-vilanterol (BREO ELLIPTA) 100-25 MCG/ACT AEPB Inhale 1 puff into the lungs daily.   ipratropium-albuterol (DUONEB) 0.5-2.5 (3) MG/3ML SOLN Take 3 mLs by nebulization every 4 (four) hours as needed.   methylPREDNISolone (MEDROL DOSEPAK) 4 MG TBPK tablet Taper over 6 days,   omeprazole (PRILOSEC) 40 MG capsule Take 1 capsule (40 mg total) by mouth daily.   rosuvastatin (CRESTOR) 10 MG tablet Take 1 tablet (10 mg total) by mouth daily.   No facility-administered encounter medications on file as of 10/11/2022.    ALLERGIES:  Allergies  Allergen Reactions   Avelox [Moxifloxacin Hcl In  Nacl] Rash    LABORATORY DATA:  I have reviewed the labs as listed.  CBC    Component Value Date/Time   WBC 9.8 10/04/2022 1337   RBC 4.75  10/04/2022 1337   HGB 14.3 10/04/2022 1337   HGB 14.7 07/04/2021 1529   HCT 43.4 10/04/2022 1337   HCT 43.1 07/04/2021 1529   PLT 332 10/04/2022 1337   PLT 307 07/04/2021 1529   MCV 91.4 10/04/2022 1337   MCV 91 07/04/2021 1529   MCH 30.1 10/04/2022 1337   MCHC 32.9 10/04/2022 1337   RDW 13.6 10/04/2022 1337   RDW 12.8 07/04/2021 1529   LYMPHSABS 2.7 10/04/2022 1337   LYMPHSABS 2.7 07/04/2021 1529   MONOABS 0.8 10/04/2022 1337   EOSABS 0.6 (H) 10/04/2022 1337   EOSABS 0.4 07/04/2021 1529   BASOSABS 0.0 10/04/2022 1337   BASOSABS 0.1 07/04/2021 1529      Latest Ref Rng & Units 10/04/2022    1:37 PM 08/14/2022    3:29 PM 05/27/2022    1:19 PM  CMP  Glucose 70 - 99 mg/dL 47  83  92   BUN 8 - 23 mg/dL 13  16  15    Creatinine 0.44 - 1.00 mg/dL 4.09  8.11  9.14   Sodium 135 - 145 mmol/L 135  137  132   Potassium 3.5 - 5.1 mmol/L 3.8  3.7  3.7   Chloride 98 - 111 mmol/L 102  107  102   CO2 22 - 32 mmol/L 25  23  22    Calcium 8.9 - 10.3 mg/dL 8.7  8.5  8.7   Total Protein 6.5 - 8.1 g/dL 7.2   8.1   Total Bilirubin 0.3 - 1.2 mg/dL 0.6   0.6   Alkaline Phos 38 - 126 U/L 63   61   AST 15 - 41 U/L 22   26   ALT 0 - 44 U/L 30   22     DIAGNOSTIC IMAGING:  I have independently reviewed the relevant imaging and discussed with the patient.   WRAP UP:  All questions were answered. The patient knows to call the clinic with any problems, questions or concerns.  Medical decision making: Moderate  Time spent on visit: I spent 20 minutes counseling the patient face to face. The total time spent in the appointment was 30 minutes and more than 50% was on counseling.  [name], PA-C  10/11/22 2:33 PM

## 2022-10-09 NOTE — Progress Notes (Incomplete)
[facility] [address], [place] [address]   CLINIC:  Medical Oncology/Hematology  PCP:  Elfredia Nevins, MD [address] [place] [address] [phone]   REASON FOR VISIT:  Follow-up for MGUS   PRIOR THERAPY: None   CURRENT THERAPY: Observation  INTERVAL HISTORY:   Misty Richardson 67 y.o. female returns for routine follow-up of MGUS.  She was last seen by Dr. Ellin Saba on 02/04/2022.  She denies any new bone pain or recent fractures, but has some chronic achy in her left shoulder.  She does have chronic intermittent left-sided ankle pain due to remote history of left ankle fracture, as well as intermittent back pain related to chronic T7 compression fracture.  She denies any B symptoms such as fever, chills, or night sweats.  She has been losing weight, and has lost about 5 pounds in the past 4 months - she admits that she does not always eat as much as she should, and reports that her "breakfast" is often just coffee and cigarette.   No new neurologic symptoms such as tinnitus, new-onset hearing loss, blurred vision, headache, or dizziness. Denies any numbness or tingling in hands or feet.  No new masses or lymphadenopathy per her report.   She denies any unusual fatigue.  She has 80% energy and 75% appetite. She endorses that she is maintaining a stable weight.  ASSESSMENT & PLAN:  1.  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 (Previous biopsy in August 2010 showed 3% plasma cells) - Skeletal survey from 07/11/2017 shows stable T7 compression deformity with no new lytic lesions - Skeletal survey (12/22/2020): Diffuse osteopenia with multiple small lucencies noted over the skull/questionable faint lucency noted over the proximal right humerus and questionable small lucency noted over the right posterior eighth rib; myeloma or metastatic disease cannot be excluded, per  radiology report. - Repeat skeletal survey (10/22/2021): No lytic or blastic lesions identified (previously identified lesions are not definitively seen on this study).  Diffuse osteopenia.  Chronic fracture of T7. - Myeloma panel (05/27/2022) is essentially stable. M spike 1.6 Elevated kappa light chains 233.8, normal lambda light chain 6.1, elevated ratio 38.33. Normal LDH No CRAB features (creatinine 0.73, calcium 8.7, Hgb 13.8) - She does not report any new onset bone pains, B symptoms, or recurrent infections      - PLAN: 24-hour urine with UPEP/UIFE to obtain baseline, since this has never been done -- We will continue to watch closely with repeat MGUS/myeloma panel in 4 months   - Will consider bone marrow biopsy and/or PET if significant progression or development of CRAB signs/symptoms     2.  Smoking history: - She smoked 1 pack/day for the past 42 years - currently smoking 0.75 PPD - CT lung on 11/24/2019 returned showing a Lung-RADS 2S - LDCT chest (04/12/2022): Lung RADS 2, benign appearance or behavior - PLAN: Continue annual LDCT chest, next due March 2025.     3.  Osteoporosis: - Bone density test on 08/13/2019 shows T score -3.4. - Most recent bone density/DEXA scan (08/17/2021): Osteoporosis with T-score -3.5 - She is taking Fosamax weekly since February 2016 and also continue to take calcium and vitamin D. - Last vitamin D normal at 58.79 (11/03/2021) - PLAN: Continue Fosamax, calcium, and vitamin D.  We will repeat bone density scan in July 2026. - She will have completed 10 years of treatment with Fosamax  in February 2026    4.  Health maintenance: - Mammogram on 03/08/2022 was BI-RADS Category 1, negative - PLAN: Continue screening mammograms via PCP  PLAN SUMMARY: >> 24-hour urine w/ UPEP/UIFE >> Labs in 4 months = CBC/D, CMP, SPEP, light chains, LDH >> OFFICE visit in 4 months (1 week after labs)      REVIEW OF SYSTEMS:   Review of Systems  Constitutional:   Positive for fatigue. Negative for appetite change, chills, diaphoresis, fever and unexpected weight change.  HENT:   Negative for lump/mass and nosebleeds.   Eyes:  Negative for eye problems.  Respiratory:  Positive for cough and shortness of breath (COPD). Negative for hemoptysis.   Cardiovascular:  Negative for chest pain, leg swelling and palpitations.  Gastrointestinal:  Negative for abdominal pain, blood in stool, constipation, diarrhea, nausea and vomiting.  Genitourinary:  Negative for hematuria.   Musculoskeletal:  Positive for arthralgias (Left shoulder) and back pain.  Skin: Negative.   Neurological:  Negative for dizziness, headaches and light-headedness.  Hematological:  Does not bruise/bleed easily.  Psychiatric/Behavioral:  Positive for sleep disturbance. The patient is nervous/anxious.      PHYSICAL EXAM:  ECOG PERFORMANCE STATUS: 1 - Symptomatic but completely ambulatory  There were no vitals filed for this visit. There were no vitals filed for this visit. Physical Exam Constitutional:      Appearance: Normal appearance.  HENT:     Head: Normocephalic and atraumatic.     Mouth/Throat:     Mouth: Mucous membranes are moist.  Eyes:     Extraocular Movements: Extraocular movements intact.     Pupils: Pupils are equal, round, and reactive to light.  Cardiovascular:     Rate and Rhythm: Normal rate and regular rhythm.     Pulses: Normal pulses.     Heart sounds: Normal heart sounds.  Pulmonary:     Effort: Pulmonary effort is normal.     Comments: Decreased breath sounds in all lung fields Abdominal:     General: Bowel sounds are normal.     Palpations: Abdomen is soft.     Tenderness: There is no abdominal tenderness.  Musculoskeletal:        General: No swelling.     Right lower leg: No edema.     Left lower leg: No edema.  Lymphadenopathy:     Cervical: No cervical adenopathy.  Skin:    General: Skin is warm and dry.  Neurological:     General: No focal  deficit present.     Mental Status: She is alert and oriented to person, place, and time.  Psychiatric:        Mood and Affect: Mood normal.        Behavior: Behavior normal.     PAST MEDICAL/SURGICAL HISTORY:  Past Medical History:  Diagnosis Date  . Anxiety   . COPD (chronic obstructive pulmonary disease) (HCC)   . Gallstones   . GERD (gastroesophageal reflux disease)   . MGUS (monoclonal gammopathy of unknown significance)   . Osteoporosis    Past Surgical History:  Procedure Laterality Date  . ABDOMINAL HYSTERECTOMY    . APPENDECTOMY    . BONE BIOPSY Left 03/25/2014  . BONE MARROW ASPIRATION Left 03/25/2014  . BONE MARROW BIOPSY Left 03/19/2014  . CESAREAN SECTION     x2    SOCIAL HISTORY:  Social History   Socioeconomic History  . Marital status: Divorced    Spouse name: Not on file  . Number of  children: 2  . Years of education: Not on file  . Highest education level: Not on file  Occupational History  . Occupation: Disabled  Tobacco Use  . Smoking status: Every Day    Current packs/day: 0.00    Types: Cigarettes    Last attempt to quit: 07/11/2017    Years since quitting: 5.2  . Smokeless tobacco: Former    Quit date: 07/31/2012  Vaping Use  . Vaping status: Never Used  Substance and Sexual Activity  . Alcohol use: Yes    Comment: occasionally  . Drug use: No  . Sexual activity: Not Currently  Other Topics Concern  . Not on file  Social History Narrative  . Not on file   Social Determinants of Health   Financial Resource Strain: Not on file  Food Insecurity: Not on file  Transportation Needs: Not on file  Physical Activity: Not on file  Stress: Not on file  Social Connections: Not on file  Intimate Partner Violence: Not on file    FAMILY HISTORY:  Family History  Problem Relation Age of Onset  . Arthritis Mother   . Heart failure Father   . Colon cancer Maternal Grandmother   . Esophageal cancer Paternal Grandfather   . Stomach cancer  Neg Hx   . Colon polyps Neg Hx     CURRENT MEDICATIONS:  Outpatient Encounter Medications as of 10/11/2022  Medication Sig  . acetaminophen (TYLENOL) 500 MG tablet Take 500 mg by mouth every 6 (six) hours as needed.  Marland Kitchen albuterol (PROVENTIL HFA;VENTOLIN HFA) 108 (90 BASE) MCG/ACT inhaler Inhale 2 puffs into the lungs every 6 (six) hours as needed for wheezing or shortness of breath. Reported on 02/06/2015  . albuterol (PROVENTIL) (2.5 MG/3ML) 0.083% nebulizer solution Take 2.5 mg by nebulization every 6 (six) hours as needed for wheezing or shortness of breath.  Marland Kitchen alendronate (FOSAMAX) 70 MG tablet Take 1 tablet (70 mg total) by mouth once a week. Take with a full glass of water on an empty stomach.  . ALPRAZolam (XANAX) 1 MG tablet Take 1 mg by mouth at bedtime as needed.  Marland Kitchen aspirin EC 81 MG tablet Take 1 tablet (81 mg total) by mouth daily. Swallow whole.  Marland Kitchen azithromycin (ZITHROMAX) 250 MG tablet Take (2) tablets by mouth on day 1, then take (1) tablet by mouth on days 2-5.  . cholecalciferol (VITAMIN D3) 25 MCG (1000 UNIT) tablet Take 2,000 Units by mouth daily.  Marland Kitchen doxycycline (VIBRAMYCIN) 100 MG capsule Take 1 capsule (100 mg total) by mouth 2 (two) times daily.  . fluticasone furoate-vilanterol (BREO ELLIPTA) 100-25 MCG/ACT AEPB Inhale 1 puff into the lungs daily.  Marland Kitchen ipratropium-albuterol (DUONEB) 0.5-2.5 (3) MG/3ML SOLN Take 3 mLs by nebulization every 4 (four) hours as needed.  . methylPREDNISolone (MEDROL DOSEPAK) 4 MG TBPK tablet Taper over 6 days,  . omeprazole (PRILOSEC) 40 MG capsule Take 1 capsule (40 mg total) by mouth daily.  . rosuvastatin (CRESTOR) 10 MG tablet Take 1 tablet (10 mg total) by mouth daily.   No facility-administered encounter medications on file as of 10/11/2022.    ALLERGIES:  Allergies  Allergen Reactions  . Avelox [Moxifloxacin Hcl In Nacl] Rash    LABORATORY DATA:  I have reviewed the labs as listed.  CBC    Component Value Date/Time   WBC 9.8  10/04/2022 1337   RBC 4.75 10/04/2022 1337   HGB 14.3 10/04/2022 1337   HGB 14.7 07/04/2021 1529   HCT 43.4 10/04/2022  1337   HCT 43.1 07/04/2021 1529   PLT 332 10/04/2022 1337   PLT 307 07/04/2021 1529   MCV 91.4 10/04/2022 1337   MCV 91 07/04/2021 1529   MCH 30.1 10/04/2022 1337   MCHC 32.9 10/04/2022 1337   RDW 13.6 10/04/2022 1337   RDW 12.8 07/04/2021 1529   LYMPHSABS 2.7 10/04/2022 1337   LYMPHSABS 2.7 07/04/2021 1529   MONOABS 0.8 10/04/2022 1337   EOSABS 0.6 (H) 10/04/2022 1337   EOSABS 0.4 07/04/2021 1529   BASOSABS 0.0 10/04/2022 1337   BASOSABS 0.1 07/04/2021 1529      Latest Ref Rng & Units 10/04/2022    1:37 PM 08/14/2022    3:29 PM 05/27/2022    1:19 PM  CMP  Glucose 70 - 99 mg/dL 47  83  92   BUN 8 - 23 mg/dL 13  16  15    Creatinine 0.44 - 1.00 mg/dL 5.40  9.81  1.91   Sodium 135 - 145 mmol/L 135  137  132   Potassium 3.5 - 5.1 mmol/L 3.8  3.7  3.7   Chloride 98 - 111 mmol/L 102  107  102   CO2 22 - 32 mmol/L 25  23  22    Calcium 8.9 - 10.3 mg/dL 8.7  8.5  8.7   Total Protein 6.5 - 8.1 g/dL 7.2   8.1   Total Bilirubin 0.3 - 1.2 mg/dL 0.6   0.6   Alkaline Phos 38 - 126 U/L 63   61   AST 15 - 41 U/L 22   26   ALT 0 - 44 U/L 30   22     DIAGNOSTIC IMAGING:  I have independently reviewed the relevant imaging and discussed with the patient.   WRAP UP:  All questions were answered. The patient knows to call the clinic with any problems, questions or concerns.  Medical decision making: Moderate  Time spent on visit: I spent 20 minutes counseling the patient face to face. The total time spent in the appointment was 30 minutes and more than 50% was on counseling.  [name], PA-C  10/09/22 11:53 PM

## 2022-10-11 ENCOUNTER — Inpatient Hospital Stay: Payer: Medicare HMO | Admitting: Physician Assistant

## 2022-10-11 VITALS — BP 121/70 | HR 66 | Temp 98.0°F | Resp 16 | Wt 96.6 lb

## 2022-10-11 DIAGNOSIS — M810 Age-related osteoporosis without current pathological fracture: Secondary | ICD-10-CM

## 2022-10-11 DIAGNOSIS — M8008XA Age-related osteoporosis with current pathological fracture, vertebra(e), initial encounter for fracture: Secondary | ICD-10-CM | POA: Diagnosis not present

## 2022-10-11 DIAGNOSIS — D472 Monoclonal gammopathy: Secondary | ICD-10-CM | POA: Diagnosis not present

## 2022-10-11 DIAGNOSIS — Z87891 Personal history of nicotine dependence: Secondary | ICD-10-CM | POA: Diagnosis not present

## 2022-10-11 NOTE — Patient Instructions (Addendum)
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   MGUS ("monoclonal gammopathy of undetermined significance") versus "Smoldering Myeloma" As we discussed, this is a "precancerous" condition where you have slightly increased plasma cells making a slightly increased amount of abnormal immunoglobulin proteins. Although MGUS is not causing any current problems in your body, it has a 1% each year risk of progression to multiple myeloma cancer. At this time, your blood work does not show any sign of having progressed to multiple myeloma cancer.  Your blood work is stable.  However, you most likely fall in the "gray area" between MGUS and multiple myeloma, which is referred to as "smoldering myeloma." We will continue to monitor your labs every 4 months and check whole-body x-rays once a year (next due October 2024)  Please make sure you stop at radiology department at [facility] in October 2024 to have your whole-body x-rays done.  Continue to take Fosamax once a week for your osteoporosis.  FOLLOW-UP APPOINTMENT: Office visit in 4 months, after labs   ** Thank you for trusting me with your healthcare!  I strive to provide all of my patients with quality care at each visit.  If you receive a survey for this visit, I would be so grateful to you for taking the time to provide feedback.  Thank you in advance!  ~ Truth Wolaver                   Dr. Doreatha Massed   &   Rojelio Brenner, PA-C   - - - - - - - - - - - - - - - - - -   Thank you for choosing  Cancer Center at [facility] to provide your oncology and hematology care.  To afford each patient quality time with our provider, please arrive at least 15 minutes before your scheduled appointment time.   If you have a lab appointment with the Cancer Center please come in thru  the Main Entrance and check in at the main information desk.  You need to re-schedule your appointment should you arrive 10 or more minutes late.  We strive to give you quality time with our providers, and arriving late affects you and other patients whose appointments are after yours.  Also, if you no show three or more times for appointments you may be dismissed from the clinic at the providers discretion.     Again, thank you for choosing [facility].  Our hope is that these requests will decrease the amount of time that you wait before being seen by our physicians.       _____________________________________________________________  Should you have questions after your visit to [facility], please contact our office at [phone] and follow the prompts.  Our office hours are 8:00 a.m. and 4:30 p.m. Monday - Friday.  Please note that voicemails left after 4:00 p.m. may not be returned until the following business day.  We are closed weekends and major holidays.  You do have access to a nurse 24-7, just call the main number to the clinic [phone] and do not press any options, hold on the line and a nurse will answer the phone.    For prescription refill requests, have your pharmacy contact our office and allow 72 hours.    Due to Covid, you will need  to wear a mask upon entering the hospital. If you do not have a mask, a mask will be given to you at the Main Entrance upon arrival. For doctor visits, patients may have 1 support person age 25 or older with them. For treatment visits, patients can not have anyone with them due to social distancing guidelines and our immunocompromised population.

## 2022-11-02 ENCOUNTER — Ambulatory Visit: Payer: Medicare HMO | Admitting: Internal Medicine

## 2022-11-02 VITALS — BP 104/70 | HR 100 | Ht 60.0 in | Wt 97.0 lb

## 2022-11-02 DIAGNOSIS — R140 Abdominal distension (gaseous): Secondary | ICD-10-CM

## 2022-11-02 DIAGNOSIS — K219 Gastro-esophageal reflux disease without esophagitis: Secondary | ICD-10-CM

## 2022-11-02 DIAGNOSIS — R1013 Epigastric pain: Secondary | ICD-10-CM | POA: Diagnosis not present

## 2022-11-02 MED ORDER — OMEPRAZOLE 40 MG PO CPDR: 40.0000 mg | DELAYED_RELEASE_CAPSULE | Freq: Every day | ORAL | 2 refills | Status: DC

## 2022-11-02 NOTE — Progress Notes (Unsigned)
Chief Complaint: Bloating, abdominal discomfort  HPI : 67 year old female with history of COPD, gallstones, MGUS, osteoporosis, anxiety presents for follow up of bloating and abdominal discomfort  Interval History: She is having bloating issues still. The first half of the day she is okay but he last half of the day she starts to feel bloated. She will take the omeprazole 30 min before she eats, but seh is thinking abou ttaking it an hour before her symptoms. She has gained a few lbs. She has been trying to cut down on cigarettes. She is staying away from spicy foods. She has not taken her Miralax. She will drink a cup of coffee and it will not be long before she has to go to the bathroom. She pretty much eats what she wants.    She has GERD at times. She is still on omeprazole 40 mg BID. In the mornings, she feels good. Then the second half of the day, then she starts to get bloated. This bloating can occur before she eats or takes the second dose of her PPI. She has one BM every other day. The bloating does not make her feel good. She is still uncomfortable wearing her bra due to tightness. She has cut down on soft drinks and is now just drinking tea. She is losing some weight because she doesn't eat as much when she is bloated. Endorses some SOB due to the humidity and her known COPD.  Wt Readings from Last 3 Encounters:  11/02/22 97 lb (44 kg)  10/11/22 96 lb 9.6 oz (43.8 kg)  08/14/22 91 lb 14.9 oz (41.7 kg)   Current Outpatient Medications  Medication Sig Dispense Refill   acetaminophen (TYLENOL) 500 MG tablet Take 500 mg by mouth every 6 (six) hours as needed.     albuterol (PROVENTIL HFA;VENTOLIN HFA) 108 (90 BASE) MCG/ACT inhaler Inhale 2 puffs into the lungs every 6 (six) hours as needed for wheezing or shortness of breath. Reported on 02/06/2015     albuterol (PROVENTIL) (2.5 MG/3ML) 0.083% nebulizer solution Take 2.5 mg by nebulization every 6 (six) hours as needed for wheezing or  shortness of breath.     alendronate (FOSAMAX) 70 MG tablet Take 1 tablet (70 mg total) by mouth once a week. Take with a full glass of water on an empty stomach. 12 tablet 3   ALPRAZolam (XANAX) 1 MG tablet Take 1 mg by mouth at bedtime as needed.     aspirin EC 81 MG tablet Take 1 tablet (81 mg total) by mouth daily. Swallow whole. 90 tablet 3   cholecalciferol (VITAMIN D3) 25 MCG (1000 UNIT) tablet Take 2,000 Units by mouth daily.     fluticasone furoate-vilanterol (BREO ELLIPTA) 100-25 MCG/ACT AEPB Inhale 1 puff into the lungs daily.     ipratropium-albuterol (DUONEB) 0.5-2.5 (3) MG/3ML SOLN Take 3 mLs by nebulization every 4 (four) hours as needed. 360 mL 0   omeprazole (PRILOSEC) 40 MG capsule Take 1 capsule (40 mg total) by mouth daily. 60 capsule 2   rosuvastatin (CRESTOR) 10 MG tablet Take 1 tablet (10 mg total) by mouth daily. 90 tablet 3   azithromycin (ZITHROMAX) 250 MG tablet Take (2) tablets by mouth on day 1, then take (1) tablet by mouth on days 2-5. (Patient not taking: Reported on 11/02/2022) 6 tablet 0   doxycycline (VIBRAMYCIN) 100 MG capsule Take 1 capsule (100 mg total) by mouth 2 (two) times daily. (Patient not taking: Reported on 11/02/2022) 20  capsule 0   methylPREDNISolone (MEDROL DOSEPAK) 4 MG TBPK tablet Taper over 6 days, (Patient not taking: Reported on 11/02/2022) 21 tablet 0   No current facility-administered medications for this visit.   Allergies  Allergen Reactions   Avelox [Moxifloxacin Hcl In Nacl] Rash   Review of Systems: All systems reviewed and negative except where noted in HPI.   Physical Exam: BP 104/70   Pulse 100   Ht 5' (1.524 m)   Wt 97 lb (44 kg)   BMI 18.94 kg/m  Constitutional: Pleasant,well-developed, female in no acute distress. HEENT: Normocephalic and atraumatic. Conjunctivae are normal. No scleral icterus. Cardiovascular: Normal rate, regular rhythm.  Pulmonary/chest: Scattered wheezing in the left lung Abdominal: Soft,  nondistended, tender in scattered areas around the abdomen. Bowel sounds active throughout. There are no masses palpable. No hepatomegaly. Extremities: No edema Neurological: Alert and oriented to person place and time. Skin: Skin is warm and dry. No rashes noted. Psychiatric: Normal mood and affect. Behavior is normal.  Labs 03/2021: CMP unremarkable.  Labs 06/2021: CBC unremarkable.  Labs 07/2021: TSH nml.   Labs 10/2021: CMP and CBC unremarkable.  Ab U/S 06/16/21: IMPRESSION: Cholelithiasis without secondary signs of acute cholecystitis. Possible 4 mm left renal stone.  HIDA scan 08/03/21: FINDINGS: Prompt uptake and biliary excretion of activity by the liver is seen. Gallbladder activity is visualized, consistent with patency of cystic duct. Biliary activity passes into small bowel, consistent with patent common bile duct. Calculated gallbladder ejection fraction is 84%. (Normal gallbladder ejection fraction with Ensure is greater than 33%.) IMPRESSION: 1.  Patent cystic and common bile ducts. 2.  Normal gallbladder ejection fraction.  EGD 09/16/21: - White nummular lesions in esophageal mucosa. Biopsied. - Nodular mucosa in the esophagus. Biopsied. - Gastritis. Biopsied. - Duodenitis. Biopsied. Path: 1. Surgical [P], duodenal - DUODENAL MUCOSA WITH NO SPECIFIC HISTOPATHOLOGIC CHANGES - NEGATIVE FOR INCREASED INTRAEPITHELIAL LYMPHOCYTES OR VILLOUS ARCHITECTURAL CHANGES 2. Surgical [P], gastric - GASTRIC ANTRAL AND OXYNTIC MUCOSA WITH NO SPECIFIC HISTOPATHOLOGIC CHANGES - HELICOBACTER PYLORI-LIKE ORGANISMS ARE NOT IDENTIFIED ON ROUTINE H&E STAIN 3. Surgical [P], nodular GE junction mucosa - ESOPHAGEAL SQUAMOUS AND CARDIAC MUCOSA WITH REFLUX-ASSOCIATED CHANGES - NEGATIVE FOR INTESTINAL METAPLASIA OR DYSPLASIA 4. Surgical [P], esophagus - ESOPHAGEAL SQUAMOUS MUCOSA WITH MILD VASCULAR CONGESTION, AND FOCAL SQUAMOUS BALLOONING, SUGGESTIVE OF REFLUX ESOPHAGITIS - NEGATIVE FOR  INCREASED INTRAEPITHELIAL EOSINOPHILS  Colonoscopy 09/16/21: - The examined portion of the ileum was normal. - One 3 mm polyp in the cecum, removed with a cold snare. Resected and retrieved. - Two 3 to 5 mm polyps in the sigmoid colon, removed with a cold snare. Resected and retrieved. - Diverticulosis in the sigmoid colon. - Non-bleeding internal hemorrhoids. Path: 5. Surgical [P], colon, cecum, polyp (1) - TUBULAR ADENOMA WITHOUT HIGH-GRADE DYSPLASIA OR MALIGNANCY 6. Surgical [P], colon, sigmoid, polyp (2) - TUBULAR ADENOMA WITHOUT HIGH-GRADE DYSPLASIA OR MALIGNANCY - HYPERPLASTIC POLYP(S)  ASSESSMENT AND PLAN: Dyspepsia Bloating GERD Constipation Weight loss - improved History of colon polyps Gallstones Patient presents with bloating that worsens throughout the day.  I did tell the patient that some degree of bloating is physiologic, but will try some therapies to see if this helps with her bloating.  Patient does describe some underlying constipation so we will start her on daily MiraLAX.  I also encouraged her to drink enough water since her recent labs have suggested that she is dehydrated.  I encouraged her to follow the low FODMAP diet to see if this helps with her  bloating as well.  Patient has dropped a few pounds since her last visit so I instructed her to use nutritional supplements to prevent weight loss.  Will attempt to decrease her omeprazole from twice daily to daily in order to get her on the lowest dose that is still effective for keeping her symptoms under control. - Has had difficulty with following low FODMAP diet in the past - Encourage drinking 8 cups of water per day - Continue omeprazole 40 mg every day before dinner. Refill - SIBO breath test - Trial of stool softener to see if this helps with her bloating - Okay to use Tums PRN for breakthrough symptoms - Congratulated on reducing her tobacco use - Next colonoscopy in 08/2028 for polyp surveillance - RTC 6  months  Eulah Pont, MD  I spent 40 minutes of time, including in depth chart review, independent review of results as outlined above, communicating results with the patient directly, face-to-face time with the patient, coordinating care, ordering studies and medications as appropriate, and documentation.

## 2022-11-02 NOTE — Patient Instructions (Addendum)
Start taking Colace 100 to 200 mg daily   You have been given a testing kit to check for small intestine bacterial overgrowth (SIBO) which is completed by a company named [employer]. Make sure to return your test in the mail using the return mailing label given to you along with the kit. The test order, your demographic and insurance information have all already been sent to the company. [facility] will collect an upfront charge of $99.74 for commercial insurance plans and $209.74 if you are paying cash. Make sure to discuss with [facility] PRIOR to having the test to see if they have gotten information from your insurance company as to how much your testing will cost out of pocket, if any. Please contact [facility] at phone number [phone] to get instructions regarding how to perform the test as our office is unable to give specific testing instructions.  We have sent the following medications to your pharmacy for you to pick up at your convenience: Omeprazole  Follow up in 6 months  If your blood pressure at your visit was 140/90 or greater, please contact your primary care physician to follow up on this.  _______________________________________________________  If you are age 6 or older, your body mass index should be between 23-30. Your Body mass index is 18.94 kg/m. If this is out of the aforementioned range listed, please consider follow up with your Primary Care Provider.  If you are age 108 or younger, your body mass index should be between 19-25. Your Body mass index is 18.94 kg/m. If this is out of the aformentioned range listed, please consider follow up with your Primary Care Provider.   ________________________________________________________  The [facility] providers would like to encourage you to use [facility] to communicate with providers for non-urgent requests or questions.  Due to long hold times on the telephone, sending your provider a message by  [facility] may be a faster and more efficient way to get a response.  Please allow 48 business hours for a response.  Please remember that this is for non-urgent requests.  _______________________________________________________  Thank you for entrusting me with your care and for choosing [facility], Dr. Eulah Pont

## 2022-11-08 ENCOUNTER — Ambulatory Visit (HOSPITAL_COMMUNITY)
Admission: RE | Admit: 2022-11-08 | Discharge: 2022-11-08 | Disposition: A | Discharge status: Home / Self Care | Payer: Medicare HMO | Source: Ambulatory Visit | Attending: Physician Assistant | Admitting: Physician Assistant

## 2022-11-08 DIAGNOSIS — D472 Monoclonal gammopathy: Secondary | ICD-10-CM | POA: Insufficient documentation

## 2022-11-08 DIAGNOSIS — I700 Atherosclerosis of aorta: Secondary | ICD-10-CM | POA: Diagnosis not present

## 2022-11-08 DIAGNOSIS — M8588 Other specified disorders of bone density and structure, other site: Secondary | ICD-10-CM | POA: Diagnosis not present

## 2022-11-21 ENCOUNTER — Other Ambulatory Visit: Payer: Self-pay

## 2022-11-21 ENCOUNTER — Observation Stay (HOSPITAL_COMMUNITY)
Admission: EM | Admit: 2022-11-21 | Discharge: 2022-11-23 | Disposition: A | Discharge status: Home / Self Care | Payer: Medicare HMO | Attending: Student | Admitting: Student

## 2022-11-21 ENCOUNTER — Emergency Department (HOSPITAL_COMMUNITY): Payer: Medicare HMO

## 2022-11-21 ENCOUNTER — Encounter (HOSPITAL_COMMUNITY): Payer: Self-pay

## 2022-11-21 DIAGNOSIS — Z79899 Other long term (current) drug therapy: Secondary | ICD-10-CM | POA: Insufficient documentation

## 2022-11-21 DIAGNOSIS — E782 Mixed hyperlipidemia: Secondary | ICD-10-CM | POA: Diagnosis not present

## 2022-11-21 DIAGNOSIS — Z1152 Encounter for screening for COVID-19: Secondary | ICD-10-CM | POA: Insufficient documentation

## 2022-11-21 DIAGNOSIS — Z7982 Long term (current) use of aspirin: Secondary | ICD-10-CM | POA: Insufficient documentation

## 2022-11-21 DIAGNOSIS — Z743 Need for continuous supervision: Secondary | ICD-10-CM | POA: Diagnosis not present

## 2022-11-21 DIAGNOSIS — F17210 Nicotine dependence, cigarettes, uncomplicated: Secondary | ICD-10-CM | POA: Diagnosis not present

## 2022-11-21 DIAGNOSIS — R000 Tachycardia, unspecified: Secondary | ICD-10-CM | POA: Diagnosis not present

## 2022-11-21 DIAGNOSIS — R0902 Hypoxemia: Secondary | ICD-10-CM | POA: Diagnosis not present

## 2022-11-21 DIAGNOSIS — D72829 Elevated white blood cell count, unspecified: Secondary | ICD-10-CM | POA: Diagnosis not present

## 2022-11-21 DIAGNOSIS — R0602 Shortness of breath: Secondary | ICD-10-CM | POA: Diagnosis not present

## 2022-11-21 DIAGNOSIS — J441 Chronic obstructive pulmonary disease with (acute) exacerbation: Secondary | ICD-10-CM | POA: Diagnosis not present

## 2022-11-21 DIAGNOSIS — K219 Gastro-esophageal reflux disease without esophagitis: Secondary | ICD-10-CM | POA: Diagnosis not present

## 2022-11-21 DIAGNOSIS — I10 Essential (primary) hypertension: Secondary | ICD-10-CM | POA: Diagnosis not present

## 2022-11-21 DIAGNOSIS — R062 Wheezing: Secondary | ICD-10-CM | POA: Diagnosis not present

## 2022-11-21 LAB — CBC
HCT: 46.6 % — ABNORMAL HIGH (ref 36.0–46.0)
Hemoglobin: 15.2 g/dL — ABNORMAL HIGH (ref 12.0–15.0)
MCH: 30.1 pg (ref 26.0–34.0)
MCHC: 32.6 g/dL (ref 30.0–36.0)
MCV: 92.3 fL (ref 80.0–100.0)
Platelets: 387 10*3/uL (ref 150–400)
RBC: 5.05 MIL/uL (ref 3.87–5.11)
RDW: 13.3 % (ref 11.5–15.5)
WBC: 13.7 10*3/uL — ABNORMAL HIGH (ref 4.0–10.5)
nRBC: 0 % (ref 0.0–0.2)

## 2022-11-21 LAB — BLOOD GAS, VENOUS
Acid-Base Excess: 1.9 mmol/L (ref 0.0–2.0)
Bicarbonate: 28.7 mmol/L — ABNORMAL HIGH (ref 20.0–28.0)
Drawn by: 34092
O2 Saturation: 91.8 %
Patient temperature: 36.6
pCO2, Ven: 51 mm[Hg] (ref 44–60)
pH, Ven: 7.36 (ref 7.25–7.43)
pO2, Ven: 59 mm[Hg] — ABNORMAL HIGH (ref 32–45)

## 2022-11-21 LAB — RESP PANEL BY RT-PCR (RSV, FLU A&B, COVID)  RVPGX2
Influenza A by PCR: NEGATIVE
Influenza B by PCR: NEGATIVE
Resp Syncytial Virus by PCR: NEGATIVE
SARS Coronavirus 2 by RT PCR: NEGATIVE

## 2022-11-21 LAB — BASIC METABOLIC PANEL
Anion gap: 9 (ref 5–15)
BUN: 15 mg/dL (ref 8–23)
CO2: 23 mmol/L (ref 22–32)
Calcium: 8.7 mg/dL — ABNORMAL LOW (ref 8.9–10.3)
Chloride: 104 mmol/L (ref 98–111)
Creatinine, Ser: 0.9 mg/dL (ref 0.44–1.00)
GFR, Estimated: >60 mL/min (ref >60)
Glucose, Bld: 105 mg/dL — ABNORMAL HIGH (ref 70–99)
Potassium: 4.2 mmol/L (ref 3.5–5.1)
Sodium: 136 mmol/L (ref 135–145)

## 2022-11-21 MED ORDER — AZITHROMYCIN 250 MG PO TABS
250.0000 mg | ORAL_TABLET | Freq: Every day | ORAL | Status: DC
  Administered 2022-11-22 – 2022-11-23 (×2): 250 mg via ORAL
  Filled 2022-11-21 (×2): qty 1

## 2022-11-21 MED ORDER — ONDANSETRON HCL 4 MG PO TABS
4.0000 mg | ORAL_TABLET | Freq: Four times a day (QID) | ORAL | Status: DC | PRN
  Administered 2022-11-22: 4 mg via ORAL
  Filled 2022-11-21: qty 1

## 2022-11-21 MED ORDER — FLUTICASONE FUROATE-VILANTEROL 100-25 MCG/ACT IN AEPB
1.0000 | INHALATION_SPRAY | Freq: Every day | RESPIRATORY_TRACT | Status: DC
  Administered 2022-11-22 – 2022-11-23 (×2): 1 via RESPIRATORY_TRACT
  Filled 2022-11-21: qty 28

## 2022-11-21 MED ORDER — SODIUM CHLORIDE 0.9 % IV SOLN
1.0000 g | Freq: Once | INTRAVENOUS | Status: AC
  Administered 2022-11-21: 1 g via INTRAVENOUS
  Filled 2022-11-21: qty 10

## 2022-11-21 MED ORDER — ACETAMINOPHEN 650 MG RE SUPP: 650.0000 mg | Freq: Four times a day (QID) | RECTAL | Status: DC | PRN

## 2022-11-21 MED ORDER — ROSUVASTATIN CALCIUM 10 MG PO TABS
10.0000 mg | ORAL_TABLET | Freq: Every day | ORAL | Status: DC
  Administered 2022-11-21 – 2022-11-23 (×3): 10 mg via ORAL
  Filled 2022-11-21 (×3): qty 1

## 2022-11-21 MED ORDER — ONDANSETRON HCL 4 MG/2ML IJ SOLN
4.0000 mg | Freq: Four times a day (QID) | INTRAMUSCULAR | Status: DC | PRN
  Administered 2022-11-21: 4 mg via INTRAVENOUS
  Filled 2022-11-21: qty 2

## 2022-11-21 MED ORDER — ALBUTEROL SULFATE (2.5 MG/3ML) 0.083% IN NEBU
INHALATION_SOLUTION | RESPIRATORY_TRACT | Status: AC
  Administered 2022-11-21: 10 mg
  Filled 2022-11-21: qty 12

## 2022-11-21 MED ORDER — METHYLPREDNISOLONE SODIUM SUCC 40 MG IJ SOLR
40.0000 mg | Freq: Two times a day (BID) | INTRAMUSCULAR | Status: DC
  Administered 2022-11-22 – 2022-11-23 (×3): 40 mg via INTRAVENOUS
  Filled 2022-11-21 (×3): qty 1

## 2022-11-21 MED ORDER — ACETAMINOPHEN 325 MG PO TABS
650.0000 mg | ORAL_TABLET | Freq: Four times a day (QID) | ORAL | Status: DC | PRN
  Administered 2022-11-21 – 2022-11-22 (×3): 650 mg via ORAL
  Filled 2022-11-21 (×3): qty 2

## 2022-11-21 MED ORDER — ALBUTEROL (5 MG/ML) CONTINUOUS INHALATION SOLN
10.0000 mg/h | INHALATION_SOLUTION | Freq: Once | RESPIRATORY_TRACT | Status: DC
  Filled 2022-11-21: qty 20

## 2022-11-21 MED ORDER — ENOXAPARIN SODIUM 30 MG/0.3ML IJ SOSY
30.0000 mg | PREFILLED_SYRINGE | INTRAMUSCULAR | Status: DC
  Administered 2022-11-21 – 2022-11-22 (×2): 30 mg via SUBCUTANEOUS
  Filled 2022-11-21 (×2): qty 0.3

## 2022-11-21 MED ORDER — DEXTROSE 5 % IV SOLN
500.0000 mg | Freq: Once | INTRAVENOUS | Status: AC
  Administered 2022-11-21: 500 mg via INTRAVENOUS
  Filled 2022-11-21: qty 5

## 2022-11-21 MED ORDER — PANTOPRAZOLE SODIUM 40 MG PO TBEC
40.0000 mg | DELAYED_RELEASE_TABLET | Freq: Every day | ORAL | Status: DC
  Administered 2022-11-21 – 2022-11-23 (×3): 40 mg via ORAL
  Filled 2022-11-21 (×3): qty 1

## 2022-11-21 MED ORDER — ENOXAPARIN SODIUM 40 MG/0.4ML IJ SOSY: 40.0000 mg | PREFILLED_SYRINGE | INTRAMUSCULAR | Status: DC

## 2022-11-21 MED ORDER — DM-GUAIFENESIN ER 30-600 MG PO TB12
1.0000 | ORAL_TABLET | Freq: Two times a day (BID) | ORAL | Status: DC
  Administered 2022-11-21 – 2022-11-23 (×4): 1 via ORAL
  Filled 2022-11-21 (×4): qty 1

## 2022-11-21 MED ORDER — LEVALBUTEROL HCL 0.63 MG/3ML IN NEBU
0.6300 mg | INHALATION_SOLUTION | Freq: Four times a day (QID) | RESPIRATORY_TRACT | Status: DC
  Administered 2022-11-21 – 2022-11-22 (×4): 0.63 mg via RESPIRATORY_TRACT
  Filled 2022-11-21 (×4): qty 3

## 2022-11-21 NOTE — ED Notes (Signed)
Hospitalist at bedside 

## 2022-11-21 NOTE — H&P (Signed)
History and Physical    Patient: Misty Richardson ZOX:[id number] DOB: 1955-02-02 DOA: 11/21/2022 DOS: the patient was seen and examined on 11/21/2022 PCP: Elfredia Nevins, MD  Patient coming from: Home  Chief Complaint:  Chief Complaint  Patient presents with   Shortness of Breath   HPI: Misty Richardson is a 67 y.o. female with medical history significant of COPD, GERD, anxiety, hyperlipidemia who presents emergency department due to 2-day onset of shortness of breath, she has been using breathing treatment at home without much relief, symptoms worsened this afternoon and she activated EMS.  On arrival of EMS team, albuterol, Atrovent, 25 mg of Solu-Medrol was given prior to arrival.  Patient has increased work of breathing, she was tripoding and speaking in full sentences on arrival to the ED.  ED Course:  In the emergency department, she was tachypneic, tachycardic, O2 sat was 99% on room air and other vital signs are within normal range.  Workup in the ED showed leukocytosis, H/H was 15.2/46.6.  BMP was normal except for blood glucose of 105.  Influenza A, B, SARS coronavirus 2, RSV was negative. Chest x-ray showed hyperinflation with chronic changes. Patient was placed on BiPAP and was treated with ceftriaxone, azithromycin and breathing treatment with albuterol was provided.  Review of Systems: Review of systems as noted in the HPI. All other systems reviewed and are negative.   Past Medical History:  Diagnosis Date   Anxiety    COPD (chronic obstructive pulmonary disease) (HCC)    Gallstones    GERD (gastroesophageal reflux disease)    MGUS (monoclonal gammopathy of unknown significance)    Osteoporosis    Past Surgical History:  Procedure Laterality Date   ABDOMINAL HYSTERECTOMY     APPENDECTOMY     BONE BIOPSY Left 03/25/2014   BONE MARROW ASPIRATION Left 03/25/2014   BONE MARROW BIOPSY Left 03/19/2014   CESAREAN SECTION     x2    Social History:  reports that she  has been smoking cigarettes. She quit smokeless tobacco use about 10 years ago. She reports current alcohol use. She reports that she does not use drugs.   Allergies  Allergen Reactions   Avelox [Moxifloxacin Hcl In Nacl] Rash    Family History  Problem Relation Age of Onset   Arthritis Mother    Heart failure Father    Colon cancer Maternal Grandmother    Esophageal cancer Paternal Grandfather    Stomach cancer Neg Hx    Colon polyps Neg Hx      Prior to Admission medications   Medication Sig Start Date End Date Taking? Authorizing Provider  acetaminophen (TYLENOL) 500 MG tablet Take 500 mg by mouth every 6 (six) hours as needed.   Yes [provider]  albuterol (PROVENTIL HFA;VENTOLIN HFA) 108 (90 BASE) MCG/ACT inhaler Inhale 2 puffs into the lungs every 6 (six) hours as needed for wheezing or shortness of breath. Reported on 02/06/2015   Yes [provider]  albuterol (PROVENTIL) (2.5 MG/3ML) 0.083% nebulizer solution Take 2.5 mg by nebulization every 6 (six) hours as needed for wheezing or shortness of breath.   Yes [provider]  alendronate (FOSAMAX) 70 MG tablet Take 1 tablet (70 mg total) by mouth once a week. Take with a full glass of water on an empty stomach. 02/04/22  Yes Pennington, Rebekah M, PA-C  ALPRAZolam Prudy Feeler) 1 MG tablet Take 1 mg by mouth at bedtime as needed for sleep. 12/02/16  Yes [provider]  aspirin EC 81 MG tablet Take 1 tablet (81 mg total) by mouth daily. Swallow whole. 03/27/20  Yes Jonelle Sidle, MD  cholecalciferol (VITAMIN D3) 25 MCG (1000 UNIT) tablet Take 2,000 Units by mouth daily.   Yes [provider]  fluticasone furoate-vilanterol (BREO ELLIPTA) 100-25 MCG/ACT AEPB Inhale 1 puff into the lungs daily.   Yes [provider]  ipratropium-albuterol (DUONEB) 0.5-2.5 (3) MG/3ML SOLN Take 3 mLs by nebulization every 4 (four) hours as needed. 09/17/21  Yes Particia Nearing, PA-C   methylPREDNISolone (MEDROL DOSEPAK) 4 MG TBPK tablet Taper over 6 days, 08/14/22  Yes Eber Hong, MD  omeprazole (PRILOSEC) 40 MG capsule Take 1 capsule (40 mg total) by mouth daily. 11/02/22  Yes Imogene Burn, MD  rosuvastatin (CRESTOR) 10 MG tablet Take 1 tablet (10 mg total) by mouth daily. 08/06/22 11/21/22 Yes Jonelle Sidle, MD    Physical Exam: BP 116/71   Pulse (!) 120   Temp 97.9 F (36.6 C) (Oral)   Resp 20   Ht 5' (1.524 m)   Wt 44 kg   SpO2 91%   BMI 18.94 kg/m   General: 67 y.o. year-old female well developed well nourished in no acute distress.  Alert and oriented x3. HEENT: NCAT, EOMI Neck: Supple, trachea medial Cardiovascular: Tachycardia.  Regular rate and rhythm with no rubs or gallops.  No thyromegaly or JVD noted.  No lower extremity edema. 2/4 pulses in all 4 extremities. Respiratory: Diffuse wheezes on auscultation.  No rales.  Abdomen: Soft, nontender nondistended with normal bowel sounds x4 quadrants. Muskuloskeletal: No cyanosis, clubbing or edema noted bilaterally Neuro: CN II-XII intact, strength 5/5 x 4, sensation, reflexes intact Skin: No ulcerative lesions noted or rashes Psychiatry: Judgement and insight appear normal. Mood is appropriate for condition and setting          Labs on Admission:  Basic Metabolic Panel: Recent Labs  Lab 11/21/22 1728  NA 136  K 4.2  CL 104  CO2 23  GLUCOSE 105*  BUN 15  CREATININE 0.90  CALCIUM 8.7*   Liver Function Tests: No results for input(s): "AST", "ALT", "ALKPHOS", "BILITOT", "PROT", "ALBUMIN" in the last 168 hours. No results for input(s): "LIPASE", "AMYLASE" in the last 168 hours. No results for input(s): "AMMONIA" in the last 168 hours. CBC: Recent Labs  Lab 11/21/22 1728  WBC 13.7*  HGB 15.2*  HCT 46.6*  MCV 92.3  PLT 387   Cardiac Enzymes: No results for input(s): "CKTOTAL", "CKMB", "CKMBINDEX", "TROPONINI" in the last 168 hours.  BNP (last 3 results) No results for input(s):  "BNP" in the last 8760 hours.  ProBNP (last 3 results) No results for input(s): "PROBNP" in the last 8760 hours.  CBG: No results for input(s): "GLUCAP" in the last 168 hours.  Radiological Exams on Admission: DG Chest Portable 1 View  Result Date: 11/21/2022 CLINICAL DATA:  COPD exacerbation.  Shortness of breath EXAM: PORTABLE CHEST 1 VIEW COMPARISON:  X-ray 08/14/2022 FINDINGS: Hyperinflation. No consolidation, pneumothorax or effusion. No edema. Normal cardiopericardial silhouette. Overlapping cardiac leads. Nodular density along the lower left thorax could be nipple shadow. Would recommend follow up x-ray with nipple markers when appropriate. IMPRESSION: Hyperinflation with chronic changes. Possible nipple shadow along the left lung base but asymmetric from the right side. Recommend follow up x-ray with nipple marker when appropriate Electronically Signed   By: Karen Kays M.D.   On: 11/21/2022 17:53    EKG: I independently viewed the EKG done  and my findings are as followed: Sinus tachycardia at a rate of 125 bpm  Assessment/Plan Present on Admission:  Acute exacerbation of chronic obstructive pulmonary disease (COPD) (HCC)  Leukocytosis  Principal Problem:   Acute exacerbation of chronic obstructive pulmonary disease (COPD) (HCC) Active Problems:   Leukocytosis   GERD without esophagitis   Mixed hyperlipidemia  Acute exacerbation of COPD Continue Xopenex, Breo Ellipta, Mucinex, Solu-Medrol, azithromycin. Continue Protonix to prevent steroid-induced ulcer Continue incentive spirometry and flutter valve  Leukocytosis possibly reactive WBC 13.7, no obvious clinical sign of acute infectious process at this time Continue to monitor WBC with morning labs  GERD Continue Protonix  Mixed hyperlipidemia Continue Crestor  Tobacco use disorder Patient states that she only smokes occasionally and that 1 cigarette may last up to 1 week She was counseled on tobacco use  cessation  DVT prophylaxis: Lovenox   Advance Care Planning: CODE STATUS: Full code  Consults: None  Family Communication: None at bedside  Severity of Illness: The appropriate patient status for this patient is OBSERVATION. Observation status is judged to be reasonable and necessary in order to provide the required intensity of service to ensure the patient's safety. The patient's presenting symptoms, physical exam findings, and initial radiographic and laboratory data in the context of their medical condition is felt to place them at decreased risk for further clinical deterioration. Furthermore, it is anticipated that the patient will be medically stable for discharge from the hospital within 2 midnights of admission.   Author: Frankey Shown, DO 11/21/2022 8:06 PM  For on call review [email or web address].

## 2022-11-21 NOTE — ED Triage Notes (Signed)
BIB EMS SOB. Given 7.5 Albuterol, 1mg  atrovent 125mg  solumedrol

## 2022-11-21 NOTE — Progress Notes (Signed)
PHARMACIST - PHYSICIAN COMMUNICATION  CONCERNING:  Enoxaparin (Lovenox) for DVT Prophylaxis    RECOMMENDATION: Patient was prescribed enoxaprin 40mg  q24 hours for VTE prophylaxis.   Filed Weights   11/21/22 1723  Weight: 44 kg (96 lb 15.7 oz)    Body mass index is 18.94 kg/m.  Estimated Creatinine Clearance: 42.7 mL/min (by C-G formula based on SCr of 0.9 mg/dL).  Patient is candidate for enoxaparin 30mg  every 24 hours based on CrCl <94ml/min or Weight <45kg  DESCRIPTION: Pharmacy has adjusted enoxaparin dose per [facility] policy.  Patient is now receiving enoxaparin 30 mg every 24 hours    Foye Deer, PharmD Clinical Pharmacist  11/21/2022 8:05 PM

## 2022-11-21 NOTE — ED Provider Notes (Addendum)
[name] EMERGENCY DEPARTMENT AT [facility] Provider Note   CSN: [id number] Arrival date & time: 11/21/22  1712     History  Chief Complaint  Patient presents with   Shortness of Breath    Misty Richardson is a 67 y.o. female.  45-year-old female history of COPD not on oxygen at home presenting emergency department for shortness of breath.  Worsening symptoms since this past Friday.  Been trying breathing treatments at home no improvement.  Symptoms seemingly worsened this afternoon.   Shortness of Breath      Home Medications Prior to Admission medications   Medication Sig Start Date End Date Taking? Authorizing Provider  acetaminophen (TYLENOL) 500 MG tablet Take 500 mg by mouth every 6 (six) hours as needed.   Yes [provider]  albuterol (PROVENTIL HFA;VENTOLIN HFA) 108 (90 BASE) MCG/ACT inhaler Inhale 2 puffs into the lungs every 6 (six) hours as needed for wheezing or shortness of breath. Reported on 02/06/2015   Yes [provider]  albuterol (PROVENTIL) (2.5 MG/3ML) 0.083% nebulizer solution Take 2.5 mg by nebulization every 6 (six) hours as needed for wheezing or shortness of breath.   Yes [provider]  alendronate (FOSAMAX) 70 MG tablet Take 1 tablet (70 mg total) by mouth once a week. Take with a full glass of water on an empty stomach. 02/04/22  Yes Pennington, Rebekah M, PA-C  ALPRAZolam Prudy Feeler) 1 MG tablet Take 1 mg by mouth at bedtime as needed for sleep. 12/02/16  Yes [provider]  aspirin EC 81 MG tablet Take 1 tablet (81 mg total) by mouth daily. Swallow whole. 03/27/20  Yes Jonelle Sidle, MD  cholecalciferol (VITAMIN D3) 25 MCG (1000 UNIT) tablet Take 2,000 Units by mouth daily.   Yes [provider]  fluticasone furoate-vilanterol (BREO ELLIPTA) 100-25 MCG/ACT AEPB Inhale 1 puff into the lungs daily.   Yes [provider]  ipratropium-albuterol (DUONEB) 0.5-2.5 (3) MG/3ML SOLN Take 3  mLs by nebulization every 4 (four) hours as needed. 09/17/21  Yes Particia Nearing, PA-C  methylPREDNISolone (MEDROL DOSEPAK) 4 MG TBPK tablet Taper over 6 days, 08/14/22  Yes Eber Hong, MD  omeprazole (PRILOSEC) 40 MG capsule Take 1 capsule (40 mg total) by mouth daily. 11/02/22  Yes Imogene Burn, MD  rosuvastatin (CRESTOR) 10 MG tablet Take 1 tablet (10 mg total) by mouth daily. 08/06/22 11/21/22 Yes Jonelle Sidle, MD      Allergies    Avelox [moxifloxacin hcl in nacl]    Review of Systems   Review of Systems  Respiratory:  Positive for shortness of breath.     Physical Exam Updated Vital Signs BP (!) 132/93   Pulse (!) 123   Temp 97.9 F (36.6 C) (Oral)   Resp (!) 22   Ht 5' (1.524 m)   Wt 44 kg   SpO2 99%   BMI 18.94 kg/m  Physical Exam Vitals and nursing note reviewed.  Constitutional:      General: She is in acute distress.  Pulmonary:     Effort: Tachypnea and respiratory distress present.     Breath sounds: Decreased breath sounds and wheezing present.  Musculoskeletal:        General: Normal range of motion.     Cervical back: Normal range of motion.  Skin:    General: Skin is warm and dry.     Capillary Refill: Capillary refill takes less than 2 seconds.  Neurological:  Mental Status: She is alert and oriented to person, place, and time.  Psychiatric:        Mood and Affect: Mood normal.        Behavior: Behavior normal.     ED Results / Procedures / Treatments   Labs (all labs ordered are listed, but only abnormal results are displayed) Labs Reviewed  CBC - Abnormal; Notable for the following components:      Result Value   WBC 13.7 (*)    Hemoglobin 15.2 (*)    HCT 46.6 (*)    All other components within normal limits  BASIC METABOLIC PANEL - Abnormal; Notable for the following components:   Glucose, Bld 105 (*)    Calcium 8.7 (*)    All other components within normal limits  BLOOD GAS, VENOUS - Abnormal; Notable for the  following components:   pO2, Ven 59 (*)    Bicarbonate 28.7 (*)    All other components within normal limits  RESP PANEL BY RT-PCR (RSV, FLU A&B, COVID)  [id number]    EKG None  Radiology DG Chest Portable 1 View  Result Date: 11/21/2022 CLINICAL DATA:  COPD exacerbation.  Shortness of breath EXAM: PORTABLE CHEST 1 VIEW COMPARISON:  X-ray 08/14/2022 FINDINGS: Hyperinflation. No consolidation, pneumothorax or effusion. No edema. Normal cardiopericardial silhouette. Overlapping cardiac leads. Nodular density along the lower left thorax could be nipple shadow. Would recommend follow up x-ray with nipple markers when appropriate. IMPRESSION: Hyperinflation with chronic changes. Possible nipple shadow along the left lung base but asymmetric from the right side. Recommend follow up x-ray with nipple marker when appropriate Electronically Signed   By: Karen Kays M.D.   On: 11/21/2022 17:53    Procedures .Critical Care  Performed by: Coral Spikes, DO Authorized by: Coral Spikes, DO   Critical care provider statement:    Critical care time (minutes):  30   Critical care time was exclusive of:  Separately billable procedures and treating other patients and teaching time   Critical care was necessary to treat or prevent imminent or life-threatening deterioration of the following conditions:  Respiratory failure and circulatory failure   Critical care was time spent personally by me on the following activities:  Examination of patient, pulse oximetry, re-evaluation of patient's condition, review of old charts, ordering and review of radiographic studies and ordering and review of laboratory studies   I assumed direction of critical care for this patient from another provider in my specialty: no     Care discussed with: admitting provider       Medications Ordered in ED Medications  albuterol (PROVENTIL,VENTOLIN) solution continuous neb (10 mg/hr Nebulization Not Given 11/21/22 1730)   azithromycin (ZITHROMAX) 500 mg in dextrose 5 % 250 mL IVPB (has no administration in time range)  cefTRIAXone (ROCEPHIN) 1 g in sodium chloride 0.9 % 100 mL IVPB (has no administration in time range)  albuterol (PROVENTIL) (2.5 MG/3ML) 0.083% nebulizer solution (10 mg  Given 11/21/22 1731)    ED Course/ Medical Decision Making/ A&P Clinical Course as of 11/21/22 1918  Sun Nov 21, 2022  1857 Patient notes improvement after hour-long breathing treatment, but still has diffuse wheezing and increased work of breathing.  Given her presentation today, concern for acute decompensation will admit for further treatment of COPD exacerbation.  Will order antibiotics. [TY]    Clinical Course User Index [TY] Coral Spikes, DO  Medical Decision Making 67 year old female present emergency department in severe respiratory distress likely secondary to her longstanding COPD.  Per chart review has multiple presentations for similar has been intubated back in 2017.  EMS reports giving 7.5 of albuterol, 1.5 of Atrovent and 125 Solu-Medrol prior to arrival.  No reported hypoxia.  Patient is tripoding, speaking in one-word sentences, has increased work of breathing.  Diminished breath sounds and wheezing throughout all lung fields.  Will place patient on BiPAP and give hour-long breathing treatment.  Will get basic labs, chest x-ray and COVID.  Anticipate admission     Amount and/or Complexity of Data Reviewed Labs: ordered.    Details: Patient with leukocytosis on her CBC, no anemia.  Basic metabolic panel without significant abnormality.  Normal kidney function.  VBG reassuring. Radiology: ordered and independent interpretation performed.    Details: No overt pneumonia ECG/medicine tests:     Details: Sinus tach.  No ST segment changes to indicate ischemia.  Risk Decision regarding hospitalization.          Final Clinical Impression(s) / ED Diagnoses Final  diagnoses:  COPD exacerbation [facility])    Rx / DC Orders ED Discharge Orders     None         Coral Spikes, DO 11/21/22 1901    Coral Spikes, DO 11/21/22 1918

## 2022-11-22 DIAGNOSIS — J441 Chronic obstructive pulmonary disease with (acute) exacerbation: Secondary | ICD-10-CM | POA: Diagnosis not present

## 2022-11-22 LAB — COMPREHENSIVE METABOLIC PANEL
ALT: 16 U/L (ref 0–44)
AST: 18 U/L (ref 15–41)
Albumin: 3.8 g/dL (ref 3.5–5.0)
Alkaline Phosphatase: 52 U/L (ref 38–126)
Anion gap: 7 (ref 5–15)
BUN: 16 mg/dL (ref 8–23)
CO2: 23 mmol/L (ref 22–32)
Calcium: 8.3 mg/dL — ABNORMAL LOW (ref 8.9–10.3)
Chloride: 102 mmol/L (ref 98–111)
Creatinine, Ser: 0.83 mg/dL (ref 0.44–1.00)
GFR, Estimated: >60 mL/min (ref >60)
Glucose, Bld: 178 mg/dL — ABNORMAL HIGH (ref 70–99)
Potassium: 3.8 mmol/L (ref 3.5–5.1)
Sodium: 132 mmol/L — ABNORMAL LOW (ref 135–145)
Total Bilirubin: 0.3 mg/dL (ref <1.2)
Total Protein: 7.9 g/dL (ref 6.5–8.1)

## 2022-11-22 LAB — CBC
HCT: 40.3 % (ref 36.0–46.0)
Hemoglobin: 13.4 g/dL (ref 12.0–15.0)
MCH: 30.3 pg (ref 26.0–34.0)
MCHC: 33.3 g/dL (ref 30.0–36.0)
MCV: 91.2 fL (ref 80.0–100.0)
Platelets: 333 10*3/uL (ref 150–400)
RBC: 4.42 MIL/uL (ref 3.87–5.11)
RDW: 13.3 % (ref 11.5–15.5)
WBC: 8 10*3/uL (ref 4.0–10.5)
nRBC: 0 % (ref 0.0–0.2)

## 2022-11-22 LAB — MAGNESIUM: Magnesium: 2 mg/dL (ref 1.7–2.4)

## 2022-11-22 LAB — HIV ANTIBODY (ROUTINE TESTING W REFLEX): HIV Screen 4th Generation wRfx: NONREACTIVE

## 2022-11-22 LAB — PHOSPHORUS: Phosphorus: 2.5 mg/dL (ref 2.5–4.6)

## 2022-11-22 MED ORDER — AMOXICILLIN-POT CLAVULANATE 875-125 MG PO TABS: 1.0000 | ORAL_TABLET | Freq: Two times a day (BID) | ORAL | 0 refills | Status: AC

## 2022-11-22 MED ORDER — ALPRAZOLAM 1 MG PO TABS
1.0000 mg | ORAL_TABLET | Freq: Every evening | ORAL | Status: DC | PRN
  Administered 2022-11-22 (×2): 1 mg via ORAL
  Filled 2022-11-22 (×2): qty 1

## 2022-11-22 MED ORDER — BUDESONIDE-FORMOTEROL FUMARATE 160-4.5 MCG/ACT IN AERO: 2.0000 | INHALATION_SPRAY | Freq: Two times a day (BID) | RESPIRATORY_TRACT | 12 refills | Status: DC

## 2022-11-22 MED ORDER — PREDNISONE 20 MG PO TABS: 20.0000 mg | ORAL_TABLET | Freq: Every day | ORAL | 0 refills | Status: AC

## 2022-11-22 NOTE — TOC Progression Note (Signed)
Transition of Care [facility]) - Progression Note    Patient Details  Name: Misty Richardson MRN: [id number] Date of Birth: 1955/01/23  [facility] CM/SW Contact  Leitha Bleak, RN Phone Number: 11/22/2022, 11:02 AM  Clinical Narrative:  [facility] consulted for home oxygen. CM went to the bedside. Patient wants to stay and work on weaning, not ready to go home with oxygen. RN/MD updated. TOC following.   Expected Discharge Plan: Home/Self Care    Expected Discharge Plan and Services         Expected Discharge Date: 11/22/22                                     Social Determinants of Health (SDOH) Interventions SDOH Screenings   Food Insecurity: No Food Insecurity (11/21/2022)  Housing: Low Risk  (11/21/2022)  Transportation Needs: No Transportation Needs (11/21/2022)  Utilities: Not At Risk (11/21/2022)  Tobacco Use: High Risk (11/21/2022)    Readmission Risk Interventions     No data to display

## 2022-11-22 NOTE — Care Management Obs Status (Signed)
MEDICARE OBSERVATION STATUS NOTIFICATION   Patient Details  Name: Misty Richardson MRN: [id number] Date of Birth: [birth year]   Medicare Observation Status Notification Given:  Yes    Corey Harold 11/22/2022, 4:20 PM

## 2022-11-22 NOTE — Progress Notes (Signed)
[facility] Progress Note  Patient: Misty Richardson    GNF:[id number]  DOA: 11/21/2022     Date of Service: the patient was seen and examined on 11/22/2022  Chief Complaint  Patient presents with   Shortness of Breath   Brief hospital course: Misty Richardson is a 67 y.o. female with medical history significant of COPD, GERD, anxiety, hyperlipidemia who presents emergency department due to 2-day onset of shortness of breath, she has been using breathing treatment at home without much relief, symptoms worsened this afternoon and she activated EMS.  On arrival of EMS team, albuterol, Atrovent, 25 mg of Solu-Medrol was given prior to arrival.  Patient has increased work of breathing, she was tripoding and speaking in full sentences on arrival to the ED.   ED Course:  In the emergency department, she was tachypneic, tachycardic, O2 sat was 99% on room air and other vital signs are within normal range.  Workup in the ED showed leukocytosis, H/H was 15.2/46.6.  BMP was normal except for blood glucose of 105.  Influenza A, B, SARS coronavirus 2, RSV was negative. Chest x-ray showed hyperinflation with chronic changes. Patient was placed on BiPAP and was treated with ceftriaxone, azithromycin and breathing treatment with albuterol was provided.   Assessment and Plan: # Acute exacerbation of COPD # Acute hypoxic respiratory failure Continue Xopenex, Breo Ellipta, Mucinex, Solu-Medrol, azithromycin. Continue Protonix to prevent steroid-induced ulcer Continue incentive spirometry and flutter valve Continue supplemental O2 inhalation and gradually wean off Patient is saturating 88% on room air while ambulation Continue current treatment, we will try to wean off oxygen gradually and discharge most likely tomorrow a.m.  # Leukocytosis possibly reactive WBC 13.7, no obvious clinical sign of acute infectious process at this time Continue to monitor WBC with morning labs   # GERD Continue  Protonix   # Mixed hyperlipidemia Continue Crestor   # Nicotine dependence, tobacco use disorder Patient states that she only smokes occasionally and that 1 cigarette may last up to 1 week She was counseled on tobacco use cessation   Body mass index is 18.94 kg/m.  Interventions:  Diet: Heart healthy diet DVT Prophylaxis: Subcutaneous Lovenox   Advance goals of care discussion: Full code  Family Communication: family was not present at bedside, at the time of interview.  The pt provided permission to discuss medical plan with the family. Opportunity was given to ask question and all questions were answered satisfactorily.   Disposition:  Pt is from Home, admitted with COPD, acute hypoxic respiratory failure, still has low oxygen on ambulation, which precludes a safe discharge. Discharge to home, when stable, most likely tomorrow a.m.  Subjective: No significant events overnight, patient still having mild shortness of breath, overall feels improvement, mild cough with phlegm production.  No any other active issues.  Physical Exam: General: NAD, lying comfortably Appear in no distress, affect appropriate Eyes: PERRLA ENT: Oral Mucosa Clear, moist  Neck: no JVD,  Cardiovascular: S1 and S2 Present, no Murmur,  Respiratory: good respiratory effort, Bilateral Air entry equal and Decreased, no Crackles, no significant wheezing appreciated.   Abdomen: Bowel Sound present, Soft and no tenderness,  Skin: no rashes Extremities: no Pedal edema, no calf tenderness Neurologic: without any new focal findings Gait not checked due to patient safety concerns  Vitals:   11/21/22 2317 11/22/22 0019 11/22/22 0501 11/22/22 0746  BP:  128/74 99/63   Pulse:  (!) 102 75   Resp: 16 20 18    Temp:  98.3 F (36.8 C)   TempSrc:   Oral   SpO2:  97% 93% 94%  Weight:      Height:        Intake/Output Summary (Last 24 hours) at 11/22/2022 1330 Last data filed at 11/22/2022 0900 Gross per 24  hour  Intake 580 ml  Output --  Net 580 ml   Filed Weights   11/21/22 1723  Weight: 44 kg    Data Reviewed: I have personally reviewed and interpreted daily labs, tele strips, imagings as discussed above. I reviewed all nursing notes, pharmacy notes, vitals, pertinent old records I have discussed plan of care as described above with RN and patient/family.  CBC: Recent Labs  Lab 11/21/22 1728 11/22/22 0428  WBC 13.7* 8.0  HGB 15.2* 13.4  HCT 46.6* 40.3  MCV 92.3 91.2  PLT 387 333   Basic Metabolic Panel: Recent Labs  Lab 11/21/22 1728 11/22/22 0428  NA 136 132*  K 4.2 3.8  CL 104 102  CO2 23 23  GLUCOSE 105* 178*  BUN 15 16  CREATININE 0.90 0.83  CALCIUM 8.7* 8.3*  MG  --  2.0  PHOS  --  2.5    Studies: DG Chest Portable 1 View  Result Date: 11/21/2022 CLINICAL DATA:  COPD exacerbation.  Shortness of breath EXAM: PORTABLE CHEST 1 VIEW COMPARISON:  X-ray 08/14/2022 FINDINGS: Hyperinflation. No consolidation, pneumothorax or effusion. No edema. Normal cardiopericardial silhouette. Overlapping cardiac leads. Nodular density along the lower left thorax could be nipple shadow. Would recommend follow up x-ray with nipple markers when appropriate. IMPRESSION: Hyperinflation with chronic changes. Possible nipple shadow along the left lung base but asymmetric from the right side. Recommend follow up x-ray with nipple marker when appropriate Electronically Signed   By: Karen Kays M.D.   On: 11/21/2022 17:53    Scheduled Meds:  albuterol  10 mg/hr Nebulization Once   azithromycin  250 mg Oral Daily   dextromethorphan-guaiFENesin  1 tablet Oral BID   enoxaparin (LOVENOX) injection  30 mg Subcutaneous Q24H   fluticasone furoate-vilanterol  1 puff Inhalation Daily   levalbuterol  0.63 mg Nebulization Q6H   methylPREDNISolone (SOLU-MEDROL) injection  40 mg Intravenous Q12H   pantoprazole  40 mg Oral Daily   rosuvastatin  10 mg Oral Daily   Continuous Infusions: PRN Meds:  acetaminophen **OR** acetaminophen, ALPRAZolam, ondansetron **OR** ondansetron (ZOFRAN) IV  Time spent: 35 minutes  Author: Gillis Santa. MD [facility] 11/22/2022 1:30 PM  To reach On-call, see care teams to locate the attending and reach out to them via [email or web address]. If 7PM-7AM, please contact night-coverage If you still have difficulty reaching the attending provider, please page the [facility] (Director on Call) for [facility] on amion for assistance.

## 2022-11-22 NOTE — Progress Notes (Signed)
Mobility Specialist Progress Note:    11/22/22 1200  Mobility  Activity Ambulated with assistance in hallway  Level of Assistance Independent  Assistive Device None  Distance Ambulated (ft) 140 ft  Range of Motion/Exercises Active;All extremities  Activity Response Tolerated well  Mobility Referral Yes  $Mobility charge 1 Mobility  Mobility Specialist Start Time (ACUTE ONLY) 1130  Mobility Specialist Stop Time (ACUTE ONLY) 1145  Mobility Specialist Time Calculation (min) (ACUTE ONLY) 15 min   Pt received in bed, agreeable to mobility. Independently able to stand and ambulate with no AD. Tolerated well, baseline SpO2 94% on RA at rest. SpO2 90% on RA during ambulation, SpO2 86% on RA sitting EOB at EOS. Pt wanted to recover on RA. SpO2 92% on RA sitting EOB. Advised pt to wear [place] for SOB. Left in bed, all needs met.   Lawerance Bach Mobility Specialist Please contact via Special educational needs teacher or  Rehab office at [phone]

## 2022-11-22 NOTE — Progress Notes (Addendum)
SATURATION QUALIFICATIONS: (This note is used to comply with regulatory documentation for home oxygen)  Patient Saturations on Room Air at Rest = 93%  Patient Saturations on Room Air while Ambulating = 88%  Patient Saturations on 2 Liters of oxygen while Ambulating = 92%  Please briefly explain why patient needs home oxygen:Pt hr up from 85 to 104 and sats ra at 88% with ambulating approx 90 yards.  Took several minutes for recovery to 95% and hr down to 91 after ambulating with the 02 2l replaced. Also ambulated with 02 2l same distance with hr up to 98 and sats 92

## 2022-11-22 NOTE — Progress Notes (Signed)
   11/22/22 0924  TOC Brief Assessment  Insurance and Status Reviewed  Patient has primary care physician Yes  Home environment has been reviewed Home with children  Prior level of function: independent  Prior/Current Home Services No current home services  Social Determinants of Health Reivew SDOH reviewed no interventions necessary  Readmission risk has been reviewed Yes  Transition of care needs no transition of care needs at this time   Transition of Care Department [facility]) has reviewed patient and no TOC needs have been identified at this time. We will continue to monitor patient advancement through interdisciplinary progression rounds. If new patient transition needs arise, please place a TOC consult.

## 2022-11-23 DIAGNOSIS — J441 Chronic obstructive pulmonary disease with (acute) exacerbation: Secondary | ICD-10-CM | POA: Diagnosis not present

## 2022-11-23 LAB — CBC
HCT: 40.1 % (ref 36.0–46.0)
Hemoglobin: 13.3 g/dL (ref 12.0–15.0)
MCH: 30.6 pg (ref 26.0–34.0)
MCHC: 33.2 g/dL (ref 30.0–36.0)
MCV: 92.2 fL (ref 80.0–100.0)
Platelets: 348 10*3/uL (ref 150–400)
RBC: 4.35 MIL/uL (ref 3.87–5.11)
RDW: 13.4 % (ref 11.5–15.5)
WBC: 16.4 10*3/uL — ABNORMAL HIGH (ref 4.0–10.5)
nRBC: 0 % (ref 0.0–0.2)

## 2022-11-23 LAB — BASIC METABOLIC PANEL
Anion gap: 7 (ref 5–15)
BUN: 23 mg/dL (ref 8–23)
CO2: 25 mmol/L (ref 22–32)
Calcium: 8.4 mg/dL — ABNORMAL LOW (ref 8.9–10.3)
Chloride: 101 mmol/L (ref 98–111)
Creatinine, Ser: 0.87 mg/dL (ref 0.44–1.00)
GFR, Estimated: >60 mL/min (ref >60)
Glucose, Bld: 142 mg/dL — ABNORMAL HIGH (ref 70–99)
Potassium: 4.4 mmol/L (ref 3.5–5.1)
Sodium: 133 mmol/L — ABNORMAL LOW (ref 135–145)

## 2022-11-23 LAB — PHOSPHORUS: Phosphorus: 3.4 mg/dL (ref 2.5–4.6)

## 2022-11-23 LAB — MAGNESIUM: Magnesium: 2.4 mg/dL (ref 1.7–2.4)

## 2022-11-23 MED ORDER — LEVALBUTEROL HCL 0.63 MG/3ML IN NEBU
0.6300 mg | INHALATION_SOLUTION | Freq: Three times a day (TID) | RESPIRATORY_TRACT | Status: DC
  Administered 2022-11-23: 0.63 mg via RESPIRATORY_TRACT
  Filled 2022-11-23: qty 3

## 2022-11-23 NOTE — Plan of Care (Addendum)
Pt is alert and oriented x 4. Up adlib to bsc. Oxygen weaned to 0.5 L/[place]. Will attempted to wean to RA by end of shift. Vitals stable. PRN given was xanax at bedtime. Pt denies pain . BM 11/4 Problem: Education: Goal: Knowledge of General Education information will improve Description: Including pain rating scale, medication(s)/side effects and non-pharmacologic comfort measures Outcome: Progressing   Problem: Health Behavior/Discharge Planning: Goal: Ability to manage health-related needs will improve Outcome: Progressing   Problem: Clinical Measurements: Goal: Ability to maintain clinical measurements within normal limits will improve Outcome: Progressing Goal: Will remain free from infection Outcome: Progressing Goal: Diagnostic test results will improve Outcome: Progressing Goal: Respiratory complications will improve Outcome: Progressing Goal: Cardiovascular complication will be avoided Outcome: Progressing   Problem: Activity: Goal: Risk for activity intolerance will decrease Outcome: Progressing   Problem: Nutrition: Goal: Adequate nutrition will be maintained Outcome: Progressing   Problem: Coping: Goal: Level of anxiety will decrease Outcome: Progressing   Problem: Elimination: Goal: Will not experience complications related to bowel motility Outcome: Progressing Goal: Will not experience complications related to urinary retention Outcome: Progressing   Problem: Pain Management: Goal: General experience of comfort will improve Outcome: Progressing   Problem: Safety: Goal: Ability to remain free from injury will improve Outcome: Progressing   Problem: Skin Integrity: Goal: Risk for impaired skin integrity will decrease Outcome: Progressing

## 2022-11-23 NOTE — Discharge Summary (Signed)
[facility] Discharge Summary   Patient: Misty Richardson [id number]:[id number]  PCP: Elfredia Nevins, MD  Date of admission: 11/21/2022   Date of discharge: 11/23/2022     Discharge Diagnoses:  Principal Problem:   Acute exacerbation of chronic obstructive pulmonary disease (COPD) (HCC) Active Problems:   Leukocytosis   GERD without esophagitis   Mixed hyperlipidemia   Admitted From: Home Disposition:  Home  Recommendations for Outpatient Follow-up:  PCP: in 1 wk Follow up LABS/TEST:  None   Follow-up Information     Elfredia Nevins, MD Follow up in 1 week(s).   Specialty: Internal Medicine Contact information: [address] [place] [address] [phone]                Diet recommendation: Regular diet  Activity: The patient is advised to gradually reintroduce usual activities, as tolerated  Discharge Condition: stable  Code Status: Full code   History of present illness: As per the H and P dictated on admission Hospital Course:  Misty Richardson is a 67 y.o. female with medical history significant of COPD, GERD, anxiety, hyperlipidemia who presents emergency department due to 2-day onset of shortness of breath, she has been using breathing treatment at home without much relief, symptoms worsened this afternoon and she activated EMS.  On arrival of EMS team, albuterol, Atrovent, 25 mg of Solu-Medrol was given prior to arrival.  Patient has increased work of breathing, she was tripoding and speaking in full sentences on arrival to the ED.   ED Course:  In the emergency department, she was tachypneic, tachycardic, O2 sat was 99% on room air and other vital signs are within normal range.  Workup in the ED showed leukocytosis, H/H was 15.2/46.6.  BMP was normal except for blood glucose of 105.  Influenza A, B, SARS coronavirus 2, RSV was negative. Chest x-ray showed hyperinflation with chronic changes. Patient was placed on BiPAP and was treated with  ceftriaxone, azithromycin and breathing treatment with albuterol was provided.     Assessment and Plan: # Acute exacerbation of COPD # Acute hypoxic respiratory failure, resolved, currently saturating well on room air. S/p Xopenex, Breo Ellipta, Mucinex, Solu-Medrol, azithromycin. Continue Protonix for GI prophylaxis. Supplemental O2 admission gradually weaned off, currently patient is saturating well on room air.  Patient does not want Z-Pak so discharged on Augmentin twice daily for 5 days and prednisone 20 mg p.o. daily for 5 days.  Continue albuterol inhaler as needed and prescribed Symbicort inhaler.  Patient does not want Breo Ellipta inhaler.   # Leukocytosis, most likely reactive WBC 13.7--8.0--16.4, no obvious clinical sign of infection at this time.  Patient was advised to follow with PCP in 1 week. # GERD: Continue Protonix # Mixed hyperlipidemia: Continue Crestor # Nicotine dependence, tobacco use disorder Patient states that she only smokes occasionally and that 1 cigarette may last up to 1 week.  Smoking cessation counseling done.    Body mass index is 18.94 kg/m.  Nutrition Interventions:  Patient was ambulatory without any assistance. On the day of the discharge the patient's vitals were stable, and no other acute medical condition were reported by patient. the patient was felt safe to be discharge at Home.  Consultants: None Procedures: None  Discharge Exam: General: Appear in no distress, no Rash; Oral Mucosa Clear, moist. Cardiovascular: S1 and S2 Present, no Murmur, Respiratory: normal respiratory effort, Bilateral Air entry present and no Crackles, no wheezes Abdomen: Bowel Sound present, Soft and no tenderness, no hernia Extremities:  no Pedal edema, no calf tenderness Neurology: alert and oriented to time, place, and person affect appropriate.  Filed Weights   11/21/22 1723  Weight: 44 kg   Vitals:   11/23/22 0819 11/23/22 0830  BP:  102/70  Pulse:  75   Resp:  16  Temp:  98 F (36.7 C)  SpO2: 94% 92%    DISCHARGE MEDICATION: Allergies as of 11/23/2022       Reactions   Avelox [moxifloxacin Hcl In Nacl] Rash        Medication List     STOP taking these medications    Breo Ellipta 100-25 MCG/ACT Aepb Generic drug: fluticasone furoate-vilanterol   methylPREDNISolone 4 MG Tbpk tablet Commonly known as: MEDROL DOSEPAK       TAKE these medications    acetaminophen 500 MG tablet Commonly known as: TYLENOL Take 500 mg by mouth every 6 (six) hours as needed.   albuterol (2.5 MG/3ML) 0.083% nebulizer solution Commonly known as: PROVENTIL Take 2.5 mg by nebulization every 6 (six) hours as needed for wheezing or shortness of breath.   albuterol 108 (90 Base) MCG/ACT inhaler Commonly known as: VENTOLIN HFA Inhale 2 puffs into the lungs every 6 (six) hours as needed for wheezing or shortness of breath. Reported on 02/06/2015   alendronate 70 MG tablet Commonly known as: FOSAMAX Take 1 tablet (70 mg total) by mouth once a week. Take with a full glass of water on an empty stomach.   ALPRAZolam 1 MG tablet Commonly known as: XANAX Take 1 mg by mouth at bedtime as needed for sleep.   amoxicillin-clavulanate 875-125 MG tablet Commonly known as: AUGMENTIN Take 1 tablet by mouth 2 (two) times daily for 5 days.   aspirin EC 81 MG tablet Take 1 tablet (81 mg total) by mouth daily. Swallow whole.   budesonide-formoterol 160-4.5 MCG/ACT inhaler Commonly known as: SYMBICORT Inhale 2 puffs into the lungs in the morning and at bedtime.   cholecalciferol 25 MCG (1000 UNIT) tablet Commonly known as: VITAMIN D3 Take 2,000 Units by mouth daily.   ipratropium-albuterol 0.5-2.5 (3) MG/3ML Soln Commonly known as: DUONEB Take 3 mLs by nebulization every 4 (four) hours as needed.   omeprazole 40 MG capsule Commonly known as: PRILOSEC Take 1 capsule (40 mg total) by mouth daily.   predniSONE 20 MG tablet Commonly known as:  DELTASONE Take 1 tablet (20 mg total) by mouth daily with breakfast for 5 days.   rosuvastatin 10 MG tablet Commonly known as: CRESTOR Take 1 tablet (10 mg total) by mouth daily.       Allergies  Allergen Reactions   Avelox [Moxifloxacin Hcl In Nacl] Rash   Discharge Instructions     Call MD for:  difficulty breathing, headache or visual disturbances   Complete by: As directed    Call MD for:  extreme fatigue   Complete by: As directed    Call MD for:  persistant dizziness or light-headedness   Complete by: As directed    Call MD for:  persistant nausea and vomiting   Complete by: As directed    Call MD for:  severe uncontrolled pain   Complete by: As directed    Call MD for:  temperature >100.4   Complete by: As directed    Diet - low sodium heart healthy   Complete by: As directed    Discharge instructions   Complete by: As directed    F/u PCP in 1 wk   Increase activity slowly  Complete by: As directed        The results of significant diagnostics from this hospitalization (including imaging, microbiology, ancillary and laboratory) are listed below for reference.    Significant Diagnostic Studies: DG Chest Portable 1 View  Result Date: 11/21/2022 CLINICAL DATA:  COPD exacerbation.  Shortness of breath EXAM: PORTABLE CHEST 1 VIEW COMPARISON:  X-ray 08/14/2022 FINDINGS: Hyperinflation. No consolidation, pneumothorax or effusion. No edema. Normal cardiopericardial silhouette. Overlapping cardiac leads. Nodular density along the lower left thorax could be nipple shadow. Would recommend follow up x-ray with nipple markers when appropriate. IMPRESSION: Hyperinflation with chronic changes. Possible nipple shadow along the left lung base but asymmetric from the right side. Recommend follow up x-ray with nipple marker when appropriate Electronically Signed   By: Karen Kays M.D.   On: 11/21/2022 17:53    Microbiology: Recent Results (from the past 240 hour(s))  Resp panel  by RT-PCR (RSV, Flu A&B, Covid) Anterior Nasal Swab     Status: None   Collection Time: 11/21/22  5:30 PM   Specimen: Anterior Nasal Swab  Result Value Ref Range Status   SARS Coronavirus 2 by RT PCR NEGATIVE NEGATIVE Final    Comment: (NOTE) SARS-CoV-2 target nucleic acids are NOT DETECTED.  The SARS-CoV-2 RNA is generally detectable in upper respiratory specimens during the acute phase of infection. The lowest concentration of SARS-CoV-2 viral copies this assay can detect is 138 copies/mL. A negative result does not preclude SARS-Cov-2 infection and should not be used as the sole basis for treatment or other patient management decisions. A negative result may occur with  improper specimen collection/handling, submission of specimen other than nasopharyngeal swab, presence of viral mutation(s) within the areas targeted by this assay, and inadequate number of viral copies(<138 copies/mL). A negative result must be combined with clinical observations, patient history, and epidemiological information. The expected result is Negative.  Fact Sheet for Patients:  BloggerCourse.com  Fact Sheet for Healthcare Providers:  SeriousBroker.it  This test is no t yet approved or cleared by the [place] FDA and  has been authorized for detection and/or diagnosis of SARS-CoV-2 by FDA under an Emergency Use Authorization (EUA). This EUA will remain  in effect (meaning this test can be used) for the duration of the COVID-19 declaration under Section 564(b)(1) of the Act, 21 U.S.C.section 360bbb-3(b)(1), unless the authorization is terminated  or revoked sooner.       Influenza A by PCR NEGATIVE NEGATIVE Final   Influenza B by PCR NEGATIVE NEGATIVE Final    Comment: (NOTE) The Xpert Xpress SARS-CoV-2/FLU/RSV plus assay is intended as an aid in the diagnosis of influenza from Nasopharyngeal swab specimens and should not be used as a sole  basis for treatment. Nasal washings and aspirates are unacceptable for Xpert Xpress SARS-CoV-2/FLU/RSV testing.  Fact Sheet for Patients: BloggerCourse.com  Fact Sheet for Healthcare Providers: SeriousBroker.it  This test is not yet approved or cleared by the [place] FDA and has been authorized for detection and/or diagnosis of SARS-CoV-2 by FDA under an Emergency Use Authorization (EUA). This EUA will remain in effect (meaning this test can be used) for the duration of the COVID-19 declaration under Section 564(b)(1) of the Act, 21 U.S.C. section 360bbb-3(b)(1), unless the authorization is terminated or revoked.     Resp Syncytial Virus by PCR NEGATIVE NEGATIVE Final    Comment: (NOTE) Fact Sheet for Patients: BloggerCourse.com  Fact Sheet for Healthcare Providers: SeriousBroker.it  This test is not yet approved or cleared by  the [employer] and has been authorized for detection and/or diagnosis of SARS-CoV-2 by FDA under an Emergency Use Authorization (EUA). This EUA will remain in effect (meaning this test can be used) for the duration of the COVID-19 declaration under Section 564(b)(1) of the Act, 21 U.S.C. section 360bbb-3(b)(1), unless the authorization is terminated or revoked.  Performed at [facility], [address], [place], [place] [address]      Labs: CBC: Recent Labs  Lab 11/21/22 1728 11/22/22 0428 11/23/22 0416  WBC 13.7* 8.0 16.4*  HGB 15.2* 13.4 13.3  HCT 46.6* 40.3 40.1  MCV 92.3 91.2 92.2  PLT 387 333 348   Basic Metabolic Panel: Recent Labs  Lab 11/21/22 1728 11/22/22 0428 11/23/22 0416  NA 136 132* 133*  K 4.2 3.8 4.4  CL 104 102 101  CO2 23 23 25   GLUCOSE 105* 178* 142*  BUN 15 16 23   CREATININE 0.90 0.83 0.87  CALCIUM 8.7* 8.3* 8.4*  MG  --  2.0 2.4  PHOS  --  2.5 3.4   Liver Function Tests: Recent Labs  Lab  11/22/22 0428  AST 18  ALT 16  ALKPHOS 52  BILITOT 0.3  PROT 7.9  ALBUMIN 3.8   No results for input(s): "LIPASE", "AMYLASE" in the last 168 hours. No results for input(s): "AMMONIA" in the last 168 hours. Cardiac Enzymes: No results for input(s): "CKTOTAL", "CKMB", "CKMBINDEX", "TROPONINI" in the last 168 hours. BNP (last 3 results) No results for input(s): "BNP" in the last 8760 hours. CBG: No results for input(s): "GLUCAP" in the last 168 hours.  Time spent: 35 minutes  Signed:  Gillis Santa  [facility] 11/23/2022 9:44 AM

## 2022-11-23 NOTE — Progress Notes (Signed)
Pt ambulated in hall way this am Room air due to sating 90-93 % at rest on room air. When ambulated pt maintained saturation until she has walked past 250ft. Sats dropped to 87 %. Incentive spirometer supplied and pt achieved 1000. Pt encouraged to use to increase lung strength and assist in clearing mucus. Pt requested not be placed back on oxygen sating 90% at rest.

## 2022-11-23 NOTE — Progress Notes (Signed)
Mobility Specialist Progress Note:    11/23/22 1020  Mobility  Activity Ambulated with assistance in hallway  Level of Assistance Independent  Assistive Device None  Distance Ambulated (ft) 160 ft  Range of Motion/Exercises Active;All extremities  Activity Response Tolerated well  Mobility Referral Yes  $Mobility charge 1 Mobility  Mobility Specialist Start Time (ACUTE ONLY) 1020  Mobility Specialist Stop Time (ACUTE ONLY) 1035  Mobility Specialist Time Calculation (min) (ACUTE ONLY) 15 min   Pt received in bed, agreeable to mobility. Independently able to stand and ambulate with no AD. Tolerated well, baseline SpO2 86% on RA standing EOB. Recovered, SpO2 90% on RA standing EOB. SpO2 91% on RA during ambulation. Returned pt to room, SpO2 87% on RA at EOS. Recovered, SpO2 90% on RA sitting EOB. Left pt in room, notified RN of O2 sats. All needs met.    Misty Richardson Mobility Specialist Please contact via Special educational needs teacher or  Rehab office at [phone]

## 2022-11-29 DIAGNOSIS — J449 Chronic obstructive pulmonary disease, unspecified: Secondary | ICD-10-CM | POA: Diagnosis not present

## 2022-11-29 DIAGNOSIS — J441 Chronic obstructive pulmonary disease with (acute) exacerbation: Secondary | ICD-10-CM | POA: Diagnosis not present

## 2022-11-29 DIAGNOSIS — R911 Solitary pulmonary nodule: Secondary | ICD-10-CM | POA: Diagnosis not present

## 2022-11-29 DIAGNOSIS — Z23 Encounter for immunization: Secondary | ICD-10-CM | POA: Diagnosis not present

## 2022-11-29 DIAGNOSIS — Z681 Body mass index (BMI) 19 or less, adult: Secondary | ICD-10-CM | POA: Diagnosis not present

## 2022-11-29 DIAGNOSIS — M48062 Spinal stenosis, lumbar region with neurogenic claudication: Secondary | ICD-10-CM | POA: Diagnosis not present

## 2023-02-02 ENCOUNTER — Inpatient Hospital Stay: Payer: Medicare HMO | Attending: Hematology

## 2023-02-02 DIAGNOSIS — D472 Monoclonal gammopathy: Secondary | ICD-10-CM | POA: Diagnosis not present

## 2023-02-02 DIAGNOSIS — M8008XA Age-related osteoporosis with current pathological fracture, vertebra(e), initial encounter for fracture: Secondary | ICD-10-CM | POA: Diagnosis not present

## 2023-02-02 DIAGNOSIS — F17210 Nicotine dependence, cigarettes, uncomplicated: Secondary | ICD-10-CM | POA: Diagnosis not present

## 2023-02-02 LAB — COMPREHENSIVE METABOLIC PANEL
ALT: 20 U/L (ref 0–44)
AST: 25 U/L (ref 15–41)
Albumin: 4.3 g/dL (ref 3.5–5.0)
Alkaline Phosphatase: 62 U/L (ref 38–126)
Anion gap: 7 (ref 5–15)
BUN: 16 mg/dL (ref 8–23)
CO2: 25 mmol/L (ref 22–32)
Calcium: 9.2 mg/dL (ref 8.9–10.3)
Chloride: 103 mmol/L (ref 98–111)
Creatinine, Ser: 0.83 mg/dL (ref 0.44–1.00)
GFR, Estimated: >60 mL/min (ref >60)
Glucose, Bld: 83 mg/dL (ref 70–99)
Potassium: 3.8 mmol/L (ref 3.5–5.1)
Sodium: 135 mmol/L (ref 135–145)
Total Bilirubin: 0.9 mg/dL (ref 0.0–1.2)
Total Protein: 8.7 g/dL — ABNORMAL HIGH (ref 6.5–8.1)

## 2023-02-02 LAB — CBC WITH DIFFERENTIAL/PLATELET
Abs Immature Granulocytes: 0.01 10*3/uL (ref 0.00–0.07)
Basophils Absolute: 0.1 10*3/uL (ref 0.0–0.1)
Basophils Relative: 1 %
Eosinophils Absolute: 0.3 10*3/uL (ref 0.0–0.5)
Eosinophils Relative: 4 %
HCT: 44.3 % (ref 36.0–46.0)
Hemoglobin: 14.6 g/dL (ref 12.0–15.0)
Immature Granulocytes: 0 %
Lymphocytes Relative: 33 %
Lymphs Abs: 2.5 10*3/uL (ref 0.7–4.0)
MCH: 30.1 pg (ref 26.0–34.0)
MCHC: 33 g/dL (ref 30.0–36.0)
MCV: 91.3 fL (ref 80.0–100.0)
Monocytes Absolute: 0.5 10*3/uL (ref 0.1–1.0)
Monocytes Relative: 6 %
Neutro Abs: 4.1 10*3/uL (ref 1.7–7.7)
Neutrophils Relative %: 56 %
Platelets: 400 10*3/uL (ref 150–400)
RBC: 4.85 MIL/uL (ref 3.87–5.11)
RDW: 13.4 % (ref 11.5–15.5)
WBC: 7.5 10*3/uL (ref 4.0–10.5)
nRBC: 0 % (ref 0.0–0.2)

## 2023-02-02 LAB — LACTATE DEHYDROGENASE: LDH: 96 U/L — ABNORMAL LOW (ref 98–192)

## 2023-02-03 LAB — KAPPA/LAMBDA LIGHT CHAINS
Kappa free light chain: 193.1 mg/L — ABNORMAL HIGH (ref 3.3–19.4)
Kappa, lambda light chain ratio: 28.82 — ABNORMAL HIGH (ref 0.26–1.65)
Lambda free light chains: 6.7 mg/L (ref 5.7–26.3)

## 2023-02-08 NOTE — Progress Notes (Addendum)
[facility] [address], [place] [address]   CLINIC:  Medical Oncology/Hematology  PCP:  Elfredia Nevins, MD [address] [place] [address] [phone]   REASON FOR VISIT:  Follow-up for MGUS   PRIOR THERAPY: None   CURRENT THERAPY: Observation  INTERVAL HISTORY:   Misty Richardson 68 y.o. female returns for routine follow-up of MGUS.  She was last seen by Rojelio Brenner, PA-C on 10/11/2022.  Since her last visit, she was hospitalized from 11/21/2022 through 11/23/2022 for COPD exacerbation.  She denies any new bone pain or recent fractures, but has some chronic aching in her left shoulder.  She does have chronic intermittent left-sided ankle pain due to remote history of left ankle fracture, as well as intermittent back pain related to chronic T7 compression fracture.  She denies any B symptoms such as fever, chills, or night sweats.  Since her last visit, she has increased her smoking again and is back to smoking 1 PPD.  No new neurologic symptoms such as tinnitus, new-onset hearing loss, blurred vision, headache, or dizziness.  Denies any numbness or tingling in hands or feet.  No new masses or lymphadenopathy per her report.  She denies any unusual fatigue.  She has 75% energy and 100% appetite. She endorses that she is maintaining a stable weight.  ASSESSMENT & PLAN:  1.  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 (Previous biopsy in August 2010 showed 3% plasma cells) - Most recent skeletal survey (11/08/2022): No worrisome lytic or sclerotic lesions.  Chronic midthoracic compression fracture, old. - 24-hour urine/UPEP (06/07/2022): Positive for kappa type Bence-Jones protein, but negative for M spike - Myeloma panel (02/02/2023) is essentially stable. M spike 1.9% Elevated kappa light chains 193.1, normal lambda light chain 6.7, elevated ratio 28.82. Normal  LDH No CRAB features (creatinine 0.83, calcium 8.7, Hgb 14.6) - She does not report any new onset bone pains, B symptoms, or recurrent infections      - PLAN: No evidence of progression to multiple myeloma at this time -- We will continue to watch closely with repeat MGUS/myeloma panel in 4 months - Recheck annual 24-hour urine/UPEP prior to follow-up in 4 months - Next skeletal survey due October 2025 - Will consider bone marrow biopsy and/or PET if significant progression or development of CRAB signs/symptoms ## ADDENDUM (02/14/2023): SPEP results showed M spike 1.9.  Although this is trending upward from labs in September 2024, it remains within her overall baseline range.  We will continue to plan on follow-up in 4 months. [name], PA-C 02/14/23 11:35 AM )   2.  Smoking history: - She smoked 1 pack/day for the past 42 years - currently cutting back and trying to quit, smoking about a pack per month - CT lung on 11/24/2019 returned showing a Lung-RADS 2S - LDCT chest (04/12/2022): Lung RADS 2, benign appearance or behavior - PLAN: Continue annual LDCT chest, next due March 2025.     3.  Osteoporosis: - Bone density test on 08/13/2019 shows T score -3.4. - Most recent bone density/DEXA scan (08/17/2021): Osteoporosis with T-score -3.5 - She is taking Fosamax weekly since February 2016 and also continue to take calcium and vitamin D. - Last vitamin D normal at 58.79 (11/03/2021) - PLAN: Continue Fosamax, calcium, and vitamin D.  We will repeat bone density scan in July 2026. - She will have completed 10 years of  treatment with Fosamax in February 2026    4.  Health maintenance: - Mammogram on 03/08/2022 was BI-RADS Category 1, negative - PLAN: Continue screening mammograms via PCP  PLAN SUMMARY: >> LDCT chest in March 2025 >> 24-hour urine/UPEP/UIFE >> Labs in 4 months = CBC/D, CMP, SPEP, light chains, LDH >> OFFICE visit in 4 months (1 week after labs)      REVIEW OF  SYSTEMS:   Review of Systems  Constitutional:  Positive for fatigue (intermittent). Negative for appetite change, chills, diaphoresis, fever and unexpected weight change.  HENT:   Negative for lump/mass and nosebleeds.   Eyes:  Negative for eye problems.  Respiratory:  Positive for cough and shortness of breath (COPD). Negative for hemoptysis.   Cardiovascular:  Positive for chest pain ("heaviness," come and goes, none today). Negative for leg swelling and palpitations.  Gastrointestinal:  Negative for abdominal pain, blood in stool, constipation, diarrhea, nausea and vomiting.  Genitourinary:  Negative for hematuria.   Musculoskeletal:  Positive for arthralgias (Left shoulder).  Skin: Negative.   Neurological:  Negative for dizziness, headaches and light-headedness.  Hematological:  Does not bruise/bleed easily.  Psychiatric/Behavioral:  Negative for sleep disturbance. The patient is not nervous/anxious (at times).      PHYSICAL EXAM:  ECOG PERFORMANCE STATUS: 1 - Symptomatic but completely ambulatory  Vitals:   02/09/23 1407  BP: 113/80  Pulse: 75  Resp: 18  Temp: (!) 97.4 F (36.3 C)  SpO2: 93%   Filed Weights   02/09/23 1407  Weight: 99 lb (44.9 kg)   Physical Exam Constitutional:      Appearance: Normal appearance.  HENT:     Head: Normocephalic and atraumatic.     Mouth/Throat:     Mouth: Mucous membranes are moist.  Eyes:     Extraocular Movements: Extraocular movements intact.     Pupils: Pupils are equal, round, and reactive to light.  Cardiovascular:     Rate and Rhythm: Normal rate and regular rhythm.     Pulses: Normal pulses.     Heart sounds: Normal heart sounds.  Pulmonary:     Effort: Pulmonary effort is normal.     Breath sounds: Wheezing (faint) present.     Comments: Decreased breath sounds in all lung fields Abdominal:     General: Bowel sounds are normal.     Palpations: Abdomen is soft.     Tenderness: There is no abdominal tenderness.   Musculoskeletal:        General: No swelling.     Right lower leg: No edema.     Left lower leg: No edema.  Lymphadenopathy:     Cervical: No cervical adenopathy.  Skin:    General: Skin is warm and dry.  Neurological:     General: No focal deficit present.     Mental Status: She is alert and oriented to person, place, and time.  Psychiatric:        Mood and Affect: Mood normal.        Behavior: Behavior normal.     PAST MEDICAL/SURGICAL HISTORY:  Past Medical History:  Diagnosis Date   Anxiety    COPD (chronic obstructive pulmonary disease) (HCC)    Gallstones    GERD (gastroesophageal reflux disease)    MGUS (monoclonal gammopathy of unknown significance)    Osteoporosis    Past Surgical History:  Procedure Laterality Date   ABDOMINAL HYSTERECTOMY     APPENDECTOMY     BONE BIOPSY Left 03/25/2014   BONE  MARROW ASPIRATION Left 03/25/2014   BONE MARROW BIOPSY Left 03/19/2014   CESAREAN SECTION     x2    SOCIAL HISTORY:  Social History   Socioeconomic History   Marital status: Divorced    Spouse name: Not on file   Number of children: 2   Years of education: Not on file   Highest education level: Not on file  Occupational History   Occupation: Disabled  Tobacco Use   Smoking status: Every Day    Current packs/day: 0.00    Types: Cigarettes    Last attempt to quit: 07/11/2017    Years since quitting: 5.5   Smokeless tobacco: Former    Quit date: 07/31/2012  Vaping Use   Vaping status: Never Used  Substance and Sexual Activity   Alcohol use: Yes    Comment: occasionally   Drug use: No   Sexual activity: Not Currently  Other Topics Concern   Not on file  Social History Narrative   Not on file   Social Drivers of Health   Financial Resource Strain: Not on file  Food Insecurity: No Food Insecurity (11/21/2022)   Hunger Vital Sign    Worried About Running Out of Food in the Last Year: Never true    Ran Out of Food in the Last Year: Never true   Transportation Needs: No Transportation Needs (11/21/2022)   PRAPARE - Administrator, Civil Service (Medical): No    Lack of Transportation (Non-Medical): No  Physical Activity: Not on file  Stress: Not on file  Social Connections: Not on file  Intimate Partner Violence: Not At Risk (11/21/2022)   Humiliation, Afraid, Rape, and Kick questionnaire    Fear of Current or Ex-Partner: No    Emotionally Abused: No    Physically Abused: No    Sexually Abused: No    FAMILY HISTORY:  Family History  Problem Relation Age of Onset   Arthritis Mother    Heart failure Father    Colon cancer Maternal Grandmother    Esophageal cancer Paternal Grandfather    Stomach cancer Neg Hx    Colon polyps Neg Hx     CURRENT MEDICATIONS:  Outpatient Encounter Medications as of 02/09/2023  Medication Sig Note   acetaminophen (TYLENOL) 500 MG tablet Take 500 mg by mouth every 6 (six) hours as needed.    albuterol (PROVENTIL HFA;VENTOLIN HFA) 108 (90 BASE) MCG/ACT inhaler Inhale 2 puffs into the lungs every 6 (six) hours as needed for wheezing or shortness of breath. Reported on 02/06/2015    albuterol (PROVENTIL) (2.5 MG/3ML) 0.083% nebulizer solution Take 2.5 mg by nebulization every 6 (six) hours as needed for wheezing or shortness of breath.    alendronate (FOSAMAX) 70 MG tablet Take 1 tablet (70 mg total) by mouth once a week. Take with a full glass of water on an empty stomach.    ALPRAZolam (XANAX) 1 MG tablet Take 1 mg by mouth at bedtime as needed for sleep.    aspirin EC 81 MG tablet Take 1 tablet (81 mg total) by mouth daily. Swallow whole. 11/21/2022: Pt stated time was "before lunch" to be on the safe side, time put down was 11:30 am.   budesonide-formoterol (SYMBICORT) 160-4.5 MCG/ACT inhaler Inhale 2 puffs into the lungs in the morning and at bedtime.    cholecalciferol (VITAMIN D3) 25 MCG (1000 UNIT) tablet Take 2,000 Units by mouth daily.    ipratropium-albuterol (DUONEB) 0.5-2.5 (3)  MG/3ML SOLN Take 3 mLs by  nebulization every 4 (four) hours as needed.    pantoprazole (PROTONIX) 40 MG tablet Take 40 mg by mouth daily.    [DISCONTINUED] omeprazole (PRILOSEC) 40 MG capsule Take 1 capsule (40 mg total) by mouth daily.    rosuvastatin (CRESTOR) 10 MG tablet Take 1 tablet (10 mg total) by mouth daily.    No facility-administered encounter medications on file as of 02/09/2023.    ALLERGIES:  Allergies  Allergen Reactions   Avelox [Moxifloxacin Hcl In Nacl] Rash    LABORATORY DATA:  I have reviewed the labs as listed.  CBC    Component Value Date/Time   WBC 7.5 02/02/2023 1349   RBC 4.85 02/02/2023 1349   HGB 14.6 02/02/2023 1349   HGB 14.7 07/04/2021 1529   HCT 44.3 02/02/2023 1349   HCT 43.1 07/04/2021 1529   PLT 400 02/02/2023 1349   PLT 307 07/04/2021 1529   MCV 91.3 02/02/2023 1349   MCV 91 07/04/2021 1529   MCH 30.1 02/02/2023 1349   MCHC 33.0 02/02/2023 1349   RDW 13.4 02/02/2023 1349   RDW 12.8 07/04/2021 1529   LYMPHSABS 2.5 02/02/2023 1349   LYMPHSABS 2.7 07/04/2021 1529   MONOABS 0.5 02/02/2023 1349   EOSABS 0.3 02/02/2023 1349   EOSABS 0.4 07/04/2021 1529   BASOSABS 0.1 02/02/2023 1349   BASOSABS 0.1 07/04/2021 1529      Latest Ref Rng & Units 02/02/2023    1:49 PM 11/23/2022    4:16 AM 11/22/2022    4:28 AM  CMP  Glucose 70 - 99 mg/dL 83  161  096   BUN 8 - 23 mg/dL 16  23  16    Creatinine 0.44 - 1.00 mg/dL 0.45  4.09  8.11   Sodium 135 - 145 mmol/L 135  133  132   Potassium 3.5 - 5.1 mmol/L 3.8  4.4  3.8   Chloride 98 - 111 mmol/L 103  101  102   CO2 22 - 32 mmol/L 25  25  23    Calcium 8.9 - 10.3 mg/dL 9.2  8.4  8.3   Total Protein 6.5 - 8.1 g/dL 8.7   7.9   Total Bilirubin 0.0 - 1.2 mg/dL 0.9   0.3   Alkaline Phos 38 - 126 U/L 62   52   AST 15 - 41 U/L 25   18   ALT 0 - 44 U/L 20   16     DIAGNOSTIC IMAGING:  I have independently reviewed the relevant imaging and discussed with the patient.   WRAP UP:  All questions were  answered. The patient knows to call the clinic with any problems, questions or concerns.  Medical decision making: Moderate  Time spent on visit: I spent 20 minutes counseling the patient face to face. The total time spent in the appointment was 30 minutes and more than 50% was on counseling.  [name], PA-C  02/09/23 2:44 PM

## 2023-02-09 ENCOUNTER — Inpatient Hospital Stay: Payer: Medicare HMO | Admitting: Physician Assistant

## 2023-02-09 ENCOUNTER — Other Ambulatory Visit: Payer: Self-pay

## 2023-02-09 DIAGNOSIS — F17210 Nicotine dependence, cigarettes, uncomplicated: Secondary | ICD-10-CM | POA: Diagnosis not present

## 2023-02-09 DIAGNOSIS — D472 Monoclonal gammopathy: Secondary | ICD-10-CM

## 2023-02-09 DIAGNOSIS — M8008XA Age-related osteoporosis with current pathological fracture, vertebra(e), initial encounter for fracture: Secondary | ICD-10-CM | POA: Diagnosis not present

## 2023-02-09 DIAGNOSIS — Z122 Encounter for screening for malignant neoplasm of respiratory organs: Secondary | ICD-10-CM

## 2023-02-09 DIAGNOSIS — Z87891 Personal history of nicotine dependence: Secondary | ICD-10-CM

## 2023-02-09 NOTE — Patient Instructions (Signed)
[facility] at [facility] Discharge Instructions  You were seen today by Rojelio Brenner PA-C for your MGUS ("monoclonal gammopathy of undetermined significance").   MGUS ("monoclonal gammopathy of undetermined significance") versus "Smoldering Myeloma" As we discussed, this is a "precancerous" condition where you have slightly increased plasma cells making a slightly increased amount of abnormal immunoglobulin proteins. Although MGUS is not causing any current problems in your body, it has a 1% each year risk of progression to multiple myeloma cancer. At this time, your blood work does not show any sign of having progressed to multiple myeloma cancer.  Your blood work is stable.  However, you most likely fall in the "gray area" between MGUS and multiple myeloma, which is referred to as "smoldering myeloma." We will continue to monitor your labs every 4 months and check whole-body x-rays once a year (next due October 2025).  We will also check a 24-hour urine study.  Kit has been provided for you. FIRST MORNING: Discard first urine of the morning into the toilet. Collect the rest of your urine in the orange jug for the next 24 hours. SECOND MORNING: Collect your first urine of the morning in the jug.  This ends your 24-hour urine collection. **Store the urine jug in the refrigerator but is not being used. Return urine jug to fourth floor front desk as soon as it is completed.   Continue to take Fosamax once a week for your osteoporosis.  Lung cancer screening CT on 04/11/2023  FOLLOW-UP APPOINTMENT: Office visit in 4 months, after labs   ** Thank you for trusting me with your healthcare!  I strive to provide all of my patients with quality care at each visit.  If you receive a survey for this visit, I would be so grateful to you for taking the time to provide feedback.  Thank you in advance!  ~ Alexei Ey                   Dr. Doreatha Massed   &   Rojelio Brenner, PA-C   - - - - - - - - - - - - - - - - - -   Thank you for choosing [facility] at [facility] to provide your oncology and hematology care.  To afford each patient quality time with our provider, please arrive at least 15 minutes before your scheduled appointment time.   If you have a lab appointment with the Cancer Center please come in thru the Main Entrance and check in at the main information desk.  You need to re-schedule your appointment should you arrive 10 or more minutes late.  We strive to give you quality time with our providers, and arriving late affects you and other patients whose appointments are after yours.  Also, if you no show three or more times for appointments you may be dismissed from the clinic at the providers discretion.     Again, thank you for choosing [facility].  Our hope is that these requests will decrease the amount of time that you wait before being seen by our physicians.       _____________________________________________________________  Should you have questions after your visit to [facility], please contact our office at [phone] and follow the prompts.  Our office hours are 8:00 a.m. and 4:30 p.m. Monday - Friday.  Please note that voicemails left after 4:00 p.m. may not be returned until  the following business day.  We are closed weekends and major holidays.  You do have access to a nurse 24-7, just call the main number to the clinic [phone] and do not press any options, hold on the line and a nurse will answer the phone.    For prescription refill requests, have your pharmacy contact our office and allow 72 hours.    Due to Covid, you will need to wear a mask upon entering the hospital. If you do not have a mask, a mask will be given to you at the Main Entrance upon arrival. For doctor visits, patients may have 1 support person age 72 or older with them. For treatment visits, patients  can not have anyone with them due to social distancing guidelines and our immunocompromised population.

## 2023-02-10 LAB — PROTEIN ELECTROPHORESIS, SERUM
A/G Ratio: 1.1 (ref 0.7–1.7)
Albumin ELP: 4.2 g/dL (ref 2.9–4.4)
Alpha-1-Globulin: 0.3 g/dL (ref 0.0–0.4)
Alpha-2-Globulin: 0.8 g/dL (ref 0.4–1.0)
Beta Globulin: 0.9 g/dL (ref 0.7–1.3)
Gamma Globulin: 2.1 g/dL — ABNORMAL HIGH (ref 0.4–1.8)
Globulin, Total: 4 g/dL — ABNORMAL HIGH (ref 2.2–3.9)
M-Spike, %: 1.9 g/dL — ABNORMAL HIGH
Total Protein ELP: 8.2 g/dL (ref 6.0–8.5)

## 2023-02-14 ENCOUNTER — Other Ambulatory Visit: Payer: Self-pay

## 2023-02-14 DIAGNOSIS — D472 Monoclonal gammopathy: Secondary | ICD-10-CM | POA: Diagnosis not present

## 2023-02-14 DIAGNOSIS — M8008XA Age-related osteoporosis with current pathological fracture, vertebra(e), initial encounter for fracture: Secondary | ICD-10-CM | POA: Diagnosis not present

## 2023-02-14 DIAGNOSIS — F17210 Nicotine dependence, cigarettes, uncomplicated: Secondary | ICD-10-CM | POA: Diagnosis not present

## 2023-02-17 DIAGNOSIS — H524 Presbyopia: Secondary | ICD-10-CM | POA: Diagnosis not present

## 2023-02-17 DIAGNOSIS — H5213 Myopia, bilateral: Secondary | ICD-10-CM | POA: Diagnosis not present

## 2023-02-17 DIAGNOSIS — H25813 Combined forms of age-related cataract, bilateral: Secondary | ICD-10-CM | POA: Diagnosis not present

## 2023-02-17 DIAGNOSIS — H52223 Regular astigmatism, bilateral: Secondary | ICD-10-CM | POA: Diagnosis not present

## 2023-02-18 ENCOUNTER — Encounter: Payer: Self-pay | Admitting: Internal Medicine

## 2023-02-18 LAB — UPEP/UIFE/LIGHT CHAINS/TP, 24-HR UR
% BETA, Urine: 37.4 %
ALPHA 1 URINE: 4.2 %
Albumin, U: 28.1 %
Alpha 2, Urine: 19.3 %
Free Kappa Lt Chains,Ur: 3.69 mg/L (ref 1.17–86.46)
Free Kappa/Lambda Ratio: 5.2 (ref 1.83–14.26)
Free Lambda Lt Chains,Ur: 0.71 mg/L (ref 0.27–15.21)
GAMMA GLOBULIN URINE: 11 %
Total Protein, Urine-Ur/day: 37 mg/(24.h) (ref 30–150)
Total Protein, Urine: 9.3 mg/dL
Total Volume: 400

## 2023-03-16 DIAGNOSIS — J441 Chronic obstructive pulmonary disease with (acute) exacerbation: Secondary | ICD-10-CM | POA: Diagnosis not present

## 2023-03-23 ENCOUNTER — Other Ambulatory Visit: Payer: Self-pay | Admitting: Internal Medicine

## 2023-03-23 ENCOUNTER — Other Ambulatory Visit (HOSPITAL_COMMUNITY): Payer: Self-pay | Admitting: Internal Medicine

## 2023-03-23 DIAGNOSIS — Z1231 Encounter for screening mammogram for malignant neoplasm of breast: Secondary | ICD-10-CM

## 2023-03-28 ENCOUNTER — Other Ambulatory Visit: Payer: Self-pay | Admitting: Physician Assistant

## 2023-03-28 DIAGNOSIS — M810 Age-related osteoporosis without current pathological fracture: Secondary | ICD-10-CM

## 2023-03-30 ENCOUNTER — Ambulatory Visit (HOSPITAL_COMMUNITY)
Admission: RE | Admit: 2023-03-30 | Discharge: 2023-03-30 | Disposition: A | Discharge status: Home / Self Care | Source: Ambulatory Visit | Attending: Internal Medicine | Admitting: Internal Medicine

## 2023-03-30 DIAGNOSIS — Z1231 Encounter for screening mammogram for malignant neoplasm of breast: Secondary | ICD-10-CM | POA: Insufficient documentation

## 2023-04-05 ENCOUNTER — Ambulatory Visit

## 2023-04-11 ENCOUNTER — Ambulatory Visit (HOSPITAL_COMMUNITY)
Admission: RE | Admit: 2023-04-11 | Discharge: 2023-04-11 | Disposition: A | Discharge status: Home / Self Care | Payer: Medicare HMO | Source: Ambulatory Visit | Attending: Physician Assistant | Admitting: Physician Assistant

## 2023-04-11 DIAGNOSIS — Z87891 Personal history of nicotine dependence: Secondary | ICD-10-CM | POA: Diagnosis not present

## 2023-04-11 DIAGNOSIS — Z122 Encounter for screening for malignant neoplasm of respiratory organs: Secondary | ICD-10-CM | POA: Insufficient documentation

## 2023-04-11 DIAGNOSIS — F17210 Nicotine dependence, cigarettes, uncomplicated: Secondary | ICD-10-CM | POA: Diagnosis not present

## 2023-04-28 ENCOUNTER — Ambulatory Visit: Admitting: Physician Assistant

## 2023-04-28 NOTE — Progress Notes (Unsigned)
 04/29/2023 Norberta Keens [id number] 22-Jun-1955  Referring provider: Elfredia Nevins, MD Primary GI doctor: Dr. Leonides Schanz  ASSESSMENT AND PLAN:   Bloating 2023 EGD reflux esophagitis, negative for H. pylori gastritis, duodenitis negative celiac 08/2021 colonoscopy 3 mm tubular adenomatous polyp, 2 polyps 3 to 5 mm tubular 1 adenomatous polyp and 1 HP, diverticulosis nonbleeding internal hemorrhoids recall 08/2028 She did not do the SIBO testing due to cost She states she is doing well at this time with bloating, mainly has issues late afternoon/evening Has stopped soda, increasing water -Can do trial of Fdgard -FODMAP and diet discussed -Continue smoking cessation, given information - wants to follow up as needed  GERD On pantoprazole, works better than omeprazole, has less discomfort Does not wear bra due to tightness/uncomfortable No AB pain on exam - continue medications - stop smoking - information given  History of gallstones RUQ [place] 06/16/2021 cholelithiasis without secondary signs of acute cholecystitis 4 mm left renal stone HIDA 08/03/2021 normal gallbladder ejection fraction 84% patent ducts - no pain - low fat diet  COPD/tobacco use CT chest lung cancer screening 03/2022, pending 1 year Has been one week without smoking Did not do well with chantix in the past, has nausea -Given information about chantix/smoking cessation  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 female with a past medical history listed below presents for evaluation of bloating, dyspepsia, GERD.    Last seen in the office 11/02/2022 by Dr. Leonides Schanz.  Discussed the use of AI scribe software for clinical note transcription with the patient, who gave verbal consent to proceed.  History of Present Illness   ANANDA CAYA is a 68 year old female who presents with bloating and discomfort.  She experiences bloating primarily in the late afternoon and evening on most days, though not every day. The bloating is not present at the time of the visit. It causes significant discomfort, particularly under her breasts, making it difficult to wear a bra. She describes a sensation of tightness and difficulty breathing when bloated, which is alleviated somewhat by wearing a larger bra size. When bloated, she prefers to lie down due to discomfort.  She has undergone a gastrointestinal workup including an endoscopy and colonoscopy in 2023, which showed inflammation but were negative for celiac disease. Tubular adenomatous polyps were found. She has not completed a SIBO breath test due to cost concerns.  She is currently taking pantoprazole, which she finds more effective than omeprazole for managing her symptoms. No heartburn is reported, but the medication helps with pain under her breasts.  She has been attempting to quit smoking and has been smoke-free for nearly a week. Smoking can exacerbate her bloating symptoms. She has tried nicotine patches but experiences skin irritation from the adhesive. She has also tried Chantix in the past but discontinued it due to nausea.  In terms of dietary habits, she has reduced her soda intake and is trying to drink more water. She avoids spicy foods and pizza, and does not find that ice cream worsens her bloating. She consumes caffeine-free Sprite and drinks coffee occasionally, which does not seem to  bother her. She is trying to incorporate more fruits like watermelon and cantaloupe into her diet.  No current bloating, heartburn, coughing, wheezing, or shortness of  breath. She reports a scratchy voice, which she attributes to pollen.        She  reports that she has been smoking cigarettes. She quit smokeless tobacco use about 10 years ago. She reports current alcohol use. She reports that she does not use drugs.  RELEVANT GI HISTORY, IMAGING AND LABS: Results   DIAGNOSTIC Endoscopy: Inflammation, negative for celiac disease (2023) Colonoscopy: Inflammation, tubular adenomatous polyps (2023)      CBC    Component Value Date/Time   WBC 7.5 02/02/2023 1349   RBC 4.85 02/02/2023 1349   HGB 14.6 02/02/2023 1349   HGB 14.7 07/04/2021 1529   HCT 44.3 02/02/2023 1349   HCT 43.1 07/04/2021 1529   PLT 400 02/02/2023 1349   PLT 307 07/04/2021 1529   MCV 91.3 02/02/2023 1349   MCV 91 07/04/2021 1529   MCH 30.1 02/02/2023 1349   MCHC 33.0 02/02/2023 1349   RDW 13.4 02/02/2023 1349   RDW 12.8 07/04/2021 1529   LYMPHSABS 2.5 02/02/2023 1349   LYMPHSABS 2.7 07/04/2021 1529   MONOABS 0.5 02/02/2023 1349   EOSABS 0.3 02/02/2023 1349   EOSABS 0.4 07/04/2021 1529   BASOSABS 0.1 02/02/2023 1349   BASOSABS 0.1 07/04/2021 1529   Recent Labs    05/27/22 1319 08/14/22 1529 10/04/22 1337 11/21/22 1728 11/22/22 0428 11/23/22 0416 02/02/23 1349  HGB 13.8 13.8 14.3 15.2* 13.4 13.3 14.6    CMP     Component Value Date/Time   NA 135 02/02/2023 1349   NA CANCELED 07/04/2021 1529   K 3.8 02/02/2023 1349   CL 103 02/02/2023 1349   CO2 25 02/02/2023 1349   GLUCOSE 83 02/02/2023 1349   BUN 16 02/02/2023 1349   BUN CANCELED 07/04/2021 1529   CREATININE 0.83 02/02/2023 1349   CALCIUM 9.2 02/02/2023 1349   PROT 8.7 (H) 02/02/2023 1349   PROT CANCELED 07/04/2021 1529   ALBUMIN 4.3 02/02/2023 1349   ALBUMIN CANCELED 07/04/2021 1529   AST 25 02/02/2023 1349   ALT 20 02/02/2023 1349   ALKPHOS 62 02/02/2023 1349   BILITOT 0.9 02/02/2023 1349   BILITOT CANCELED 07/04/2021 1529   GFRNONAA >60 02/02/2023 1349   GFRAA >60 08/13/2019 0943       Latest Ref Rng & Units 02/02/2023    1:49 PM 11/22/2022    4:28 AM 10/04/2022    1:37 PM  Hepatic Function  Total Protein 6.5 - 8.1 g/dL 8.7  7.9  7.2   Albumin 3.5 - 5.0 g/dL 4.3  3.8  3.4   AST 15 - 41 U/L 25  18  22    ALT 0 - 44 U/L 20  16  30    Alk Phosphatase 38 - 126 U/L 62  52  63   Total Bilirubin 0.0 - 1.2 mg/dL 0.9  0.3  0.6       Current Medications:   Current Outpatient Medications (Endocrine & Metabolic):    alendronate (FOSAMAX) 70 MG tablet, TAKE ONE TABLET (70MG  TOTAL) BY MOUTH ONCE A WEEK. TAKE WITH A FULL GLASS OF WATER ON AN EMPTY STOMACH.  Current Outpatient Medications (Cardiovascular):    rosuvastatin (CRESTOR) 10 MG tablet, Take 1 tablet (10 mg total) by mouth daily.  Current Outpatient Medications (Respiratory):    albuterol (PROVENTIL HFA;VENTOLIN HFA) 108 (90 BASE) MCG/ACT inhaler, Inhale 2 puffs into  the lungs every 6 (six) hours as needed for wheezing or shortness of breath. Reported on 02/06/2015   albuterol (PROVENTIL) (2.5 MG/3ML) 0.083% nebulizer solution, Take 2.5 mg by nebulization every 6 (six) hours as needed for wheezing or shortness of breath.   budesonide-formoterol (SYMBICORT) 160-4.5 MCG/ACT inhaler, Inhale 2 puffs into the lungs in the morning and at bedtime.   ipratropium-albuterol (DUONEB) 0.5-2.5 (3) MG/3ML SOLN, Take 3 mLs by nebulization every 4 (four) hours as needed.  Current Outpatient Medications (Analgesics):    acetaminophen (TYLENOL) 500 MG tablet, Take 500 mg by mouth every 6 (six) hours as needed.   aspirin EC 81 MG tablet, Take 1 tablet (81 mg total) by mouth daily. Swallow whole.   Current Outpatient Medications (Other):    ALPRAZolam (XANAX) 1 MG tablet, Take 1 mg by mouth at bedtime as needed for sleep.   Caraway Oil-Levomenthol (FDGARD) 25-20.75 MG CAPS, Use as directed   cholecalciferol (VITAMIN D3) 25 MCG (1000 UNIT) tablet, Take 2,000 Units by mouth daily.   pantoprazole (PROTONIX) 40 MG tablet, Take 40 mg by mouth  daily.  Medical History:  Past Medical History:  Diagnosis Date   Anxiety    COPD (chronic obstructive pulmonary disease) (HCC)    Gallstones    GERD (gastroesophageal reflux disease)    MGUS (monoclonal gammopathy of unknown significance)    Osteoporosis    Allergies:  Allergies  Allergen Reactions   Avelox [Moxifloxacin Hcl In Nacl] Rash     Surgical History:  She  has a past surgical history that includes Abdominal hysterectomy; Appendectomy; Cesarean section; Bone biopsy (Left, 03/25/2014); Bone marrow aspiration (Left, 03/25/2014); and Bone marrow biopsy (Left, 03/19/2014). Family History:  Her family history includes Arthritis in her mother; Colon cancer in her maternal grandmother; Esophageal cancer in her paternal grandfather; Heart failure in her father.  REVIEW OF SYSTEMS  : All other systems reviewed and negative except where noted in the History of Present Illness.  PHYSICAL EXAM: BP 100/64   Pulse 72   Ht 5\' 1"  (1.549 m)   Wt 98 lb (44.5 kg)   SpO2 97%   BMI 18.52 kg/m  Physical Exam   GENERAL APPEARANCE: Well nourished, in no apparent distress. HEENT: No cervical lymphadenopathy, unremarkable thyroid, sclerae anicteric, conjunctiva pink. RESPIRATORY: Respiratory effort normal, breath sounds clear to auscultation bilaterally without rales, rhonchi, or wheezing. CARDIO: Regular rate and rhythm with no murmurs, rubs, or gallops, peripheral pulses intact. ABDOMEN: Soft, non-distended, active bowel sounds in all four quadrants, no tenderness to palpation, no rebound, no mass appreciated. RECTAL: Declines. MUSCULOSKELETAL: Full range of motion, normal gait, without edema. SKIN: Dry, intact without rashes or lesions. No jaundice. NEURO: Alert, oriented, no focal deficits. PSYCH: Cooperative, normal mood and affect.      Doree Albee, PA-C 10:52 AM

## 2023-04-29 ENCOUNTER — Encounter: Payer: Self-pay | Admitting: Physician Assistant

## 2023-04-29 ENCOUNTER — Ambulatory Visit: Admitting: Physician Assistant

## 2023-04-29 VITALS — BP 100/64 | HR 72 | Ht 61.0 in | Wt 98.0 lb

## 2023-04-29 DIAGNOSIS — Z1211 Encounter for screening for malignant neoplasm of colon: Secondary | ICD-10-CM

## 2023-04-29 DIAGNOSIS — Z860101 Personal history of adenomatous and serrated colon polyps: Secondary | ICD-10-CM

## 2023-04-29 DIAGNOSIS — R140 Abdominal distension (gaseous): Secondary | ICD-10-CM

## 2023-04-29 DIAGNOSIS — K219 Gastro-esophageal reflux disease without esophagitis: Secondary | ICD-10-CM

## 2023-04-29 DIAGNOSIS — J449 Chronic obstructive pulmonary disease, unspecified: Secondary | ICD-10-CM

## 2023-04-29 DIAGNOSIS — K8020 Calculus of gallbladder without cholecystitis without obstruction: Secondary | ICD-10-CM | POA: Diagnosis not present

## 2023-04-29 DIAGNOSIS — Z720 Tobacco use: Secondary | ICD-10-CM | POA: Diagnosis not present

## 2023-04-29 DIAGNOSIS — R1013 Epigastric pain: Secondary | ICD-10-CM

## 2023-04-29 MED ORDER — FDGARD 25-20.75 MG PO CAPS: ORAL_CAPSULE | ORAL | 0 refills | Status: DC

## 2023-04-29 NOTE — Patient Instructions (Addendum)
 We have given you samples of the following medication to take:  FDgard, use as directed.  Thank you for trusting me with your gastrointestinal care!   Quentin Mulling, PA-C   _______________________________________________________  If your blood pressure at your visit was 140/90 or greater, please contact your primary care physician to follow up on this.  _______________________________________________________  If you are age 68 or older, your body mass index should be between 23-30. Your Body mass index is 18.52 kg/m. If this is out of the aforementioned range listed, please consider follow up with your Primary Care Provider.  If you are age 23 or younger, your body mass index should be between 19-25. Your Body mass index is 18.52 kg/m. If this is out of the aformentioned range listed, please consider follow up with your Primary Care Provider.   ________________________________________________________  The  GI providers would like to encourage you to use [facility] to communicate with providers for non-urgent requests or questions.  Due to long hold times on the telephone, sending your provider a message by [facility] may be a faster and more efficient way to get a response.  Please allow 48 business hours for a response.  Please remember that this is for non-urgent requests.  _______________________________________________________    FODMAP stands for fermentable oligo-, di-, mono-saccharides and polyols (1). These are the scientific terms used to classify groups of carbs that are difficult for our body to digest and that are notorious for triggering digestive symptoms like bloating, gas, loose stools and stomach pain.   You can try low FODMAP diet  - start with eliminating just one column at a time that you feel may be a trigger for you. - the table at the very bottom contains foods that are low in FODMAPs   Sometimes trying to eliminate the FODMAP's from your diet is difficult  or tricky, if you are stuggling with trying to do the elimination diet you can try an enzyme.  There is a food enzymes that you sprinkle in or on your food that helps break down the FODMAP. You can read more about the enzyme by going to this site: https://fodzyme.com/  SMOKING CESSATION WITH CHANTIX  [employer]  ([phone] 278-7694 for more information or for a free program for smoking cessation help.   You can call QUIT SMART 1-800-QUIT-NOW for free nicotine patches or replacement therapy- if they are out- keep calling  [facility] Can call for smoking cessation classes, [phone]  If you have a smart phone, please look up Smoke Free app, this will help you stay on track and give you information about money you have saved, life that you have gained back and a ton of more information.   Consider talking with your primary about chantix again but taking it differently You may have heard some scary side effects about chantix, the three most common I hear about are nausea, crazy dreams and depression.  However, I like for my patients to try to stay on 1/2 a tablet twice a day rather than one tablet twice a day as normally prescribed. This helps decrease the chances of side effects and helps save money by making a one month prescription last two months  Please start the prescription this way:  Start 1/2 tablet by mouth once daily after food with a full glass of water for 3 days Then do 1/2 tablet by mouth twice daily for 4 days. During this first week you can smoke, but try to stop  after this week.  At this point we have several options: 1) continue on 1/2 tablet twice a day- which I encourage you to do. You can stay on this dose the rest of the time on the medication or if you still feel the need to smoke you can do one of the two options below. 2) do one tablet in the morning and 1/2 in the evening which helps decrease dreams. 3) do one tablet twice a day.   What if I  miss a dose? If you miss a dose, take it as soon as you can. If it is almost time for your next dose, take only that dose. Do not take double or extra doses.  What should I watch for while using this medicine? Visit your doctor or health care professional for regular check ups. Ask for ongoing advice and encouragement from your doctor or healthcare professional, friends, and family to help you quit. If you smoke while on this medication, quit again  Your mouth may get dry. Chewing sugarless gum or hard candy, and drinking plenty of water may help. Contact your doctor if the problem does not go away or is severe.  You may get drowsy or dizzy. Do not drive, use machinery, or do anything that needs mental alertness until you know how this medicine affects you. Do not stand or sit up quickly, especially if you are an older patient.   The use of this medicine may increase the chance of suicidal thoughts or actions. Pay special attention to how you are responding while on this medicine. Any worsening of mood, or thoughts of suicide or dying should be reported to your health care professional right away.  ADVANTAGES OF QUITTING SMOKING Within 20 minutes, blood pressure decreases. Your pulse is at normal level. After 8 hours, carbon monoxide levels in the blood return to normal. Your oxygen level increases. After 24 hours, the chance of having a heart attack starts to decrease. Your breath, hair, and body stop smelling like smoke. After 48 hours, damaged nerve endings begin to recover. Your sense of taste and smell improve. After 72 hours, the body is virtually free of nicotine. Your bronchial tubes relax and breathing becomes easier. After 2 to 12 weeks, lungs can hold more air. Exercise becomes easier and circulation improves. After 1 year, the risk of coronary heart disease is cut in half. After 5 years, the risk of stroke falls to the same as a nonsmoker. After 10 years, the risk of lung cancer is  cut in half and the risk of other cancers decreases significantly. After 15 years, the risk of coronary heart disease drops, usually to the level of a nonsmoker. You will have extra money to spend on things other than cigarettes.

## 2023-05-02 NOTE — Progress Notes (Signed)
 I agree with the assessment and plan as outlined by Ms. Steffanie Dunn.

## 2023-05-09 NOTE — Progress Notes (Signed)
Patient notified of LDCT Lung Cancer Screening Results via mail with the recommendation to follow-up in 12 months. Patient's referring provider has been sent a copy of results. Results are as follows:   IMPRESSION: Lung-RADS 2, benign appearance or behavior. Continue annual screening with low-dose chest CT without contrast in 12 months.   Aortic Atherosclerosis (ICD10-I70.0) and Emphysema (ICD10-J43.9). 

## 2023-05-31 DIAGNOSIS — F419 Anxiety disorder, unspecified: Secondary | ICD-10-CM | POA: Diagnosis not present

## 2023-05-31 DIAGNOSIS — D472 Monoclonal gammopathy: Secondary | ICD-10-CM | POA: Diagnosis not present

## 2023-05-31 DIAGNOSIS — J449 Chronic obstructive pulmonary disease, unspecified: Secondary | ICD-10-CM | POA: Diagnosis not present

## 2023-06-07 ENCOUNTER — Other Ambulatory Visit: Payer: Medicare HMO

## 2023-06-14 ENCOUNTER — Inpatient Hospital Stay: Payer: Medicare HMO | Attending: Hematology

## 2023-06-14 DIAGNOSIS — D472 Monoclonal gammopathy: Secondary | ICD-10-CM | POA: Insufficient documentation

## 2023-06-14 LAB — COMPREHENSIVE METABOLIC PANEL WITH GFR
ALT: 21 U/L (ref 0–44)
AST: 23 U/L (ref 15–41)
Albumin: 4 g/dL (ref 3.5–5.0)
Alkaline Phosphatase: 74 U/L (ref 38–126)
Anion gap: 8 (ref 5–15)
BUN: 20 mg/dL (ref 8–23)
CO2: 25 mmol/L (ref 22–32)
Calcium: 9.2 mg/dL (ref 8.9–10.3)
Chloride: 105 mmol/L (ref 98–111)
Creatinine, Ser: 0.8 mg/dL (ref 0.44–1.00)
GFR, Estimated: >60 mL/min (ref >60)
Glucose, Bld: 93 mg/dL (ref 70–99)
Potassium: 4.2 mmol/L (ref 3.5–5.1)
Sodium: 138 mmol/L (ref 135–145)
Total Bilirubin: 0.4 mg/dL (ref 0.0–1.2)
Total Protein: 8.3 g/dL — ABNORMAL HIGH (ref 6.5–8.1)

## 2023-06-14 LAB — CBC WITH DIFFERENTIAL/PLATELET
Abs Immature Granulocytes: 0.02 10*3/uL (ref 0.00–0.07)
Basophils Absolute: 0 10*3/uL (ref 0.0–0.1)
Basophils Relative: 0 %
Eosinophils Absolute: 0.6 10*3/uL — ABNORMAL HIGH (ref 0.0–0.5)
Eosinophils Relative: 6 %
HCT: 46 % (ref 36.0–46.0)
Hemoglobin: 14.8 g/dL (ref 12.0–15.0)
Immature Granulocytes: 0 %
Lymphocytes Relative: 31 %
Lymphs Abs: 3 10*3/uL (ref 0.7–4.0)
MCH: 29.7 pg (ref 26.0–34.0)
MCHC: 32.2 g/dL (ref 30.0–36.0)
MCV: 92.4 fL (ref 80.0–100.0)
Monocytes Absolute: 0.7 10*3/uL (ref 0.1–1.0)
Monocytes Relative: 8 %
Neutro Abs: 5.2 10*3/uL (ref 1.7–7.7)
Neutrophils Relative %: 55 %
Platelets: 391 10*3/uL (ref 150–400)
RBC: 4.98 MIL/uL (ref 3.87–5.11)
RDW: 13.8 % (ref 11.5–15.5)
WBC: 9.5 10*3/uL (ref 4.0–10.5)
nRBC: 0 % (ref 0.0–0.2)

## 2023-06-14 LAB — LACTATE DEHYDROGENASE: LDH: 104 U/L (ref 98–192)

## 2023-06-15 LAB — KAPPA/LAMBDA LIGHT CHAINS
Kappa free light chain: 134.9 mg/L — ABNORMAL HIGH (ref 3.3–19.4)
Kappa, lambda light chain ratio: 21.41 — ABNORMAL HIGH (ref 0.26–1.65)
Lambda free light chains: 6.3 mg/L (ref 5.7–26.3)

## 2023-06-15 LAB — PROTEIN ELECTROPHORESIS, SERUM
A/G Ratio: 1.1 (ref 0.7–1.7)
Albumin ELP: 4.1 g/dL (ref 2.9–4.4)
Alpha-1-Globulin: 0.3 g/dL (ref 0.0–0.4)
Alpha-2-Globulin: 0.9 g/dL (ref 0.4–1.0)
Beta Globulin: 0.9 g/dL (ref 0.7–1.3)
Gamma Globulin: 1.8 g/dL (ref 0.4–1.8)
Globulin, Total: 3.9 g/dL (ref 2.2–3.9)
M-Spike, %: 1.6 g/dL — ABNORMAL HIGH
Total Protein ELP: 8 g/dL (ref 6.0–8.5)

## 2023-06-18 NOTE — Progress Notes (Unsigned)
 [facility] [address], [place] [address]   CLINIC:  Medical Oncology/Hematology  PCP:  Kathyleen Parkins, MD [address] [place] [address] [phone]   REASON FOR VISIT:  Follow-up for MGUS   PRIOR THERAPY: None   CURRENT THERAPY: Observation  INTERVAL HISTORY:   Misty Richardson 68 y.o. female returns for routine follow-up of MGUS.  She was last seen by Sheril Dines, PA-C on 02/09/2023. *** She has not had any hospitalizations or interim changes in her health since her last visit.  ***She denies any new bone pain or recent fractures, but has some chronic aching in her left shoulder. *** She does have chronic intermittent left-sided ankle pain due to remote history of left ankle fracture, as well as intermittent back pain related to chronic T7 compression fracture. *** She denies any B symptoms such as fever, chills, or night sweats. ***No new neurologic symptoms such as tinnitus, new-onset hearing loss, blurred vision, headache, or dizziness. *** Denies any numbness or tingling in hands or feet. *** No new masses or lymphadenopathy per her report. *** She denies any unusual fatigue, and reports *** energy. *** She reports that she is maintaining a stable weight, and reports *** appetite.  ***Since her last visit, she has increased her smoking again and is back to smoking 1 PPD.  ASSESSMENT & PLAN:  1.  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 (Previous biopsy in August 2010 showed 3% plasma cells) - Most recent skeletal survey (11/08/2022): No worrisome lytic or sclerotic lesions.  Chronic midthoracic compression fracture, old. - 24-hour urine/UPEP (02/14/2023): Positive for IgG kappa monoclonal protein, but negative for M spike - Myeloma panel (06/14/2023) is essentially stable. M spike 1.6% Elevated FLC ratio 21.41.  (Elevated kappa light chains 134.9,  normal lambda light chain 6.3). Normal LDH No CRAB features (creatinine 0.80, calcium  9.2, Hgb 14.8) - She does not report any new onset bone pains, B symptoms, or recurrent infections     *** - PLAN: No evidence of progression to multiple myeloma at this time -- We will continue to watch closely with repeat MGUS/myeloma panel in 4 months - Next skeletal survey due October 2025 - Annual 24-hour urine/UPEP due January 2026 - Will consider bone marrow biopsy and/or PET if significant progression or development of CRAB signs/symptoms***  2.  Smoking history: - She smoked 1 pack/day for the past 42 years - currently cutting back and trying to quit, smoking about a pack per month - CT lung on 11/24/2019 returned showing a Lung-RADS 2S - LDCT chest (04/11/2023): Lung RADS 2, benign appearance or behavior - PLAN: Continue annual LDCT chest, next due March 2025. *** Smoking counselor?  ***   3.  Osteoporosis: - Bone density test on 08/13/2019 shows T score -3.4. - Most recent bone density/DEXA scan (08/17/2021): Osteoporosis with T-score -3.5 - She is taking Fosamax  weekly since February 2016 and also continue to take calcium  and vitamin D . - Last vitamin D  normal at 58.79 (11/03/2021) - PLAN: Continue Fosamax , calcium , and vitamin D .  We will repeat bone density scan in July 2026. - She will have completed 10 years of treatment with Fosamax  in February 2026    4.  Health maintenance: - Mammogram on 03/08/2022 was BI-RADS Category 1, negative - PLAN: Continue screening mammograms via PCP  PLAN SUMMARY: >> Labs in 4 months = CBC/D, CMP, SPEP, light chains, LDH >>  OFFICE visit in 4 months (1 week after labs) ***     REVIEW OF SYSTEMS:***  Review of Systems  Constitutional:  Positive for fatigue (intermittent). Negative for appetite change, chills, diaphoresis, fever and unexpected weight change.  HENT:   Negative for lump/mass and nosebleeds.   Eyes:  Negative for eye problems.   Respiratory:  Positive for cough and shortness of breath (COPD). Negative for hemoptysis.   Cardiovascular:  Positive for chest pain ("heaviness," come and goes, none today). Negative for leg swelling and palpitations.  Gastrointestinal:  Negative for abdominal pain, blood in stool, constipation, diarrhea, nausea and vomiting.  Genitourinary:  Negative for hematuria.   Musculoskeletal:  Positive for arthralgias (Left shoulder).  Skin: Negative.   Neurological:  Negative for dizziness, headaches and light-headedness.  Hematological:  Does not bruise/bleed easily.  Psychiatric/Behavioral:  Negative for sleep disturbance. The patient is not nervous/anxious (at times).      PHYSICAL EXAM:  ECOG PERFORMANCE STATUS: 1 - Symptomatic but completely ambulatory *** There were no vitals filed for this visit.  There were no vitals filed for this visit.  Physical Exam Constitutional:      Appearance: Normal appearance.  HENT:     Head: Normocephalic and atraumatic.     Mouth/Throat:     Mouth: Mucous membranes are moist.  Eyes:     Extraocular Movements: Extraocular movements intact.     Pupils: Pupils are equal, round, and reactive to light.  Cardiovascular:     Rate and Rhythm: Normal rate and regular rhythm.     Pulses: Normal pulses.     Heart sounds: Normal heart sounds.  Pulmonary:     Effort: Pulmonary effort is normal.     Breath sounds: Wheezing (faint) present.     Comments: Decreased breath sounds in all lung fields Abdominal:     General: Bowel sounds are normal.     Palpations: Abdomen is soft.     Tenderness: There is no abdominal tenderness.  Musculoskeletal:        General: No swelling.     Right lower leg: No edema.     Left lower leg: No edema.  Lymphadenopathy:     Cervical: No cervical adenopathy.  Skin:    General: Skin is warm and dry.  Neurological:     General: No focal deficit present.     Mental Status: She is alert and oriented to person, place, and  time.  Psychiatric:        Mood and Affect: Mood normal.        Behavior: Behavior normal.     PAST MEDICAL/SURGICAL HISTORY:  Past Medical History:  Diagnosis Date   Anxiety    COPD (chronic obstructive pulmonary disease) (HCC)    Gallstones    GERD (gastroesophageal reflux disease)    MGUS (monoclonal gammopathy of unknown significance)    Osteoporosis    Past Surgical History:  Procedure Laterality Date   ABDOMINAL HYSTERECTOMY     APPENDECTOMY     BONE BIOPSY Left 03/25/2014   BONE MARROW ASPIRATION Left 03/25/2014   BONE MARROW BIOPSY Left 03/19/2014   CESAREAN SECTION     x2    SOCIAL HISTORY:  Social History   Socioeconomic History   Marital status: Divorced    Spouse name: Not on file   Number of children: 2   Years of education: Not on file   Highest education level: Not on file  Occupational History   Occupation: Disabled  Tobacco Use  Smoking status: Every Day    Current packs/day: 0.00    Types: Cigarettes    Last attempt to quit: 07/11/2017    Years since quitting: 5.9   Smokeless tobacco: Former    Quit date: 07/31/2012  Vaping Use   Vaping status: Never Used  Substance and Sexual Activity   Alcohol use: Yes    Comment: occasionally   Drug use: No   Sexual activity: Not Currently  Other Topics Concern   Not on file  Social History Narrative   Not on file   Social Drivers of Health   Financial Resource Strain: Not on file  Food Insecurity: No Food Insecurity (11/21/2022)   Hunger Vital Sign    Worried About Running Out of Food in the Last Year: Never true    Ran Out of Food in the Last Year: Never true  Transportation Needs: No Transportation Needs (11/21/2022)   PRAPARE - Administrator, Civil Service (Medical): No    Lack of Transportation (Non-Medical): No  Physical Activity: Not on file  Stress: Not on file  Social Connections: Not on file  Intimate Partner Violence: Not At Risk (11/21/2022)   Humiliation, Afraid, Rape,  and Kick questionnaire    Fear of Current or Ex-Partner: No    Emotionally Abused: No    Physically Abused: No    Sexually Abused: No    FAMILY HISTORY:  Family History  Problem Relation Age of Onset   Arthritis Mother    Heart failure Father    Colon cancer Maternal Grandmother    Esophageal cancer Paternal Grandfather    Stomach cancer Neg Hx    Colon polyps Neg Hx     CURRENT MEDICATIONS:  Outpatient Encounter Medications as of 06/20/2023  Medication Sig Note   acetaminophen  (TYLENOL ) 500 MG tablet Take 500 mg by mouth every 6 (six) hours as needed.    albuterol  (PROVENTIL  HFA;VENTOLIN  HFA) 108 (90 BASE) MCG/ACT inhaler Inhale 2 puffs into the lungs every 6 (six) hours as needed for wheezing or shortness of breath. Reported on 02/06/2015    albuterol  (PROVENTIL ) (2.5 MG/3ML) 0.083% nebulizer solution Take 2.5 mg by nebulization every 6 (six) hours as needed for wheezing or shortness of breath.    alendronate  (FOSAMAX ) 70 MG tablet TAKE ONE TABLET (70MG  TOTAL) BY MOUTH ONCE A WEEK. TAKE WITH A FULL GLASS OF WATER ON AN EMPTY STOMACH.    ALPRAZolam  (XANAX ) 1 MG tablet Take 1 mg by mouth at bedtime as needed for sleep.    aspirin  EC 81 MG tablet Take 1 tablet (81 mg total) by mouth daily. Swallow whole. 11/21/2022: Pt stated time was "before lunch" to be on the safe side, time put down was 11:30 am.   budesonide -formoterol  (SYMBICORT ) 160-4.5 MCG/ACT inhaler Inhale 2 puffs into the lungs in the morning and at bedtime.    Caraway Oil-Levomenthol (FDGARD) 25-20.75 MG CAPS Use as directed    cholecalciferol (VITAMIN D3) 25 MCG (1000 UNIT) tablet Take 2,000 Units by mouth daily.    ipratropium-albuterol  (DUONEB) 0.5-2.5 (3) MG/3ML SOLN Take 3 mLs by nebulization every 4 (four) hours as needed.    pantoprazole  (PROTONIX ) 40 MG tablet Take 40 mg by mouth daily.    rosuvastatin  (CRESTOR ) 10 MG tablet Take 1 tablet (10 mg total) by mouth daily.    No facility-administered encounter medications  on file as of 06/20/2023.    ALLERGIES:  Allergies  Allergen Reactions   Avelox [Moxifloxacin Hcl In Nacl] Rash  LABORATORY DATA:  I have reviewed the labs as listed.  CBC    Component Value Date/Time   WBC 9.5 06/14/2023 1037   RBC 4.98 06/14/2023 1037   HGB 14.8 06/14/2023 1037   HGB 14.7 07/04/2021 1529   HCT 46.0 06/14/2023 1037   HCT 43.1 07/04/2021 1529   PLT 391 06/14/2023 1037   PLT 307 07/04/2021 1529   MCV 92.4 06/14/2023 1037   MCV 91 07/04/2021 1529   MCH 29.7 06/14/2023 1037   MCHC 32.2 06/14/2023 1037   RDW 13.8 06/14/2023 1037   RDW 12.8 07/04/2021 1529   LYMPHSABS 3.0 06/14/2023 1037   LYMPHSABS 2.7 07/04/2021 1529   MONOABS 0.7 06/14/2023 1037   EOSABS 0.6 (H) 06/14/2023 1037   EOSABS 0.4 07/04/2021 1529   BASOSABS 0.0 06/14/2023 1037   BASOSABS 0.1 07/04/2021 1529      Latest Ref Rng & Units 06/14/2023   10:37 AM 02/02/2023    1:49 PM 11/23/2022    4:16 AM  CMP  Glucose 70 - 99 mg/dL 93  83  829   BUN 8 - 23 mg/dL 20  16  23    Creatinine 0.44 - 1.00 mg/dL 5.62  1.30  8.65   Sodium 135 - 145 mmol/L 138  135  133   Potassium 3.5 - 5.1 mmol/L 4.2  3.8  4.4   Chloride 98 - 111 mmol/L 105  103  101   CO2 22 - 32 mmol/L 25  25  25    Calcium  8.9 - 10.3 mg/dL 9.2  9.2  8.4   Total Protein 6.5 - 8.1 g/dL 8.3  8.7    Total Bilirubin 0.0 - 1.2 mg/dL 0.4  0.9    Alkaline Phos 38 - 126 U/L 74  62    AST 15 - 41 U/L 23  25    ALT 0 - 44 U/L 21  20      DIAGNOSTIC IMAGING:  I have independently reviewed the relevant imaging and discussed with the patient.   WRAP UP:  All questions were answered. The patient knows to call the clinic with any problems, questions or concerns.  Medical decision making: Moderate***  Time spent on visit: I spent 20 minutes counseling the patient face to face. The total time spent in the appointment was 30 minutes and more than 50% was on counseling.  Sonnie Dusky, PA-C  ***

## 2023-06-20 ENCOUNTER — Inpatient Hospital Stay: Payer: Medicare HMO | Attending: Hematology | Admitting: Physician Assistant

## 2023-06-20 DIAGNOSIS — D472 Monoclonal gammopathy: Secondary | ICD-10-CM | POA: Insufficient documentation

## 2023-06-20 DIAGNOSIS — F17210 Nicotine dependence, cigarettes, uncomplicated: Secondary | ICD-10-CM | POA: Diagnosis not present

## 2023-06-20 DIAGNOSIS — M810 Age-related osteoporosis without current pathological fracture: Secondary | ICD-10-CM | POA: Diagnosis not present

## 2023-06-20 DIAGNOSIS — Z800 Family history of malignant neoplasm of digestive organs: Secondary | ICD-10-CM | POA: Diagnosis not present

## 2023-06-20 NOTE — Patient Instructions (Signed)
 [facility] at [facility] Discharge Instructions  You were seen today by Sheril Dines PA-C for your MGUS ("monoclonal gammopathy of undetermined significance").   MGUS ("monoclonal gammopathy of undetermined significance") versus "Smoldering Myeloma" As we discussed, this is a "precancerous" condition where you have slightly increased plasma cells making a slightly increased amount of abnormal immunoglobulin proteins. Although MGUS is not causing any current problems in your body, it has a 1% each year risk of progression to multiple myeloma cancer. At this time, your blood work does not show any sign of having progressed to multiple myeloma cancer.  Your blood work is stable.  However, you most likely fall in the "gray area" between MGUS and multiple myeloma, which is referred to as "smoldering myeloma." We will continue to monitor... Labs every 4 months  X-rays once a year (next due October 2025 - after your next visit) 24-hour urine once a year (next due January 2026)  Continue to take Fosamax  once a week for your osteoporosis.  CT chest to screen for lunch cancer will be due again in March 2026.  FOLLOW-UP APPOINTMENT: Office visit in 4 months, after labs   ** Thank you for trusting me with your healthcare!  I strive to provide all of my patients with quality care at each visit.  If you receive a survey for this visit, I would be so grateful to you for taking the time to provide feedback.  Thank you in advance!  ~ Virgilene Stryker                   Dr. Paulett Boros   &   Sheril Dines, PA-C   - - - - - - - - - - - - - - - - - -   Thank you for choosing [facility] at [facility] to provide your oncology and hematology care.  To afford each patient quality time with our provider, please arrive at least 15 minutes before your scheduled appointment time.   If you have a lab appointment with the Cancer Center please come in thru the  Main Entrance and check in at the main information desk.  You need to re-schedule your appointment should you arrive 10 or more minutes late.  We strive to give you quality time with our providers, and arriving late affects you and other patients whose appointments are after yours.  Also, if you no show three or more times for appointments you may be dismissed from the clinic at the providers discretion.     Again, thank you for choosing [facility].  Our hope is that these requests will decrease the amount of time that you wait before being seen by our physicians.       _____________________________________________________________  Should you have questions after your visit to [facility], please contact our office at [phone] and follow the prompts.  Our office hours are 8:00 a.m. and 4:30 p.m. Monday - Friday.  Please note that voicemails left after 4:00 p.m. may not be returned until the following business day.  We are closed weekends and major holidays.  You do have access to a nurse 24-7, just call the main number to the clinic [phone] and do not press any options, hold on the line and a nurse will answer the phone.    For prescription refill requests, have your pharmacy contact our office and allow 72 hours.    Due to  Covid, you will need to wear a mask upon entering the hospital. If you do not have a mask, a mask will be given to you at the Main Entrance upon arrival. For doctor visits, patients may have 1 support person age 93 or older with them. For treatment visits, patients can not have anyone with them due to social distancing guidelines and our immunocompromised population.

## 2023-07-01 DIAGNOSIS — D472 Monoclonal gammopathy: Secondary | ICD-10-CM | POA: Diagnosis not present

## 2023-07-01 DIAGNOSIS — Z681 Body mass index (BMI) 19 or less, adult: Secondary | ICD-10-CM | POA: Diagnosis not present

## 2023-07-01 DIAGNOSIS — F419 Anxiety disorder, unspecified: Secondary | ICD-10-CM | POA: Diagnosis not present

## 2023-07-01 DIAGNOSIS — J441 Chronic obstructive pulmonary disease with (acute) exacerbation: Secondary | ICD-10-CM | POA: Diagnosis not present

## 2023-08-13 ENCOUNTER — Ambulatory Visit
Admission: EM | Admit: 2023-08-13 | Discharge: 2023-08-13 | Disposition: A | Discharge status: Home / Self Care | Attending: Family Medicine | Admitting: Family Medicine

## 2023-08-13 ENCOUNTER — Encounter: Payer: Self-pay | Admitting: Emergency Medicine

## 2023-08-13 ENCOUNTER — Other Ambulatory Visit: Payer: Self-pay

## 2023-08-13 DIAGNOSIS — J441 Chronic obstructive pulmonary disease with (acute) exacerbation: Secondary | ICD-10-CM

## 2023-08-13 MED ORDER — DEXAMETHASONE SODIUM PHOSPHATE 10 MG/ML IJ SOLN
10.0000 mg | Freq: Once | INTRAMUSCULAR | Status: AC
  Administered 2023-08-13: 10 mg via INTRAMUSCULAR

## 2023-08-13 MED ORDER — AZITHROMYCIN 250 MG PO TABS
ORAL_TABLET | ORAL | 0 refills | Status: DC
Start: 2023-08-13 — End: 2023-10-24

## 2023-08-13 NOTE — ED Provider Notes (Signed)
 [facility]    CSN: [id number] Arrival date & time: 08/13/23  1129      History   Chief Complaint Chief Complaint  Patient presents with   Cough    HPI Misty Richardson is a 68 y.o. female.   Patient presenting today with several day history of progressively worsening productive cough, chest congestion, wheezing, shortness of breath, chest tightness.  Denies fever, chills, chest pain, abdominal pain, vomiting, diarrhea.  Has been using some leftover amoxicillin  for the past 3 days and has been using her home nebulizer treatments and inhalers for COPD with mild temporary benefit.    Past Medical History:  Diagnosis Date   Anxiety    COPD (chronic obstructive pulmonary disease) (HCC)    Gallstones    GERD (gastroesophageal reflux disease)    MGUS (monoclonal gammopathy of unknown significance)    Osteoporosis     Patient Active Problem List   Diagnosis Date Noted   Acute exacerbation of chronic obstructive pulmonary disease (COPD) (HCC) 11/21/2022   GERD without esophagitis 11/21/2022   Mixed hyperlipidemia 11/21/2022   Osteoporosis 03/14/2014   Leukocytosis 09/19/2012   Tobacco use disorder 09/19/2012   MGUS (monoclonal gammopathy of unknown significance) 09/05/2012   COPD (chronic obstructive pulmonary disease) (HCC) 09/05/2012    Past Surgical History:  Procedure Laterality Date   ABDOMINAL HYSTERECTOMY     APPENDECTOMY     BONE BIOPSY Left 03/25/2014   BONE MARROW ASPIRATION Left 03/25/2014   BONE MARROW BIOPSY Left 03/19/2014   CESAREAN SECTION     x2    OB History     Gravida  2   Para  2   Term      Preterm      AB      Living  2      SAB      IAB      Ectopic      Multiple      Live Births               Home Medications    Prior to Admission medications   Medication Sig Start Date End Date Taking? Authorizing Provider  azithromycin  (ZITHROMAX ) 250 MG tablet Take first 2 tablets together, then 1 every day  until finished. 08/13/23  Yes Stuart Vernell Norris, PA-C  acetaminophen  (TYLENOL ) 500 MG tablet Take 500 mg by mouth every 6 (six) hours as needed.    [provider]  albuterol  (PROVENTIL  HFA;VENTOLIN  HFA) 108 (90 BASE) MCG/ACT inhaler Inhale 2 puffs into the lungs every 6 (six) hours as needed for wheezing or shortness of breath. Reported on 02/06/2015    [provider]  albuterol  (PROVENTIL ) (2.5 MG/3ML) 0.083% nebulizer solution Take 2.5 mg by nebulization every 6 (six) hours as needed for wheezing or shortness of breath.    [provider]  alendronate  (FOSAMAX ) 70 MG tablet TAKE ONE TABLET (70MG  TOTAL) BY MOUTH ONCE A WEEK. TAKE WITH A FULL GLASS OF WATER ON AN EMPTY STOMACH. 03/28/23   Lamon Herter M, PA-C  ALPRAZolam  (XANAX ) 1 MG tablet Take 1 mg by mouth at bedtime as needed for sleep. 12/02/16   [provider]  aspirin  EC 81 MG tablet Take 1 tablet (81 mg total) by mouth daily. Swallow whole. 03/27/20   Debera Jayson MATSU, MD  budesonide -formoterol  (SYMBICORT ) 160-4.5 MCG/ACT inhaler Inhale 2 puffs into the lungs in the morning and at bedtime. 11/22/22   Von Bellis, MD  cholecalciferol (VITAMIN D3) 25  MCG (1000 UNIT) tablet Take 2,000 Units by mouth daily.    [provider]  ipratropium-albuterol  (DUONEB) 0.5-2.5 (3) MG/3ML SOLN Take 3 mLs by nebulization every 4 (four) hours as needed. 09/17/21   Stuart Vernell Norris, PA-C  pantoprazole  (PROTONIX ) 40 MG tablet Take 40 mg by mouth daily. 02/04/23   [provider]  rosuvastatin  (CRESTOR ) 10 MG tablet Take 1 tablet (10 mg total) by mouth daily. 08/06/22 11/21/22  Debera Jayson MATSU, MD    Family History Family History  Problem Relation Age of Onset   Arthritis Mother    Heart failure Father    Colon cancer Maternal Grandmother    Esophageal cancer Paternal Grandfather    Stomach cancer Neg Hx    Colon polyps Neg Hx     Social History Social History   Tobacco Use    Smoking status: Every Day    Current packs/day: 0.00    Types: Cigarettes    Last attempt to quit: 07/11/2017    Years since quitting: 6.0   Smokeless tobacco: Former    Quit date: 07/31/2012  Vaping Use   Vaping status: Never Used  Substance Use Topics   Alcohol use: Yes    Comment: occasionally   Drug use: No     Allergies   Avelox [moxifloxacin hcl in nacl]   Review of Systems Review of Systems Per HPI  Physical Exam Triage Vital Signs ED Triage Vitals  Encounter Vitals Group     BP 08/13/23 1154 119/78     Girls Systolic BP Percentile --      Girls Diastolic BP Percentile --      Boys Systolic BP Percentile --      Boys Diastolic BP Percentile --      Pulse Rate 08/13/23 1154 92     Resp 08/13/23 1154 20     Temp 08/13/23 1154 98.4 F (36.9 C)     Temp Source 08/13/23 1154 Oral     SpO2 08/13/23 1154 92 %     Weight --      Height --      Head Circumference --      Peak Flow --      Pain Score 08/13/23 1156 0     Pain Loc --      Pain Education --      Exclude from Growth Chart --    No data found.  Updated Vital Signs BP 119/78 (BP Location: Right Arm)   Pulse 92   Temp 98.4 F (36.9 C) (Oral)   Resp 20   SpO2 92%   Visual Acuity Right Eye Distance:   Left Eye Distance:   Bilateral Distance:    Right Eye Near:   Left Eye Near:    Bilateral Near:     Physical Exam Vitals and nursing note reviewed.  Constitutional:      Appearance: Normal appearance.  HENT:     Head: Atraumatic.     Right Ear: Tympanic membrane and external ear normal.     Left Ear: Tympanic membrane and external ear normal.     Nose: Nose normal.     Mouth/Throat:     Mouth: Mucous membranes are moist.     Pharynx: Posterior oropharyngeal erythema present.  Eyes:     Extraocular Movements: Extraocular movements intact.     Conjunctiva/sclera: Conjunctivae normal.  Cardiovascular:     Rate and Rhythm: Normal rate and regular rhythm.     Heart sounds: Normal heart  sounds.  Pulmonary:     Effort: Pulmonary effort is normal.     Breath sounds: Wheezing present.     Comments: Decreased breath sounds throughout Musculoskeletal:        General: Normal range of motion.     Cervical back: Normal range of motion and neck supple.  Skin:    General: Skin is warm and dry.  Neurological:     Mental Status: She is alert and oriented to person, place, and time.  Psychiatric:        Mood and Affect: Mood normal.        Thought Content: Thought content normal.      UC Treatments / Results  Labs (all labs ordered are listed, but only abnormal results are displayed) Labs Reviewed - No data to display  EKG   Radiology No results found.  Procedures Procedures (including critical care time)  Medications Ordered in UC Medications  dexamethasone  (DECADRON ) injection 10 mg (10 mg Intramuscular Given 08/13/23 1304)    Initial Impression / Assessment and Plan / UC Course  I have reviewed the triage vital signs and the nursing notes.  Pertinent labs & imaging results that were available during my care of the patient were reviewed by me and considered in my medical decision making (see chart for details).     Vital signs reassuring today, suspect COPD exacerbation which per chart review she has fairly frequently.  Continue home inhaler and nebulizer regimen as needed, treat with IM Decadron , Zithromax , supportive over-the-counter medications and home care.  Return for worsening symptoms.  Final Clinical Impressions(s) / UC Diagnoses   Final diagnoses:  COPD exacerbation [facility])     Discharge Instructions      We have given you a steroid injection today to help with your breathing exacerbation.  I have also prescribed an antibiotic in case worsening or not improving and you may continue your home inhalers and nebulizer treatments.    ED Prescriptions     Medication Sig Dispense Auth. Provider   azithromycin  (ZITHROMAX ) 250 MG tablet Take first  2 tablets together, then 1 every day until finished. 6 tablet Stuart Vernell Norris, [place]      PDMP not reviewed this encounter.   Stuart Vernell Norris, PA-C 08/13/23 1309

## 2023-08-13 NOTE — ED Triage Notes (Signed)
 Pt reports cough, chest congestion, intermittent shortness of breath for last several days. Pt reports has leftover abx from past infection and started taking those Wednesday as well as using intermittent neb treatment, last one being 1030 this am.

## 2023-08-13 NOTE — Discharge Instructions (Signed)
 We have given you a steroid injection today to help with your breathing exacerbation.  I have also prescribed an antibiotic in case worsening or not improving and you may continue your home inhalers and nebulizer treatments.

## 2023-08-15 ENCOUNTER — Ambulatory Visit: Attending: Cardiology | Admitting: Cardiology

## 2023-08-15 ENCOUNTER — Encounter: Payer: Self-pay | Admitting: Cardiology

## 2023-08-15 VITALS — BP 136/70 | HR 81 | Ht 61.0 in | Wt 95.0 lb

## 2023-08-15 DIAGNOSIS — I2510 Atherosclerotic heart disease of native coronary artery without angina pectoris: Secondary | ICD-10-CM | POA: Diagnosis not present

## 2023-08-15 DIAGNOSIS — E782 Mixed hyperlipidemia: Secondary | ICD-10-CM

## 2023-08-15 NOTE — Patient Instructions (Signed)
 Medication Instructions:  Your physician recommends that you continue on your current medications as directed. Please refer to the Current Medication list given to you today.   Labwork: None today  Testing/Procedures: None today  Follow-Up: 1 year  Any Other Special Instructions Will Be Listed Below (If Applicable).  If you need a refill on your cardiac medications before your next appointment, please call your pharmacy.

## 2023-08-15 NOTE — Progress Notes (Signed)
    Cardiology Office Note  Date: 08/15/2023   ID: Misty Richardson, Misty Richardson 1955/10/10, MRN [id number]  History of Present Illness: Misty Richardson is a 68 y.o. female last seen in July 2024.  She is here for a routine visit.  Reports no exertional chest pain, no palpitations or syncope.  She had an interval screening chest CT in March indicating emphysematous changes with otherwise benign appearance.  She does report trouble with heat and humidity this summer, recent treatment for possible URI with antibiotics and steroids.  She also had follow-up with hematology in June, I reviewed the note.  I went over her medications.  She remains on aspirin  81 mg daily and Crestor  10 mg daily.  Physical Exam: VS:  BP 136/70 (BP Location: Left Arm, Cuff Size: Normal)   Pulse 81   Ht 5' 1 (1.549 m)   Wt 95 lb (43.1 kg)   SpO2 96%   BMI 17.95 kg/m , BMI Body mass index is 17.95 kg/m.  Wt Readings from Last 3 Encounters:  08/15/23 95 lb (43.1 kg)  06/20/23 95 lb 14.4 oz (43.5 kg)  04/29/23 98 lb (44.5 kg)    General: Patient appears comfortable at rest. HEENT: Conjunctiva and lids normal. Neck: Supple, no elevated JVP or carotid bruits. Lungs: Decreased breath sounds with mild expiratory wheeze, nonlabored breathing. Cardiac: Regular rate and rhythm, no S3 or significant systolic murmur, no pericardial rub.  ECG:  An ECG dated 11/21/2022 was personally reviewed today and demonstrated:  Sinus tachycardia with lead motion artifact and fusion beat.  Labwork: May 2024: Cholesterol 155, triglycerides 99, HDL 51, LDL 86 11/23/2022: Magnesium  2.4 06/14/2023: ALT 21; AST 23; BUN 20; Creatinine, Ser 0.80; Hemoglobin 14.8; Platelets 391; Potassium 4.2; Sodium 138   Other Studies Reviewed Today:  No interval cardiac testing for review today.  Assessment and Plan:  1. Coronary and aortic calcification by CT imaging.  Asymptomatic without angina.  She continues on aspirin  81 mg daily and Crestor  10 mg  daily.   2. Mixed hyperlipidemia.  HDL 51 and LDL 86 in May 2024.  Continue Crestor  10 mg daily for now.  She will be establishing with a new PCP following the retirement of Dr. Bertell.   3. COPD.  Continues to follow screening chest CTs with Hematology.  Benign appearance by most recent scan in March.   4. MGUS, followed by Hematology.  Disposition:  Follow up 1 year.  Signed, Jayson JUDITHANN Sierras, M.D., F.A.C.C. [facility] at [facility]

## 2023-08-23 DIAGNOSIS — D472 Monoclonal gammopathy: Secondary | ICD-10-CM | POA: Diagnosis not present

## 2023-08-23 DIAGNOSIS — M48062 Spinal stenosis, lumbar region with neurogenic claudication: Secondary | ICD-10-CM | POA: Diagnosis not present

## 2023-08-23 DIAGNOSIS — J441 Chronic obstructive pulmonary disease with (acute) exacerbation: Secondary | ICD-10-CM | POA: Diagnosis not present

## 2023-08-23 DIAGNOSIS — J449 Chronic obstructive pulmonary disease, unspecified: Secondary | ICD-10-CM | POA: Diagnosis not present

## 2023-08-30 ENCOUNTER — Other Ambulatory Visit: Payer: Self-pay | Admitting: *Deleted

## 2023-09-02 ENCOUNTER — Other Ambulatory Visit: Payer: Self-pay | Admitting: Cardiology

## 2023-10-17 ENCOUNTER — Inpatient Hospital Stay: Attending: Hematology

## 2023-10-17 DIAGNOSIS — D472 Monoclonal gammopathy: Secondary | ICD-10-CM | POA: Insufficient documentation

## 2023-10-17 LAB — COMPREHENSIVE METABOLIC PANEL WITH GFR
ALT: 27 U/L (ref 0–44)
AST: 29 U/L (ref 15–41)
Albumin: 4 g/dL (ref 3.5–5.0)
Alkaline Phosphatase: 61 U/L (ref 38–126)
Anion gap: 10 (ref 5–15)
BUN: 15 mg/dL (ref 8–23)
CO2: 23 mmol/L (ref 22–32)
Calcium: 8.8 mg/dL — ABNORMAL LOW (ref 8.9–10.3)
Chloride: 105 mmol/L (ref 98–111)
Creatinine, Ser: 1.09 mg/dL — ABNORMAL HIGH (ref 0.44–1.00)
GFR, Estimated: 56 mL/min — ABNORMAL LOW (ref >60)
Glucose, Bld: 90 mg/dL (ref 70–99)
Potassium: 3.7 mmol/L (ref 3.5–5.1)
Sodium: 138 mmol/L (ref 135–145)
Total Bilirubin: 0.9 mg/dL (ref 0.0–1.2)
Total Protein: 8 g/dL (ref 6.5–8.1)

## 2023-10-17 LAB — CBC WITH DIFFERENTIAL/PLATELET
Abs Immature Granulocytes: 0.01 K/uL (ref 0.00–0.07)
Basophils Absolute: 0.1 K/uL (ref 0.0–0.1)
Basophils Relative: 1 %
Eosinophils Absolute: 0.5 K/uL (ref 0.0–0.5)
Eosinophils Relative: 7 %
HCT: 43.9 % (ref 36.0–46.0)
Hemoglobin: 14.6 g/dL (ref 12.0–15.0)
Immature Granulocytes: 0 %
Lymphocytes Relative: 29 %
Lymphs Abs: 2 K/uL (ref 0.7–4.0)
MCH: 31 pg (ref 26.0–34.0)
MCHC: 33.3 g/dL (ref 30.0–36.0)
MCV: 93.2 fL (ref 80.0–100.0)
Monocytes Absolute: 0.4 K/uL (ref 0.1–1.0)
Monocytes Relative: 6 %
Neutro Abs: 4 K/uL (ref 1.7–7.7)
Neutrophils Relative %: 57 %
Platelets: 338 K/uL (ref 150–400)
RBC: 4.71 MIL/uL (ref 3.87–5.11)
RDW: 13.6 % (ref 11.5–15.5)
WBC: 7.1 K/uL (ref 4.0–10.5)
nRBC: 0 % (ref 0.0–0.2)

## 2023-10-17 LAB — LACTATE DEHYDROGENASE: LDH: 100 U/L (ref 98–192)

## 2023-10-18 LAB — PROTEIN ELECTROPHORESIS, SERUM
A/G Ratio: 1.1 (ref 0.7–1.7)
Albumin ELP: 3.9 g/dL (ref 2.9–4.4)
Alpha-1-Globulin: 0.3 g/dL (ref 0.0–0.4)
Alpha-2-Globulin: 0.7 g/dL (ref 0.4–1.0)
Beta Globulin: 0.8 g/dL (ref 0.7–1.3)
Gamma Globulin: 1.8 g/dL (ref 0.4–1.8)
Globulin, Total: 3.6 g/dL (ref 2.2–3.9)
M-Spike, %: 1.4 g/dL — ABNORMAL HIGH
Total Protein ELP: 7.5 g/dL (ref 6.0–8.5)

## 2023-10-18 LAB — KAPPA/LAMBDA LIGHT CHAINS
Kappa free light chain: 192.4 mg/L — ABNORMAL HIGH (ref 3.3–19.4)
Kappa, lambda light chain ratio: 29.15 — ABNORMAL HIGH (ref 0.26–1.65)
Lambda free light chains: 6.6 mg/L (ref 5.7–26.3)

## 2023-10-22 NOTE — Progress Notes (Unsigned)
 [facility] [address], [place] [address]   CLINIC:  Medical Oncology/Hematology  PCP:  Bertell Satterfield, MD [address] [place] [address] [phone]   REASON FOR VISIT:  Follow-up for MGUS   PRIOR THERAPY: None   CURRENT THERAPY: Observation  INTERVAL HISTORY:   [name] 68 y.o. female returns for routine follow-up of MGUS.   She was last seen by Pleasant Barefoot, PA-C on 06/20/2023. She has not had any hospitalizations or interim changes in her health since her last visit.  She denies any new bone pain or recent fractures, but has some chronic aching in her left shoulder. She does have chronic intermittent left-sided ankle pain due to remote history of left ankle fracture, as well as intermittent back pain related to chronic T7 compression fracture. She denies any B symptoms such as fever, chills, or night sweats. No new neurologic symptoms such as tinnitus, new-onset hearing loss, blurred vision, headache, or dizziness. Denies any numbness or tingling in hands or feet. No new masses or lymphadenopathy per her report. She denies any unusual fatigue, and reports baseline energy (70%). She reports that she is maintaining a stable weight, and reports good appetite (70%). She continues to smoke 1 PPD.  ASSESSMENT & PLAN:  1.  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 (Previous biopsy in August 2010 showed 3% plasma cells) - Most recent skeletal survey (11/08/2022): No worrisome lytic or sclerotic lesions.  Chronic midthoracic compression fracture, old. - 24-hour urine/UPEP (02/14/2023): Positive for IgG kappa monoclonal protein, but negative for M spike - Myeloma panel (10/17/2023) is essentially stable. M spike 1.4% Elevated FLC ratio 29.15.  (Elevated kappa light chains 192.4, normal lambda light chain 6.6). Normal LDH No CRAB features  (creatinine 1.09, calcium  8.8, Hgb 14.6) - She does not report any new onset bone pains, B symptoms, or recurrent infections   - PLAN: No evidence of progression to multiple myeloma at this time -- We will continue to watch closely with repeat MGUS/myeloma panel in 4 months - We will check PET scan (insurance permitting) for better evaluation of MGUS vs. smoldering myeloma - Annual 24-hour urine/UPEP due January 2026 - Will consider another bone marrow biopsy if significant progression or development of CRAB signs/symptoms  2.  Smoking history: - She smoked 1 pack/day for the past 42 years - currently cutting back and trying to quit, smoking about a pack per month - CT lung on 11/24/2019 returned showing a Lung-RADS 2S - LDCT chest (04/11/2023): Lung RADS 2, benign appearance or behavior - PLAN: Continue annual LDCT chest, next due March 2026. - Discussed resources for smoking cessation, including [place] virtual smoking counselor.  She declined referral, but stated that she would think about it, and sign up online if she decides to proceed   3.  Osteoporosis: - Bone density test on 08/13/2019 shows T score -3.4. - Most recent bone density/DEXA scan (08/17/2021): Osteoporosis with T-score -3.5 - She is taking Fosamax  weekly since February 2016 and also continue to take calcium  and vitamin D . - Last vitamin D  normal at 58.79 (11/03/2021) - PLAN: Continue Fosamax , calcium , and vitamin D .  We will repeat bone density scan in July 2026. - She will have completed 10 years of treatment with Fosamax  in February 2026.  Will discontinue Fosamax  at that time.   PLAN SUMMARY: >> PET scan (next available)  >> 24  hour urine/UPEP in 4 months >> Labs in 4 months = CBC/D, CMP, SPEP, light chains, LDH >> OFFICE visit in 4 months (1 week after labs)    REVIEW OF SYSTEMS:  Review of Systems  Constitutional:  Positive for fatigue (intermittent). Negative for appetite change, chills, diaphoresis,  fever and unexpected weight change.  HENT:   Positive for nosebleeds (occasional). Negative for lump/mass.   Eyes:  Negative for eye problems.  Respiratory:  Positive for cough and shortness of breath (COPD). Negative for hemoptysis.   Cardiovascular:  Negative for chest pain, leg swelling and palpitations.  Gastrointestinal:  Negative for abdominal pain, blood in stool, constipation, diarrhea, nausea and vomiting.  Genitourinary:  Negative for hematuria.   Musculoskeletal:  Negative for arthralgias.  Skin: Negative.   Neurological:  Negative for dizziness, headaches and light-headedness.  Hematological:  Does not bruise/bleed easily.  Psychiatric/Behavioral:  Negative for sleep disturbance. The patient is nervous/anxious (at times).      PHYSICAL EXAM:  ECOG PERFORMANCE STATUS: 1 - Symptomatic but completely ambulatory  Vitals:   10/24/23 1325  BP: 113/78  Pulse: 79  Resp: 18  Temp: (!) 96.7 F (35.9 C)  SpO2: 99%    Filed Weights   10/24/23 1325  Weight: 94 lb 5.7 oz (42.8 kg)    Physical Exam Constitutional:      Appearance: Normal appearance.  Cardiovascular:     Rate and Rhythm: Normal rate and regular rhythm.     Pulses: Normal pulses.     Heart sounds: Normal heart sounds.  Pulmonary:     Effort: Pulmonary effort is normal.     Breath sounds: Wheezing (faint) present.     Comments: Decreased breath sounds in all lung fields Skin:    General: Skin is warm and dry.  Neurological:     General: No focal deficit present.     Mental Status: She is alert and oriented to person, place, and time.  Psychiatric:        Mood and Affect: Mood normal.        Behavior: Behavior normal.     PAST MEDICAL/SURGICAL HISTORY:  Past Medical History:  Diagnosis Date   Anxiety    COPD (chronic obstructive pulmonary disease) (HCC)    Gallstones    GERD (gastroesophageal reflux disease)    MGUS (monoclonal gammopathy of unknown significance)    Osteoporosis    Past  Surgical History:  Procedure Laterality Date   ABDOMINAL HYSTERECTOMY     APPENDECTOMY     BONE BIOPSY Left 03/25/2014   BONE MARROW ASPIRATION Left 03/25/2014   BONE MARROW BIOPSY Left 03/19/2014   CESAREAN SECTION     x2    SOCIAL HISTORY:  Social History   Socioeconomic History   Marital status: Divorced    Spouse name: Not on file   Number of children: 2   Years of education: Not on file   Highest education level: Not on file  Occupational History   Occupation: Disabled  Tobacco Use   Smoking status: Every Day    Current packs/day: 0.00    Types: Cigarettes    Last attempt to quit: 07/11/2017    Years since quitting: 6.2   Smokeless tobacco: Former    Quit date: 07/31/2012  Vaping Use   Vaping status: Never Used  Substance and Sexual Activity   Alcohol use: Not Currently    Comment: occasionally   Drug use: No   Sexual activity: Not Currently  Other Topics Concern  Not on file  Social History Narrative   Not on file   Social Drivers of Health   Financial Resource Strain: Not on file  Food Insecurity: No Food Insecurity (11/21/2022)   Hunger Vital Sign    Worried About Running Out of Food in the Last Year: Never true    Ran Out of Food in the Last Year: Never true  Transportation Needs: No Transportation Needs (11/21/2022)   PRAPARE - Administrator, Civil Service (Medical): No    Lack of Transportation (Non-Medical): No  Physical Activity: Not on file  Stress: Not on file  Social Connections: Not on file  Intimate Partner Violence: Not At Risk (11/21/2022)   Humiliation, Afraid, Rape, and Kick questionnaire    Fear of Current or Ex-Partner: No    Emotionally Abused: No    Physically Abused: No    Sexually Abused: No    FAMILY HISTORY:  Family History  Problem Relation Age of Onset   Arthritis Mother    Heart failure Father    Colon cancer Maternal Grandmother    Esophageal cancer Paternal Grandfather    Stomach cancer Neg Hx    Colon  polyps Neg Hx     CURRENT MEDICATIONS:  Outpatient Encounter Medications as of 10/24/2023  Medication Sig Note   acetaminophen  (TYLENOL ) 500 MG tablet Take 500 mg by mouth every 6 (six) hours as needed.    albuterol  (PROVENTIL  HFA;VENTOLIN  HFA) 108 (90 BASE) MCG/ACT inhaler Inhale 2 puffs into the lungs every 6 (six) hours as needed for wheezing or shortness of breath. Reported on 02/06/2015    albuterol  (PROVENTIL ) (2.5 MG/3ML) 0.083% nebulizer solution Take 2.5 mg by nebulization every 6 (six) hours as needed for wheezing or shortness of breath.    alendronate  (FOSAMAX ) 70 MG tablet TAKE ONE TABLET (70MG  TOTAL) BY MOUTH ONCE A WEEK. TAKE WITH A FULL GLASS OF WATER ON AN EMPTY STOMACH.    ALPRAZolam  (XANAX ) 1 MG tablet Take 1 mg by mouth at bedtime as needed for sleep.    aspirin  EC 81 MG tablet Take 1 tablet (81 mg total) by mouth daily. Swallow whole. 11/21/2022: Pt stated time was before lunch to be on the safe side, time put down was 11:30 am.   budesonide -formoterol  (SYMBICORT ) 160-4.5 MCG/ACT inhaler Inhale 2 puffs into the lungs in the morning and at bedtime.    cholecalciferol (VITAMIN D3) 25 MCG (1000 UNIT) tablet Take 2,000 Units by mouth daily.    ipratropium-albuterol  (DUONEB) 0.5-2.5 (3) MG/3ML SOLN Take 3 mLs by nebulization every 4 (four) hours as needed.    pantoprazole  (PROTONIX ) 40 MG tablet Take 40 mg by mouth daily.    rosuvastatin  (CRESTOR ) 10 MG tablet TAKE ONE TABLET (10MG  TOTAL) BY MOUTH DAILY    [DISCONTINUED] azithromycin  (ZITHROMAX ) 250 MG tablet Take first 2 tablets together, then 1 every day until finished.    No facility-administered encounter medications on file as of 10/24/2023.    ALLERGIES:  Allergies  Allergen Reactions   Avelox [Moxifloxacin Hcl In Nacl] Rash    LABORATORY DATA:  I have reviewed the labs as listed.  CBC    Component Value Date/Time   WBC 7.1 10/17/2023 1410   RBC 4.71 10/17/2023 1410   HGB 14.6 10/17/2023 1410   HGB 14.7  07/04/2021 1529   HCT 43.9 10/17/2023 1410   HCT 43.1 07/04/2021 1529   PLT 338 10/17/2023 1410   PLT 307 07/04/2021 1529   MCV 93.2 10/17/2023 1410  MCV 91 07/04/2021 1529   MCH 31.0 10/17/2023 1410   MCHC 33.3 10/17/2023 1410   RDW 13.6 10/17/2023 1410   RDW 12.8 07/04/2021 1529   LYMPHSABS 2.0 10/17/2023 1410   LYMPHSABS 2.7 07/04/2021 1529   MONOABS 0.4 10/17/2023 1410   EOSABS 0.5 10/17/2023 1410   EOSABS 0.4 07/04/2021 1529   BASOSABS 0.1 10/17/2023 1410   BASOSABS 0.1 07/04/2021 1529      Latest Ref Rng & Units 10/17/2023    2:10 PM 06/14/2023   10:37 AM 02/02/2023    1:49 PM  CMP  Glucose 70 - 99 mg/dL 90  93  83   BUN 8 - 23 mg/dL 15  20  16    Creatinine 0.44 - 1.00 mg/dL 8.90  9.19  9.16   Sodium 135 - 145 mmol/L 138  138  135   Potassium 3.5 - 5.1 mmol/L 3.7  4.2  3.8   Chloride 98 - 111 mmol/L 105  105  103   CO2 22 - 32 mmol/L 23  25  25    Calcium  8.9 - 10.3 mg/dL 8.8  9.2  9.2   Total Protein 6.5 - 8.1 g/dL 8.0  8.3  8.7   Total Bilirubin 0.0 - 1.2 mg/dL 0.9  0.4  0.9   Alkaline Phos 38 - 126 U/L 61  74  62   AST 15 - 41 U/L 29  23  25    ALT 0 - 44 U/L 27  21  20      DIAGNOSTIC IMAGING:  I have independently reviewed the relevant imaging and discussed with the patient.   WRAP UP:  All questions were answered. The patient knows to call the clinic with any problems, questions or concerns.  Medical decision making: Moderate  Time spent on visit: I spent 20 minutes counseling the patient face to face. The total time spent in the appointment was 30 minutes and more than 50% was on counseling.  Pleasant CHRISTELLA Barefoot, PA-C  10/24/23 2:15 PM

## 2023-10-24 ENCOUNTER — Inpatient Hospital Stay: Attending: Hematology | Admitting: Physician Assistant

## 2023-10-24 VITALS — BP 113/78 | HR 79 | Temp 96.7°F | Resp 18 | Wt 94.4 lb

## 2023-10-24 DIAGNOSIS — M8008XA Age-related osteoporosis with current pathological fracture, vertebra(e), initial encounter for fracture: Secondary | ICD-10-CM | POA: Diagnosis not present

## 2023-10-24 DIAGNOSIS — Z87891 Personal history of nicotine dependence: Secondary | ICD-10-CM | POA: Insufficient documentation

## 2023-10-24 DIAGNOSIS — D472 Monoclonal gammopathy: Secondary | ICD-10-CM | POA: Insufficient documentation

## 2023-10-24 NOTE — Patient Instructions (Signed)
 [facility] at [facility] Discharge Instructions  You were seen today by Pleasant Barefoot PA-C for your MGUS (monoclonal gammopathy of undetermined significance).   MGUS (monoclonal gammopathy of undetermined significance) versus Smoldering Myeloma As we discussed, this is a precancerous condition where you have slightly increased plasma cells making a slightly increased amount of abnormal immunoglobulin proteins. Although MGUS is not causing any current problems in your body, it has a 1% each year risk of progression to multiple myeloma cancer. At this time, your blood work does not show any sign of having progressed to multiple myeloma cancer.  Your blood work is stable.  However, you most likely fall in the gray area between MGUS and multiple myeloma, which is referred to as smoldering myeloma. We will continue to monitor... Labs every 4 months  24-hour urine once a year (next due January 2026) Instead of whole body x-rays, we will check a PET scan, which will give us  a better look at any potentially cancerous spots on your bones.  Continue to take Fosamax  once a week for your osteoporosis.  CT chest to screen for lunch cancer will be due again in March 2026.  FOLLOW-UP APPOINTMENT: Office visit in 4 months, after labs  ** Thank you for trusting me with your healthcare!  I strive to provide all of my patients with quality care at each visit.  If you receive a survey for this visit, I would be so grateful to you for taking the time to provide feedback.  Thank you in advance!  ~ Shawnte Demarest                                        Dr. Mickiel Davonna Pleasant Barefoot, PA-C          Delon Hope, NP   - - - - - - - - - - - - - - - - - -     Thank you for choosing [facility] at [facility] to provide your oncology and hematology care.  To afford each patient quality time with our provider, please arrive at least 15 minutes  before your scheduled appointment time.   If you have a lab appointment with the Cancer Center please come in thru the Main Entrance and check in at the main information desk.  You need to re-schedule your appointment should you arrive 10 or more minutes late.  We strive to give you quality time with our providers, and arriving late affects you and other patients whose appointments are after yours.  Also, if you no show three or more times for appointments you may be dismissed from the clinic at the providers discretion.     Again, thank you for choosing [facility].  Our hope is that these requests will decrease the amount of time that you wait before being seen by our physicians.       _____________________________________________________________  Should you have questions after your visit to [facility], please contact our office at [phone] and follow the prompts.  Our office hours are 8:00 a.m. and 4:30 p.m. Monday - Friday.  Please note that voicemails left after 4:00 p.m. may not be returned until the following business day.  We are closed weekends and major holidays.  You do have access to a nurse  24-7, just call the main number to the clinic [phone] and do not press any options, hold on the line and a nurse will answer the phone.    For prescription refill requests, have your pharmacy contact our office and allow 72 hours.    Due to Covid, you will need to wear a mask upon entering the hospital. If you do not have a mask, a mask will be given to you at the Main Entrance upon arrival. For doctor visits, patients may have 1 support person age 28 or older with them. For treatment visits, patients can not have anyone with them due to social distancing guidelines and our immunocompromised population.

## 2023-10-24 NOTE — Addendum Note (Signed)
 Addended by: LAMON HERTER on: 10/24/2023 03:59 PM   Modules accepted: Orders

## 2023-11-01 ENCOUNTER — Telehealth: Payer: Self-pay | Admitting: Physician Assistant

## 2023-11-01 ENCOUNTER — Encounter: Payer: Self-pay | Admitting: Physician Assistant

## 2023-11-03 ENCOUNTER — Other Ambulatory Visit (HOSPITAL_COMMUNITY)

## 2023-11-17 ENCOUNTER — Ambulatory Visit (HOSPITAL_COMMUNITY)
Admission: RE | Admit: 2023-11-17 | Discharge: 2023-11-17 | Disposition: A | Discharge status: Home / Self Care | Source: Ambulatory Visit | Attending: Physician Assistant | Admitting: Physician Assistant

## 2023-11-17 DIAGNOSIS — D472 Monoclonal gammopathy: Secondary | ICD-10-CM | POA: Diagnosis not present

## 2023-11-17 DIAGNOSIS — K8020 Calculus of gallbladder without cholecystitis without obstruction: Secondary | ICD-10-CM | POA: Diagnosis not present

## 2023-11-17 DIAGNOSIS — I700 Atherosclerosis of aorta: Secondary | ICD-10-CM | POA: Diagnosis not present

## 2023-11-17 DIAGNOSIS — J432 Centrilobular emphysema: Secondary | ICD-10-CM | POA: Diagnosis not present

## 2023-11-17 MED ORDER — FLUDEOXYGLUCOSE F - 18 (FDG) INJECTION
5.5200 | Freq: Once | INTRAVENOUS | Status: AC | PRN
  Administered 2023-11-17: 5.52 via INTRAVENOUS

## 2023-11-22 ENCOUNTER — Ambulatory Visit: Payer: Self-pay | Admitting: Physician Assistant

## 2023-11-22 ENCOUNTER — Ambulatory Visit (INDEPENDENT_AMBULATORY_CARE_PROVIDER_SITE_OTHER): Admitting: Physician Assistant

## 2023-11-22 ENCOUNTER — Encounter: Payer: Self-pay | Admitting: Physician Assistant

## 2023-11-22 VITALS — BP 123/75 | HR 70 | Temp 98.5°F | Ht 61.0 in | Wt 95.2 lb

## 2023-11-22 DIAGNOSIS — Z23 Encounter for immunization: Secondary | ICD-10-CM

## 2023-11-22 DIAGNOSIS — E782 Mixed hyperlipidemia: Secondary | ICD-10-CM

## 2023-11-22 DIAGNOSIS — M5412 Radiculopathy, cervical region: Secondary | ICD-10-CM | POA: Insufficient documentation

## 2023-11-22 DIAGNOSIS — I2510 Atherosclerotic heart disease of native coronary artery without angina pectoris: Secondary | ICD-10-CM | POA: Insufficient documentation

## 2023-11-22 DIAGNOSIS — M48062 Spinal stenosis, lumbar region with neurogenic claudication: Secondary | ICD-10-CM | POA: Insufficient documentation

## 2023-11-22 DIAGNOSIS — E559 Vitamin D deficiency, unspecified: Secondary | ICD-10-CM | POA: Insufficient documentation

## 2023-11-22 DIAGNOSIS — R7989 Other specified abnormal findings of blood chemistry: Secondary | ICD-10-CM

## 2023-11-22 DIAGNOSIS — J439 Emphysema, unspecified: Secondary | ICD-10-CM

## 2023-11-22 DIAGNOSIS — D472 Monoclonal gammopathy: Secondary | ICD-10-CM

## 2023-11-22 DIAGNOSIS — Z7689 Persons encountering health services in other specified circumstances: Secondary | ICD-10-CM

## 2023-11-22 MED ORDER — PREDNISONE 20 MG PO TABS: 40.0000 mg | ORAL_TABLET | Freq: Every day | ORAL | 0 refills | Status: AC

## 2023-11-22 NOTE — Progress Notes (Signed)
 PET scan (11/17/23) report reviewed - no evidence of active myeloma bone lesions or plasmacytomas.   TOMI - Please call patient to let her know that PET scan did not show any active myeloma.  We will continue to monitor her MGUS as previously discussed. (She specifically requested phone call w/ results rather than [facility].)  Thank you!  Pleasant CHRISTELLA Barefoot, PA-C 11/22/23 1:26 PM

## 2023-11-22 NOTE — Assessment & Plan Note (Signed)
 Increased dyspnea and wheezing due to cold weather and electric heat. Low suspicion of acute bacterial infection. - Start 5-day course of steroids for increased shortness of breath. - Continue Symbicort  daily and albuterol  as needed. - Continue DuoNeb for nebulizer treatments. - Use humidifier to counteract dryness. - Patient instructed to return to clinic if worsening shortness of breath, chest pain, hypoxia, or other concerns. Patient agreeable to plan.

## 2023-11-22 NOTE — Assessment & Plan Note (Signed)
 Stable. Continue with current management without changes. Discussed healthy diet and lifestyle. Lipid panel today.

## 2023-11-22 NOTE — Progress Notes (Signed)
 Patient notified and verbalized understanding.

## 2023-11-22 NOTE — Progress Notes (Signed)
 New Patient Office Visit  Subjective    Patient ID: Misty Richardson, female    DOB: 10-28-1955  Age: 68 y.o. MRN: [id number]  CC:  Chief Complaint  Patient presents with   New Patient (Initial Visit)    Patient is a new patient.  Patient mentioned she has breathing problems that she wants to address. She said it has been going on since over the weekend.  Patient does smoke and mentioned she can make a pack (20) last for 3 days now.  Patient has used patches before to help quit, but will irritate her skin.     HPI Misty Richardson presents to establish care  Discussed the use of AI scribe software for clinical note transcription with the patient, who gave verbal consent to proceed.  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  She experiences increased shortness of breath today, more than her usual baseline, and more pronounced than over the weekend. She used her nebulizer twice since this morning, which provides some relief. Her daily medications include Symbicort  and albuterol  as needed, with DuoNeb and albuterol  for nebulizer use. Cold weather and electric heat exacerbate her symptoms, and she uses a humidifier to alleviate dryness. She denies chest pain, fever, or other cold and flu symptoms.  She is monitored for MGUS with regular blood work and she reports recent PET scan showed no cancer. She manages osteoporosis with Fosamax  and takes rosuvastatin  for high cholesterol. She follows with a cardiologist for atherosclerosis. Her medications include alprazolam  for sleep, pantoprazole , vitamin D3, and a baby aspirin .   Outpatient Encounter Medications as of 11/22/2023  Medication Sig   acetaminophen  (TYLENOL ) 500 MG tablet Take 500 mg by mouth every 6 (six) hours as needed.   albuterol  (PROVENTIL  HFA;VENTOLIN  HFA) 108 (90 BASE) MCG/ACT inhaler Inhale 2 puffs into the lungs every 6  (six) hours as needed for wheezing or shortness of breath. Reported on 02/06/2015   albuterol  (PROVENTIL ) (2.5 MG/3ML) 0.083% nebulizer solution Take 2.5 mg by nebulization every 6 (six) hours as needed for wheezing or shortness of breath.   alendronate  (FOSAMAX ) 70 MG tablet TAKE ONE TABLET (70MG  TOTAL) BY MOUTH ONCE A WEEK. TAKE WITH A FULL GLASS OF WATER ON AN EMPTY STOMACH.   ALPRAZolam  (XANAX ) 1 MG tablet Take 1 mg by mouth at bedtime as needed for sleep.   aspirin  EC 81 MG tablet Take 1 tablet (81 mg total) by mouth daily. Swallow whole.   budesonide -formoterol  (SYMBICORT ) 160-4.5 MCG/ACT inhaler Inhale 2 puffs into the lungs in the morning and at bedtime.   cholecalciferol (VITAMIN D3) 25 MCG (1000 UNIT) tablet Take 2,000 Units by mouth daily.   ipratropium-albuterol  (DUONEB) 0.5-2.5 (3) MG/3ML SOLN Take 3 mLs by nebulization every 4 (four) hours as needed.   pantoprazole  (PROTONIX ) 40 MG tablet Take 40 mg by mouth daily.   predniSONE  (DELTASONE ) 20 MG tablet Take 2 tablets (40 mg total) by mouth daily for 5 days.   rosuvastatin  (CRESTOR ) 10 MG tablet TAKE ONE TABLET (10MG  TOTAL) BY MOUTH DAILY   No facility-administered encounter medications on file as of 11/22/2023.    Past Medical History:  Diagnosis Date   Anxiety    COPD (chronic obstructive pulmonary disease) (HCC)    Gallstones    GERD (gastroesophageal reflux disease)    MGUS (monoclonal gammopathy of unknown significance)    Osteoporosis  Past Surgical History:  Procedure Laterality Date   APPENDECTOMY     BONE BIOPSY Left 03/25/2014   BONE MARROW ASPIRATION Left 03/25/2014   BONE MARROW BIOPSY Left 03/19/2014   CESAREAN SECTION     x2   TOTAL ABDOMINAL HYSTERECTOMY      Family History  Problem Relation Age of Onset   Arthritis Mother    Heart failure Father    Colon cancer Maternal Grandmother    Esophageal cancer Paternal Grandfather    Stomach cancer Neg Hx    Colon polyps Neg Hx     Social History    Socioeconomic History   Marital status: Divorced    Spouse name: Not on file   Number of children: 2   Years of education: Not on file   Highest education level: Not on file  Occupational History   Occupation: Disabled  Tobacco Use   Smoking status: Every Day    Types: Cigarettes    Start date: 2025   Smokeless tobacco: Former    Quit date: 07/31/2012  Vaping Use   Vaping status: Never Used  Substance and Sexual Activity   Alcohol use: Not Currently    Comment: occasionally   Drug use: No   Sexual activity: Not Currently  Other Topics Concern   Not on file  Social History Narrative   Not on file   Social Drivers of Health   Financial Resource Strain: Not on file  Food Insecurity: No Food Insecurity (11/21/2022)   Hunger Vital Sign    Worried About Running Out of Food in the Last Year: Never true    Ran Out of Food in the Last Year: Never true  Transportation Needs: No Transportation Needs (11/21/2022)   PRAPARE - Administrator, Civil Service (Medical): No    Lack of Transportation (Non-Medical): No  Physical Activity: Not on file  Stress: Not on file  Social Connections: Not on file  Intimate Partner Violence: Not At Risk (11/21/2022)   Humiliation, Afraid, Rape, and Kick questionnaire    Fear of Current or Ex-Partner: No    Emotionally Abused: No    Physically Abused: No    Sexually Abused: No    Review of Systems  Constitutional:  Negative for chills, fever and malaise/fatigue.  HENT:  Negative for congestion.   Eyes:  Negative for blurred vision and double vision.  Respiratory:  Positive for shortness of breath. Negative for cough.   Cardiovascular:  Negative for chest pain and palpitations.  Neurological:  Negative for dizziness and headaches.  Psychiatric/Behavioral:  Negative for depression. The patient is nervous/anxious.       Objective    BP 123/75 (BP Location: Left Arm, Patient Position: Sitting)   Pulse 70   Temp 98.5 F (36.9 C)    Ht 5' 1 (1.549 m)   Wt 95 lb 4 oz (43.2 kg)   SpO2 95%   BMI 18.00 kg/m   Physical Exam Constitutional:      General: She is not in acute distress.    Appearance: Normal appearance. She is normal weight. She is not ill-appearing.  HENT:     Head: Normocephalic and atraumatic.     Mouth/Throat:     Mouth: Mucous membranes are moist.     Pharynx: Oropharynx is clear.  Eyes:     Extraocular Movements: Extraocular movements intact.     Conjunctiva/sclera: Conjunctivae normal.  Cardiovascular:     Rate and Rhythm: Normal rate and regular rhythm.  Heart sounds: Normal heart sounds. No murmur heard. Pulmonary:     Effort: Pulmonary effort is normal.     Breath sounds: Wheezing present. No rhonchi or rales.  Skin:    General: Skin is warm and dry.  Neurological:     General: No focal deficit present.     Mental Status: She is alert and oriented to person, place, and time.  Psychiatric:        Mood and Affect: Mood normal.        Behavior: Behavior normal.       Assessment & Plan:  Encounter to establish care  Chronic obstructive pulmonary disease with emphysema, unspecified emphysema type (HCC) Assessment & Plan: Increased dyspnea and wheezing due to cold weather and electric heat. Low suspicion of acute bacterial infection. - Start 5-day course of steroids for increased shortness of breath. - Continue Symbicort  daily and albuterol  as needed. - Continue DuoNeb for nebulizer treatments. - Use humidifier to counteract dryness. - Patient instructed to return to clinic if worsening shortness of breath, chest pain, hypoxia, or other concerns. Patient agreeable to plan.   Orders: -     predniSONE ; Take 2 tablets (40 mg total) by mouth daily for 5 days.  Dispense: 10 tablet; Refill: 0  Mixed hyperlipidemia Assessment & Plan: Stable. Continue with current management without changes. Discussed healthy diet and lifestyle. Lipid panel today.   Orders: -     Lipid  panel  MGUS (monoclonal gammopathy of unknown significance) Assessment & Plan: Monoclonal gammopathy of undetermined significance (MGUS) MGUS monitored with regular blood work and imaging. Recent PET scan overall reassuring.  - Continue regular follow-up with cancer center.   Elevated serum creatinine -     Basic metabolic panel with GFR  Immunization due -     Flu vaccine HIGH DOSE PF(Fluzone Trivalent) -     Pneumococcal conjugate vaccine 20-valent   Return in about 6 months (around 05/21/2024).   Charmaine Medard Decuir, PA-C

## 2023-11-22 NOTE — Assessment & Plan Note (Signed)
 Monoclonal gammopathy of undetermined significance (MGUS) MGUS monitored with regular blood work and imaging. Recent PET scan overall reassuring.  - Continue regular follow-up with cancer center.

## 2023-11-23 ENCOUNTER — Ambulatory Visit: Payer: Self-pay | Admitting: Physician Assistant

## 2023-11-23 LAB — LIPID PANEL
Chol/HDL Ratio: 2.6 ratio (ref 0.0–4.4)
Cholesterol, Total: 145 mg/dL (ref 100–199)
HDL: 55 mg/dL (ref >39)
LDL Chol Calc (NIH): 72 mg/dL (ref 0–99)
Triglycerides: 99 mg/dL (ref 0–149)
VLDL Cholesterol Cal: 18 mg/dL (ref 5–40)

## 2023-11-23 LAB — BASIC METABOLIC PANEL WITH GFR
BUN/Creatinine Ratio: 27 (ref 12–28)
BUN: 19 mg/dL (ref 8–27)
CO2: 21 mmol/L (ref 20–29)
Calcium: 9.4 mg/dL (ref 8.7–10.3)
Chloride: 105 mmol/L (ref 96–106)
Creatinine, Ser: 0.71 mg/dL (ref 0.57–1.00)
Glucose: 85 mg/dL (ref 70–99)
Potassium: 4.1 mmol/L (ref 3.5–5.2)
Sodium: 138 mmol/L (ref 134–144)
eGFR: 93 mL/min/1.73 (ref >59)

## 2023-12-09 ENCOUNTER — Other Ambulatory Visit: Payer: Self-pay | Admitting: Physician Assistant

## 2023-12-29 ENCOUNTER — Ambulatory Visit
Admission: EM | Admit: 2023-12-29 | Discharge: 2023-12-29 | Disposition: A | Discharge status: Home / Self Care | Attending: Nurse Practitioner | Admitting: Nurse Practitioner

## 2023-12-29 DIAGNOSIS — J441 Chronic obstructive pulmonary disease with (acute) exacerbation: Secondary | ICD-10-CM

## 2023-12-29 DIAGNOSIS — R059 Cough, unspecified: Secondary | ICD-10-CM | POA: Diagnosis not present

## 2023-12-29 LAB — POC SOFIA SARS ANTIGEN FIA: SARS Coronavirus 2 Ag: NEGATIVE

## 2023-12-29 MED ORDER — PREDNISONE 20 MG PO TABS: 20.0000 mg | ORAL_TABLET | Freq: Every day | ORAL | 0 refills | Status: AC

## 2023-12-29 MED ORDER — IPRATROPIUM-ALBUTEROL 0.5-2.5 (3) MG/3ML IN SOLN
3.0000 mL | Freq: Once | RESPIRATORY_TRACT | Status: AC
  Administered 2023-12-29: 3 mL via RESPIRATORY_TRACT

## 2023-12-29 MED ORDER — AMOXICILLIN-POT CLAVULANATE 875-125 MG PO TABS: 1.0000 | ORAL_TABLET | Freq: Two times a day (BID) | ORAL | 0 refills | Status: DC

## 2023-12-29 MED ORDER — METHYLPREDNISOLONE SODIUM SUCC 40 MG IJ SOLR
40.0000 mg | Freq: Once | INTRAMUSCULAR | Status: AC
  Administered 2023-12-29: 40 mg via INTRAMUSCULAR

## 2023-12-29 NOTE — ED Triage Notes (Addendum)
 Pt reports SOB, cough, nasal dryness, has been using a humidifier, hx of COPD. Sx's x 4 days. States she has been using inhaler and nebulizer but has found no relief.

## 2023-12-29 NOTE — ED Provider Notes (Signed)
 [facility]    CSN: [id number] Arrival date & time: 12/29/23  1247      History   Chief Complaint No chief complaint on file.   HPI Misty Richardson is a 68 y.o. female.   The history is provided by the patient.   Patient presents with a 4-day history of cough, wheezing, and shortness of breath.  Denies fever, chills, headache, ear pain, ear drainage, chest pain, abdominal pain, nausea, vomiting, diarrhea, or rash.  Patient reports underlying history of COPD, states she was last treated with steroids in November.  Patient reports that she continues to smoke, states that she is having difficulty quitting.  States that she has not smoked any cigarettes today.  She has been using her inhaler and nebulizer treatments at home with minimal relief.  States she has not used any of her inhalers or nebulizer treatments today.  She denies any obvious close sick contacts.  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   Cervical radiculitis 11/22/2023   GERD without esophagitis 11/21/2022   Mixed hyperlipidemia 11/21/2022   Osteoporosis 03/14/2014   Tobacco use disorder 09/19/2012   MGUS (monoclonal gammopathy of unknown significance) 09/05/2012   COPD (chronic obstructive pulmonary disease) (HCC) 09/05/2012    Past Surgical History:  Procedure Laterality Date   APPENDECTOMY     BONE BIOPSY Left 03/25/2014   BONE MARROW ASPIRATION Left 03/25/2014   BONE MARROW BIOPSY Left 03/19/2014   CESAREAN SECTION     x2   TOTAL ABDOMINAL HYSTERECTOMY      OB History     Gravida  2    Para  2   Term      Preterm      AB      Living  2      SAB      IAB      Ectopic      Multiple      Live Births               Home Medications    Prior to Admission medications  Medication Sig Start Date End Date Taking? Authorizing Provider  acetaminophen  (TYLENOL ) 500 MG tablet Take 500 mg by mouth every 6 (six) hours as needed.    [provider]  albuterol  (PROVENTIL  HFA;VENTOLIN  HFA) 108 (90 BASE) MCG/ACT inhaler Inhale 2 puffs into the lungs every 6 (six) hours as needed for wheezing or shortness of breath. Reported on 02/06/2015    [provider]  albuterol  (PROVENTIL ) (2.5 MG/3ML) 0.083% nebulizer solution Take 2.5 mg by nebulization every 6 (six) hours as needed for wheezing or shortness of breath.    [provider]  alendronate  (FOSAMAX ) 70 MG tablet TAKE ONE TABLET (70MG  TOTAL) BY MOUTH ONCE A WEEK. TAKE WITH A FULL GLASS OF WATER ON AN EMPTY STOMACH. 03/28/23   Lamon Herter M, PA-C  ALPRAZolam  (XANAX ) 1 MG tablet TAKE ONE AND ONE-HALF TABLETS BY MOUTH AT BEDTIME 12/09/23   Grooms, Charmaine, PA-C  aspirin  EC 81 MG tablet Take 1 tablet (81 mg total) by mouth  daily. Swallow whole. 03/27/20   Debera Jayson MATSU, MD  budesonide -formoterol  (SYMBICORT ) 160-4.5 MCG/ACT inhaler INHALE TWO PUFFS INTO THE LUNGS IN THE MORNING AND AT BEDTIME 12/09/23   Grooms, [place], PA-C  cholecalciferol (VITAMIN D3) 25 MCG (1000 UNIT) tablet Take 2,000 Units by mouth daily.    [provider]  ipratropium-albuterol  (DUONEB) 0.5-2.5 (3) MG/3ML SOLN Take 3 mLs by nebulization every 4 (four) hours as needed. 09/17/21   Stuart Vernell Norris, PA-C  pantoprazole  (PROTONIX ) 40 MG tablet Take 40 mg by mouth daily. 02/04/23   [provider]  rosuvastatin  (CRESTOR ) 10 MG tablet TAKE ONE TABLET (10MG  TOTAL) BY MOUTH DAILY 09/02/23   Debera Jayson MATSU, MD    Family History Family History  Problem Relation Age of Onset   Arthritis Mother     Heart failure Father    Colon cancer Maternal Grandmother    Esophageal cancer Paternal Grandfather    Stomach cancer Neg Hx    Colon polyps Neg Hx     Social History Social History[1]   Allergies   Avelox [moxifloxacin hcl in nacl]   Review of Systems Review of Systems Per HPI  Physical Exam Triage Vital Signs ED Triage Vitals  Encounter Vitals Group     BP 12/29/23 1250 135/82     Girls Systolic BP Percentile --      Girls Diastolic BP Percentile --      Boys Systolic BP Percentile --      Boys Diastolic BP Percentile --      Pulse Rate 12/29/23 1250 (!) 114     Resp 12/29/23 1250 (!) 24     Temp 12/29/23 1250 97.7 F (36.5 C)     Temp Source 12/29/23 1250 Oral     SpO2 12/29/23 1250 90 %     Weight --      Height --      Head Circumference --      Peak Flow --      Pain Score 12/29/23 1252 3     Pain Loc --      Pain Education --      Exclude from Growth Chart --    No data found.  Updated Vital Signs BP 135/82 (BP Location: Right Arm)   Pulse (!) 114   Temp 97.7 F (36.5 C) (Oral)   Resp (!) 24   SpO2 90%   Visual Acuity Right Eye Distance:   Left Eye Distance:   Bilateral Distance:    Right Eye Near:   Left Eye Near:    Bilateral Near:     Physical Exam Vitals and nursing note reviewed.  Constitutional:      General: She is not in acute distress.    Appearance: Normal appearance.  HENT:     Head: Normocephalic.  Eyes:     Extraocular Movements: Extraocular movements intact.     Conjunctiva/sclera: Conjunctivae normal.     Pupils: Pupils are equal, round, and reactive to light.  Cardiovascular:     Rate and Rhythm: Normal rate and regular rhythm.     Pulses: Normal pulses.     Heart sounds: Normal heart sounds.  Pulmonary:     Effort: Pulmonary effort is normal.     Breath sounds: Decreased air movement present. Examination of the right-upper field reveals wheezing. Examination of the left-upper field reveals wheezing. Examination of  the right-lower field reveals wheezing. Examination of the left-lower field reveals wheezing. Wheezing present.  Musculoskeletal:  Cervical back: Normal range of motion.  Skin:    General: Skin is warm and dry.  Neurological:     General: No focal deficit present.     Mental Status: She is alert and oriented to person, place, and time.  Psychiatric:        Mood and Affect: Mood normal.        Behavior: Behavior normal.      UC Treatments / Results  Labs (all labs ordered are listed, but only abnormal results are displayed) Labs Reviewed - No data to display  EKG   Radiology No results found.  Procedures Procedures (including critical care time)  Medications Ordered in UC Medications - No data to display  Initial Impression / Assessment and Plan / UC Course  I have reviewed the triage vital signs and the nursing notes.  Pertinent labs & imaging results that were available during my care of the patient were reviewed by me and considered in my medical decision making (see chart for details).  Patient presents with a 4-day history of shortness of breath, wheezing, and cough.  On initial exam, O2 sats were at 90%.  She did demonstrate decreased air movement.  COVID test was negative.  DuoNeb and Solu-Medrol  40 mg IM were administered.  Post DuoNeb, sats improved to 91 to 94%.  Trial ambulation was performed and sats remained between 91 to 92%.  Patient reports that she is feeling better at this time.  Symptoms are consistent with COPD exacerbation.  Patient reports that she does continue to smoke at this time.  Will treat with prednisone  20 mg for the next 7 days, and Augmentin  875/125 mg twice daily for the next 7 days.  Patient advised to continue her nebulizer treatments and inhalers at home.  Patient was given strict ER follow-up precautions.  Patient was advised to follow-up with her primary care physician or with pulmonology within the next 7 to 10 days for reevaluation.   Patient was in agreement with this plan of care and verbalizes understanding.  All questions were answered.  Patient stable for discharge.   Final Clinical Impressions(s) / UC Diagnoses   Final diagnoses:  None   Discharge Instructions   None    ED Prescriptions   None    PDMP not reviewed this encounter.        [1]  Social History Tobacco Use   Smoking status: Every Day    Types: Cigarettes    Start date: 2025   Smokeless tobacco: Former    Quit date: 07/31/2012  Vaping Use   Vaping status: Never Used  Substance Use Topics   Alcohol use: Not Currently    Comment: occasionally   Drug use: No     Gilmer Etta PARAS, NP 12/29/23 2025

## 2023-12-29 NOTE — Discharge Instructions (Signed)
 You were given an injection of Solu-Medrol  40 mg.  Start the prednisone  tomorrow. Take medication as prescribed.  Continue use of your inhalers and nebulizer treatments that you have at home. You may take over-the-counter Tylenol  as needed for pain, fever, or general discomfort. Recommend use of a humidifier in your bedroom at nighttime during sleep and sleeping elevated on pillows while symptoms persist. I would like for you to follow-up with your primary care physician to discuss smoking cessation further.  As discussed, you can also begin using your nicotine patches that you have at home until you are able to see your primary care physician. Go to the emergency department if you experience worsening shortness of breath, difficulty breathing, or if your symptoms worsen. Recommend follow-up with your primary care physician or with pulmonology within the next 7 to 10 days for reevaluation. Follow-up as needed.

## 2024-01-13 ENCOUNTER — Other Ambulatory Visit: Payer: Self-pay | Admitting: Physician Assistant

## 2024-01-16 ENCOUNTER — Encounter: Payer: Self-pay | Admitting: *Deleted

## 2024-01-27 ENCOUNTER — Other Ambulatory Visit: Payer: Self-pay | Admitting: *Deleted

## 2024-01-27 DIAGNOSIS — Z87891 Personal history of nicotine dependence: Secondary | ICD-10-CM

## 2024-01-27 DIAGNOSIS — F17210 Nicotine dependence, cigarettes, uncomplicated: Secondary | ICD-10-CM

## 2024-01-27 DIAGNOSIS — Z122 Encounter for screening for malignant neoplasm of respiratory organs: Secondary | ICD-10-CM

## 2024-02-09 ENCOUNTER — Emergency Department (HOSPITAL_COMMUNITY)

## 2024-02-09 ENCOUNTER — Other Ambulatory Visit: Payer: Self-pay

## 2024-02-09 ENCOUNTER — Encounter (HOSPITAL_COMMUNITY): Payer: Self-pay | Admitting: Emergency Medicine

## 2024-02-09 ENCOUNTER — Observation Stay (HOSPITAL_COMMUNITY)
Admission: EM | Admit: 2024-02-09 | Discharge: 2024-02-11 | Disposition: A | Attending: Internal Medicine | Admitting: Internal Medicine

## 2024-02-09 DIAGNOSIS — I2584 Coronary atherosclerosis due to calcified coronary lesion: Secondary | ICD-10-CM | POA: Insufficient documentation

## 2024-02-09 DIAGNOSIS — D472 Monoclonal gammopathy: Secondary | ICD-10-CM | POA: Insufficient documentation

## 2024-02-09 DIAGNOSIS — F17210 Nicotine dependence, cigarettes, uncomplicated: Secondary | ICD-10-CM | POA: Diagnosis not present

## 2024-02-09 DIAGNOSIS — I2510 Atherosclerotic heart disease of native coronary artery without angina pectoris: Secondary | ICD-10-CM | POA: Insufficient documentation

## 2024-02-09 DIAGNOSIS — K219 Gastro-esophageal reflux disease without esophagitis: Secondary | ICD-10-CM | POA: Insufficient documentation

## 2024-02-09 DIAGNOSIS — M810 Age-related osteoporosis without current pathological fracture: Secondary | ICD-10-CM | POA: Insufficient documentation

## 2024-02-09 DIAGNOSIS — E559 Vitamin D deficiency, unspecified: Secondary | ICD-10-CM | POA: Diagnosis not present

## 2024-02-09 DIAGNOSIS — J441 Chronic obstructive pulmonary disease with (acute) exacerbation: Secondary | ICD-10-CM | POA: Diagnosis not present

## 2024-02-09 DIAGNOSIS — R0602 Shortness of breath: Secondary | ICD-10-CM | POA: Diagnosis present

## 2024-02-09 DIAGNOSIS — J9601 Acute respiratory failure with hypoxia: Secondary | ICD-10-CM | POA: Insufficient documentation

## 2024-02-09 DIAGNOSIS — E782 Mixed hyperlipidemia: Secondary | ICD-10-CM | POA: Diagnosis not present

## 2024-02-09 DIAGNOSIS — M48061 Spinal stenosis, lumbar region without neurogenic claudication: Secondary | ICD-10-CM | POA: Insufficient documentation

## 2024-02-09 HISTORY — DX: Malignant (primary) neoplasm, unspecified: C80.1

## 2024-02-09 LAB — CBC
HCT: 43.7 % (ref 36.0–46.0)
Hemoglobin: 14.6 g/dL (ref 12.0–15.0)
MCH: 30.8 pg (ref 26.0–34.0)
MCHC: 33.4 g/dL (ref 30.0–36.0)
MCV: 92.2 fL (ref 80.0–100.0)
Platelets: 360 K/uL (ref 150–400)
RBC: 4.74 MIL/uL (ref 3.87–5.11)
RDW: 13.1 % (ref 11.5–15.5)
WBC: 9.9 K/uL (ref 4.0–10.5)
nRBC: 0 % (ref 0.0–0.2)

## 2024-02-09 LAB — BASIC METABOLIC PANEL WITH GFR
Anion gap: 13 (ref 5–15)
BUN: 19 mg/dL (ref 8–23)
CO2: 21 mmol/L — ABNORMAL LOW (ref 22–32)
Calcium: 8.8 mg/dL — ABNORMAL LOW (ref 8.9–10.3)
Chloride: 109 mmol/L (ref 98–111)
Creatinine, Ser: 0.64 mg/dL (ref 0.44–1.00)
GFR, Estimated: >60 mL/min
Glucose, Bld: 97 mg/dL (ref 70–99)
Potassium: 4.1 mmol/L (ref 3.5–5.1)
Sodium: 143 mmol/L (ref 135–145)

## 2024-02-09 MED ORDER — ALBUTEROL SULFATE (2.5 MG/3ML) 0.083% IN NEBU
10.0000 mg/h | INHALATION_SOLUTION | Freq: Once | RESPIRATORY_TRACT | Status: AC
  Administered 2024-02-09: 10 mg/h via RESPIRATORY_TRACT
  Filled 2024-02-09: qty 12

## 2024-02-09 NOTE — Progress Notes (Signed)
 Started CAT on patient. Patient insisted on holding mask instead of wearing it. Patient also complaining of nausea, RT gave patient emesis bag.

## 2024-02-09 NOTE — ED Provider Notes (Signed)
 " [place] EMERGENCY DEPARTMENT AT [facility] Provider Note   CSN: [id number] Arrival date & time: 02/09/24  2224     Patient presents with: Shortness of Breath   Misty Richardson is a 69 y.o. female.   The history is provided by the patient.  Patient with history of COPD presents with increasing cough, wheezing, shortness of breath for the past several hours.  She reports increased dyspnea on exertion. She is a current smoker.  No fevers, no hemoptysis.  No chest pain. She reports calling 911, EMS gave her neb treatments and Solu-Medrol  She is not currently on home oxygen.  Patient was found to be hypoxic on room air on EMS arrival    Prior to Admission medications  Medication Sig Start Date End Date Taking? Authorizing Provider  acetaminophen  (TYLENOL ) 500 MG tablet Take 500 mg by mouth every 6 (six) hours as needed.    [provider]  albuterol  (PROVENTIL  HFA;VENTOLIN  HFA) 108 (90 BASE) MCG/ACT inhaler Inhale 2 puffs into the lungs every 6 (six) hours as needed for wheezing or shortness of breath. Reported on 02/06/2015    [provider]  albuterol  (PROVENTIL ) (2.5 MG/3ML) 0.083% nebulizer solution Take 2.5 mg by nebulization every 6 (six) hours as needed for wheezing or shortness of breath.    [provider]  alendronate  (FOSAMAX ) 70 MG tablet TAKE ONE TABLET (70MG  TOTAL) BY MOUTH ONCE A WEEK. TAKE WITH A FULL GLASS OF WATER ON AN EMPTY STOMACH. 03/28/23   Lamon Herter M, PA-C  ALPRAZolam  (XANAX ) 1 MG tablet TAKE ONE AND ONE-HALF TABLET BY MOUTH ATBEDTIME 01/17/24   Grooms, [place], PA-C  amoxicillin -clavulanate (AUGMENTIN ) 875-125 MG tablet Take 1 tablet by mouth every 12 (twelve) hours. 12/29/23   Leath-Warren, Etta PARAS, NP  aspirin  EC 81 MG tablet Take 1 tablet (81 mg total) by mouth daily. Swallow whole. 03/27/20   Debera Jayson MATSU, MD  budesonide -formoterol  (SYMBICORT ) 160-4.5 MCG/ACT inhaler INHALE TWO PUFFS INTO THE LUNGS IN  THE MORNING AND AT BEDTIME 12/09/23   Grooms, Courtney, PA-C  cholecalciferol  (VITAMIN D3) 25 MCG (1000 UNIT) tablet Take 2,000 Units by mouth daily.    [provider]  ipratropium-albuterol  (DUONEB) 0.5-2.5 (3) MG/3ML SOLN Take 3 mLs by nebulization every 4 (four) hours as needed. 09/17/21   Stuart Vernell Norris, PA-C  pantoprazole  (PROTONIX ) 40 MG tablet Take 40 mg by mouth daily. 02/04/23   [provider]  rosuvastatin  (CRESTOR ) 10 MG tablet TAKE ONE TABLET (10MG  TOTAL) BY MOUTH DAILY 09/02/23   Debera Jayson MATSU, MD    Allergies: Avelox [moxifloxacin hcl in nacl]    Review of Systems  Updated Vital Signs BP 123/60   Pulse 97   Temp 97.9 F (36.6 C) (Oral)   Resp 18   Ht 1.549 m (5' 1)   Wt 43 kg   SpO2 97%   BMI 17.91 kg/m   Physical Exam CONSTITUTIONAL: Ill-appearing HEAD: Normocephalic/atraumatic EYES: EOMI/PERRL ENMT: Mucous membranes moist, pursed lip breathing NECK: supple no meningeal signs CV: S1/S2 noted, no murmurs/rubs/gallops noted LUNGS: Tachypneic, wheezing bilaterally ABDOMEN: soft, nontender NEURO: Pt is awake/alert/appropriate, moves all extremitiesx4.  No facial droop.   EXTREMITIES: pulses normal/equal, full ROM, no lower extremity edema SKIN: warm, color normal PSYCH: Anxious  (all labs ordered are listed, but only abnormal results are displayed) Labs Reviewed  BASIC METABOLIC PANEL WITH GFR - Abnormal; Notable for the following components:      Result Value   CO2 21 (*)  Calcium  8.8 (*)    All other components within normal limits  CBC    EKG: EKG Interpretation Date/Time:  Thursday February 09 2024 22:51:28 EST Ventricular Rate:  103 PR Interval:  189 QRS Duration:  97 QT Interval:  342 QTC Calculation: 448 R Axis:   81  Text Interpretation: Sinus tachycardia Right atrial enlargement Borderline right axis deviation Interpretation limited secondary to artifact Confirmed by Midge Golas ([id number]) on 02/09/2024  11:05:47 PM  Radiology: [facility] Chest Portable 1 View Result Date: 02/09/2024 EXAM: 1 VIEW(S) XRAY OF THE CHEST 02/09/2024 11:05:00 PM COMPARISON: 11/21/2022 CLINICAL HISTORY: Shortness of breath. FINDINGS: LUNGS AND PLEURA: Hyperinflation with emphysematous changes. Nipple shadow at the left lung base. No focal pulmonary opacity. No pleural effusion. No pneumothorax. HEART AND MEDIASTINUM: Atherosclerotic plaque. No acute abnormality of the cardiac and mediastinal silhouettes. BONES AND SOFT TISSUES: No acute osseous abnormality. IMPRESSION: 1. No acute findings. 2. Emphysema (ICD10-J43.9). Electronically signed by: Pinkie Pebbles MD 02/09/2024 11:21 PM EST RP Workstation: [id number]     .Critical Care  Performed by: Midge Golas, MD Authorized by: Midge Golas, MD   Critical care provider statement:    Critical care time (minutes):  60   Critical care start time:  02/09/2024 11:20 PM   Critical care end time:  02/10/2024 12:20 AM   Critical care time was exclusive of:  Separately billable procedures and treating other patients   Critical care was necessary to treat or prevent imminent or life-threatening deterioration of the following conditions:  Respiratory failure   Critical care was time spent personally by me on the following activities:  Re-evaluation of patient's condition, pulse oximetry, ordering and review of radiographic studies, ordering and review of laboratory studies, examination of patient, obtaining history from patient or surrogate, evaluation of patient's response to treatment and development of treatment plan with patient or surrogate   I assumed direction of critical care for this patient from another provider in my specialty: no     Care discussed with: admitting provider      Medications Ordered in the ED  albuterol  (PROVENTIL ) (2.5 MG/3ML) 0.083% nebulizer solution (10 mg/hr Nebulization Given 02/09/24 2340)  ipratropium-albuterol  (DUONEB) 0.5-2.5 (3) MG/3ML  nebulizer solution 3 mL (3 mLs Nebulization Given 02/10/24 0153)    Clinical Course as of 02/10/24 0401  Fri Feb 10, 2024  0118 Patient feeling improved after neb treatments, will ambulate and reassess [DW]  0252 Patient did not tolerate ambulation.  She became tachypneic, and hypoxic.  She continues to wheeze. This is after receiving steroids and nebulizer therapies. Patient is likely have an acute exacerbation of COPD  Patient would benefit from admission for continued respiratory treatments.  Patient is agreeable with plan [DW]  0401 Discussed with hospitalist nurse practitioner for admission [DW]    Clinical Course User Index [DW] Midge Golas, MD                                 Medical Decision Making Amount and/or Complexity of Data Reviewed Labs: ordered. Radiology: ordered.  Risk Prescription drug management. Decision regarding hospitalization.   This patient presents to the ED for concern of shortness of breath, this involves an extensive number of treatment options, and is a complaint that carries with it a high risk of complications and morbidity.  The differential diagnosis includes but is not limited to Acute coronary syndrome, pneumonia, acute pulmonary edema, pneumothorax, acute anemia, pulmonary embolism  Comorbidities that complicate the patient evaluation: Patients presentation is complicated by their history of COPD  Social Determinants of Health: Patients ongoing tobacco abuse  increases the complexity of managing their presentation  Additional history obtained: Records reviewed Primary Care Documents  Lab Tests: I Ordered, and personally interpreted labs.  The pertinent results include: Labs overall unremarkable  Imaging Studies ordered: I ordered imaging studies including X-ray chest  I independently visualized and interpreted imaging which showed no acute findings I agree with the radiologist interpretation  Cardiac Monitoring: The patient  was maintained on a cardiac monitor.  I personally viewed and interpreted the cardiac monitor which showed an underlying rhythm of:  sinus tachycardia  Medicines ordered and prescription drug management: I ordered medication including albuterol  treatments for wheezing Reevaluation of the patient after these medicines showed that the patient    stayed the same  Critical Interventions:   nebulizer therapies, admission  Consultations Obtained: I requested consultation with the admitting physician [facility], and discussed  findings as well as pertinent plan - they recommend: Admit  Reevaluation: After the interventions noted above, I reevaluated the patient and found that they have :stayed the same  Complexity of problems addressed: Patients presentation is most consistent with  acute presentation with potential threat to life or bodily function  Disposition: After consideration of the diagnostic results and the patients response to treatment,  I feel that the patent would benefit from admission  .        Final diagnoses:  COPD exacerbation (HCC)  Acute respiratory failure with hypoxia [facility])    ED Discharge Orders     None          Midge Golas, MD 02/10/24 0401  "

## 2024-02-09 NOTE — ED Triage Notes (Signed)
 Pt to ED via [facility] from home c/o SOB tonight.  Hx of COPD.  On EMS arrival pt RA sat 88% and wheezing throughout.  Pt took partial neb treatment at home PTA and given 1 duoneb and 125mg  solumedrol en route with mild relief and placed on 3L [place].  Pt presents pursed lip breathing, tripoding, and tachypnic, A&Ox4, denies pain or recent illness.

## 2024-02-10 DIAGNOSIS — K219 Gastro-esophageal reflux disease without esophagitis: Secondary | ICD-10-CM

## 2024-02-10 DIAGNOSIS — J9601 Acute respiratory failure with hypoxia: Secondary | ICD-10-CM

## 2024-02-10 DIAGNOSIS — F17200 Nicotine dependence, unspecified, uncomplicated: Secondary | ICD-10-CM

## 2024-02-10 DIAGNOSIS — J441 Chronic obstructive pulmonary disease with (acute) exacerbation: Principal | ICD-10-CM

## 2024-02-10 MED ORDER — VITAMIN D 25 MCG (1000 UNIT) PO TABS
2000.0000 [IU] | ORAL_TABLET | Freq: Every day | ORAL | Status: DC
  Administered 2024-02-10 – 2024-02-11 (×2): 2000 [IU] via ORAL
  Filled 2024-02-10 (×3): qty 2

## 2024-02-10 MED ORDER — ONDANSETRON HCL 4 MG PO TABS: 4.0000 mg | ORAL_TABLET | Freq: Four times a day (QID) | ORAL | Status: DC | PRN

## 2024-02-10 MED ORDER — ACETAMINOPHEN 325 MG PO TABS: 650.0000 mg | ORAL_TABLET | Freq: Four times a day (QID) | ORAL | Status: DC | PRN

## 2024-02-10 MED ORDER — IPRATROPIUM-ALBUTEROL 0.5-2.5 (3) MG/3ML IN SOLN
3.0000 mL | RESPIRATORY_TRACT | Status: DC | PRN
  Administered 2024-02-10 – 2024-02-11 (×2): 3 mL via RESPIRATORY_TRACT
  Filled 2024-02-10 (×3): qty 3

## 2024-02-10 MED ORDER — PANTOPRAZOLE SODIUM 40 MG PO TBEC
40.0000 mg | DELAYED_RELEASE_TABLET | Freq: Every day | ORAL | Status: DC
  Administered 2024-02-10 – 2024-02-11 (×2): 40 mg via ORAL
  Filled 2024-02-10 (×3): qty 1

## 2024-02-10 MED ORDER — NICOTINE 7 MG/24HR TD PT24
7.0000 mg | MEDICATED_PATCH | Freq: Every day | TRANSDERMAL | Status: DC
  Filled 2024-02-10 (×3): qty 1

## 2024-02-10 MED ORDER — METHOCARBAMOL 1000 MG/10ML IJ SOLN
500.0000 mg | Freq: Four times a day (QID) | INTRAMUSCULAR | Status: DC | PRN
  Administered 2024-02-10: 500 mg via INTRAVENOUS
  Filled 2024-02-10: qty 10

## 2024-02-10 MED ORDER — ALPRAZOLAM 1 MG PO TABS
1.0000 mg | ORAL_TABLET | Freq: Every day | ORAL | Status: DC
  Administered 2024-02-10 (×2): 1 mg via ORAL
  Filled 2024-02-10: qty 2
  Filled 2024-02-10: qty 1

## 2024-02-10 MED ORDER — KETOROLAC TROMETHAMINE 15 MG/ML IJ SOLN
15.0000 mg | Freq: Four times a day (QID) | INTRAMUSCULAR | Status: DC | PRN
  Administered 2024-02-10: 15 mg via INTRAVENOUS
  Filled 2024-02-10: qty 1

## 2024-02-10 MED ORDER — DOXYCYCLINE HYCLATE 100 MG PO TABS
100.0000 mg | ORAL_TABLET | Freq: Two times a day (BID) | ORAL | Status: DC
  Administered 2024-02-10 – 2024-02-11 (×3): 100 mg via ORAL
  Filled 2024-02-10 (×4): qty 1

## 2024-02-10 MED ORDER — IPRATROPIUM-ALBUTEROL 0.5-2.5 (3) MG/3ML IN SOLN
3.0000 mL | Freq: Once | RESPIRATORY_TRACT | Status: AC
  Administered 2024-02-10: 3 mL via RESPIRATORY_TRACT
  Filled 2024-02-10: qty 3

## 2024-02-10 MED ORDER — ONDANSETRON HCL 4 MG/2ML IJ SOLN: 4.0000 mg | Freq: Four times a day (QID) | INTRAMUSCULAR | Status: DC | PRN

## 2024-02-10 MED ORDER — KETOROLAC TROMETHAMINE 15 MG/ML IJ SOLN
15.0000 mg | Freq: Once | INTRAMUSCULAR | Status: AC
  Administered 2024-02-10: 15 mg via INTRAVENOUS
  Filled 2024-02-10: qty 1

## 2024-02-10 MED ORDER — PREDNISONE 20 MG PO TABS
60.0000 mg | ORAL_TABLET | Freq: Every day | ORAL | Status: DC
  Administered 2024-02-10 – 2024-02-11 (×2): 60 mg via ORAL
  Filled 2024-02-10 (×3): qty 3

## 2024-02-10 MED ORDER — ACETAMINOPHEN 650 MG RE SUPP: 650.0000 mg | Freq: Four times a day (QID) | RECTAL | Status: DC | PRN

## 2024-02-10 MED ORDER — ALUM & MAG HYDROXIDE-SIMETH 200-200-20 MG/5ML PO SUSP
30.0000 mL | Freq: Four times a day (QID) | ORAL | Status: DC | PRN
  Administered 2024-02-10: 30 mL via ORAL
  Filled 2024-02-10: qty 30

## 2024-02-10 MED ORDER — ALPRAZOLAM 0.5 MG PO TABS: 1.0000 mg | ORAL_TABLET | Freq: Every day | ORAL | Status: DC

## 2024-02-10 MED ORDER — ASPIRIN 81 MG PO TBEC
81.0000 mg | DELAYED_RELEASE_TABLET | Freq: Every day | ORAL | Status: DC
  Administered 2024-02-10 – 2024-02-11 (×2): 81 mg via ORAL
  Filled 2024-02-10 (×3): qty 1

## 2024-02-10 NOTE — Progress Notes (Signed)
 Interval events noted.  She complains of shortness of breath even with minimal exertion.  Vital signs stable.  She was still requiring 3 L oxygen via [employer] this morning.  Physical exam significant for decreased air entry bilaterally and bilateral expiratory wheezing.  She is admitted for COPD exacerbation and acute hypoxic respiratory failure.  Continue doxycycline , steroids and bronchodilators.

## 2024-02-10 NOTE — Progress Notes (Signed)
" °   02/10/24 0938  TOC Brief Assessment  Insurance and Status Reviewed  Patient has primary care physician Yes  Home environment has been reviewed From Home  Prior level of function: Independent  Prior/Current Home Services No current home services  Social Drivers of Health Review SDOH reviewed interventions complete  Readmission risk has been reviewed Yes  Transition of care needs transition of care needs identified, TOC will continue to follow (Following for oxygen needs)    CSW will continue to monitor patient advancement through interdisciplinary progression rounds. If new patient transition needs arise, please place a ICM consult.  "

## 2024-02-10 NOTE — Progress Notes (Signed)
 Patient ambulated about 100 feet in hallway. Some shortness of breath noted. O2 still 94% on room air. Patient reapplied oxygen long enough to catch her breath and removed it again. O2 sats remain 93-94% on room air. No shortness of breath at this time.

## 2024-02-10 NOTE — H&P (Signed)
 " History and Physical    Patient: Misty Richardson FMW:[id number] DOB: 04/27/1955 DOA: 02/09/2024 DOS: the patient was seen and examined on 02/10/2024 PCP: Grooms, Charmaine, PA-C  Patient coming from: Home  Chief Complaint:  Chief Complaint  Patient presents with   Shortness of Breath   HPI: Misty Richardson is a 69 y.o. female with medical history significant of COPD, MGUS, lumbar spinal stenosis, mixed hyperlipidemia, GERD, VIT D deficiency, osteoporosis, and coronary artery calcification of CT scan. She presented to  the ED via EMS this evening secondary to progressive increased dyspnea and wheezing throughout the evening at home. She denied fever, hemoptysis, and chest pain. Patient was found to be hypoxic on room air on EMS arrival. EMS gave her neb treatments and Solu-Medrol  She is not currently on home oxygen.   Patient was given aggressive neb therapy for dyspnea which improved somewhat,, however she was unable to ambulate without maintaining adequate oxygenation. She is also reporting onset of back pain since admission that is from chronic lumbar stenosis (newly diagnosed on PET scan) per patient report ED work up, metabolic panel and CBC unremarkable. Chest xray with hyperinflated emphysema, nothing acute. EKG with sinus tachycardia   Review of Systems: As mentioned in the history of present illness. All other systems reviewed and are negative. Past Medical History:  Diagnosis Date   Anxiety    Cancer (HCC)    COPD (chronic obstructive pulmonary disease) (HCC)    Gallstones    GERD (gastroesophageal reflux disease)    MGUS (monoclonal gammopathy of unknown significance)    Osteoporosis    Past Surgical History:  Procedure Laterality Date   APPENDECTOMY     BONE BIOPSY Left 03/25/2014   BONE MARROW ASPIRATION Left 03/25/2014   BONE MARROW BIOPSY Left 03/19/2014   CESAREAN SECTION     x2   TOTAL ABDOMINAL HYSTERECTOMY     Social History:  reports that she has been  smoking cigarettes. She started smoking about 12 months ago. She quit smokeless tobacco use about 11 years ago. She reports that she does not currently use alcohol. She reports that she does not use drugs.  Allergies[1]  Family History  Problem Relation Age of Onset   Arthritis Mother    Heart failure Father    Colon cancer Maternal Grandmother    Esophageal cancer Paternal Grandfather    Stomach cancer Neg Hx    Colon polyps Neg Hx     Prior to Admission medications  Medication Sig Start Date End Date Taking? Authorizing Provider  acetaminophen  (TYLENOL ) 500 MG tablet Take 500 mg by mouth every 6 (six) hours as needed.    [provider]  albuterol  (PROVENTIL  HFA;VENTOLIN  HFA) 108 (90 BASE) MCG/ACT inhaler Inhale 2 puffs into the lungs every 6 (six) hours as needed for wheezing or shortness of breath. Reported on 02/06/2015    [provider]  albuterol  (PROVENTIL ) (2.5 MG/3ML) 0.083% nebulizer solution Take 2.5 mg by nebulization every 6 (six) hours as needed for wheezing or shortness of breath.    [provider]  alendronate  (FOSAMAX ) 70 MG tablet TAKE ONE TABLET (70MG  TOTAL) BY MOUTH ONCE A WEEK. TAKE WITH A FULL GLASS OF WATER ON AN EMPTY STOMACH. 03/28/23   Lamon Herter M, PA-C  ALPRAZolam  (XANAX ) 1 MG tablet TAKE ONE AND ONE-HALF TABLET BY MOUTH ATBEDTIME 01/17/24   Grooms, [place], PA-C  amoxicillin -clavulanate (AUGMENTIN ) 875-125 MG tablet Take 1 tablet by mouth every 12 (twelve) hours. 12/29/23   Leath-Warren,  Etta PARAS, NP  aspirin  EC 81 MG tablet Take 1 tablet (81 mg total) by mouth daily. Swallow whole. 03/27/20   Debera Jayson MATSU, MD  budesonide -formoterol  (SYMBICORT ) 160-4.5 MCG/ACT inhaler INHALE TWO PUFFS INTO THE LUNGS IN THE MORNING AND AT BEDTIME 12/09/23   Grooms, Courtney, PA-C  cholecalciferol  (VITAMIN D3) 25 MCG (1000 UNIT) tablet Take 2,000 Units by mouth daily.    [provider]  ipratropium-albuterol  (DUONEB) 0.5-2.5  (3) MG/3ML SOLN Take 3 mLs by nebulization every 4 (four) hours as needed. 09/17/21   Stuart Vernell Norris, PA-C  pantoprazole  (PROTONIX ) 40 MG tablet Take 40 mg by mouth daily. 02/04/23   [provider]  rosuvastatin  (CRESTOR ) 10 MG tablet TAKE ONE TABLET (10MG  TOTAL) BY MOUTH DAILY 09/02/23   Debera Jayson MATSU, MD    Physical Exam: Vitals:   02/10/24 0215 02/10/24 0230 02/10/24 0300 02/10/24 0404  BP:      Pulse: (!) 119 (!) 114 97   Resp: 20 14 18    Temp:    98.1 F (36.7 C)  TempSrc:    Oral  SpO2: 92% 96% 97%   Weight:      Height:       Physical Exam Vitals and nursing note reviewed.  Constitutional:      Appearance: She is normal weight.  HENT:     Head: Normocephalic and atraumatic.     Mouth/Throat:     Mouth: Mucous membranes are moist.  Eyes:     Extraocular Movements: Extraocular movements intact.     Pupils: Pupils are equal, round, and reactive to light.  Cardiovascular:     Rate and Rhythm: Regular rhythm. Tachycardia present.  Pulmonary:     Effort: Tachypnea present.     Breath sounds: Examination of the right-upper field reveals wheezing. Examination of the left-upper field reveals wheezing. Examination of the right-middle field reveals wheezing. Examination of the left-middle field reveals wheezing. Examination of the right-lower field reveals wheezing. Examination of the left-lower field reveals wheezing. Wheezing present.  Abdominal:     General: Bowel sounds are normal.     Palpations: Abdomen is soft.  Musculoskeletal:     Cervical back: Normal range of motion.  Skin:    General: Skin is warm and dry.     Capillary Refill: Capillary refill takes less than 2 seconds.  Neurological:     General: No focal deficit present.     Mental Status: She is alert.  Psychiatric:        Mood and Affect: Mood is anxious.        Behavior: Behavior normal.     Data Reviewed:  Assessment and Plan: Acute respiratory failure with hypoxia     Acute  exacerbation of COPD Coronary calcification Mixed hyperlipidemia  GERD MGUS Vitamin D  deficiency Lumbar stenosis Tobacco addiction Osteoporosis  Acute respiratory failure with hypoxia     Acute exacerbation of COPD    Inhaled corticosteroids + as needed duonebs    Wean oxygen as able     Prednisone  60 mg daily for 5 days     Doxycycline  100 mg daily for 7 days     Would benefit from pulmonary referral for PFT and closer monitoring of her chronic emphysema  Coronary calcification      Daily baby ASA      Crestor   Mixed hyperlipidemia       Crestor   GERD      Protonix  continued daily      Maalox for  acute sxs        MGUS      Continue follow up with oncology  Vitamin D  deficiency      Continue daily supplement   Lumbar stenosis      Torodol ordered for associated back pain  Tobacco addiction      Reports significant reduction in amount smoke - efforts recognized and encouraged to continue       Nicotine  patch ordered  Osteoporosis      Continue fosamax  and f/u with PCP           Advance Care Planning:   Code Status: Prior full   Consults: none  Family Communication: none at bedside  Severity of Illness: The appropriate patient status for this patient is OBSERVATION. Observation status is judged to be reasonable and necessary in order to provide the required intensity of service to ensure the patient's safety. The patient's presenting symptoms, physical exam findings, and initial radiographic and laboratory data in the context of their medical condition is felt to place them at decreased risk for further clinical deterioration. Furthermore, it is anticipated that the patient will be medically stable for discharge from the hospital within 2 midnights of admission.   Author: Erminio Cone, NP 02/10/2024 4:53 AM  For on call review [email or web address].     [1]  Allergies Allergen Reactions   Avelox [Moxifloxacin Hcl In Nacl] Rash   "

## 2024-02-10 NOTE — Progress Notes (Signed)
 Patient has been without oxygen for an hour and is 95% on room air at this time. No shortness of breath.

## 2024-02-11 DIAGNOSIS — J441 Chronic obstructive pulmonary disease with (acute) exacerbation: Secondary | ICD-10-CM | POA: Diagnosis not present

## 2024-02-11 MED ORDER — DOXYCYCLINE HYCLATE 100 MG PO TABS: 100.0000 mg | ORAL_TABLET | Freq: Two times a day (BID) | ORAL | 0 refills | Status: AC

## 2024-02-11 MED ORDER — PREDNISONE 20 MG PO TABS: 40.0000 mg | ORAL_TABLET | Freq: Every day | ORAL | 0 refills | Status: AC

## 2024-02-11 NOTE — Care Management Obs Status (Signed)
 MEDICARE OBSERVATION STATUS NOTIFICATION   Patient Details  Name: Misty Richardson MRN: [id number] Date of Birth: Jul 21, 1955   Medicare Observation Status Notification Given:  Yes    Ronnald MARLA Sil, RN 02/11/2024, 10:34 AM

## 2024-02-11 NOTE — Discharge Summary (Signed)
 " Physician Discharge Summary   Patient: Misty Richardson MRN: [id number] DOB: Dec 06, 1955  Admit date:     02/09/2024  Discharge date: 02/11/24  Discharge Physician: AIDA CHO   PCP: Mancil Pfeiffer, PA-C   Recommendations at discharge:   Follow-up with PCP in 1 week  Discharge Diagnoses: Principal Problem:   COPD with acute exacerbation (HCC)  Resolved Problems:   * No resolved hospital problems. *  Hospital Course:  Misty Richardson is a 69 y.o. female with medical history significant for COPD, lumbar spinal stenosis, hyperlipidemia, MGUS, GERD, vitamin D  deficiency, osteoporosis, right artery calcification on CT scan, who presented to the hospital because of increasing shortness of breath and wheezing.  When EMS arrived, oxygen saturation was 88% on room air.  She was given nebulized bronchodilators, IV Solu-Medrol  and was placed on 2 L oxygen via [place].  She was brought to the ED for further management.  She was admitted to the hospital for COPD exacerbation and acute hypoxic respiratory failure.    Assessment and Plan:   COPD exacerbation.  She was treated with steroids and bronchodilators.  She will be discharged on prednisone .  Continue bronchodilators at discharge.  She said she has a nebulizer at home.   Acute hypoxic respiratory failure: Resolved.  She is tolerating room air even with activity.   Tobacco use disorder: Counseled to quit smoking cigarettes.   Comorbidities include hyperlipidemia, MGUS, vitamin D  deficiency, lumbar spinal stenosis, coronary artery calcification   Her condition has improved significantly.  She feels better and wants to go home today.  She is deemed stable for discharge.      Consultants: None Procedures performed: None Disposition: Home Diet recommendation:  Cardiac diet DISCHARGE MEDICATION: Allergies as of 02/11/2024       Reactions   Avelox [moxifloxacin Hcl In Nacl] Rash        Medication List     STOP taking  these medications    amoxicillin -clavulanate 875-125 MG tablet Commonly known as: AUGMENTIN        TAKE these medications    acetaminophen  500 MG tablet Commonly known as: TYLENOL  Take 500 mg by mouth every 6 (six) hours as needed.   albuterol  108 (90 Base) MCG/ACT inhaler Commonly known as: VENTOLIN  HFA Inhale 2 puffs into the lungs every 6 (six) hours as needed for wheezing or shortness of breath. Reported on 02/06/2015 What changed: Another medication with the same name was removed. Continue taking this medication, and follow the directions you see here.   alendronate  70 MG tablet Commonly known as: FOSAMAX  TAKE ONE TABLET (70MG  TOTAL) BY MOUTH ONCE A WEEK. TAKE WITH A FULL GLASS OF WATER ON AN EMPTY STOMACH.   ALPRAZolam  1 MG tablet Commonly known as: XANAX  TAKE ONE AND ONE-HALF TABLET BY MOUTH ATBEDTIME   aspirin  EC 81 MG tablet Take 1 tablet (81 mg total) by mouth daily. Swallow whole.   budesonide -formoterol  160-4.5 MCG/ACT inhaler Commonly known as: SYMBICORT  INHALE TWO PUFFS INTO THE LUNGS IN THE MORNING AND AT BEDTIME   cholecalciferol  25 MCG (1000 UNIT) tablet Commonly known as: VITAMIN D3 Take 2,000 Units by mouth daily.   doxycycline  100 MG tablet Commonly known as: VIBRA -TABS Take 1 tablet (100 mg total) by mouth every 12 (twelve) hours.   ipratropium-albuterol  0.5-2.5 (3) MG/3ML Soln Commonly known as: DUONEB Take 3 mLs by nebulization every 4 (four) hours as needed.   pantoprazole  40 MG tablet Commonly known as: PROTONIX  Take 40 mg by mouth daily.  predniSONE  20 MG tablet Commonly known as: DELTASONE  Take 2 tablets (40 mg total) by mouth daily with breakfast for 3 days. Start taking on: February 12, 2024   rosuvastatin  10 MG tablet Commonly known as: CRESTOR  TAKE ONE TABLET (10MG  TOTAL) BY MOUTH DAILY        Discharge Exam: Filed Weights   02/09/24 2243 02/10/24 0521  Weight: 43 kg 42.8 kg   GEN: NAD SKIN: Warm and dry EYES: No  pallor or icterus ENT: MMM CV: RRR PULM: Adequate air entry bilaterally.  Mild occasional wheezing. ABD: soft, ND, NT, +BS CNS: AAO x 3, non focal EXT: No edema or tenderness   Condition at discharge: good  The results of significant diagnostics from this hospitalization (including imaging, microbiology, ancillary and laboratory) are listed below for reference.   Imaging Studies: DG Chest Portable 1 View Result Date: 02/09/2024 EXAM: 1 VIEW(S) XRAY OF THE CHEST 02/09/2024 11:05:00 PM COMPARISON: 11/21/2022 CLINICAL HISTORY: Shortness of breath. FINDINGS: LUNGS AND PLEURA: Hyperinflation with emphysematous changes. Nipple shadow at the left lung base. No focal pulmonary opacity. No pleural effusion. No pneumothorax. HEART AND MEDIASTINUM: Atherosclerotic plaque. No acute abnormality of the cardiac and mediastinal silhouettes. BONES AND SOFT TISSUES: No acute osseous abnormality. IMPRESSION: 1. No acute findings. 2. Emphysema (ICD10-J43.9). Electronically signed by: Pinkie Pebbles MD 02/09/2024 11:21 PM EST RP Workstation: [id number]    Microbiology: Results for orders placed or performed during the hospital encounter of 11/21/22  Resp panel by RT-PCR (RSV, Flu A&B, Covid) Anterior Nasal Swab     Status: None   Collection Time: 11/21/22  5:30 PM   Specimen: Anterior Nasal Swab  Result Value Ref Range Status   SARS Coronavirus 2 by RT PCR NEGATIVE NEGATIVE Final    Comment: (NOTE) SARS-CoV-2 target nucleic acids are NOT DETECTED.  The SARS-CoV-2 RNA is generally detectable in upper respiratory specimens during the acute phase of infection. The lowest concentration of SARS-CoV-2 viral copies this assay can detect is 138 copies/mL. A negative result does not preclude SARS-Cov-2 infection and should not be used as the sole basis for treatment or other patient management decisions. A negative result may occur with  improper specimen collection/handling, submission of specimen other than  nasopharyngeal swab, presence of viral mutation(s) within the areas targeted by this assay, and inadequate number of viral copies(<138 copies/mL). A negative result must be combined with clinical observations, patient history, and epidemiological information. The expected result is Negative.  Fact Sheet for Patients:  bloggercourse.com  Fact Sheet for Healthcare Providers:  seriousbroker.it  This test is no t yet approved or cleared by the [place]  FDA and  has been authorized for detection and/or diagnosis of SARS-CoV-2 by FDA under an Emergency Use Authorization (EUA). This EUA will remain  in effect (meaning this test can be used) for the duration of the COVID-19 declaration under Section 564(b)(1) of the Act, 21 U.S.C.section 360bbb-3(b)(1), unless the authorization is terminated  or revoked sooner.       Influenza A by PCR NEGATIVE NEGATIVE Final   Influenza B by PCR NEGATIVE NEGATIVE Final    Comment: (NOTE) The Xpert Xpress SARS-CoV-2/FLU/RSV plus assay is intended as an aid in the diagnosis of influenza from Nasopharyngeal swab specimens and should not be used as a sole basis for treatment. Nasal washings and aspirates are unacceptable for Xpert Xpress SARS-CoV-2/FLU/RSV testing.  Fact Sheet for Patients: bloggercourse.com  Fact Sheet for Healthcare Providers: seriousbroker.it  This test is not yet approved or cleared by  the [place]  FDA and has been authorized for detection and/or diagnosis of SARS-CoV-2 by FDA under an Emergency Use Authorization (EUA). This EUA will remain in effect (meaning this test can be used) for the duration of the COVID-19 declaration under Section 564(b)(1) of the Act, 21 U.S.C. section 360bbb-3(b)(1), unless the authorization is terminated or revoked.     Resp Syncytial Virus by PCR NEGATIVE NEGATIVE Final    Comment:  (NOTE) Fact Sheet for Patients: bloggercourse.com  Fact Sheet for Healthcare Providers: seriousbroker.it  This test is not yet approved or cleared by the [place]  FDA and has been authorized for detection and/or diagnosis of SARS-CoV-2 by FDA under an Emergency Use Authorization (EUA). This EUA will remain in effect (meaning this test can be used) for the duration of the COVID-19 declaration under Section 564(b)(1) of the Act, 21 U.S.C. section 360bbb-3(b)(1), unless the authorization is terminated or revoked.  Performed at [facility], [address] [place], [place] [address]     Labs: CBC: Recent Labs  Lab 02/09/24 2235  WBC 9.9  HGB 14.6  HCT 43.7  MCV 92.2  PLT 360   Basic Metabolic Panel: Recent Labs  Lab 02/09/24 2235  NA 143  K 4.1  CL 109  CO2 21*  GLUCOSE 97  BUN 19  CREATININE 0.64  CALCIUM  8.8*   Liver Function Tests: No results for input(s): AST, ALT, ALKPHOS, BILITOT, PROT, ALBUMIN in the last 168 hours. CBG: No results for input(s): GLUCAP in the last 168 hours.  Discharge time spent: greater than 30 minutes.  Signed: AIDA CHO, MD [facility] 02/11/2024 "

## 2024-02-17 ENCOUNTER — Encounter: Payer: Self-pay | Admitting: Physician Assistant

## 2024-02-17 ENCOUNTER — Ambulatory Visit (INDEPENDENT_AMBULATORY_CARE_PROVIDER_SITE_OTHER): Admitting: Physician Assistant

## 2024-02-17 VITALS — BP 123/76 | HR 88 | Temp 98.1°F | Ht 61.0 in | Wt 94.8 lb

## 2024-02-17 DIAGNOSIS — J439 Emphysema, unspecified: Secondary | ICD-10-CM | POA: Diagnosis not present

## 2024-02-22 ENCOUNTER — Other Ambulatory Visit: Payer: Self-pay | Admitting: Physician Assistant

## 2024-02-27 ENCOUNTER — Inpatient Hospital Stay: Attending: Hematology

## 2024-03-05 ENCOUNTER — Inpatient Hospital Stay: Admitting: Physician Assistant

## 2024-05-21 ENCOUNTER — Ambulatory Visit: Admitting: Family Medicine
